# Patient Record
Sex: Male | Born: 1945 | Race: White | Hispanic: No | Marital: Married | Smoking: Never smoker
Health system: Southern US, Community
[De-identification: ages and names within clinical notes are randomized; demographics above are authoritative.]

## PROBLEM LIST (undated history)

## (undated) DIAGNOSIS — A809 Acute poliomyelitis, unspecified: Secondary | ICD-10-CM

## (undated) DIAGNOSIS — I2510 Atherosclerotic heart disease of native coronary artery without angina pectoris: Secondary | ICD-10-CM

## (undated) DIAGNOSIS — C679 Malignant neoplasm of bladder, unspecified: Secondary | ICD-10-CM

## (undated) DIAGNOSIS — I4729 Other ventricular tachycardia: Secondary | ICD-10-CM

## (undated) DIAGNOSIS — K219 Gastro-esophageal reflux disease without esophagitis: Secondary | ICD-10-CM

## (undated) DIAGNOSIS — G589 Mononeuropathy, unspecified: Secondary | ICD-10-CM

## (undated) DIAGNOSIS — J302 Other seasonal allergic rhinitis: Secondary | ICD-10-CM

## (undated) DIAGNOSIS — I10 Essential (primary) hypertension: Secondary | ICD-10-CM

## (undated) DIAGNOSIS — I499 Cardiac arrhythmia, unspecified: Secondary | ICD-10-CM

## (undated) DIAGNOSIS — Z87442 Personal history of urinary calculi: Secondary | ICD-10-CM

## (undated) DIAGNOSIS — R1013 Epigastric pain: Secondary | ICD-10-CM

## (undated) DIAGNOSIS — N529 Male erectile dysfunction, unspecified: Secondary | ICD-10-CM

## (undated) DIAGNOSIS — E669 Obesity, unspecified: Secondary | ICD-10-CM

## (undated) DIAGNOSIS — M7551 Bursitis of right shoulder: Secondary | ICD-10-CM

## (undated) DIAGNOSIS — L989 Disorder of the skin and subcutaneous tissue, unspecified: Secondary | ICD-10-CM

## (undated) DIAGNOSIS — E785 Hyperlipidemia, unspecified: Secondary | ICD-10-CM

## (undated) DIAGNOSIS — M109 Gout, unspecified: Secondary | ICD-10-CM

## (undated) DIAGNOSIS — R351 Nocturia: Secondary | ICD-10-CM

## (undated) DIAGNOSIS — Z951 Presence of aortocoronary bypass graft: Secondary | ICD-10-CM

## (undated) DIAGNOSIS — N200 Calculus of kidney: Secondary | ICD-10-CM

## (undated) DIAGNOSIS — K449 Diaphragmatic hernia without obstruction or gangrene: Secondary | ICD-10-CM

## (undated) DIAGNOSIS — M5136 Other intervertebral disc degeneration, lumbar region: Secondary | ICD-10-CM

## (undated) DIAGNOSIS — I4891 Unspecified atrial fibrillation: Secondary | ICD-10-CM

## (undated) DIAGNOSIS — M51369 Other intervertebral disc degeneration, lumbar region without mention of lumbar back pain or lower extremity pain: Secondary | ICD-10-CM

## (undated) DIAGNOSIS — I472 Ventricular tachycardia: Secondary | ICD-10-CM

## (undated) DIAGNOSIS — I480 Paroxysmal atrial fibrillation: Secondary | ICD-10-CM

## (undated) HISTORY — DX: Mononeuropathy, unspecified: G58.9

## (undated) HISTORY — DX: Epigastric pain: R10.13

## (undated) HISTORY — PX: SHOULDER SURGERY: SHX246

## (undated) HISTORY — DX: Male erectile dysfunction, unspecified: N52.9

## (undated) HISTORY — DX: Unspecified atrial fibrillation: I48.91

## (undated) HISTORY — DX: Other ventricular tachycardia: I47.29

## (undated) HISTORY — DX: Nocturia: R35.1

## (undated) HISTORY — DX: Other seasonal allergic rhinitis: J30.2

## (undated) HISTORY — DX: Ventricular tachycardia: I47.2

## (undated) HISTORY — PX: UPPER GI ENDOSCOPY: SHX6162

## (undated) HISTORY — DX: Obesity, unspecified: E66.9

## (undated) HISTORY — DX: Paroxysmal atrial fibrillation: I48.0

## (undated) HISTORY — DX: Diaphragmatic hernia without obstruction or gangrene: K44.9

## (undated) HISTORY — DX: Gout, unspecified: M10.9

## (undated) HISTORY — PX: CARDIAC CATHETERIZATION: SHX172

## (undated) HISTORY — PX: COLONOSCOPY: SHX174

## (undated) HISTORY — PX: EYE SURGERY: SHX253

---

## 1994-12-27 HISTORY — PX: EYE SURGERY: SHX253

## 2000-06-08 ENCOUNTER — Encounter: Admission: RE | Admit: 2000-06-08 | Discharge: 2000-06-08 | Payer: Self-pay | Admitting: Cardiology

## 2000-06-08 ENCOUNTER — Encounter: Payer: Self-pay | Admitting: Cardiology

## 2000-06-09 ENCOUNTER — Ambulatory Visit (HOSPITAL_COMMUNITY): Admission: RE | Admit: 2000-06-09 | Discharge: 2000-06-09 | Payer: Self-pay | Admitting: Cardiology

## 2005-01-21 ENCOUNTER — Ambulatory Visit: Payer: Self-pay | Admitting: Internal Medicine

## 2005-01-27 ENCOUNTER — Ambulatory Visit: Payer: Self-pay | Admitting: Internal Medicine

## 2005-02-01 ENCOUNTER — Ambulatory Visit (HOSPITAL_COMMUNITY): Admission: RE | Admit: 2005-02-01 | Discharge: 2005-02-01 | Payer: Self-pay | Admitting: Internal Medicine

## 2005-06-08 ENCOUNTER — Ambulatory Visit: Payer: Self-pay | Admitting: Internal Medicine

## 2005-10-05 ENCOUNTER — Ambulatory Visit: Payer: Self-pay | Admitting: Internal Medicine

## 2008-11-26 ENCOUNTER — Encounter: Payer: Self-pay | Admitting: Internal Medicine

## 2009-03-04 ENCOUNTER — Encounter: Payer: Self-pay | Admitting: Internal Medicine

## 2010-10-26 ENCOUNTER — Telehealth: Payer: Self-pay | Admitting: Internal Medicine

## 2010-10-26 ENCOUNTER — Ambulatory Visit: Payer: Self-pay | Admitting: Internal Medicine

## 2010-10-26 DIAGNOSIS — K219 Gastro-esophageal reflux disease without esophagitis: Secondary | ICD-10-CM | POA: Insufficient documentation

## 2010-10-26 DIAGNOSIS — I10 Essential (primary) hypertension: Secondary | ICD-10-CM | POA: Insufficient documentation

## 2010-10-26 DIAGNOSIS — G589 Mononeuropathy, unspecified: Secondary | ICD-10-CM | POA: Insufficient documentation

## 2010-10-26 DIAGNOSIS — E785 Hyperlipidemia, unspecified: Secondary | ICD-10-CM | POA: Insufficient documentation

## 2010-10-26 DIAGNOSIS — M109 Gout, unspecified: Secondary | ICD-10-CM | POA: Insufficient documentation

## 2010-10-26 DIAGNOSIS — Z87442 Personal history of urinary calculi: Secondary | ICD-10-CM | POA: Insufficient documentation

## 2010-10-26 LAB — CONVERTED CEMR LAB
ALT: 32 U/L
AST: 35 U/L
Albumin: 3.8 g/dL
Alkaline Phosphatase: 95 U/L
BUN: 18 mg/dL
Basophils Absolute: 0.1 10*3/uL
Basophils Relative: 0.9 %
Bilirubin, Direct: 0.1 mg/dL
CO2: 29 meq/L
Calcium: 8.8 mg/dL
Chloride: 94 meq/L — ABNORMAL LOW
Cholesterol: 172 mg/dL
Creatinine, Ser: 1.3 mg/dL
Direct LDL: 83.9 mg/dL
Eosinophils Absolute: 0.3 10*3/uL
Eosinophils Relative: 4.6 %
GFR calc non Af Amer: 60.13 mL/min
Glucose, Bld: 85 mg/dL
HCT: 38.8 % — ABNORMAL LOW
HDL: 38.1 mg/dL — ABNORMAL LOW
Hemoglobin: 13.8 g/dL
Lymphocytes Relative: 25.6 %
Lymphs Abs: 1.5 10*3/uL
MCHC: 35.6 g/dL
MCV: 89.1 fL
Monocytes Absolute: 0.7 10*3/uL
Monocytes Relative: 12 %
Neutro Abs: 3.2 10*3/uL
Neutrophils Relative %: 56.9 %
Platelets: 281 10*3/uL
Potassium: 3.2 meq/L — ABNORMAL LOW
RBC: 4.36 M/uL
RDW: 15.4 % — ABNORMAL HIGH
Sodium: 138 meq/L
TSH: 2.14 u[IU]/mL
Total Bilirubin: 0.9 mg/dL
Total CHOL/HDL Ratio: 5
Total Protein: 7 g/dL
Triglycerides: 329 mg/dL — ABNORMAL HIGH
Uric Acid, Serum: 8.1 mg/dL — ABNORMAL HIGH
VLDL: 65.8 mg/dL — ABNORMAL HIGH
WBC: 5.7 10*3/uL

## 2010-11-03 ENCOUNTER — Encounter (INDEPENDENT_AMBULATORY_CARE_PROVIDER_SITE_OTHER): Payer: Self-pay | Admitting: *Deleted

## 2011-01-26 NOTE — Letter (Signed)
Summary: Lipid Letter  [facility]  [address] [place], [place] [address]   Phone: [phone]  Fax: [phone]    10/26/2010  Hunter [name] [address] [place], [place]  [address]  Dear Hunter [name]:  We have carefully reviewed your last lipid profile from  and the results are noted below with a summary of recommendations for lipid management.    Cholesterol:       172     Goal: <200   HDL "good" Cholesterol:   57.84     Goal: >40   LDL "bad" Cholesterol:   84     Goal: <130   Triglycerides:       329.0     Goal: <150 WOW        TLC Diet (Therapeutic Lifestyle Change): Saturated Fats & Transfatty acids should be kept < 7% of total calories ***Reduce Saturated Fats Polyunstaurated Fat can be up to 10% of total calories Monounsaturated Fat Fat can be up to 20% of total calories Total Fat should be no greater than 25-35% of total calories Carbohydrates should be 50-60% of total calories Protein should be approximately 15% of total calories Fiber should be at least 20-30 grams a day ***Increased fiber may help lower LDL Total Cholesterol should be < 200mg /day Consider adding plant stanol/sterols to diet (example: Benacol spread) ***A higher intake of unsaturated fat may reduce Triglycerides and Increase HDL    Adjunctive Measures (may lower LIPIDS and reduce risk of Heart Attack) include: Aerobic Exercise (20-30 minutes 3-4 times a week) Limit Alcohol Consumption Weight Reduction Aspirin 75-81 mg a day by mouth (if not allergic or contraindicated) Dietary Fiber 20-30 grams a day by mouth     Current Medications: 1)    Bystolic 10 Mg Tabs (Nebivolol hcl) .... Take 1 tablet by mouth once a day 2)    Simvastatin 20 Mg Tabs (Simvastatin) .... Take 1 tablet by mouth once a day 3)    Triamterene-hctz 37.5-25 Mg Tabs (Triamterene-hctz) .... Take 1 tablet by mouth once a day 4)    Omeprazole 20 Mg Cpdr (Omeprazole) .Marland Kitchen.. 1 once daily 5)    Allopurinol 100 Mg  Tabs (Allopurinol) .Marland Kitchen.. 1 once daily 6)    Aspirin 81 Mg Chew (Aspirin) .Marland Kitchen.. 1 once daily 7)    Androgel Pump 20.25 Mg/act (1.62%) Gel (Testosterone) .... 2 pumps once daily  If you have any questions, please call. We appreciate being able to work with you.   Sincerely,    [facility]

## 2011-01-26 NOTE — Letter (Signed)
Summary: [employer] Consult Scheduled Letter  [facility]  [address] [place], [place] [address]   Phone: [phone]  Fax: [phone]      11/03/2010 MRN: [id number]  [facility] [address] [place], [place]  [address]    Dear Mr. CANIGLIA,      We have scheduled an appointment for you.  At the recommendation of Dr Yetta Barre, we have scheduled you a consult with DR Terrace Arabia on 01/20/11 at 10:00am.  Their phone number is [phone].  If this appointment day and time is not convenient for you, please feel free to call the office of the doctor you are being referred to at the number listed above and reschedule the appointment.    [facility] [address] [place], [place] [address]  *Please arrive 30 minutes prior to your appointment time*   Enclosure:Directions   Thank you,  Patient Care Coordinator [facility]

## 2011-01-26 NOTE — Progress Notes (Signed)
       New/Updated Medications: OMEPRAZOLE 20 MG CPDR (OMEPRAZOLE) 1 once daily ALLOPURINOL 100 MG TABS (ALLOPURINOL) 1 once daily ASPIRIN 81 MG CHEW (ASPIRIN) 1 once daily

## 2011-01-26 NOTE — Letter (Signed)
Summary: Results Follow-up Letter  [facility]  [address] [place], [place] [address]   Phone: [phone]  Fax: [phone]    10/26/2010  [address] [place], [place]  [address]  Dear Hunter [name],   The following are the results of your recent test(s):  Test     Result     Potassium     low Kidney/liver   normal CBC       mildly anemic Gout level     high Thyroid     normal  _________________________________________________________  Please call for an appointment soon _________________________________________________________ _________________________________________________________ _________________________________________________________  Sincerely,  Sanda Linger MD  [facility]

## 2011-01-26 NOTE — Assessment & Plan Note (Signed)
Summary: New / Cpx / bcbs / cd   Vital Signs:  Patient profile:   65 year old male Height:      70 inches Weight:      210 pounds BMI:     30.24 O2 Sat:      96 % on Room air Temp:     98.2 degrees F oral Pulse rate:   63 / minute Pulse rhythm:   regular Resp:     16 per minute BP sitting:   120 / 78  (left arm) Cuff size:   regular  Vitals Entered By: Rock Nephew CMA (October 26, 2010 10:44 AM)  Nutrition Counseling: Patient's BMI is greater than 25 and therefore counseled on weight management options.  O2 Flow:  Room air  Primary Care Provider:  Yetta Barre   History of Present Illness: New to me this gentleman comes in for a complete physical but he also complains of a 2 month hx. of weakness and atrophy in his left calf and he worries that he may be having a late life complication of polio that affected him when he was about 65 years old. He plays golf and he feels like this has hampered his golf swing. He says that he has "no muscles" in his left arm and it has not worsened.  Preventive Screening-Counseling & Management  Alcohol-Tobacco     Alcohol drinks/day: <1     Alcohol type: all     >5/day in last 3 mos: no     Alcohol Counseling: not indicated; use of alcohol is not excessive or problematic     Feels need to cut down: no     Feels annoyed by complaints: no     Feels guilty re: drinking: no     Needs 'eye opener' in am: no     Smoking Status: never     Tobacco Counseling: not indicated; no tobacco use  Caffeine-Diet-Exercise     Does Patient Exercise: yes  Hep-HIV-STD-Contraception     Hepatitis Risk: no risk noted     HIV Risk: no risk noted     STD Risk: no risk noted     Dental Visit-last 6 months yes     Dental Care Counseling: not indicated; dental care within six months     TSE monthly: yes     Testicular SE Education/Counseling to perform regular STE     Sun Exposure-Excessive: yes     Sun Exposure Counseling: to decrease sun exposure      Sexual  History:  currently monogamous.        Drug Use:  no.        Blood Transfusions:  no.    Medications Prior to Update: 1)  None  Current Medications (verified): 1)  Bystolic 10 Mg Tabs (Nebivolol Hcl) .... Take 1 Tablet By Mouth Once A Day 2)  Simvastatin 20 Mg Tabs (Simvastatin) .... Take 1 Tablet By Mouth Once A Day 3)  Triamterene-Hctz 37.5-25 Mg Tabs (Triamterene-Hctz) .... Take 1 Tablet By Mouth Once A Day  Allergies (verified): No Known Drug Allergies  Past History:  Family History: Last updated: 10/26/2010 Family History of Colon CA 1st degree relative <60 Family History Hypertension Family History of CAD Male 1st degree relative <50  Social History: Last updated: 10/26/2010 Retired Married Never Smoked Alcohol use-yes Drug use-no Regular exercise-yes  Risk Factors: Alcohol Use: <1 (10/26/2010) >5 drinks/d w/in last 3 months: no (10/26/2010) Exercise: yes (10/26/2010)  Risk Factors: Smoking Status: never (  10/26/2010)  Past Medical History: GERD Gout Hyperlipidemia Hypertension- Dr. Elsie Lincoln, EKG was normal August 2011 by pt's report Nephrolithiasis, hx of polio at 38 yoa Low testosterone- Dr. Vernie Ammons, PSA was normal in Sept. 2011 by pt's report  Past Surgical History: Denies surgical history  Family History: Family History of Colon CA 1st degree relative <60 Family History Hypertension Family History of CAD Male 1st degree relative <50  Social History: Retired Married Never Smoked Alcohol use-yes Drug use-no Regular exercise-yes Smoking Status:  never Drug Use:  no Does Patient Exercise:  yes Sun Exposure-Excessive:  yes Hepatitis Risk:  no risk noted HIV Risk:  no risk noted STD Risk:  no risk noted Dental Care w/in 6 mos.:  yes Sexual History:  currently monogamous Blood Transfusions:  no  Review of Systems       The patient complains of weight gain and difficulty walking.  The patient denies anorexia, fever, weight loss, chest pain,  syncope, dyspnea on exertion, peripheral edema, prolonged cough, headaches, hemoptysis, abdominal pain, melena, hematochezia, severe indigestion/heartburn, hematuria, suspicious skin lesions, depression, enlarged lymph nodes, angioedema, and testicular masses.   GU:  Denies decreased libido, discharge, dysuria, erectile dysfunction, hematuria, incontinence, urinary frequency, and urinary hesitancy. MS:  Denies joint pain, joint redness, joint swelling, loss of strength, muscle aches, and cramps. Neuro:  Complains of weakness; denies brief paralysis, difficulty with concentration, disturbances in coordination, falling down, headaches, inability to speak, numbness, poor balance, seizures, sensation of room spinning, tingling, tremors, and visual disturbances.  Physical Exam  General:  alert, well-developed, well-nourished, well-hydrated, appropriate dress, normal appearance, healthy-appearing, cooperative to examination, good hygiene, and overweight-appearing.   Head:  normocephalic, atraumatic, no abnormalities observed, and no abnormalities palpated.   Mouth:  Oral mucosa and oropharynx without lesions or exudates.  Teeth in good repair. Neck:  supple, full ROM, no masses, no thyromegaly, no thyroid nodules or tenderness, no JVD, normal carotid upstroke, no carotid bruits, no cervical lymphadenopathy, and no neck tenderness.   Lungs:  normal respiratory effort, no intercostal retractions, no accessory muscle use, normal breath sounds, no dullness, no fremitus, no crackles, and no wheezes.   Heart:  normal rate, regular rhythm, no murmur, no gallop, no rub, and no JVD.   Abdomen:  soft, non-tender, normal bowel sounds, no distention, no masses, no guarding, no rigidity, no rebound tenderness, no abdominal hernia, no inguinal hernia, and no splenomegaly.   Rectal:  No external abnormalities noted. Normal sphincter tone. No rectal masses or tenderness. Genitalia:  uncircumcised, no hydrocele, no  varicocele, no scrotal masses, no cutaneous lesions, no urethral discharge, R testes atrophic, and L testes atrophic.   Prostate:  no nodules, no asymmetry, no induration, and 1+ enlarged.   Msk:  normal ROM, no joint tenderness, no joint swelling, no joint warmth, no redness over joints, no joint deformities, no joint instability, and no crepitation.   Pulses:  R and L carotid,radial,femoral,dorsalis pedis and posterior tibial pulses are full and equal bilaterally Extremities:  No clubbing, cyanosis, edema, or deformity noted with normal full range of motion of all joints.   Neurologic:  LUE hyporeflexia, LUE weakness and severe atrophy, LLE hyporeflexia, and LLE weakness and mild atrophy.  alert & oriented X3, cranial nerves II-XII intact, sensation intact to pinprick, gait normal, LUE hyporeflexia, LUE weakness, LLE hyporeflexia, and LLE weakness.   Skin:  turgor normal, no rashes, no suspicious lesions, no ecchymoses, no petechiae, no purpura, no ulcerations, no edema, excessive tan, and solar damage.  Cervical Nodes:  no anterior cervical adenopathy and no posterior cervical adenopathy.   Axillary Nodes:  no R axillary adenopathy and no L axillary adenopathy.   Inguinal Nodes:  no R inguinal adenopathy and no L inguinal adenopathy.   Psych:  Cognition and judgment appear intact. Alert and cooperative with normal attention span and concentration. No apparent delusions, illusions, hallucinations   Impression & Recommendations:  Problem # 1:  ROUTINE GENERAL MEDICAL EXAM@HEALTH  CARE FACL (ICD-V70.0) Assessment New  Orders: Venipuncture ([address]) TLB-Lipid Panel (80061-LIPID) TLB-BMP (Basic Metabolic Panel-BMET) (80048-METABOL) TLB-CBC Platelet - w/Differential (85025-CBCD) TLB-Hepatic/Liver Function Pnl (80076-HEPATIC) TLB-TSH (Thyroid Stimulating Hormone) (84443-TSH) TLB-Uric Acid, Blood (84550-URIC)  Colonoscopy: Normal (10/03/2007) Td Booster: Historical (06/30/2005)    Discussed  using sunscreen, use of alcohol, drug use, self testicular exam, routine dental care, routine eye care, routine physical exam, seat belts, multiple vitamins, and recommendations for immunizations.  Discussed exercise and checking cholesterol.  Also recommend checking PSA.  Problem # 2:  HYPERTENSION (ICD-401.9) Assessment: Improved  His updated medication list for this problem includes:    Bystolic 10 Mg Tabs (Nebivolol hcl) .Marland Kitchen... Take 1 tablet by mouth once a day    Triamterene-hctz 37.5-25 Mg Tabs (Triamterene-hctz) .Marland Kitchen... Take 1 tablet by mouth once a day  Orders: Venipuncture ([address]) TLB-Lipid Panel (80061-LIPID) TLB-BMP (Basic Metabolic Panel-BMET) (80048-METABOL) TLB-CBC Platelet - w/Differential (85025-CBCD) TLB-Hepatic/Liver Function Pnl (80076-HEPATIC) TLB-TSH (Thyroid Stimulating Hormone) (84443-TSH) TLB-Uric Acid, Blood (84550-URIC)  BP today: 120/78  Problem # 3:  MONONEURITIS OF UNSPECIFIED SITE (ICD-355.9) Assessment: New  Orders: Neurology Referral (Neuro)  Problem # 4:  GOUT (ICD-274.9) Assessment: Unchanged  Orders: Venipuncture ([address]) TLB-Lipid Panel (80061-LIPID) TLB-BMP (Basic Metabolic Panel-BMET) (80048-METABOL) TLB-CBC Platelet - w/Differential (85025-CBCD) TLB-Hepatic/Liver Function Pnl (80076-HEPATIC) TLB-TSH (Thyroid Stimulating Hormone) (84443-TSH) TLB-Uric Acid, Blood (84550-URIC)  Elevate extremity; warm compresses, symptomatic relief and medication as directed.   Complete Medication List: 1)  Bystolic 10 Mg Tabs (Nebivolol hcl) .... Take 1 tablet by mouth once a day 2)  Simvastatin 20 Mg Tabs (Simvastatin) .... Take 1 tablet by mouth once a day 3)  Triamterene-hctz 37.5-25 Mg Tabs (Triamterene-hctz) .... Take 1 tablet by mouth once a day  Colorectal Screening:  Current Recommendations:    Hemoccult: NEG X 1 today; Given X 3  Colonoscopy Results:    Date of Exam: 10/03/2007    Results: Normal  PSA Screening:    Reviewed PSA screening  recommendations: Pro's and Cons's of PSA discussed and patient chooses to defer  Immunization & Chemoprophylaxis:    Tetanus vaccine: Historical  (06/30/2005)  Patient Instructions: 1)  Please schedule a follow-up appointment in 4 months. 2)  It is important that you exercise regularly at least 20 minutes 5 times a week. If you develop chest pain, have severe difficulty breathing, or feel very tired , stop exercising immediately and seek medical attention. 3)  You need to lose weight. Consider a lower calorie diet and regular exercise.  4)  Check your Blood Pressure regularly. If it is above 140/90: you should make an appointment.   Orders Added: 1)  Venipuncture [[address]] 2)  TLB-Lipid Panel [80061-LIPID] 3)  TLB-BMP (Basic Metabolic Panel-BMET) [80048-METABOL] 4)  TLB-CBC Platelet - w/Differential [85025-CBCD] 5)  TLB-Hepatic/Liver Function Pnl [80076-HEPATIC] 6)  TLB-TSH (Thyroid Stimulating Hormone) [84443-TSH] 7)  TLB-Uric Acid, Blood [84550-URIC] 8)  Neurology Referral [Neuro] 9)  New Patient 65&> [91478] 10)  New Patient Level III [29[address]]   Immunization History:  Tetanus/Td Immunization History:    Tetanus/Td:  historical (06/30/2005)  Immunization History:  Tetanus/Td Immunization History:    Tetanus/Td:  Historical (06/30/2005)

## 2011-01-26 NOTE — Progress Notes (Signed)
       New/Updated Medications: ANDROGEL PUMP 20.25 MG/ACT (1.62%) GEL (TESTOSTERONE) 2 pumps once daily

## 2011-01-26 NOTE — Letter (Signed)
Summary: [facility]  [facility]   Imported By: Lanelle Bal 03/12/2009 11:50:52  _____________________________________________________________________  External Attachment:    Type:   Image     Comment:   External Document

## 2011-05-14 NOTE — Cardiovascular Report (Signed)
[facility]  Patient:    Hunter [name], Hunter [name]                      MRN: [id number] Proc. Date: 06/09/00 Adm. Date:  [id number] Disc. Date: [id number] Attending:  Ophelia Shoulder CC:         Madaline Savage, M.D.             Kerry Kass, M.D. [facility]             Cardiac Catheterization Laboratory                        Cardiac Catheterization  PROCEDURES PERFORMED: 1. Selective coronary angiography by Judkins technique. 2. Retrograde left heart catheterization. 3. Left ventricular angiography.  COMPLICATIONS:  None.  ENTRY SITE:  Right femoral.  DYE USED:  Omnipaque.  PATIENT PROFILE:  The patient is a 65 year old gentleman who has recently had episodes of effort angina relieved with rest.  He had a nuclear medicine stress test showing infra-apical reversible hypokinesis that was mild. Todays outpatient cardiac catheterization was scheduled electively.  RESULTS:  PRESSURES:  The left ventricular pressure was 130/14, the central aortic pressure 130/75, mean of 100.  No aortic valve gradient by pullback technique.   ANGIOGRAPHIC RESULTS:  The left main coronary artery was patent.  The left anterior descending coronary artery was irregular in the proximal portion and in the first part of the mid segment near the origin of the first diagonal branch.  Nonobstructive luminal irregularities were noted.  The vessel in the midportion and distally is smooth and unremarkable in appearance.  The patient has a 30% proximal eccentric stenosis and a cranially angulated LAO view.  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  The left circumflex is nondominant.  No lesions are seen.  The right coronary artery contains luminal irregularity in the proximal and midportion.  Nothing of an obstructive nature.  LEFT VENTRICULOGRAM:  The left ventricle shows normal contractility with no wall motion  abnormalities.  FINAL DIAGNOSES: 1. Mild luminal irregularities in the proximal and mid left anterior    descending coronary artery and mid right coronary artery.  No high-grade    obstructive lesions noted. 2. Normal left ventricular systolic function.  PLAN:  We should stress blood pressure control, exercise, diet, and avoidance of tobacco. DD:  06/09/00 TD:  06/13/00 Job: [id number]

## 2012-11-26 DIAGNOSIS — I2510 Atherosclerotic heart disease of native coronary artery without angina pectoris: Secondary | ICD-10-CM

## 2012-11-26 HISTORY — DX: Atherosclerotic heart disease of native coronary artery without angina pectoris: I25.10

## 2012-12-01 ENCOUNTER — Other Ambulatory Visit: Payer: Self-pay | Admitting: Cardiovascular Disease

## 2012-12-01 ENCOUNTER — Ambulatory Visit
Admission: RE | Admit: 2012-12-01 | Discharge: 2012-12-01 | Disposition: A | Discharge status: Home / Self Care | Payer: Medicare Other | Source: Ambulatory Visit | Attending: Cardiovascular Disease | Admitting: Cardiovascular Disease

## 2012-12-01 DIAGNOSIS — Z01811 Encounter for preprocedural respiratory examination: Secondary | ICD-10-CM

## 2012-12-01 DIAGNOSIS — I10 Essential (primary) hypertension: Secondary | ICD-10-CM

## 2012-12-05 ENCOUNTER — Encounter (HOSPITAL_COMMUNITY): Payer: Self-pay | Admitting: Respiratory Therapy

## 2012-12-06 ENCOUNTER — Other Ambulatory Visit: Payer: Self-pay | Admitting: Cardiovascular Disease

## 2012-12-08 ENCOUNTER — Encounter (HOSPITAL_COMMUNITY): Payer: Self-pay

## 2012-12-08 ENCOUNTER — Other Ambulatory Visit (HOSPITAL_COMMUNITY): Payer: Self-pay | Admitting: Radiology

## 2012-12-08 ENCOUNTER — Ambulatory Visit (HOSPITAL_COMMUNITY)
Admission: RE | Admit: 2012-12-08 | Discharge: 2012-12-08 | Disposition: A | Discharge status: Home / Self Care | Payer: Medicare Other | Source: Ambulatory Visit | Attending: Cardiovascular Disease | Admitting: Cardiovascular Disease

## 2012-12-08 ENCOUNTER — Other Ambulatory Visit: Payer: Self-pay | Admitting: *Deleted

## 2012-12-08 ENCOUNTER — Encounter (HOSPITAL_COMMUNITY): Payer: Self-pay | Admitting: Thoracic Surgery (Cardiothoracic Vascular Surgery)

## 2012-12-08 ENCOUNTER — Ambulatory Visit (HOSPITAL_COMMUNITY)
Admission: RE | Admit: 2012-12-08 | Discharge: 2012-12-08 | Disposition: A | Discharge status: Home / Self Care | Payer: Medicare Other | Source: Ambulatory Visit | Attending: Thoracic Surgery (Cardiothoracic Vascular Surgery) | Admitting: Thoracic Surgery (Cardiothoracic Vascular Surgery)

## 2012-12-08 ENCOUNTER — Encounter (HOSPITAL_COMMUNITY)
Admission: RE | Disposition: A | Discharge status: Home / Self Care | Payer: Self-pay | Source: Ambulatory Visit | Attending: Cardiovascular Disease

## 2012-12-08 ENCOUNTER — Encounter (HOSPITAL_COMMUNITY)
Admission: RE | Admit: 2012-12-08 | Discharge: 2012-12-08 | Disposition: A | Discharge status: Home / Self Care | Payer: Medicare Other | Source: Ambulatory Visit | Attending: Thoracic Surgery (Cardiothoracic Vascular Surgery) | Admitting: Thoracic Surgery (Cardiothoracic Vascular Surgery)

## 2012-12-08 ENCOUNTER — Inpatient Hospital Stay (HOSPITAL_COMMUNITY)
Admit: 2012-12-08 | Discharge: 2012-12-08 | Disposition: A | Discharge status: Home / Self Care | Payer: Medicare Other | Attending: Thoracic Surgery (Cardiothoracic Vascular Surgery) | Admitting: Thoracic Surgery (Cardiothoracic Vascular Surgery)

## 2012-12-08 VITALS — BP 121/79 | HR 77 | Temp 97.9°F | Resp 20 | Ht 68.0 in | Wt 207.9 lb

## 2012-12-08 DIAGNOSIS — Z01810 Encounter for preprocedural cardiovascular examination: Secondary | ICD-10-CM

## 2012-12-08 DIAGNOSIS — I2510 Atherosclerotic heart disease of native coronary artery without angina pectoris: Secondary | ICD-10-CM

## 2012-12-08 DIAGNOSIS — I10 Essential (primary) hypertension: Secondary | ICD-10-CM | POA: Insufficient documentation

## 2012-12-08 HISTORY — DX: Hyperlipidemia, unspecified: E78.5

## 2012-12-08 HISTORY — DX: Gastro-esophageal reflux disease without esophagitis: K21.9

## 2012-12-08 HISTORY — DX: Acute poliomyelitis, unspecified: A80.9

## 2012-12-08 HISTORY — DX: Essential (primary) hypertension: I10

## 2012-12-08 HISTORY — DX: Disorder of the skin and subcutaneous tissue, unspecified: L98.9

## 2012-12-08 HISTORY — DX: Calculus of kidney: N20.0

## 2012-12-08 HISTORY — DX: Other intervertebral disc degeneration, lumbar region without mention of lumbar back pain or lower extremity pain: M51.369

## 2012-12-08 HISTORY — PX: LEFT HEART CATHETERIZATION WITH CORONARY ANGIOGRAM: SHX5451

## 2012-12-08 HISTORY — DX: Bursitis of right shoulder: M75.51

## 2012-12-08 HISTORY — DX: Other intervertebral disc degeneration, lumbar region: M51.36

## 2012-12-08 LAB — BLOOD GAS, ARTERIAL
Acid-Base Excess: 0.8 mmol/L (ref 0.0–2.0)
Bicarbonate: 24.7 mEq/L — ABNORMAL HIGH (ref 20.0–24.0)
Drawn by: 24231
FIO2: 0.21 %
O2 Saturation: 97.2 %
Patient temperature: 98.6
TCO2: 25.9 mmol/L (ref 0–100)
pCO2 arterial: 38.1 mmHg (ref 35.0–45.0)
pH, Arterial: 7.428 (ref 7.350–7.450)
pO2, Arterial: 87.9 mmHg (ref 80.0–100.0)

## 2012-12-08 LAB — COMPREHENSIVE METABOLIC PANEL
ALT: 21 U/L (ref 0–53)
AST: 21 U/L (ref 0–37)
Albumin: 3.8 g/dL (ref 3.5–5.2)
Alkaline Phosphatase: 89 U/L (ref 39–117)
BUN: 17 mg/dL (ref 6–23)
CO2: 27 mEq/L (ref 19–32)
Calcium: 9.5 mg/dL (ref 8.4–10.5)
Chloride: 99 mEq/L (ref 96–112)
Creatinine, Ser: 1.23 mg/dL (ref 0.50–1.35)
GFR calc Af Amer: 69 mL/min — ABNORMAL LOW (ref >90)
GFR calc non Af Amer: 59 mL/min — ABNORMAL LOW (ref >90)
Glucose, Bld: 83 mg/dL (ref 70–99)
Potassium: 3.5 mEq/L (ref 3.5–5.1)
Sodium: 138 mEq/L (ref 135–145)
Total Bilirubin: 0.3 mg/dL (ref 0.3–1.2)
Total Protein: 7.2 g/dL (ref 6.0–8.3)

## 2012-12-08 LAB — SURGICAL PCR SCREEN
MRSA, PCR: NEGATIVE
Staphylococcus aureus: POSITIVE — AB

## 2012-12-08 LAB — URINALYSIS, ROUTINE W REFLEX MICROSCOPIC
Bilirubin Urine: NEGATIVE
Glucose, UA: NEGATIVE mg/dL
Hgb urine dipstick: NEGATIVE
Ketones, ur: NEGATIVE mg/dL
Leukocytes, UA: NEGATIVE
Nitrite: NEGATIVE
Protein, ur: NEGATIVE mg/dL
Specific Gravity, Urine: 1.01 (ref 1.005–1.030)
Urobilinogen, UA: 0.2 mg/dL (ref 0.0–1.0)
pH: 5.5 (ref 5.0–8.0)

## 2012-12-08 LAB — CBC
HCT: 46.1 % (ref 39.0–52.0)
Hemoglobin: 15.7 g/dL (ref 13.0–17.0)
MCH: 30.2 pg (ref 26.0–34.0)
MCHC: 34.1 g/dL (ref 30.0–36.0)
MCV: 88.7 fL (ref 78.0–100.0)
Platelets: 184 10*3/uL (ref 150–400)
RBC: 5.2 MIL/uL (ref 4.22–5.81)
RDW: 13.6 % (ref 11.5–15.5)
WBC: 8.1 10*3/uL (ref 4.0–10.5)

## 2012-12-08 LAB — BASIC METABOLIC PANEL
BUN: 20 mg/dL (ref 6–23)
CO2: 29 mEq/L (ref 19–32)
Calcium: 9.5 mg/dL (ref 8.4–10.5)
Chloride: 99 mEq/L (ref 96–112)
Creatinine, Ser: 1.22 mg/dL (ref 0.50–1.35)
GFR calc Af Amer: 70 mL/min — ABNORMAL LOW (ref >90)
GFR calc non Af Amer: 60 mL/min — ABNORMAL LOW (ref >90)
Glucose, Bld: 109 mg/dL — ABNORMAL HIGH (ref 70–99)
Potassium: 3.3 mEq/L — ABNORMAL LOW (ref 3.5–5.1)
Sodium: 139 mEq/L (ref 135–145)

## 2012-12-08 LAB — PULMONARY FUNCTION TEST

## 2012-12-08 LAB — PROTIME-INR
INR: 0.96 (ref 0.00–1.49)
Prothrombin Time: 12.7 seconds (ref 11.6–15.2)

## 2012-12-08 LAB — APTT: aPTT: 31 seconds (ref 24–37)

## 2012-12-08 LAB — TYPE AND SCREEN
ABO/RH(D): O POS
Antibody Screen: NEGATIVE

## 2012-12-08 LAB — ABO/RH: ABO/RH(D): O POS

## 2012-12-08 LAB — HEMOGLOBIN A1C
Hgb A1c MFr Bld: 5.6 % (ref <5.7)
Mean Plasma Glucose: 114 mg/dL (ref <117)

## 2012-12-08 SURGERY — LEFT HEART CATHETERIZATION WITH CORONARY ANGIOGRAM
Anesthesia: LOCAL

## 2012-12-08 MED ORDER — CHLORHEXIDINE GLUCONATE 4 % EX LIQD: 30.0000 mL | CUTANEOUS | Status: DC

## 2012-12-08 MED ORDER — NITROGLYCERIN 0.2 MG/ML ON CALL CATH LAB
INTRAVENOUS | Status: AC
  Filled 2012-12-08: qty 1

## 2012-12-08 MED ORDER — LIDOCAINE HCL (PF) 1 % IJ SOLN
INTRAMUSCULAR | Status: AC
  Filled 2012-12-08: qty 30

## 2012-12-08 MED ORDER — HEPARIN (PORCINE) IN NACL 2-0.9 UNIT/ML-% IJ SOLN
INTRAMUSCULAR | Status: AC
  Filled 2012-12-08: qty 1000

## 2012-12-08 MED ORDER — FENTANYL CITRATE 0.05 MG/ML IJ SOLN
INTRAMUSCULAR | Status: AC
  Filled 2012-12-08: qty 2

## 2012-12-08 MED ORDER — MIDAZOLAM HCL 2 MG/2ML IJ SOLN
INTRAMUSCULAR | Status: AC
  Filled 2012-12-08: qty 2

## 2012-12-08 MED ORDER — ASPIRIN 81 MG PO CHEW
324.0000 mg | CHEWABLE_TABLET | ORAL | Status: AC
  Administered 2012-12-08: 324 mg via ORAL
  Filled 2012-12-08: qty 4

## 2012-12-08 MED ORDER — SODIUM CHLORIDE 0.9 % IV SOLN: 1.0000 mL/kg/h | INTRAVENOUS | Status: DC

## 2012-12-08 MED ORDER — POTASSIUM CHLORIDE CRYS ER 20 MEQ PO TBCR
40.0000 meq | EXTENDED_RELEASE_TABLET | Freq: Once | ORAL | Status: AC
  Administered 2012-12-08: 40 meq via ORAL
  Filled 2012-12-08: qty 2

## 2012-12-08 MED ORDER — SODIUM CHLORIDE 0.9 % IJ SOLN: 3.0000 mL | INTRAMUSCULAR | Status: DC | PRN

## 2012-12-08 MED ORDER — METOPROLOL TARTRATE 25 MG PO TABS: 12.5000 mg | ORAL_TABLET | Freq: Two times a day (BID) | ORAL | Status: DC

## 2012-12-08 MED ORDER — ACETAMINOPHEN 325 MG PO TABS: 650.0000 mg | ORAL_TABLET | ORAL | Status: DC | PRN

## 2012-12-08 MED ORDER — SODIUM CHLORIDE 0.9 % IV SOLN
INTRAVENOUS | Status: DC
  Administered 2012-12-08: 08:00:00 via INTRAVENOUS

## 2012-12-08 MED ORDER — ONDANSETRON HCL 4 MG/2ML IJ SOLN: 4.0000 mg | Freq: Four times a day (QID) | INTRAMUSCULAR | Status: DC | PRN

## 2012-12-08 NOTE — Consult Note (Signed)
Reason for Consult:3 vessel CAD Referring Physician: Dr. Croitoru  Hunter [name] is an 66 y.o. male.  HPI: 66 yo WM presents with cc/o chest pain.  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 He presented with a c/o epigastric and upper abdominal pain. This was atypical and variably associated with exertion. He had a nuclear treadmill which showed reversible ischemia in the inferior and anteroapical territories. EF was 51%. He says that he has been feeling warm but not sweating when he wakes up in the morning.  Past Medical History  Diagnosis Date  . Polio     left arm atrophy  . Hypertension   . Hyperlipidemia     Past Surgical History  Procedure Date  . Shoulder surgery     for effects of polio    Family History  Problem Relation Age of Onset  . CAD Father     Social History:  reports that he has never smoked. He does not have any smokeless tobacco history on file. His alcohol and drug histories not on file.  Allergies: No Known Allergies  Medications:  Prior to Admission:  Prescriptions prior to admission  Medication Sig Dispense Refill  . allopurinol (ZYLOPRIM) 300 MG tablet Take 300 mg by mouth daily.      . aspirin 81 MG tablet Take 81 mg by mouth daily.      . fish oil-omega-3 fatty acids 1000 MG capsule Take 1 g by mouth daily.      . niacin 500 MG tablet Take 500 mg by mouth daily with breakfast.      . omeprazole (PRILOSEC) 20 MG capsule Take 20 mg by mouth daily.      . simvastatin (ZOCOR) 40 MG tablet Take 40 mg by mouth every morning.      . triamterene-hydrochlorothiazide (MAXZIDE-25) 37.5-25 MG per tablet Take 1 tablet by mouth daily.        Results for orders placed during the hospital encounter of 12/08/12 (from the past 48 hour(s))  BASIC METABOLIC PANEL     Status: Abnormal   Collection Time   12/08/12  7:27 AM      Component Value Range Comment   Sodium 139  135 - 145 mEq/L    Potassium 3.3 (*)  3.5 - 5.1 mEq/L    Chloride 99  96 - 112 mEq/L    CO2 29  19 - 32 mEq/L    Glucose, Bld 109 (*) 70 - 99 mg/dL    BUN 20  6 - 23 mg/dL    Creatinine, Ser 1.22  0.50 - 1.35 mg/dL    Calcium 9.5  8.4 - 10.5 mg/dL    GFR calc non Af Amer 60 (*) >90 mL/min    GFR calc Af Amer 70 (*) >90 mL/min     No results found.   Review of Systems  Constitutional: Negative for fever and chills.  Cardiovascular: Positive for chest pain.  Gastrointestinal: Positive for heartburn.  Musculoskeletal: Positive for joint pain (right shoulder).       Atrophy of left UE  Neurological: Positive for focal weakness (left UE).  All other systems reviewed and are negative.   Blood pressure 144/83, pulse 77, temperature 97.7 F (36.5 C), temperature source Oral, resp. rate 18, height 5' 9" (1.753 m), weight 207 lb (93.895 kg), SpO2 95.00%. Physical Exam  Vitals reviewed. Constitutional: He is oriented to person, place, and time. He appears well-developed and well-nourished. No   distress.  HENT:  Head: Normocephalic and atraumatic.  Eyes: EOM are normal. Pupils are equal, round, and reactive to light.  Neck: Neck supple. No thyromegaly present.       No bruits  Cardiovascular: Normal rate, regular rhythm and normal heart sounds.  Exam reveals no gallop and no friction rub.   No murmur heard. Respiratory: Effort normal and breath sounds normal.  GI: Soft. There is no tenderness.  Musculoskeletal: He exhibits no edema.       Atrophy left UE  Lymphadenopathy:    He has no cervical adenopathy.  Neurological: He is alert and oriented to person, place, and time.       Weakness left arm  Skin: Skin is warm and dry.   Cardiac Cath 12/08/12  Symptomatic multivessel severe CAD, with preserved LV systolic function.  RECOMMENDATION:  Refer for CABG.  Mihai Croitoru, MD, FACC   Assessment/Plan: 66 yo with multiple CRF including a strong family history. 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 CABG is indicated for survival benefit and relief of symptoms.   I have discussed with the patient the general nature of the procedure, need for general anesthesia, and the incisions to be used. I discussed the expected hospital stay, overall recovery and short and long term outcomes. He understands the risks include but are not limited to death, stroke, MI, DVT/PE, bleeding, possible need for transfusion, infections, cardiac arrhythmias, and other organ system dysfunction including respiratory, renal, or GI complications. He accepts these risks and agrees to proceed.  We will schedule him for CABG on Tuesday 12/12/2012. He will be admitted on the day of surgery. He was instructed to avoid strenuous exertion. And to call immediately if he has CP lasting longer than a few minutes.  Venita Seng C 12/08/2012, 11:14 AM      

## 2012-12-08 NOTE — CV Procedure (Signed)
Pickart,Dawan Male, 66 y.o., 01/12/1946  Location: [facility]  Bed: NONE  MRN: [id number]  CSN: [id number]  Admit Dt: 12/08/12  CARDIAC CATHETERIZATION REPORT   Procedures performed:  1. Left heart catheterization  2. Selective coronary angiography  3. Left ventriculography   Reason for procedure:  Abnormal nuclear stress test/stress echo  Procedure performed by: Thurmon Fair, MD, [facility]  Complications: none   Estimated blood loss: less than 5 mL   History:  66 year old with known moderate CAD by previous angiography, now with atypical chest pain and abnormal nuclear perfusion scan suggestive of multivessel CAD.  Consent: The risks, benefits, and details of the procedure were explained to the patient. Risks including death, MI, stroke, bleeding, limb ischemia, renal failure and allergy were described and accepted by the patient. Informed written consent was obtained prior to proceeding.  Technique: The patient was brought to the cardiac catheterization laboratory in the fasting state. He was prepped and draped in the usual sterile fashion. Local anesthesia with 1% lidocaine was administered to the right groin area. Using the modified Seldinger technique a 5 French right common femoral artery sheath was introduced without difficulty. Under fluoroscopic guidance, using 5 [place] JL4, JR and angled pigtail catheters, selective cannulation of the left coronary artery, right coronary artery and left ventricle were respectively performed. Several coronary angiograms in a variety of projections were recorded, as well as a left ventriculogram in the RAO projection. Left ventricular pressure and a pull back to the aorta were recorded. No immediate complications occurred. At the end of the procedure, all catheters were removed. After the procedure, hemostasis will be achieved with manual pressure.  Contrast used: 80 mL Omnipaque  Angiographic Findings:  1. The left main coronary artery is free  of significant atherosclerosis and bifurcates in the usual fashion into the left anterior descending artery and left circumflex coronary artery.  2. The left anterior descending artery is a large vessel that reaches the apex and generates only one major diagonal branch. There is evidence of an ulcerated plaque with 40% stenosis in the proximal LAD and there is a 95% stenosis in the mid vessel beyond the diagonal ostium. The remainder of the vessel is relatively free of luminal irregularities and calcification. 3. The left circumflex coronary artery is a large-size vessel non-dominant vessel that generates three major oblique marginal arteries, the proximal one being much smaller. There is evidence of a severe, ulcerated stenosis just beyond the second OM artery takeoff. The left atrial branch and the AV groove vessel provide collaterals to the distal branches of the right coronary artery. 4. The right coronary artery is a relatively small-size, but dominant vessel that has a 95% distal stenosis at the acute margin. There is a small to medium size posterior descending artery and an ostially occluded posterior lateral ventricular artery (which fills during left coronary injections). 5. The left ventricle is normal in size. The left ventricle systolic function is normalwith an estimated ejection fraction of 55-60%. Regional wall motion abnormalities are not seen. No left ventricular thrombus is seen. There is no mitral insufficiency. The ascending aorta appears normal. There is no aortic valve stenosis by pullback. The left ventricular end-diastolic pressure is 11 mm Hg.    IMPRESSIONS:  Symptomatic multivessel severe CAD, with preserved LV systolic function.   RECOMMENDATION:  Refer for CABG.  Thurmon Fair, MD, [facility] [phone] office [phone] pager 12/08/2012

## 2012-12-08 NOTE — Pre-Procedure Instructions (Signed)
20 Hunter [name]  12/08/2012   Your procedure is scheduled on:  Tuesday December 12, 2012  Report to [facility] at 5:30 AM.  Call this number if you have problems the morning of surgery: [phone]   Remember:   Do not eat food or drink :After Midnight.    Take these medicines the morning of surgery with A SIP OF WATER: allopurinol, metoprolol, omeprazole,    Do not wear jewelry, make-up or nail polish.  Do not wear lotions, powders, or perfumes.   Do not shave 48 hours prior to surgery. Men may shave face and neck.  Do not bring valuables to the hospital.  Contacts, dentures or bridgework may not be worn into surgery.  Leave suitcase in the car. After surgery it may be brought to your room.  For patients admitted to the hospital, checkout time is 11:00 AM the day of discharge.   Patients discharged the day of surgery will not be allowed to drive home.  Name and phone number of your driver: family / friend  Special Instructions: Incentive Spirometry - Practice and bring it with you on the day of surgery. Shower using CHG 2 nights before surgery and the night before surgery.  If you shower the day of surgery use CHG.  Use special wash - you have one bottle of CHG for all showers.  You should use approximately 1/3 of the bottle for each shower.   Please read over the following fact sheets that you were given: Pain Booklet, Coughing and Deep Breathing, Blood Transfusion Information, Open Heart Packet, MRSA Information and Surgical Site Infection Prevention

## 2012-12-08 NOTE — Progress Notes (Signed)
Pre-op Cardiac Surgery  Carotid Findings:  No obvious evidence of hemodynamically significant internal carotid artery stenosis. Vertebral arteries are patent with antegrade flow.  Upper Extremity Right Left  Brachial Pressures Triphasic Unable to obtain pressure due to recent arterial puncture 121-Triphasic  Radial Waveforms Triphasic Monophasic  Ulnar Waveforms Monophasic Biphasic  Palmar Arch (Allen's Test) Unable to obtain due to recent arterial puncture Signal obliterates with radial compression, is unaffected with ulnar compression.    Lower  Extremity Right Left  Dorsalis Pedis    Anterior Tibial    Posterior Tibial    Ankle/Brachial Indices      Findings:   Bilateral palpable pedal pulses.  12/08/2012 4:33 PM Gertie Fey, RDMS, RDCS

## 2012-12-08 NOTE — H&P (Signed)
Date of Initial H&P: 12/01/2012  History reviewed, patient examined, no change in status, stable for surgery. Abnormal nuclear stress test suggests multivessel disease in this patient with a history of moderate CAD by previous angiography. Here for heart cath/possible PCI. Thurmon Fair, MD, [facility] [phone] office [phone] pager 12/08/2012 8:06 AM

## 2012-12-11 MED ORDER — DOPAMINE-DEXTROSE 3.2-5 MG/ML-% IV SOLN
2.0000 ug/kg/min | INTRAVENOUS | Status: DC
  Filled 2012-12-11 (×2): qty 250

## 2012-12-11 MED ORDER — METOPROLOL TARTRATE 12.5 MG HALF TABLET: 12.5000 mg | ORAL_TABLET | Freq: Once | ORAL | Status: DC

## 2012-12-11 MED ORDER — PLASMA-LYTE 148 IV SOLN
INTRAVENOUS | Status: AC
  Administered 2012-12-12: 15:00:00
  Filled 2012-12-11: qty 2.5

## 2012-12-11 MED ORDER — POTASSIUM CHLORIDE 2 MEQ/ML IV SOLN
80.0000 meq | INTRAVENOUS | Status: DC
  Filled 2012-12-11: qty 40

## 2012-12-11 MED ORDER — DEXTROSE 5 % IV SOLN
30.0000 ug/min | INTRAVENOUS | Status: DC
  Filled 2012-12-11 (×2): qty 2

## 2012-12-11 MED ORDER — VANCOMYCIN HCL 10 G IV SOLR
1250.0000 mg | INTRAVENOUS | Status: AC
  Administered 2012-12-12: 1250 mg via INTRAVENOUS
  Filled 2012-12-11: qty 1250

## 2012-12-11 MED ORDER — DEXMEDETOMIDINE HCL IN NACL 400 MCG/100ML IV SOLN
0.1000 ug/kg/h | INTRAVENOUS | Status: DC
  Filled 2012-12-11 (×2): qty 100

## 2012-12-11 MED ORDER — EPINEPHRINE HCL 1 MG/ML IJ SOLN
0.5000 ug/min | INTRAVENOUS | Status: DC
  Filled 2012-12-11: qty 4

## 2012-12-11 MED ORDER — SODIUM CHLORIDE 0.9 % IV SOLN
INTRAVENOUS | Status: DC
  Filled 2012-12-11: qty 1

## 2012-12-11 MED ORDER — NITROGLYCERIN IN D5W 200-5 MCG/ML-% IV SOLN
2.0000 ug/min | INTRAVENOUS | Status: DC
  Filled 2012-12-11: qty 250

## 2012-12-11 MED ORDER — MAGNESIUM SULFATE 50 % IJ SOLN
40.0000 meq | INTRAMUSCULAR | Status: DC
  Filled 2012-12-11: qty 10

## 2012-12-11 MED ORDER — DEXTROSE 5 % IV SOLN
1.5000 g | INTRAVENOUS | Status: AC
  Administered 2012-12-12: 1.5 g via INTRAVENOUS
  Administered 2012-12-12: .75 g via INTRAVENOUS
  Filled 2012-12-11 (×2): qty 1.5

## 2012-12-11 MED ORDER — DEXTROSE 5 % IV SOLN
750.0000 mg | INTRAVENOUS | Status: DC
  Filled 2012-12-11 (×2): qty 750

## 2012-12-11 MED ORDER — AMINOCAPROIC ACID 250 MG/ML IV SOLN
INTRAVENOUS | Status: DC
  Filled 2012-12-11: qty 40

## 2012-12-12 ENCOUNTER — Inpatient Hospital Stay (HOSPITAL_COMMUNITY): Payer: Medicare Other

## 2012-12-12 ENCOUNTER — Encounter (HOSPITAL_COMMUNITY): Payer: Self-pay | Admitting: Anesthesiology

## 2012-12-12 ENCOUNTER — Inpatient Hospital Stay (HOSPITAL_COMMUNITY): Payer: Medicare Other | Admitting: Anesthesiology

## 2012-12-12 ENCOUNTER — Inpatient Hospital Stay (HOSPITAL_COMMUNITY)
Admission: RE | Admit: 2012-12-12 | Discharge: 2012-12-21 | DRG: 236 | Disposition: A | Discharge status: Home / Self Care | Payer: Medicare Other | Source: Ambulatory Visit | Attending: Thoracic Surgery (Cardiothoracic Vascular Surgery) | Admitting: Thoracic Surgery (Cardiothoracic Vascular Surgery)

## 2012-12-12 ENCOUNTER — Encounter (HOSPITAL_COMMUNITY)
Admission: RE | Disposition: A | Discharge status: Home / Self Care | Payer: Self-pay | Source: Ambulatory Visit | Attending: Thoracic Surgery (Cardiothoracic Vascular Surgery)

## 2012-12-12 ENCOUNTER — Encounter (HOSPITAL_COMMUNITY): Payer: Self-pay | Admitting: *Deleted

## 2012-12-12 DIAGNOSIS — J9819 Other pulmonary collapse: Secondary | ICD-10-CM | POA: Diagnosis not present

## 2012-12-12 DIAGNOSIS — I208 Other forms of angina pectoris: Secondary | ICD-10-CM | POA: Diagnosis present

## 2012-12-12 DIAGNOSIS — I472 Ventricular tachycardia, unspecified: Secondary | ICD-10-CM | POA: Diagnosis not present

## 2012-12-12 DIAGNOSIS — Y9210 Unspecified residential institution as the place of occurrence of the external cause: Secondary | ICD-10-CM | POA: Diagnosis not present

## 2012-12-12 DIAGNOSIS — I480 Paroxysmal atrial fibrillation: Secondary | ICD-10-CM | POA: Diagnosis not present

## 2012-12-12 DIAGNOSIS — D62 Acute posthemorrhagic anemia: Secondary | ICD-10-CM | POA: Diagnosis not present

## 2012-12-12 DIAGNOSIS — Z7982 Long term (current) use of aspirin: Secondary | ICD-10-CM

## 2012-12-12 DIAGNOSIS — Z7901 Long term (current) use of anticoagulants: Secondary | ICD-10-CM

## 2012-12-12 DIAGNOSIS — F411 Generalized anxiety disorder: Secondary | ICD-10-CM | POA: Diagnosis not present

## 2012-12-12 DIAGNOSIS — Z87442 Personal history of urinary calculi: Secondary | ICD-10-CM

## 2012-12-12 DIAGNOSIS — D696 Thrombocytopenia, unspecified: Secondary | ICD-10-CM | POA: Diagnosis not present

## 2012-12-12 DIAGNOSIS — Z8612 Personal history of poliomyelitis: Secondary | ICD-10-CM

## 2012-12-12 DIAGNOSIS — Z79899 Other long term (current) drug therapy: Secondary | ICD-10-CM

## 2012-12-12 DIAGNOSIS — E785 Hyperlipidemia, unspecified: Secondary | ICD-10-CM | POA: Diagnosis present

## 2012-12-12 DIAGNOSIS — I2089 Other forms of angina pectoris: Secondary | ICD-10-CM | POA: Diagnosis present

## 2012-12-12 DIAGNOSIS — K219 Gastro-esophageal reflux disease without esophagitis: Secondary | ICD-10-CM | POA: Diagnosis present

## 2012-12-12 DIAGNOSIS — Z951 Presence of aortocoronary bypass graft: Secondary | ICD-10-CM

## 2012-12-12 DIAGNOSIS — I4729 Other ventricular tachycardia: Secondary | ICD-10-CM | POA: Diagnosis not present

## 2012-12-12 DIAGNOSIS — A809 Acute poliomyelitis, unspecified: Secondary | ICD-10-CM | POA: Diagnosis present

## 2012-12-12 DIAGNOSIS — I4891 Unspecified atrial fibrillation: Secondary | ICD-10-CM | POA: Diagnosis not present

## 2012-12-12 DIAGNOSIS — G589 Mononeuropathy, unspecified: Secondary | ICD-10-CM | POA: Diagnosis present

## 2012-12-12 DIAGNOSIS — R931 Abnormal findings on diagnostic imaging of heart and coronary circulation: Secondary | ICD-10-CM | POA: Diagnosis present

## 2012-12-12 DIAGNOSIS — E876 Hypokalemia: Secondary | ICD-10-CM | POA: Diagnosis not present

## 2012-12-12 DIAGNOSIS — E8779 Other fluid overload: Secondary | ICD-10-CM | POA: Diagnosis not present

## 2012-12-12 DIAGNOSIS — Z8249 Family history of ischemic heart disease and other diseases of the circulatory system: Secondary | ICD-10-CM

## 2012-12-12 DIAGNOSIS — T462X5A Adverse effect of other antidysrhythmic drugs, initial encounter: Secondary | ICD-10-CM | POA: Diagnosis not present

## 2012-12-12 DIAGNOSIS — I10 Essential (primary) hypertension: Secondary | ICD-10-CM | POA: Diagnosis present

## 2012-12-12 DIAGNOSIS — M109 Gout, unspecified: Secondary | ICD-10-CM | POA: Diagnosis present

## 2012-12-12 DIAGNOSIS — I2510 Atherosclerotic heart disease of native coronary artery without angina pectoris: Principal | ICD-10-CM | POA: Diagnosis present

## 2012-12-12 DIAGNOSIS — R259 Unspecified abnormal involuntary movements: Secondary | ICD-10-CM | POA: Diagnosis not present

## 2012-12-12 HISTORY — PX: CORONARY ARTERY BYPASS GRAFT: SHX141

## 2012-12-12 HISTORY — DX: Atherosclerotic heart disease of native coronary artery without angina pectoris: I25.10

## 2012-12-12 HISTORY — DX: Presence of aortocoronary bypass graft: Z95.1

## 2012-12-12 LAB — POCT I-STAT 4, (NA,K, GLUC, HGB,HCT)
Glucose, Bld: 95 mg/dL (ref 70–99)
Glucose, Bld: 102 mg/dL — ABNORMAL HIGH (ref 70–99)
Glucose, Bld: 94 mg/dL (ref 70–99)
Glucose, Bld: 128 mg/dL — ABNORMAL HIGH (ref 70–99)
Glucose, Bld: 126 mg/dL — ABNORMAL HIGH (ref 70–99)
Glucose, Bld: 112 mg/dL — ABNORMAL HIGH (ref 70–99)
HCT: 41 % (ref 39.0–52.0)
HCT: 35 % — ABNORMAL LOW (ref 39.0–52.0)
HCT: 28 % — ABNORMAL LOW (ref 39.0–52.0)
HCT: 31 % — ABNORMAL LOW (ref 39.0–52.0)
HCT: 26 % — ABNORMAL LOW (ref 39.0–52.0)
HCT: 33 % — ABNORMAL LOW (ref 39.0–52.0)
Hemoglobin: 13.9 g/dL (ref 13.0–17.0)
Hemoglobin: 11.9 g/dL — ABNORMAL LOW (ref 13.0–17.0)
Hemoglobin: 9.5 g/dL — ABNORMAL LOW (ref 13.0–17.0)
Hemoglobin: 10.5 g/dL — ABNORMAL LOW (ref 13.0–17.0)
Hemoglobin: 8.8 g/dL — ABNORMAL LOW (ref 13.0–17.0)
Hemoglobin: 11.2 g/dL — ABNORMAL LOW (ref 13.0–17.0)
Potassium: 3 mEq/L — ABNORMAL LOW (ref 3.5–5.1)
Potassium: 3.2 mEq/L — ABNORMAL LOW (ref 3.5–5.1)
Potassium: 2.9 mEq/L — ABNORMAL LOW (ref 3.5–5.1)
Potassium: 3.9 mEq/L (ref 3.5–5.1)
Potassium: 2.9 mEq/L — ABNORMAL LOW (ref 3.5–5.1)
Potassium: 2.8 mEq/L — ABNORMAL LOW (ref 3.5–5.1)
Sodium: 140 mEq/L (ref 135–145)
Sodium: 138 mEq/L (ref 135–145)
Sodium: 135 mEq/L (ref 135–145)
Sodium: 136 mEq/L (ref 135–145)
Sodium: 140 mEq/L (ref 135–145)
Sodium: 141 mEq/L (ref 135–145)

## 2012-12-12 LAB — POCT I-STAT 3, ART BLOOD GAS (G3+)
Acid-Base Excess: 5 mmol/L — ABNORMAL HIGH (ref 0.0–2.0)
Bicarbonate: 28.3 mEq/L — ABNORMAL HIGH (ref 20.0–24.0)
Bicarbonate: 23.5 mEq/L (ref 20.0–24.0)
Bicarbonate: 24.3 mEq/L — ABNORMAL HIGH (ref 20.0–24.0)
O2 Saturation: 100 %
O2 Saturation: 100 %
O2 Saturation: 99 %
Patient temperature: 36
TCO2: 29 mmol/L (ref 0–100)
TCO2: 24 mmol/L (ref 0–100)
TCO2: 25 mmol/L (ref 0–100)
pCO2 arterial: 37.2 mmHg (ref 35.0–45.0)
pCO2 arterial: 31 mmHg — ABNORMAL LOW (ref 35.0–45.0)
pCO2 arterial: 35 mmHg (ref 35.0–45.0)
pH, Arterial: 7.49 — ABNORMAL HIGH (ref 7.350–7.450)
pH, Arterial: 7.489 — ABNORMAL HIGH (ref 7.350–7.450)
pH, Arterial: 7.445 (ref 7.350–7.450)
pO2, Arterial: 319 mmHg — ABNORMAL HIGH (ref 80.0–100.0)
pO2, Arterial: 182 mmHg — ABNORMAL HIGH (ref 80.0–100.0)
pO2, Arterial: 142 mmHg — ABNORMAL HIGH (ref 80.0–100.0)

## 2012-12-12 LAB — CBC
HCT: 31.9 % — ABNORMAL LOW (ref 39.0–52.0)
Hemoglobin: 11.1 g/dL — ABNORMAL LOW (ref 13.0–17.0)
MCH: 29.8 pg (ref 26.0–34.0)
MCHC: 34.8 g/dL (ref 30.0–36.0)
MCV: 85.8 fL (ref 78.0–100.0)
Platelets: 121 10*3/uL — ABNORMAL LOW (ref 150–400)
RBC: 3.72 MIL/uL — ABNORMAL LOW (ref 4.22–5.81)
RDW: 13.3 % (ref 11.5–15.5)
WBC: 11.8 10*3/uL — ABNORMAL HIGH (ref 4.0–10.5)

## 2012-12-12 LAB — HEMOGLOBIN AND HEMATOCRIT, BLOOD
HCT: 30.3 % — ABNORMAL LOW (ref 39.0–52.0)
Hemoglobin: 10.7 g/dL — ABNORMAL LOW (ref 13.0–17.0)

## 2012-12-12 LAB — PROTIME-INR
INR: 1.52 — ABNORMAL HIGH (ref 0.00–1.49)
Prothrombin Time: 17.9 seconds — ABNORMAL HIGH (ref 11.6–15.2)

## 2012-12-12 LAB — PLATELET COUNT: Platelets: 160 10*3/uL (ref 150–400)

## 2012-12-12 LAB — APTT: aPTT: 34 seconds (ref 24–37)

## 2012-12-12 SURGERY — CORONARY ARTERY BYPASS GRAFTING (CABG)
Anesthesia: General | Site: Chest | Wound class: Clean

## 2012-12-12 MED ORDER — ROCURONIUM BROMIDE 100 MG/10ML IV SOLN
INTRAVENOUS | Status: DC | PRN
  Administered 2012-12-12: 50 mg via INTRAVENOUS

## 2012-12-12 MED ORDER — SODIUM CHLORIDE 0.9 % IV SOLN
100.0000 [IU] | INTRAVENOUS | Status: DC | PRN
  Administered 2012-12-12: 1.1 [IU]/h via INTRAVENOUS

## 2012-12-12 MED ORDER — OXYCODONE HCL 5 MG PO TABS
5.0000 mg | ORAL_TABLET | ORAL | Status: DC | PRN
  Administered 2012-12-13 (×3): 10 mg via ORAL
  Administered 2012-12-13: 5 mg via ORAL
  Administered 2012-12-14: 10 mg via ORAL
  Filled 2012-12-12: qty 2
  Filled 2012-12-12: qty 1
  Filled 2012-12-12 (×3): qty 2

## 2012-12-12 MED ORDER — MAGNESIUM SULFATE 40 MG/ML IJ SOLN
4.0000 g | Freq: Once | INTRAMUSCULAR | Status: AC
  Administered 2012-12-12: 4 g via INTRAVENOUS
  Filled 2012-12-12: qty 100

## 2012-12-12 MED ORDER — ACETAMINOPHEN 160 MG/5ML PO SOLN
975.0000 mg | Freq: Four times a day (QID) | ORAL | Status: AC
  Administered 2012-12-17: 975 mg
  Filled 2012-12-12: qty 40.6

## 2012-12-12 MED ORDER — FAMOTIDINE IN NACL 20-0.9 MG/50ML-% IV SOLN
20.0000 mg | Freq: Two times a day (BID) | INTRAVENOUS | Status: AC
  Administered 2012-12-13: 20 mg via INTRAVENOUS
  Filled 2012-12-12: qty 50

## 2012-12-12 MED ORDER — PHENYLEPHRINE HCL 10 MG/ML IJ SOLN
10.0000 mg | INTRAVENOUS | Status: DC | PRN
  Administered 2012-12-12: 5 ug/min via INTRAVENOUS

## 2012-12-12 MED ORDER — DEXTROSE 5 % IV SOLN
1.5000 g | Freq: Two times a day (BID) | INTRAVENOUS | Status: AC
  Administered 2012-12-12 – 2012-12-14 (×4): 1.5 g via INTRAVENOUS
  Filled 2012-12-12 (×4): qty 1.5

## 2012-12-12 MED ORDER — VECURONIUM BROMIDE 10 MG IV SOLR
INTRAVENOUS | Status: DC | PRN
  Administered 2012-12-12 (×2): 5 mg via INTRAVENOUS

## 2012-12-12 MED ORDER — DOCUSATE SODIUM 100 MG PO CAPS
200.0000 mg | ORAL_CAPSULE | Freq: Every day | ORAL | Status: DC
  Administered 2012-12-13 – 2012-12-21 (×6): 200 mg via ORAL
  Filled 2012-12-12 (×9): qty 2

## 2012-12-12 MED ORDER — ALBUMIN HUMAN 5 % IV SOLN
250.0000 mL | INTRAVENOUS | Status: AC | PRN
  Administered 2012-12-12 (×4): 250 mL via INTRAVENOUS
  Filled 2012-12-12: qty 750

## 2012-12-12 MED ORDER — MORPHINE SULFATE 2 MG/ML IJ SOLN
2.0000 mg | INTRAMUSCULAR | Status: DC | PRN
  Administered 2012-12-13 (×2): 2 mg via INTRAVENOUS
  Administered 2012-12-13: 4 mg via INTRAVENOUS
  Administered 2012-12-13: 2 mg via INTRAVENOUS
  Administered 2012-12-14 (×3): 4 mg via INTRAVENOUS
  Filled 2012-12-12 (×2): qty 1
  Filled 2012-12-12: qty 2
  Filled 2012-12-12: qty 1
  Filled 2012-12-12: qty 2
  Filled 2012-12-12: qty 1
  Filled 2012-12-12: qty 2
  Filled 2012-12-12: qty 1
  Filled 2012-12-12: qty 2

## 2012-12-12 MED ORDER — SODIUM CHLORIDE 0.9 % IJ SOLN
OROMUCOSAL | Status: DC | PRN
  Administered 2012-12-12 (×2): via TOPICAL

## 2012-12-12 MED ORDER — LACTATED RINGERS IV SOLN
INTRAVENOUS | Status: DC | PRN
  Administered 2012-12-12: 13:00:00 via INTRAVENOUS

## 2012-12-12 MED ORDER — FENTANYL CITRATE 0.05 MG/ML IJ SOLN
INTRAMUSCULAR | Status: DC | PRN
  Administered 2012-12-12 (×3): 50 ug via INTRAVENOUS
  Administered 2012-12-12 (×3): 250 ug via INTRAVENOUS
  Administered 2012-12-12 (×2): 50 ug via INTRAVENOUS
  Administered 2012-12-12: 100 ug via INTRAVENOUS
  Administered 2012-12-12: 150 ug via INTRAVENOUS
  Administered 2012-12-12: 250 ug via INTRAVENOUS

## 2012-12-12 MED ORDER — ARTIFICIAL TEARS OP OINT
TOPICAL_OINTMENT | OPHTHALMIC | Status: DC | PRN
  Administered 2012-12-12: 1 via OPHTHALMIC

## 2012-12-12 MED ORDER — ACETAMINOPHEN 10 MG/ML IV SOLN
1000.0000 mg | Freq: Once | INTRAVENOUS | Status: AC
  Administered 2012-12-12: 1000 mg via INTRAVENOUS
  Filled 2012-12-12: qty 100

## 2012-12-12 MED ORDER — SODIUM CHLORIDE 0.9 % IJ SOLN: 3.0000 mL | INTRAMUSCULAR | Status: DC | PRN

## 2012-12-12 MED ORDER — METOPROLOL TARTRATE 12.5 MG HALF TABLET
12.5000 mg | ORAL_TABLET | Freq: Two times a day (BID) | ORAL | Status: DC
  Filled 2012-12-12 (×5): qty 1

## 2012-12-12 MED ORDER — BISACODYL 10 MG RE SUPP: 10.0000 mg | Freq: Every day | RECTAL | Status: DC

## 2012-12-12 MED ORDER — INSULIN REGULAR BOLUS VIA INFUSION
0.0000 [IU] | Freq: Three times a day (TID) | INTRAVENOUS | Status: DC
  Filled 2012-12-12: qty 10

## 2012-12-12 MED ORDER — SODIUM CHLORIDE 0.9 % IV SOLN
10.0000 g | INTRAVENOUS | Status: DC | PRN
  Administered 2012-12-12: 1 g/h via INTRAVENOUS

## 2012-12-12 MED ORDER — MORPHINE SULFATE 2 MG/ML IJ SOLN
1.0000 mg | INTRAMUSCULAR | Status: AC | PRN
  Administered 2012-12-13: 2 mg via INTRAVENOUS

## 2012-12-12 MED ORDER — PROTAMINE SULFATE 10 MG/ML IV SOLN
INTRAVENOUS | Status: DC | PRN
  Administered 2012-12-12: 350 mg via INTRAVENOUS

## 2012-12-12 MED ORDER — NITROGLYCERIN IN D5W 200-5 MCG/ML-% IV SOLN: 0.0000 ug/min | INTRAVENOUS | Status: DC

## 2012-12-12 MED ORDER — ACETAMINOPHEN 500 MG PO TABS
1000.0000 mg | ORAL_TABLET | Freq: Four times a day (QID) | ORAL | Status: AC
  Administered 2012-12-13 – 2012-12-17 (×17): 1000 mg via ORAL
  Filled 2012-12-12 (×19): qty 2

## 2012-12-12 MED ORDER — SODIUM CHLORIDE 0.9 % IV SOLN
INTRAVENOUS | Status: DC
  Filled 2012-12-12 (×2): qty 1

## 2012-12-12 MED ORDER — VANCOMYCIN HCL IN DEXTROSE 1-5 GM/200ML-% IV SOLN
1000.0000 mg | Freq: Once | INTRAVENOUS | Status: AC
  Administered 2012-12-13: 1000 mg via INTRAVENOUS
  Filled 2012-12-12: qty 200

## 2012-12-12 MED ORDER — HEPARIN SODIUM (PORCINE) 1000 UNIT/ML IJ SOLN
INTRAMUSCULAR | Status: DC | PRN
  Administered 2012-12-12: 40000 [IU] via INTRAVENOUS

## 2012-12-12 MED ORDER — PROPOFOL 10 MG/ML IV BOLUS
INTRAVENOUS | Status: DC | PRN
  Administered 2012-12-12: 70 mg via INTRAVENOUS

## 2012-12-12 MED ORDER — PANTOPRAZOLE SODIUM 40 MG PO TBEC
40.0000 mg | DELAYED_RELEASE_TABLET | Freq: Every day | ORAL | Status: DC
  Administered 2012-12-14 – 2012-12-20 (×7): 40 mg via ORAL
  Filled 2012-12-12 (×6): qty 1

## 2012-12-12 MED ORDER — SIMVASTATIN 40 MG PO TABS
40.0000 mg | ORAL_TABLET | Freq: Every day | ORAL | Status: DC
  Administered 2012-12-13: 40 mg via ORAL
  Filled 2012-12-12 (×2): qty 1

## 2012-12-12 MED ORDER — ASPIRIN 81 MG PO CHEW
324.0000 mg | CHEWABLE_TABLET | Freq: Every day | ORAL | Status: DC
  Filled 2012-12-12: qty 1

## 2012-12-12 MED ORDER — DEXMEDETOMIDINE HCL IN NACL 200 MCG/50ML IV SOLN
0.1000 ug/kg/h | INTRAVENOUS | Status: DC
  Administered 2012-12-12: 0.5 ug/kg/h via INTRAVENOUS
  Filled 2012-12-12: qty 50

## 2012-12-12 MED ORDER — ALLOPURINOL 300 MG PO TABS
300.0000 mg | ORAL_TABLET | Freq: Every day | ORAL | Status: DC
  Administered 2012-12-13 – 2012-12-21 (×9): 300 mg via ORAL
  Filled 2012-12-12 (×9): qty 1

## 2012-12-12 MED ORDER — LACTATED RINGERS IV SOLN
INTRAVENOUS | Status: DC | PRN
  Administered 2012-12-12 (×2): via INTRAVENOUS

## 2012-12-12 MED ORDER — SODIUM CHLORIDE 0.9 % IJ SOLN
3.0000 mL | Freq: Two times a day (BID) | INTRAMUSCULAR | Status: DC
  Administered 2012-12-13 – 2012-12-14 (×3): 3 mL via INTRAVENOUS

## 2012-12-12 MED ORDER — NITROGLYCERIN IN D5W 200-5 MCG/ML-% IV SOLN
INTRAVENOUS | Status: DC | PRN
  Administered 2012-12-12: 5 ug/min via INTRAVENOUS

## 2012-12-12 MED ORDER — BISACODYL 5 MG PO TBEC
10.0000 mg | DELAYED_RELEASE_TABLET | Freq: Every day | ORAL | Status: DC
  Administered 2012-12-13 – 2012-12-15 (×3): 10 mg via ORAL
  Filled 2012-12-12 (×3): qty 2

## 2012-12-12 MED ORDER — SODIUM CHLORIDE 0.45 % IV SOLN: INTRAVENOUS | Status: DC

## 2012-12-12 MED ORDER — AMINOCAPROIC ACID 250 MG/ML IV SOLN
INTRAVENOUS | Status: DC | PRN
  Administered 2012-12-12: 5 g via INTRAVENOUS

## 2012-12-12 MED ORDER — MIDAZOLAM HCL 5 MG/5ML IJ SOLN
INTRAMUSCULAR | Status: DC | PRN
  Administered 2012-12-12 (×3): 1 mg via INTRAVENOUS
  Administered 2012-12-12: 3 mg via INTRAVENOUS
  Administered 2012-12-12: 2 mg via INTRAVENOUS
  Administered 2012-12-12: 3 mg via INTRAVENOUS
  Administered 2012-12-12 (×2): 2 mg via INTRAVENOUS

## 2012-12-12 MED ORDER — SODIUM CHLORIDE 0.9 % IJ SOLN
OROMUCOSAL | Status: DC | PRN
  Administered 2012-12-12: 15:00:00 via TOPICAL

## 2012-12-12 MED ORDER — LACTATED RINGERS IV SOLN
INTRAVENOUS | Status: DC | PRN
  Administered 2012-12-12 (×2): via INTRAVENOUS

## 2012-12-12 MED ORDER — MIDAZOLAM HCL 2 MG/2ML IJ SOLN
2.0000 mg | INTRAMUSCULAR | Status: DC | PRN
  Filled 2012-12-12: qty 2

## 2012-12-12 MED ORDER — ALBUMIN HUMAN 5 % IV SOLN
INTRAVENOUS | Status: DC | PRN
  Administered 2012-12-12 (×2): via INTRAVENOUS

## 2012-12-12 MED ORDER — ASPIRIN EC 325 MG PO TBEC
325.0000 mg | DELAYED_RELEASE_TABLET | Freq: Every day | ORAL | Status: DC
  Administered 2012-12-13 – 2012-12-17 (×5): 325 mg via ORAL
  Filled 2012-12-12 (×6): qty 1

## 2012-12-12 MED ORDER — METOPROLOL TARTRATE 1 MG/ML IV SOLN
2.5000 mg | INTRAVENOUS | Status: DC | PRN
  Administered 2012-12-14: 2.5 mg via INTRAVENOUS
  Administered 2012-12-14 – 2012-12-15 (×2): 5 mg via INTRAVENOUS
  Filled 2012-12-12 (×2): qty 5

## 2012-12-12 MED ORDER — ONDANSETRON HCL 4 MG/2ML IJ SOLN
4.0000 mg | Freq: Four times a day (QID) | INTRAMUSCULAR | Status: DC | PRN
  Administered 2012-12-13 – 2012-12-14 (×2): 4 mg via INTRAVENOUS
  Filled 2012-12-12 (×2): qty 2

## 2012-12-12 MED ORDER — DEXTROSE 5 % IV SOLN
0.0000 ug/min | INTRAVENOUS | Status: DC
  Filled 2012-12-12 (×2): qty 2

## 2012-12-12 MED ORDER — 0.9 % SODIUM CHLORIDE (POUR BTL) OPTIME
TOPICAL | Status: DC | PRN
  Administered 2012-12-12: 4000 mL

## 2012-12-12 MED ORDER — METOPROLOL TARTRATE 25 MG/10 ML ORAL SUSPENSION
12.5000 mg | Freq: Two times a day (BID) | ORAL | Status: DC
  Filled 2012-12-12 (×5): qty 5

## 2012-12-12 MED ORDER — LACTATED RINGERS IV SOLN: INTRAVENOUS | Status: DC

## 2012-12-12 MED ORDER — HEMOSTATIC AGENTS (NO CHARGE) OPTIME
TOPICAL | Status: DC | PRN
  Administered 2012-12-12: 1 via TOPICAL

## 2012-12-12 MED ORDER — SODIUM CHLORIDE 0.9 % IV SOLN: 250.0000 mL | INTRAVENOUS | Status: DC

## 2012-12-12 MED ORDER — LACTATED RINGERS IV SOLN: 500.0000 mL | Freq: Once | INTRAVENOUS | Status: AC | PRN

## 2012-12-12 MED ORDER — 0.9 % SODIUM CHLORIDE (POUR BTL) OPTIME
TOPICAL | Status: DC | PRN
  Administered 2012-12-12: 1000 mL

## 2012-12-12 MED ORDER — DEXMEDETOMIDINE HCL IN NACL 200 MCG/50ML IV SOLN
INTRAVENOUS | Status: DC | PRN
  Administered 2012-12-12: 0.2 ug/kg/h via INTRAVENOUS

## 2012-12-12 MED ORDER — PHENYLEPHRINE HCL 10 MG/ML IJ SOLN
10.0000 mg | INTRAVENOUS | Status: DC | PRN
  Administered 2012-12-12: 15 ug/min via INTRAVENOUS

## 2012-12-12 MED ORDER — POTASSIUM CHLORIDE 10 MEQ/50ML IV SOLN
10.0000 meq | Freq: Once | INTRAVENOUS | Status: AC
  Administered 2012-12-12: 10 meq via INTRAVENOUS
  Filled 2012-12-12: qty 50

## 2012-12-12 MED ORDER — LIDOCAINE HCL (CARDIAC) 20 MG/ML IV SOLN
INTRAVENOUS | Status: DC | PRN
  Administered 2012-12-12: 80 mg via INTRAVENOUS

## 2012-12-12 MED ORDER — POTASSIUM CHLORIDE 10 MEQ/50ML IV SOLN
10.0000 meq | INTRAVENOUS | Status: AC
  Administered 2012-12-12 (×3): 10 meq via INTRAVENOUS

## 2012-12-12 MED ORDER — SODIUM CHLORIDE 0.9 % IV SOLN: INTRAVENOUS | Status: DC

## 2012-12-12 MED ORDER — MUPIROCIN 2 % EX OINT
TOPICAL_OINTMENT | Freq: Two times a day (BID) | CUTANEOUS | Status: DC
  Administered 2012-12-12: 1 via NASAL
  Administered 2012-12-13: 15:00:00 via NASAL
  Administered 2012-12-13 – 2012-12-14 (×2): 1 via NASAL
  Administered 2012-12-14 – 2012-12-19 (×8): via NASAL
  Filled 2012-12-12: qty 22

## 2012-12-12 SURGICAL SUPPLY — 88 items
APL SKNCLS STERI-STRIP NONHPOA (GAUZE/BANDAGES/DRESSINGS) ×1
ATTRACTOMAT 16X20 MAGNETIC DRP (DRAPES) ×2 IMPLANT
BAG DECANTER FOR FLEXI CONT (MISCELLANEOUS) ×2 IMPLANT
BANDAGE ELASTIC 4 VELCRO ST LF (GAUZE/BANDAGES/DRESSINGS) ×2 IMPLANT
BANDAGE ELASTIC 6 VELCRO ST LF (GAUZE/BANDAGES/DRESSINGS) ×2 IMPLANT
BANDAGE GAUZE ELAST BULKY 4 IN (GAUZE/BANDAGES/DRESSINGS) ×2 IMPLANT
BASKET HEART (ORDER IN 25'S) (MISCELLANEOUS) ×1
BASKET HEART (ORDER IN 25S) (MISCELLANEOUS) ×1 IMPLANT
BENZOIN TINCTURE PRP APPL 2/3 (GAUZE/BANDAGES/DRESSINGS) ×1 IMPLANT
BLADE STERNUM SYSTEM 6 (BLADE) ×2 IMPLANT
BLADE SURG 11 STRL SS (BLADE) ×1 IMPLANT
CANISTER SUCTION 2500CC (MISCELLANEOUS) ×2 IMPLANT
CANNULA SOFTFLOW AORTIC 7M21FR (CANNULA) ×1 IMPLANT
CATH CPB KIT HENDRICKSON (MISCELLANEOUS) ×1 IMPLANT
CATH CPB KIT VANTRIGT (MISCELLANEOUS) ×1 IMPLANT
CATH ROBINSON RED A/P 18FR (CATHETERS) ×4 IMPLANT
CATH THORACIC 36FR (CATHETERS) ×2 IMPLANT
CATH THORACIC 36FR RT ANG (CATHETERS) ×2 IMPLANT
CLIP TI MEDIUM 24 (CLIP) IMPLANT
CLIP TI WIDE RED SMALL 24 (CLIP) ×1 IMPLANT
CLOTH BEACON ORANGE TIMEOUT ST (SAFETY) ×2 IMPLANT
COVER SURGICAL LIGHT HANDLE (MISCELLANEOUS) ×2 IMPLANT
CRADLE DONUT ADULT HEAD (MISCELLANEOUS) ×2 IMPLANT
DRAPE CARDIOVASCULAR INCISE (DRAPES) ×2
DRAPE SLUSH/WARMER DISC (DRAPES) ×2 IMPLANT
DRAPE SRG 135X102X78XABS (DRAPES) ×1 IMPLANT
DRSG COVADERM 4X14 (GAUZE/BANDAGES/DRESSINGS) ×2 IMPLANT
ELECT REM PT RETURN 9FT ADLT (ELECTROSURGICAL) ×4
ELECTRODE REM PT RTRN 9FT ADLT (ELECTROSURGICAL) ×2 IMPLANT
GLOVE BIO SURGEON STRL SZ 6 (GLOVE) ×4 IMPLANT
GLOVE BIO SURGEON STRL SZ7.5 (GLOVE) ×1 IMPLANT
GLOVE BIOGEL PI IND STRL 6.5 (GLOVE) IMPLANT
GLOVE BIOGEL PI INDICATOR 6.5 (GLOVE) ×4
GLOVE EUDERMIC 7 POWDERFREE (GLOVE) ×6 IMPLANT
GOWN PREVENTION PLUS XLARGE (GOWN DISPOSABLE) ×4 IMPLANT
GOWN STRL NON-REIN LRG LVL3 (GOWN DISPOSABLE) ×8 IMPLANT
HEMOSTAT POWDER SURGIFOAM 1G (HEMOSTASIS) ×6 IMPLANT
HEMOSTAT SURGICEL 2X14 (HEMOSTASIS) ×1 IMPLANT
INSERT FOGARTY XLG (MISCELLANEOUS) IMPLANT
KIT BASIN OR (CUSTOM PROCEDURE TRAY) ×2 IMPLANT
KIT ROOM TURNOVER OR (KITS) ×2 IMPLANT
KIT SUCTION CATH 14FR (SUCTIONS) ×4 IMPLANT
KIT VASOVIEW W/TROCAR VH 2000 (KITS) ×2 IMPLANT
MARKER GRAFT CORONARY BYPASS (MISCELLANEOUS) ×6 IMPLANT
NS IRRIG 1000ML POUR BTL (IV SOLUTION) ×10 IMPLANT
PACK OPEN HEART (CUSTOM PROCEDURE TRAY) ×2 IMPLANT
PAD ARMBOARD 7.5X6 YLW CONV (MISCELLANEOUS) ×4 IMPLANT
PENCIL BUTTON HOLSTER BLD 10FT (ELECTRODE) ×2 IMPLANT
PUNCH AORTIC ROTATE 4.0MM (MISCELLANEOUS) IMPLANT
PUNCH AORTIC ROTATE 4.5MM 8IN (MISCELLANEOUS) ×1 IMPLANT
PUNCH AORTIC ROTATE 5MM 8IN (MISCELLANEOUS) IMPLANT
SET CARDIOPLEGIA MPS 5001102 (MISCELLANEOUS) ×1 IMPLANT
SPONGE GAUZE 4X4 12PLY (GAUZE/BANDAGES/DRESSINGS) ×4 IMPLANT
STRIP CLOSURE SKIN 1/2X4 (GAUZE/BANDAGES/DRESSINGS) ×1 IMPLANT
SUT BONE WAX W31G (SUTURE) ×2 IMPLANT
SUT MNCRL AB 4-0 PS2 18 (SUTURE) ×1 IMPLANT
SUT PROLENE 3 0 SH DA (SUTURE) ×2 IMPLANT
SUT PROLENE 4 0 RB 1 (SUTURE) ×2
SUT PROLENE 4 0 SH DA (SUTURE) ×1 IMPLANT
SUT PROLENE 4-0 RB1 .5 CRCL 36 (SUTURE) IMPLANT
SUT PROLENE 6 0 C 1 30 (SUTURE) ×6 IMPLANT
SUT PROLENE 7 0 BV 1 (SUTURE) ×2 IMPLANT
SUT PROLENE 7 0 BV1 MDA (SUTURE) ×2 IMPLANT
SUT PROLENE 8 0 BV175 6 (SUTURE) ×1 IMPLANT
SUT SILK  1 MH (SUTURE)
SUT SILK 1 MH (SUTURE) IMPLANT
SUT STEEL 6MS V (SUTURE) ×1 IMPLANT
SUT STEEL STERNAL CCS#1 18IN (SUTURE) IMPLANT
SUT STEEL SZ 6 DBL 3X14 BALL (SUTURE) ×1 IMPLANT
SUT VIC AB 1 CTX 36 (SUTURE) ×4
SUT VIC AB 1 CTX36XBRD ANBCTR (SUTURE) ×2 IMPLANT
SUT VIC AB 2-0 CT1 27 (SUTURE) ×2
SUT VIC AB 2-0 CT1 TAPERPNT 27 (SUTURE) IMPLANT
SUT VIC AB 2-0 CTX 27 (SUTURE) ×2 IMPLANT
SUT VIC AB 3-0 SH 27 (SUTURE)
SUT VIC AB 3-0 SH 27X BRD (SUTURE) IMPLANT
SUT VIC AB 3-0 X1 27 (SUTURE) ×2 IMPLANT
SUT VICRYL 4-0 PS2 18IN ABS (SUTURE) ×1 IMPLANT
SUTURE E-PAK OPEN HEART (SUTURE) ×2 IMPLANT
SYSTEM SAHARA CHEST DRAIN ATS (WOUND CARE) ×2 IMPLANT
TAPE CLOTH SURG 4X10 WHT LF (GAUZE/BANDAGES/DRESSINGS) ×1 IMPLANT
TOWEL OR 17X24 6PK STRL BLUE (TOWEL DISPOSABLE) ×4 IMPLANT
TOWEL OR 17X26 10 PK STRL BLUE (TOWEL DISPOSABLE) ×4 IMPLANT
TRAY FOLEY IC TEMP SENS 14FR (CATHETERS) ×2 IMPLANT
TUBE FEEDING 8FR 16IN STR KANG (MISCELLANEOUS) ×2 IMPLANT
TUBING INSUFFLATION 10FT LAP (TUBING) ×2 IMPLANT
UNDERPAD 30X30 INCONTINENT (UNDERPADS AND DIAPERS) ×2 IMPLANT
WATER STERILE IRR 1000ML POUR (IV SOLUTION) ×4 IMPLANT

## 2012-12-12 NOTE — Anesthesia Preprocedure Evaluation (Signed)
Anesthesia Evaluation  Patient identified by MRN, date of birth, ID band Patient awake    Reviewed: Allergy & Precautions, H&P , NPO status   Airway Mallampati: II  Neck ROM: Full    Dental  (+) Teeth Intact   Pulmonary  breath sounds clear to auscultation        Cardiovascular hypertension, + CAD Rhythm:Regular Rate:Normal     Neuro/Psych  Neuromuscular disease    GI/Hepatic Neg liver ROS, GERD-  ,  Endo/Other  negative endocrine ROS  Renal/GU Renal disease     Musculoskeletal   Abdominal (+) + obese,   Peds  Hematology   Anesthesia Other Findings   Reproductive/Obstetrics                           Anesthesia Physical Anesthesia Plan  ASA: III  Anesthesia Plan: General   Post-op Pain Management:    Induction: Intravenous  Airway Management Planned: Oral ETT  Additional Equipment:   Intra-op Plan:   Post-operative Plan: Extubation in OR  Informed Consent:   Dental advisory given  Plan Discussed with: CRNA and Surgeon  Anesthesia Plan Comments:         Anesthesia Quick Evaluation

## 2012-12-12 NOTE — OR Nursing (Signed)
Two separate incisional timeouts completed. One with PA at 1419 prior to leg incision, the second with Dr Maren Beach @ 1422 prior to Chest incision.

## 2012-12-12 NOTE — Progress Notes (Signed)
Patient and family aware and updated on delay

## 2012-12-12 NOTE — Progress Notes (Signed)
Dr. Cornelius Moras aware of cardiac index 1.4; after increasing pacer from AAI 80 to AAI 90, and 3 albumins. VS otherwise stable. Urine output adequate. Chest tube output minimal. Order to give 2 more albumin received. Thresa Ross RN

## 2012-12-12 NOTE — Interval H&P Note (Signed)
History and Physical Interval Note:  12/12/2012 2:21 PM  Hunter [name]  has presented today for surgery, with the diagnosis of CAD  The various methods of treatment have been discussed with the patient and family. After consideration of risks, benefits and other options for treatment, the patient has consented to  Procedure(s) (LRB) with comments: CORONARY ARTERY BYPASS GRAFTING (CABG) (N/A) as a surgical intervention .  The patient's history has been reviewed, patient examined, no change in status, stable for surgery.  I have reviewed the patient's chart and labs.  Questions were answered to the patient's satisfaction.     Buck Mcaffee C

## 2012-12-12 NOTE — Transfer of Care (Signed)
Immediate Anesthesia Transfer of Care Note  Patient: Hunter [name]  Procedure(s) Performed: Procedure(s) (LRB) with comments: CORONARY ARTERY BYPASS GRAFTING (CABG) (N/A) - Coronary artery bypass grafting times four  using left internal mammary artery and right endovein harvest  Patient Location: SICU  Anesthesia Type:General  Level of Consciousness: sedated  Airway & Oxygen Therapy: Patient remains intubated per anesthesia plan and Patient placed on Ventilator (see vital sign flow sheet for setting)  Post-op Assessment: Report given to PACU RN and Post -op Vital signs reviewed and stable  Post vital signs: Reviewed and stable  Complications: No apparent anesthesia complications

## 2012-12-12 NOTE — Progress Notes (Signed)
Patient reviewed and discussed with Dr Dorris Fetch Will proceed with planned multivessel CABG. Patient appears ready for surgery

## 2012-12-12 NOTE — Preoperative (Signed)
Beta Blockers   Reason not to administer Beta Blockers:Not Applicable 

## 2012-12-12 NOTE — Brief Op Note (Addendum)
12/12/2012  4:46 PM  PATIENT:  Hunter [name]  66 y.o. male  PRE-OPERATIVE DIAGNOSIS:  Coronary Artery Disease  POST-OPERATIVE DIAGNOSIS:  Coronary Artery Disease  PROCEDURE: : CORONARY ARTERY BYPASS GRAFTING x 4 (LIMA-LAD, SVG-OM1-OM2, SVG-PD) EVH Right leg  SURGEON:  Surgeon(s): Loreli Slot, MD Kerin Perna, MD  ASSISTANT: Coral Ceo, PA-C  ANESTHESIA:   general  PATIENT CONDITION:  ICU - intubated and hemodynamically stable.  PRE-OPERATIVE WEIGHT: 94 kg  XC= 72 min CPB= 106 min    Good targets, good conduits PL too small to graft

## 2012-12-12 NOTE — Progress Notes (Signed)
[facility] BRIEF SICU PROGRESS NOTE  Day of Surgery  S/P Procedure(s) (LRB): CORONARY ARTERY BYPASS GRAFTING (CABG) (N/A)   Sedated on vent NSR w/ stable hemodynamics Chest tube output low UOP excellent Labs okay w/ Hgb 11.2  Plan: Continue routine early postop  Hunter [name],Hunter [name] 12/12/2012 6:49 PM

## 2012-12-12 NOTE — H&P (View-Only) (Signed)
Reason for Consult:3 vessel CAD Referring Physician: Dr. Atilano Hunter [name] is an 66 y.o. male.  HPI: 66 yo WM presents with cc/o chest pain.  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 He presented with a c/o epigastric and upper abdominal pain. This was atypical and variably associated with exertion. He had a nuclear treadmill which showed reversible ischemia in the inferior and anteroapical territories. EF was 51%. He says that he has been feeling warm but not sweating when he wakes up in the morning.  Past Medical History  Diagnosis Date  . Polio     left arm atrophy  . Hypertension   . Hyperlipidemia     Past Surgical History  Procedure Date  . Shoulder surgery     for effects of polio    Family History  Problem Relation Age of Onset  . CAD Father     Social History:  reports that he has never smoked. He does not have any smokeless tobacco history on file. His alcohol and drug histories not on file.  Allergies: No Known Allergies  Medications:  Prior to Admission:  Prescriptions prior to admission  Medication Sig Dispense Refill  . allopurinol (ZYLOPRIM) 300 MG tablet Take 300 mg by mouth daily.      Marland Kitchen aspirin 81 MG tablet Take 81 mg by mouth daily.      . fish oil-omega-3 fatty acids 1000 MG capsule Take 1 g by mouth daily.      . niacin 500 MG tablet Take 500 mg by mouth daily with breakfast.      . omeprazole (PRILOSEC) 20 MG capsule Take 20 mg by mouth daily.      . simvastatin (ZOCOR) 40 MG tablet Take 40 mg by mouth every morning.      . triamterene-hydrochlorothiazide (MAXZIDE-25) 37.5-25 MG per tablet Take 1 tablet by mouth daily.        Results for orders placed during the hospital encounter of 12/08/12 (from the past 48 hour(s))  BASIC METABOLIC PANEL     Status: Abnormal   Collection Time   12/08/12  7:27 AM      Component Value Range Comment   Sodium 139  135 - 145 mEq/L    Potassium 3.3 (*)  3.5 - 5.1 mEq/L    Chloride 99  96 - 112 mEq/L    CO2 29  19 - 32 mEq/L    Glucose, Bld 109 (*) 70 - 99 mg/dL    BUN 20  6 - 23 mg/dL    Creatinine, Ser 5.78  0.50 - 1.35 mg/dL    Calcium 9.5  8.4 - 46.9 mg/dL    GFR calc non Af Amer 60 (*) >90 mL/min    GFR calc Af Amer 70 (*) >90 mL/min     No results found.   Review of Systems  Constitutional: Negative for fever and chills.  Cardiovascular: Positive for chest pain.  Gastrointestinal: Positive for heartburn.  Musculoskeletal: Positive for joint pain (right shoulder).       Atrophy of left UE  Neurological: Positive for focal weakness (left UE).  All other systems reviewed and are negative.   Blood pressure 144/83, pulse 77, temperature 97.7 F (36.5 C), temperature source Oral, resp. rate 18, height 5\' 9"  (1.753 m), weight 207 lb (93.895 kg), SpO2 95.00%. Physical Exam  Vitals reviewed. Constitutional: He is oriented to person, place, and time. He appears well-developed and well-nourished. No  distress.  HENT:  Head: Normocephalic and atraumatic.  Eyes: EOM are normal. Pupils are equal, round, and reactive to light.  Neck: Neck supple. No thyromegaly present.       No bruits  Cardiovascular: Normal rate, regular rhythm and normal heart sounds.  Exam reveals no gallop and no friction rub.   No murmur heard. Respiratory: Effort normal and breath sounds normal.  GI: Soft. There is no tenderness.  Musculoskeletal: He exhibits no edema.       Atrophy left UE  Lymphadenopathy:    He has no cervical adenopathy.  Neurological: He is alert and oriented to person, place, and time.       Weakness left arm  Skin: Skin is warm and dry.   Cardiac Cath 12/08/12  Symptomatic multivessel severe CAD, with preserved LV systolic function.  RECOMMENDATION:  Refer for CABG.  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 CABG is indicated for survival benefit and relief of symptoms.   I have discussed with the patient the general nature of the procedure, need for general anesthesia, and the incisions to be used. I discussed the expected hospital stay, overall recovery and short and long term outcomes. He understands the risks include but are not limited to death, stroke, MI, DVT/PE, bleeding, possible need for transfusion, infections, cardiac arrhythmias, and other organ system dysfunction including respiratory, renal, or GI complications. He accepts these risks and agrees to proceed.  We will schedule him for CABG on Tuesday 12/12/2012. He will be admitted on the day of surgery. He was instructed to avoid strenuous exertion. And to call immediately if he has CP lasting longer than a few minutes.  Hunter [name] C 12/08/2012, 11:14 AM

## 2012-12-12 NOTE — OR Nursing (Addendum)
1st call to SICU @1719  2nd call to SICU @1750  On Transport @1814 

## 2012-12-13 ENCOUNTER — Encounter (HOSPITAL_COMMUNITY): Payer: Self-pay | Admitting: Thoracic Surgery (Cardiothoracic Vascular Surgery)

## 2012-12-13 ENCOUNTER — Inpatient Hospital Stay (HOSPITAL_COMMUNITY): Payer: Medicare Other

## 2012-12-13 LAB — POCT I-STAT 3, ART BLOOD GAS (G3+)
Acid-base deficit: 2 mmol/L (ref 0.0–2.0)
Bicarbonate: 23.4 mEq/L (ref 20.0–24.0)
Bicarbonate: 25.3 mEq/L — ABNORMAL HIGH (ref 20.0–24.0)
O2 Saturation: 99 %
O2 Saturation: 98 %
Patient temperature: 37
Patient temperature: 36.9
TCO2: 25 mmol/L (ref 0–100)
TCO2: 27 mmol/L (ref 0–100)
pCO2 arterial: 40 mmHg (ref 35.0–45.0)
pCO2 arterial: 41.8 mmHg (ref 35.0–45.0)
pH, Arterial: 7.376 (ref 7.350–7.450)
pH, Arterial: 7.389 (ref 7.350–7.450)
pO2, Arterial: 131 mmHg — ABNORMAL HIGH (ref 80.0–100.0)
pO2, Arterial: 104 mmHg — ABNORMAL HIGH (ref 80.0–100.0)

## 2012-12-13 LAB — BASIC METABOLIC PANEL
BUN: 15 mg/dL (ref 6–23)
CO2: 23 mEq/L (ref 19–32)
Calcium: 7.9 mg/dL — ABNORMAL LOW (ref 8.4–10.5)
Chloride: 103 mEq/L (ref 96–112)
Creatinine, Ser: 1.12 mg/dL (ref 0.50–1.35)
GFR calc Af Amer: 77 mL/min — ABNORMAL LOW (ref >90)
GFR calc non Af Amer: 67 mL/min — ABNORMAL LOW (ref >90)
Glucose, Bld: 129 mg/dL — ABNORMAL HIGH (ref 70–99)
Potassium: 3.2 mEq/L — ABNORMAL LOW (ref 3.5–5.1)
Sodium: 139 mEq/L (ref 135–145)

## 2012-12-13 LAB — GLUCOSE, CAPILLARY
Glucose-Capillary: 103 mg/dL — ABNORMAL HIGH (ref 70–99)
Glucose-Capillary: 105 mg/dL — ABNORMAL HIGH (ref 70–99)
Glucose-Capillary: 108 mg/dL — ABNORMAL HIGH (ref 70–99)
Glucose-Capillary: 110 mg/dL — ABNORMAL HIGH (ref 70–99)
Glucose-Capillary: 114 mg/dL — ABNORMAL HIGH (ref 70–99)
Glucose-Capillary: 115 mg/dL — ABNORMAL HIGH (ref 70–99)
Glucose-Capillary: 118 mg/dL — ABNORMAL HIGH (ref 70–99)
Glucose-Capillary: 119 mg/dL — ABNORMAL HIGH (ref 70–99)
Glucose-Capillary: 123 mg/dL — ABNORMAL HIGH (ref 70–99)
Glucose-Capillary: 125 mg/dL — ABNORMAL HIGH (ref 70–99)
Glucose-Capillary: 128 mg/dL — ABNORMAL HIGH (ref 70–99)
Glucose-Capillary: 131 mg/dL — ABNORMAL HIGH (ref 70–99)
Glucose-Capillary: 132 mg/dL — ABNORMAL HIGH (ref 70–99)
Glucose-Capillary: 133 mg/dL — ABNORMAL HIGH (ref 70–99)
Glucose-Capillary: 137 mg/dL — ABNORMAL HIGH (ref 70–99)
Glucose-Capillary: 138 mg/dL — ABNORMAL HIGH (ref 70–99)
Glucose-Capillary: 140 mg/dL — ABNORMAL HIGH (ref 70–99)
Glucose-Capillary: 142 mg/dL — ABNORMAL HIGH (ref 70–99)
Glucose-Capillary: 142 mg/dL — ABNORMAL HIGH (ref 70–99)
Glucose-Capillary: 166 mg/dL — ABNORMAL HIGH (ref 70–99)

## 2012-12-13 LAB — MAGNESIUM
Magnesium: 2.7 mg/dL — ABNORMAL HIGH (ref 1.5–2.5)
Magnesium: 2.3 mg/dL (ref 1.5–2.5)

## 2012-12-13 LAB — CBC
HCT: 28.8 % — ABNORMAL LOW (ref 39.0–52.0)
HCT: 33.1 % — ABNORMAL LOW (ref 39.0–52.0)
Hemoglobin: 10.2 g/dL — ABNORMAL LOW (ref 13.0–17.0)
Hemoglobin: 11.2 g/dL — ABNORMAL LOW (ref 13.0–17.0)
MCH: 30.4 pg (ref 26.0–34.0)
MCH: 29.9 pg (ref 26.0–34.0)
MCHC: 35.4 g/dL (ref 30.0–36.0)
MCHC: 33.8 g/dL (ref 30.0–36.0)
MCV: 85.7 fL (ref 78.0–100.0)
MCV: 88.5 fL (ref 78.0–100.0)
Platelets: 144 10*3/uL — ABNORMAL LOW (ref 150–400)
Platelets: 138 10*3/uL — ABNORMAL LOW (ref 150–400)
RBC: 3.36 MIL/uL — ABNORMAL LOW (ref 4.22–5.81)
RBC: 3.74 MIL/uL — ABNORMAL LOW (ref 4.22–5.81)
RDW: 13.6 % (ref 11.5–15.5)
RDW: 14 % (ref 11.5–15.5)
WBC: 10.2 10*3/uL (ref 4.0–10.5)
WBC: 10.6 10*3/uL — ABNORMAL HIGH (ref 4.0–10.5)

## 2012-12-13 LAB — POCT I-STAT, CHEM 8
BUN: 15 mg/dL (ref 6–23)
Calcium, Ion: 1.15 mmol/L (ref 1.13–1.30)
Chloride: 100 mEq/L (ref 96–112)
Creatinine, Ser: 1.2 mg/dL (ref 0.50–1.35)
Glucose, Bld: 145 mg/dL — ABNORMAL HIGH (ref 70–99)
HCT: 31 % — ABNORMAL LOW (ref 39.0–52.0)
Hemoglobin: 10.5 g/dL — ABNORMAL LOW (ref 13.0–17.0)
Potassium: 3.4 mEq/L — ABNORMAL LOW (ref 3.5–5.1)
Sodium: 139 mEq/L (ref 135–145)
TCO2: 25 mmol/L (ref 0–100)

## 2012-12-13 LAB — CREATININE, SERUM
Creatinine, Ser: 1.22 mg/dL (ref 0.50–1.35)
GFR calc Af Amer: 70 mL/min — ABNORMAL LOW (ref >90)
GFR calc non Af Amer: 60 mL/min — ABNORMAL LOW (ref >90)

## 2012-12-13 MED ORDER — POTASSIUM CHLORIDE 10 MEQ/50ML IV SOLN
10.0000 meq | INTRAVENOUS | Status: AC
  Administered 2012-12-13 (×2): 10 meq via INTRAVENOUS
  Filled 2012-12-13: qty 100

## 2012-12-13 MED ORDER — INSULIN GLARGINE 100 UNIT/ML ~~LOC~~ SOLN
30.0000 [IU] | Freq: Every day | SUBCUTANEOUS | Status: DC
  Administered 2012-12-13: 30 [IU] via SUBCUTANEOUS

## 2012-12-13 MED ORDER — POTASSIUM CHLORIDE 10 MEQ/50ML IV SOLN
10.0000 meq | INTRAVENOUS | Status: AC
  Administered 2012-12-13 (×3): 10 meq via INTRAVENOUS
  Filled 2012-12-13: qty 150

## 2012-12-13 MED ORDER — INSULIN ASPART 100 UNIT/ML ~~LOC~~ SOLN
0.0000 [IU] | SUBCUTANEOUS | Status: DC
  Administered 2012-12-13 – 2012-12-14 (×4): 2 [IU] via SUBCUTANEOUS

## 2012-12-13 MED ORDER — ENOXAPARIN SODIUM 40 MG/0.4ML ~~LOC~~ SOLN
40.0000 mg | SUBCUTANEOUS | Status: DC
  Administered 2012-12-13 – 2012-12-17 (×5): 40 mg via SUBCUTANEOUS
  Filled 2012-12-13 (×8): qty 0.4

## 2012-12-13 MED ORDER — FUROSEMIDE 10 MG/ML IJ SOLN
40.0000 mg | Freq: Once | INTRAMUSCULAR | Status: AC
  Administered 2012-12-13: 40 mg via INTRAVENOUS

## 2012-12-13 MED ORDER — POTASSIUM CHLORIDE 10 MEQ/50ML IV SOLN
10.0000 meq | INTRAVENOUS | Status: AC | PRN
  Administered 2012-12-13 (×3): 10 meq via INTRAVENOUS

## 2012-12-13 MED FILL — Dexmedetomidine HCl IV Soln 200 MCG/2ML: INTRAVENOUS | Qty: 2 | Status: AC

## 2012-12-13 MED FILL — Potassium Chloride Inj 2 mEq/ML: INTRAVENOUS | Qty: 40 | Status: AC

## 2012-12-13 MED FILL — Magnesium Sulfate Inj 50%: INTRAMUSCULAR | Qty: 10 | Status: AC

## 2012-12-13 NOTE — Progress Notes (Signed)
Patient ID: Hunter [name], male   DOB: 1946-02-24, 66 y.o.   MRN: [id number]                   [address]            ,[place] [address]          [phone]     1 Day Post-Op Procedure(s) (LRB): CORONARY ARTERY BYPASS GRAFTING (CABG) (N/A)  Total Length of Stay:  LOS: 1 day  BP 108/70  Pulse 78  Temp 98 F (36.7 C) (Oral)  Resp 16  Ht 5' 8.11" (1.73 m)  Wt 215 lb 2.7 oz (97.6 kg)  BMI 32.61 kg/m2  SpO2 92%  .Intake/Output      12/18 0701 - 12/19 0700   P.O. 250   I.V. (mL/kg) 348.5 (3.6)   Blood    NG/GT    IV Piggyback 250   Total Intake(mL/kg) 848.5 (8.7)   Urine (mL/kg/hr) 1810 (1.5)   Blood    Chest Tube 0   Total Output 1810   Net -961.5            . sodium chloride Stopped (12/13/12 0900)  . sodium chloride    . sodium chloride    . dexmedetomidine Stopped (12/12/12 2330)  . insulin (NOVOLIN-R) infusion Stopped (12/13/12 1230)  . lactated ringers Stopped (12/13/12 1718)  . nitroGLYCERIN    . phenylephrine (NEO-SYNEPHRINE) Adult infusion Stopped (12/13/12 1215)     Lab Results  Component Value Date   WBC 10.6* 12/13/2012   HGB 11.2* 12/13/2012   HCT 33.1* 12/13/2012   PLT 138* 12/13/2012   GLUCOSE 145* 12/13/2012   CHOL 172 10/26/2010   TRIG 329.0* 10/26/2010   HDL 38.10* 10/26/2010   LDLDIRECT 83.9 10/26/2010   ALT 21 12/08/2012   AST 21 12/08/2012   NA 139 12/13/2012   K 3.4* 12/13/2012   CL 100 12/13/2012   CREATININE 1.22 12/13/2012   BUN 15 12/13/2012   CO2 23 12/13/2012   TSH 2.14 10/26/2010   INR 1.52* 12/12/2012   HGBA1C 5.6 12/08/2012   Stable day Waked in unit  Delight Ovens MD  Beeper [phone] Office [phone] 12/13/2012 7:17 PM

## 2012-12-13 NOTE — Progress Notes (Signed)
UR Completed.  Hunter [name] Jane [phone] 12/13/2012  

## 2012-12-13 NOTE — Op Note (Signed)
Hunter [name], Hunter [name] NO.:  [id number]  MEDICAL RECORD NO.:  [id number]  LOCATION:  2311                         FACILITY:  [facility]  PHYSICIAN:  Salvatore Decent. Dorris Fetch, M.D.DATE OF BIRTH:  03/31/1946  DATE OF PROCEDURE:  12/12/2012 DATE OF DISCHARGE:                              OPERATIVE REPORT   PREOPERATIVE DIAGNOSIS:  Severe three-vessel coronary disease with atypical chest pain.  POSTOPERATIVE DIAGNOSIS:  Severe three-vessel coronary disease with atypical chest pain.  PROCEDURE:  Median sternotomy, extracorporeal circulation, coronary artery bypass grafting x4 (left internal mammary artery to LAD, sequential saphenous vein graft to obtuse marginals 1 and 2, saphenous vein graft to posterior descending).  SURGEON:  Salvatore Decent. Dorris Fetch, M.D.  ASSISTANT:  Kerin Perna, M.D.  SECOND ASSISTANT:  Coral Ceo, PA  ANESTHESIA:  General.  FINDINGS:  Posterolateral too small to graft.  Remaining targets good Quality. Good quality conduits.  CLINICAL NOTE:  [name] is a 66 year old gentleman with multiple cardiac risk factors and also a history of polio, who presented with atypical chest discomfort.  A nuclear treadmill showed reversible ischemia.  He underwent cardiac catheterization, which revealed severe three-vessel coronary disease with critical disease in his LAD.  He was advised to undergo coronary bypass grafting.  The indications, risks, benefits, and alternatives were discussed in detail with the patient. He understood and accepted the risks and agreed to proceed.  OPERATIVE NOTE:  [name] was brought to the preoperative holding area on December 12, 2012, where Anesthesia placed a Swan-Ganz catheter and arterial blood pressure monitoring line.  Intravenous antibiotics were administered.  He was then taken to the operating room, anesthetized, and intubated.  Foley catheter was placed.  The chest, abdomen, and legs were prepped and  draped in usual sterile fashion.  Median sternotomy was performed and the left internal mammary artery was harvested using standard technique.  Simultaneously, incision made in the medial aspect of the right leg at the level of the knee.  The greater saphenous vein was harvested from groin to upper calf endoscopically.  Both the mammary artery and saphenous vein were of good quality.  The patient was heparinized prior to dividing the distal end of the mammary artery.  After harvesting the conduit, the pericardium was opened.  The ascending aorta was inspected.  It was relatively short, but it was free of palpable atherosclerotic disease.  The aorta was cannulated via concentric 2-0 Ethibond pledgeted pursestring sutures.  A dual-stage venous cannula was placed via pursestring suture in the right atrial appendage.  Cardiopulmonary bypass was instituted.  The patient was cooled to 32 degrees Celsius.  Flows were maintained per protocol. Anticoagulation was monitored with ACT.  The coronary arteries were inspected and anastomotic sites were chosen.  The conduits were inspected and cut to length.  A foam pad was placed and the pericardium to insulate the heart and protect left phrenic nerve.  A temperature probe was placed in myocardial septum, and a cardioplegic cannula was placed in the ascending aorta.  The aorta was crossclamped.  The left ventricle was emptied via the aortic root vent. Cardiac arrest then was achieved with combination of cold antegrade, blood cardioplegia, and topical iced  saline.  The patient had a rapid diastolic arrest, but relatively slow septal cooling.  2 L of cardioplegia was administered.  There was septal cooling to 11 degrees Celsius.  The following distal anastomoses were performed.  First, a reversed saphenous vein graft was placed end-to-side to the posterior descending branch of the right coronary.  This was a 1.5 mm good quality target.  The vein was  of good quality.  The anastomosis was performed end-to-side with a running 7 Prolene suture.  At the completion of each anastomosis, it was probed proximally and distally to ensure patency.  Cardioplegia was administered down each vein graft at their completion to assess flow and hemostasis.  Next, a reversed saphenous vein graft was placed sequentially to obtuse marginals 1 and 2.  These were both 1.5 mm, good quality targets.  A side- to-side anastomosis was performed to OM1 and end-to-side to OM2.  Both were done with running 7-0 Prolene sutures.  There was excellent flow through the graft and good hemostasis at both anastomoses.  Next, the left internal mammary artery was brought through a window in the pericardium.  The distal end was beveled.  It was then anastomosed end-to-side to the distal LAD.  The LAD was a 1.5 mm good quality target site anastomosis.  The mammary was a 1.5 mm good quality conduit.  The end-to-side anastomosis was performed with a running 8-0 Prolene suture. At the completion of anastomosis, the bulldog clamp was briefly removed. Immediate and rapid septal rewarming was noted.  The bulldog clamp was replaced.  The mammary pedicle was tacked to the epicardial surface of the heart with 6-0 Prolene sutures.  Additional cardioplegia was administered.  The vein grafts were cut to length, and the proximal anastomoses were performed to 4.5 mm punch aortotomies with running 6-0 Prolene sutures.  At the completion of the final proximal anastomosis, the patient was placed in Trendelenburg position.  Lidocaine was administered.  The aortic root was de-aired, and the aortic crossclamp was removed.  The total crossclamp time was 72 minutes.  The patient required single defibrillation.  He was in sinus rhythm thereafter.  While rewarming was completed, all proximal and distal anastomoses were inspected for hemostasis.  Epicardial pacing wires were placed on the right  ventricle and right atrium.  When the patient had rewarmed to a core temperature of 37 degrees Celsius, he was weaned from cardiopulmonary bypass on the first attempt without difficulty and with no inotropic support.  Total bypass time was 106 minutes.  The test dose of protamine was administered and was well tolerated.  The atrial and aortic cannulae were removed.  The remainder of protamine was administered without incident.  The chest was irrigated with warm saline.  Hemostasis was achieved.  Left pleural and single mediastinal chest tubes were placed in separate subcostal incisions.  The pericardium was reapproximated with interrupted 3-0 silk sutures.  It came together easily without tension or kinking the underlying grafts.  The sternum was closed with interrupted heavy gauge stainless steel wires.  The pectoralis fascia, subcutaneous tissue, and skin were closed in standard fashion.  All sponge, needle, and instrument counts were correct at the end of the procedure.  There were no intraoperative complications.  The patient was taken from the operating room to the surgical intensive care unit in good condition.     Salvatore Decent Dorris Fetch, M.D.     SCH/MEDQ  D:  12/12/2012  T:  12/13/2012  Job:  [id number]

## 2012-12-13 NOTE — Anesthesia Postprocedure Evaluation (Signed)
  Anesthesia Post-op Note  Patient: Hunter [name]  Procedure(s) Performed: Procedure(s) (LRB) with comments: CORONARY ARTERY BYPASS GRAFTING (CABG) (N/A) - Coronary artery bypass grafting times four  using left internal mammary artery and right endovein harvest  Patient Location: ICU  Anesthesia Type:General  Level of Consciousness: awake, alert  and oriented  Airway and Oxygen Therapy: Patient Spontanous Breathing and Patient connected to nasal cannula oxygen  Post-op Pain: none  Post-op Assessment: Post-op Vital signs reviewed, Patient's Cardiovascular Status Stable, Respiratory Function Stable, RESPIRATORY FUNCTION UNSTABLE, Patent Airway, No signs of Nausea or vomiting, Adequate PO intake and Pain level controlled  Post-op Vital Signs: Reviewed and stable  Complications: No apparent anesthesia complications

## 2012-12-13 NOTE — Progress Notes (Signed)
1 Day Post-Op Procedure(s) (LRB): CORONARY ARTERY BYPASS GRAFTING (CABG) (N/A) Subjective: "It's worse than I thought it would be" Denies nausea  Objective: Vital signs in last 24 hours: Temp:  [95.9 F (35.5 C)-99.3 F (37.4 C)] 98.4 F (36.9 C) (12/18 0715) Pulse Rate:  [79-90] 89  (12/18 0715) Cardiac Rhythm:  [-] Atrial paced (12/18 0700) Resp:  [0-22] 0  (12/18 0715) BP: (96-118)/(53-65) 108/58 mmHg (12/18 0451) SpO2:  [99 %-100 %] 100 % (12/18 0715) Arterial Line BP: (95-134)/(52-76) 120/61 mmHg (12/18 0715) FiO2 (%):  [39.3 %-50.4 %] 40 % (12/18 0515) Weight:  [215 lb 2.7 oz (97.6 kg)] 215 lb 2.7 oz (97.6 kg) (12/18 0500)  Hemodynamic parameters for last 24 hours: PAP: (19-34)/(9-21) 32/19 mmHg CO:  [2.9 L/min-4.1 L/min] 4.1 L/min CI:  [1.4 L/min/m2-2 L/min/m2] 2 L/min/m2  Intake/Output from previous day: 12/17 0701 - 12/18 0700 In: 5743.8 [I.V.:3462.8; Blood:481; NG/GT:30; IV Piggyback:1770] Out: 2875 [Urine:1915; Blood:700; Chest Tube:260] Intake/Output this shift: Total I/O In: -  Out: 45 [Urine:45]  General appearance: alert and no distress Neurologic: at baseline Heart: regular rate and rhythm and faint rub Lungs: diminished breath sounds bibasilar Abdomen: normal findings: soft, non-tender  Lab Results:  Basename 12/13/12 0335 12/12/12 1830 12/12/12 1825  WBC 10.2 -- 11.8*  HGB 10.2* 11.2* --  HCT 28.8* 33.0* --  PLT 144* -- 121*   BMET:  Basename 12/13/12 0335 12/12/12 1830  NA 139 141  K 3.2* 2.8*  CL 103 --  CO2 23 --  GLUCOSE 129* 112*  BUN 15 --  CREATININE 1.12 --  CALCIUM 7.9* --    PT/INR:  Basename 12/12/12 1825  LABPROT 17.9*  INR 1.52*   ABG    Component Value Date/Time   PHART 7.389 12/13/2012 0648   HCO3 25.3* 12/13/2012 0648   TCO2 27 12/13/2012 0648   ACIDBASEDEF 2.0 12/13/2012 0515   O2SAT 98.0 12/13/2012 0648   CBG (last 3)   Basename 12/13/12 0358 12/13/12 0252 12/13/12 0147  GLUCAP 131* 138* 166*     Assessment/Plan: S/P Procedure(s) (LRB): CORONARY ARTERY BYPASS GRAFTING (CABG) (N/A) POD # 1 CABG CV- s/p CABG - good hemodynamics- d/c swan  ASA, beta blocker, statin  RESP- pulmonary hygiene  RENAL- hypokalemia- supplement  Volume overload- diurese  ENDO- CBG elevated, no history of DM- transition to lantus/ SSI  ANEMIA secondary to ABL- continue  D/C CT   LOS: 1 day    Makenley Shimp C 12/13/2012

## 2012-12-13 NOTE — Progress Notes (Addendum)
First attempt to wean from ventilator failed. Patient too sleepy on CPAP; needs constant prompting to stay awake and breath above CPAP 40/4 setting. Will attempt when patient more alert. A. Lamar Benes

## 2012-12-13 NOTE — Procedures (Signed)
Extubation Procedure Note  Patient Details:   Name: Ehab Humber DOB: [birth year] MRN: [id number]   Airway Documentation:  Airway 8 mm (Active)  Secured at (cm) 22 cm 12/13/2012  4:51 AM  Measured From Lips 12/13/2012  4:51 AM  Secured Location Right 12/13/2012  4:51 AM  Secured By Caron Presume Tape 12/13/2012  4:51 AM  Cuff Pressure (cm H2O) 28 cm H2O 12/12/2012  9:21 PM  Site Condition Dry 12/13/2012  4:51 AM    Evaluation  O2 sats: stable throughout Complications: No apparent complications Patient did tolerate procedure well. Bilateral Breath Sounds: Rhonchi Suctioning: Airway Yes  Filbert Schilder 12/13/2012, 5:27 AM  Patient was weaned, performed SBT, was able to breath around deflated cuff,  coached on deep breathing, cough and was extubated to a 4 liter nasal cannula.  No evidence of stridor present.

## 2012-12-14 ENCOUNTER — Inpatient Hospital Stay (HOSPITAL_COMMUNITY): Payer: Medicare Other

## 2012-12-14 LAB — GLUCOSE, CAPILLARY
Glucose-Capillary: 126 mg/dL — ABNORMAL HIGH (ref 70–99)
Glucose-Capillary: 115 mg/dL — ABNORMAL HIGH (ref 70–99)
Glucose-Capillary: 137 mg/dL — ABNORMAL HIGH (ref 70–99)
Glucose-Capillary: 113 mg/dL — ABNORMAL HIGH (ref 70–99)
Glucose-Capillary: 114 mg/dL — ABNORMAL HIGH (ref 70–99)

## 2012-12-14 LAB — BASIC METABOLIC PANEL
BUN: 17 mg/dL (ref 6–23)
CO2: 27 mEq/L (ref 19–32)
Calcium: 8.6 mg/dL (ref 8.4–10.5)
Chloride: 100 mEq/L (ref 96–112)
Creatinine, Ser: 1.23 mg/dL (ref 0.50–1.35)
GFR calc Af Amer: 69 mL/min — ABNORMAL LOW (ref >90)
GFR calc non Af Amer: 59 mL/min — ABNORMAL LOW (ref >90)
Glucose, Bld: 125 mg/dL — ABNORMAL HIGH (ref 70–99)
Potassium: 3.8 mEq/L (ref 3.5–5.1)
Sodium: 135 mEq/L (ref 135–145)

## 2012-12-14 LAB — CBC
HCT: 31.2 % — ABNORMAL LOW (ref 39.0–52.0)
Hemoglobin: 10.5 g/dL — ABNORMAL LOW (ref 13.0–17.0)
MCH: 29.7 pg (ref 26.0–34.0)
MCHC: 33.7 g/dL (ref 30.0–36.0)
MCV: 88.1 fL (ref 78.0–100.0)
Platelets: 130 10*3/uL — ABNORMAL LOW (ref 150–400)
RBC: 3.54 MIL/uL — ABNORMAL LOW (ref 4.22–5.81)
RDW: 14.2 % (ref 11.5–15.5)
WBC: 10 10*3/uL (ref 4.0–10.5)

## 2012-12-14 MED ORDER — AMIODARONE IV BOLUS ONLY 150 MG/100ML: 150.0000 mg | Freq: Once | INTRAVENOUS | Status: DC

## 2012-12-14 MED ORDER — FUROSEMIDE 10 MG/ML IJ SOLN
20.0000 mg | Freq: Two times a day (BID) | INTRAMUSCULAR | Status: AC
  Administered 2012-12-14 (×2): 20 mg via INTRAVENOUS
  Filled 2012-12-14: qty 2

## 2012-12-14 MED ORDER — ALPRAZOLAM 0.25 MG PO TABS
0.2500 mg | ORAL_TABLET | Freq: Three times a day (TID) | ORAL | Status: DC | PRN
  Administered 2012-12-14 – 2012-12-21 (×7): 0.25 mg via ORAL
  Filled 2012-12-14 (×8): qty 1

## 2012-12-14 MED ORDER — FUROSEMIDE 10 MG/ML IJ SOLN
INTRAMUSCULAR | Status: AC
  Filled 2012-12-14: qty 2

## 2012-12-14 MED ORDER — POTASSIUM CHLORIDE 10 MEQ/50ML IV SOLN
INTRAVENOUS | Status: AC
  Filled 2012-12-14: qty 200

## 2012-12-14 MED ORDER — POTASSIUM CHLORIDE 10 MEQ/50ML IV SOLN
10.0000 meq | INTRAVENOUS | Status: AC
  Administered 2012-12-14 (×4): 10 meq via INTRAVENOUS

## 2012-12-14 MED ORDER — ATORVASTATIN CALCIUM 20 MG PO TABS
20.0000 mg | ORAL_TABLET | Freq: Every day | ORAL | Status: DC
  Administered 2012-12-14 – 2012-12-20 (×7): 20 mg via ORAL
  Filled 2012-12-14 (×8): qty 1

## 2012-12-14 MED ORDER — INSULIN ASPART 100 UNIT/ML ~~LOC~~ SOLN
0.0000 [IU] | Freq: Three times a day (TID) | SUBCUTANEOUS | Status: DC
  Administered 2012-12-14: 2 [IU] via SUBCUTANEOUS

## 2012-12-14 MED ORDER — AMIODARONE HCL IN DEXTROSE 360-4.14 MG/200ML-% IV SOLN
30.0000 mg/h | INTRAVENOUS | Status: DC
  Administered 2012-12-15: 30 mg/h via INTRAVENOUS
  Filled 2012-12-14 (×5): qty 200

## 2012-12-14 MED ORDER — AMIODARONE HCL IN DEXTROSE 360-4.14 MG/200ML-% IV SOLN
60.0000 mg/h | INTRAVENOUS | Status: AC
  Administered 2012-12-14 (×2): 60 mg/h via INTRAVENOUS
  Filled 2012-12-14 (×2): qty 200

## 2012-12-14 MED ORDER — TRAMADOL HCL 50 MG PO TABS
50.0000 mg | ORAL_TABLET | Freq: Four times a day (QID) | ORAL | Status: DC | PRN
  Administered 2012-12-16 – 2012-12-20 (×3): 50 mg via ORAL
  Filled 2012-12-14 (×2): qty 1

## 2012-12-14 MED ORDER — METOPROLOL TARTRATE 25 MG PO TABS
25.0000 mg | ORAL_TABLET | Freq: Two times a day (BID) | ORAL | Status: DC
  Administered 2012-12-14 – 2012-12-17 (×7): 25 mg via ORAL
  Filled 2012-12-14 (×9): qty 1

## 2012-12-14 MED ORDER — TRAMADOL HCL 50 MG PO TABS
100.0000 mg | ORAL_TABLET | Freq: Four times a day (QID) | ORAL | Status: DC | PRN
  Administered 2012-12-16 – 2012-12-20 (×5): 100 mg via ORAL
  Filled 2012-12-14 (×6): qty 2

## 2012-12-14 MED ORDER — AMIODARONE LOAD VIA INFUSION
150.0000 mg | Freq: Once | INTRAVENOUS | Status: AC
  Administered 2012-12-14: 150 mg via INTRAVENOUS
  Filled 2012-12-14: qty 83.34

## 2012-12-14 MED FILL — Lidocaine HCl IV Inj 20 MG/ML: INTRAVENOUS | Qty: 5 | Status: AC

## 2012-12-14 MED FILL — Mannitol IV Soln 20%: INTRAVENOUS | Qty: 500 | Status: AC

## 2012-12-14 MED FILL — Heparin Sodium (Porcine) Inj 1000 Unit/ML: INTRAMUSCULAR | Qty: 10 | Status: AC

## 2012-12-14 MED FILL — Electrolyte-R (PH 7.4) Solution: INTRAVENOUS | Qty: 4000 | Status: AC

## 2012-12-14 MED FILL — Heparin Sodium (Porcine) Inj 1000 Unit/ML: INTRAMUSCULAR | Qty: 30 | Status: AC

## 2012-12-14 MED FILL — Sodium Bicarbonate IV Soln 8.4%: INTRAVENOUS | Qty: 50 | Status: AC

## 2012-12-14 MED FILL — Sodium Chloride IV Soln 0.9%: INTRAVENOUS | Qty: 1000 | Status: AC

## 2012-12-14 NOTE — Progress Notes (Signed)
[facility]  DAILY PROGRESS NOTE   Subjective:  Doing well two days postop. Off all pressors. Only adverse event has been onset of AF with RVR. Still with mild RVR despite iv amiodarone (110-115 bpm).  Objective:  Temp:  [97.5 F (36.4 C)-98.7 F (37.1 C)] 97.5 F (36.4 C) (12/19 1233) Pulse Rate:  [53-143] 56  (12/19 1300) Resp:  [3-23] 20  (12/19 1300) BP: (95-122)/(63-90) 109/72 mmHg (12/19 1300) SpO2:  [91 %-99 %] 95 % (12/19 1300) Weight:  [97.9 kg (215 lb 13.3 oz)] 97.9 kg (215 lb 13.3 oz) (12/19 0600) Weight change: 0.3 kg (10.6 oz)  Intake/Output from previous day: 12/18 0701 - 12/19 0700 In: 1508.5 [P.O.:550; I.V.:558.5; IV Piggyback:400] Out: 2255 [Urine:2255]  Intake/Output from this shift: Total I/O In: 332.6 [P.O.:60; I.V.:272.6] Out: 55 [Urine:55]  Medications: Current Facility-Administered Medications  Medication Dose Route Frequency Provider Last Rate Last Dose  . 0.45 % sodium chloride infusion   Intravenous Continuous Wilmon Pali, PA      . 0.9 %  sodium chloride infusion   Intravenous Continuous Wilmon Pali, PA      . 0.9 %  sodium chloride infusion  250 mL Intravenous Continuous Wilmon Pali, PA      . acetaminophen (TYLENOL) tablet 1,000 mg  1,000 mg Oral Q6H Wilmon Pali, PA   1,000 mg at 12/14/12 1145   Or  . acetaminophen (TYLENOL) solution 975 mg  975 mg Per Tube Q6H Wilmon Pali, PA      . allopurinol (ZYLOPRIM) tablet 300 mg  300 mg Oral Daily Wilmon Pali, PA   300 mg at 12/14/12 1017  . ALPRAZolam (XANAX) tablet 0.25 mg  0.25 mg Oral TID PRN Lowella Dandy, PA   0.25 mg at 12/14/12 0825  . amiodarone (NEXTERONE PREMIX) 360 mg/200 mL dextrose IV infusion  30 mg/hr Intravenous Continuous Erin Barrett, PA      . aspirin EC tablet 325 mg  325 mg Oral Daily Wilmon Pali, PA   325 mg at 12/14/12 1017   Or  . aspirin chewable tablet 324 mg  324 mg Per Tube Daily Wilmon Pali, PA      . atorvastatin  (LIPITOR) tablet 20 mg  20 mg Oral q1800 Loreli Slot, MD      . bisacodyl (DULCOLAX) EC tablet 10 mg  10 mg Oral Daily Wilmon Pali, PA   10 mg at 12/14/12 1017   Or  . bisacodyl (DULCOLAX) suppository 10 mg  10 mg Rectal Daily Wilmon Pali, PA      . docusate sodium (COLACE) capsule 200 mg  200 mg Oral Daily Wilmon Pali, PA   200 mg at 12/14/12 1017  . enoxaparin (LOVENOX) injection 40 mg  40 mg Subcutaneous Q24H Loreli Slot, MD   40 mg at 12/14/12 1017  . furosemide (LASIX) injection 20 mg  20 mg Intravenous BID Loreli Slot, MD   20 mg at 12/14/12 1018  . insulin aspart (novoLOG) injection 0-15 Units  0-15 Units Subcutaneous TID WC Loreli Slot, MD   2 Units at 12/14/12 1324  . lactated ringers infusion   Intravenous Continuous Wilmon Pali, PA 20 mL/hr at 12/14/12 0400    . metoprolol (LOPRESSOR) injection 2.5-5 mg  2.5-5 mg Intravenous Q2H PRN Wilmon Pali, PA   2.5 mg at 12/14/12 1008  . metoprolol tartrate (LOPRESSOR) tablet 25 mg  25 mg  Oral BID Loreli Slot, MD   25 mg at 12/14/12 1017  . midazolam (VERSED) injection 2 mg  2 mg Intravenous Q1H PRN Wilmon Pali, PA      . morphine 2 MG/ML injection 2-5 mg  2-5 mg Intravenous Q1H PRN Wilmon Pali, PA   4 mg at 12/14/12 0643  . mupirocin ointment (BACTROBAN) 2 %   Nasal BID Loreli Slot, MD      . ondansetron [facility]) injection 4 mg  4 mg Intravenous Q6H PRN Wilmon Pali, PA   4 mg at 12/14/12 1336  . pantoprazole (PROTONIX) EC tablet 40 mg  40 mg Oral Daily Wilmon Pali, PA   40 mg at 12/14/12 1017  . potassium chloride 10 MEQ/50ML IVPB           . sodium chloride 0.9 % injection 3 mL  3 mL Intravenous Q12H Wilmon Pali, PA   3 mL at 12/13/12 2200  . sodium chloride 0.9 % injection 3 mL  3 mL Intravenous PRN Wilmon Pali, PA      . traMADol Janean Sark) tablet 100 mg  100 mg Oral Q6H PRN Loreli Slot, MD      . traMADol Janean Sark) tablet 50 mg  50 mg Oral Q6H PRN  Loreli Slot, MD        Physical Exam: General appearance: alert, mild distress and frail and weak Neck: no adenopathy, no carotid bruit, no JVD, supple, symmetrical, trachea midline and thyroid not enlarged, symmetric, no tenderness/mass/nodules Lungs: clear to auscultation bilaterally Heart: irregularly irregular rhythm Abdomen: soft, non-tender; bowel sounds normal; no masses,  no organomegaly Extremities: extremities normal, atraumatic, no cyanosis or edema Pulses: 2+ and symmetric Neurologic: Grossly normal  Lab Results: Results for orders placed during the hospital encounter of 12/12/12 (from the past 48 hour(s))  POCT I-STAT 4, (NA,K, GLUC, HGB,HCT)     Status: Abnormal   Collection Time   12/12/12  3:13 PM      Component Value Range Comment   Sodium 138  135 - 145 mEq/L    Potassium 3.2 (*) 3.5 - 5.1 mEq/L    Glucose, Bld 102 (*) 70 - 99 mg/dL    HCT 16.1 (*) 09.6 - 52.0 %    Hemoglobin 11.9 (*) 13.0 - 17.0 g/dL   POCT I-STAT 4, (NA,K, GLUC, HGB,HCT)     Status: Abnormal   Collection Time   12/12/12  3:40 PM      Component Value Range Comment   Sodium 135  135 - 145 mEq/L    Potassium 2.9 (*) 3.5 - 5.1 mEq/L    Glucose, Bld 94  70 - 99 mg/dL    HCT 04.5 (*) 40.9 - 52.0 %    Hemoglobin 9.5 (*) 13.0 - 17.0 g/dL   POCT I-STAT 3, BLOOD GAS (G3+)     Status: Abnormal   Collection Time   12/12/12  3:44 PM      Component Value Range Comment   pH, Arterial 7.490 (*) 7.350 - 7.450    pCO2 arterial 37.2  35.0 - 45.0 mmHg    pO2, Arterial 319.0 (*) 80.0 - 100.0 mmHg    Bicarbonate 28.3 (*) 20.0 - 24.0 mEq/L    TCO2 29  0 - 100 mmol/L    O2 Saturation 100.0      Acid-Base Excess 5.0 (*) 0.0 - 2.0 mmol/L    Sample type ARTERIAL     HEMOGLOBIN AND HEMATOCRIT, BLOOD  Status: Abnormal   Collection Time   12/12/12  4:31 PM      Component Value Range Comment   Hemoglobin 10.7 (*) 13.0 - 17.0 g/dL    HCT 13.0 (*) 86.5 - 52.0 %   PLATELET COUNT     Status: Normal    Collection Time   12/12/12  4:31 PM      Component Value Range Comment   Platelets 160  150 - 400 K/uL   POCT I-STAT 4, (NA,K, GLUC, HGB,HCT)     Status: Abnormal   Collection Time   12/12/12  4:36 PM      Component Value Range Comment   Sodium 136  135 - 145 mEq/L    Potassium 3.9  3.5 - 5.1 mEq/L    Glucose, Bld 128 (*) 70 - 99 mg/dL    HCT 78.4 (*) 69.6 - 52.0 %    Hemoglobin 10.5 (*) 13.0 - 17.0 g/dL   POCT I-STAT 4, (NA,K, GLUC, HGB,HCT)     Status: Abnormal   Collection Time   12/12/12  5:35 PM      Component Value Range Comment   Sodium 140  135 - 145 mEq/L    Potassium 2.9 (*) 3.5 - 5.1 mEq/L    Glucose, Bld 126 (*) 70 - 99 mg/dL    HCT 29.5 (*) 28.4 - 52.0 %    Hemoglobin 8.8 (*) 13.0 - 17.0 g/dL   POCT I-STAT 3, BLOOD GAS (G3+)     Status: Abnormal   Collection Time   12/12/12  5:39 PM      Component Value Range Comment   pH, Arterial 7.489 (*) 7.350 - 7.450    pCO2 arterial 31.0 (*) 35.0 - 45.0 mmHg    pO2, Arterial 182.0 (*) 80.0 - 100.0 mmHg    Bicarbonate 23.5  20.0 - 24.0 mEq/L    TCO2 24  0 - 100 mmol/L    O2 Saturation 100.0      Sample type ARTERIAL     CBC     Status: Abnormal   Collection Time   12/12/12  6:25 PM      Component Value Range Comment   WBC 11.8 (*) 4.0 - 10.5 K/uL    RBC 3.72 (*) 4.22 - 5.81 MIL/uL    Hemoglobin 11.1 (*) 13.0 - 17.0 g/dL    HCT 13.2 (*) 44.0 - 52.0 %    MCV 85.8  78.0 - 100.0 fL    MCH 29.8  26.0 - 34.0 pg    MCHC 34.8  30.0 - 36.0 g/dL    RDW 10.2  72.5 - 36.6 %    Platelets 121 (*) 150 - 400 K/uL DELTA CHECK NOTED  PROTIME-INR     Status: Abnormal   Collection Time   12/12/12  6:25 PM      Component Value Range Comment   Prothrombin Time 17.9 (*) 11.6 - 15.2 seconds    INR 1.52 (*) 0.00 - 1.49   APTT     Status: Normal   Collection Time   12/12/12  6:25 PM      Component Value Range Comment   aPTT 34  24 - 37 seconds   POCT I-STAT 4, (NA,K, GLUC, HGB,HCT)     Status: Abnormal   Collection Time   12/12/12   6:30 PM      Component Value Range Comment   Sodium 141  135 - 145 mEq/L    Potassium 2.8 (*) 3.5 - 5.1  mEq/L    Glucose, Bld 112 (*) 70 - 99 mg/dL    HCT 09.8 (*) 11.9 - 52.0 %    Hemoglobin 11.2 (*) 13.0 - 17.0 g/dL   POCT I-STAT 3, BLOOD GAS (G3+)     Status: Abnormal   Collection Time   12/12/12  6:41 PM      Component Value Range Comment   pH, Arterial 7.445  7.350 - 7.450    pCO2 arterial 35.0  35.0 - 45.0 mmHg    pO2, Arterial 142.0 (*) 80.0 - 100.0 mmHg    Bicarbonate 24.3 (*) 20.0 - 24.0 mEq/L    TCO2 25  0 - 100 mmol/L    O2 Saturation 99.0      Patient temperature 36.0 C      Collection site RADIAL, ALLEN'S TEST ACCEPTABLE      Drawn by Operator      Sample type ARTERIAL     GLUCOSE, CAPILLARY     Status: Abnormal   Collection Time   12/12/12  7:45 PM      Component Value Range Comment   Glucose-Capillary 103 (*) 70 - 99 mg/dL   GLUCOSE, CAPILLARY     Status: Abnormal   Collection Time   12/12/12  8:47 PM      Component Value Range Comment   Glucose-Capillary 105 (*) 70 - 99 mg/dL   GLUCOSE, CAPILLARY     Status: Abnormal   Collection Time   12/12/12  9:45 PM      Component Value Range Comment   Glucose-Capillary 108 (*) 70 - 99 mg/dL   GLUCOSE, CAPILLARY     Status: Abnormal   Collection Time   12/12/12 10:52 PM      Component Value Range Comment   Glucose-Capillary 115 (*) 70 - 99 mg/dL   GLUCOSE, CAPILLARY     Status: Abnormal   Collection Time   12/12/12 11:53 PM      Component Value Range Comment   Glucose-Capillary 140 (*) 70 - 99 mg/dL   GLUCOSE, CAPILLARY     Status: Abnormal   Collection Time   12/13/12 12:50 AM      Component Value Range Comment   Glucose-Capillary 125 (*) 70 - 99 mg/dL   GLUCOSE, CAPILLARY     Status: Abnormal   Collection Time   12/13/12  1:47 AM      Component Value Range Comment   Glucose-Capillary 166 (*) 70 - 99 mg/dL   GLUCOSE, CAPILLARY     Status: Abnormal   Collection Time   12/13/12  2:52 AM      Component  Value Range Comment   Glucose-Capillary 138 (*) 70 - 99 mg/dL   CBC     Status: Abnormal   Collection Time   12/13/12  3:35 AM      Component Value Range Comment   WBC 10.2  4.0 - 10.5 K/uL    RBC 3.36 (*) 4.22 - 5.81 MIL/uL    Hemoglobin 10.2 (*) 13.0 - 17.0 g/dL    HCT 14.7 (*) 82.9 - 52.0 %    MCV 85.7  78.0 - 100.0 fL    MCH 30.4  26.0 - 34.0 pg    MCHC 35.4  30.0 - 36.0 g/dL    RDW 56.2  13.0 - 86.5 %    Platelets 144 (*) 150 - 400 K/uL   BASIC METABOLIC PANEL     Status: Abnormal   Collection Time   12/13/12  3:35 AM  Component Value Range Comment   Sodium 139  135 - 145 mEq/L    Potassium 3.2 (*) 3.5 - 5.1 mEq/L    Chloride 103  96 - 112 mEq/L    CO2 23  19 - 32 mEq/L    Glucose, Bld 129 (*) 70 - 99 mg/dL    BUN 15  6 - 23 mg/dL    Creatinine, Ser 1.47  0.50 - 1.35 mg/dL    Calcium 7.9 (*) 8.4 - 10.5 mg/dL    GFR calc non Af Amer 67 (*) >90 mL/min    GFR calc Af Amer 77 (*) >90 mL/min   MAGNESIUM     Status: Abnormal   Collection Time   12/13/12  3:35 AM      Component Value Range Comment   Magnesium 2.7 (*) 1.5 - 2.5 mg/dL   GLUCOSE, CAPILLARY     Status: Abnormal   Collection Time   12/13/12  3:58 AM      Component Value Range Comment   Glucose-Capillary 131 (*) 70 - 99 mg/dL   GLUCOSE, CAPILLARY     Status: Abnormal   Collection Time   12/13/12  4:58 AM      Component Value Range Comment   Glucose-Capillary 118 (*) 70 - 99 mg/dL   POCT I-STAT 3, BLOOD GAS (G3+)     Status: Abnormal   Collection Time   12/13/12  5:15 AM      Component Value Range Comment   pH, Arterial 7.376  7.350 - 7.450    pCO2 arterial 40.0  35.0 - 45.0 mmHg    pO2, Arterial 131.0 (*) 80.0 - 100.0 mmHg    Bicarbonate 23.4  20.0 - 24.0 mEq/L    TCO2 25  0 - 100 mmol/L    O2 Saturation 99.0      Acid-base deficit 2.0  0.0 - 2.0 mmol/L    Patient temperature 37.0 C      Sample type ARTERIAL     GLUCOSE, CAPILLARY     Status: Abnormal   Collection Time   12/13/12  5:46 AM       Component Value Range Comment   Glucose-Capillary 132 (*) 70 - 99 mg/dL   POCT I-STAT 3, BLOOD GAS (G3+)     Status: Abnormal   Collection Time   12/13/12  6:48 AM      Component Value Range Comment   pH, Arterial 7.389  7.350 - 7.450    pCO2 arterial 41.8  35.0 - 45.0 mmHg    pO2, Arterial 104.0 (*) 80.0 - 100.0 mmHg    Bicarbonate 25.3 (*) 20.0 - 24.0 mEq/L    TCO2 27  0 - 100 mmol/L    O2 Saturation 98.0      Patient temperature 36.9 C      Sample type ARTERIAL     GLUCOSE, CAPILLARY     Status: Abnormal   Collection Time   12/13/12  6:57 AM      Component Value Range Comment   Glucose-Capillary 119 (*) 70 - 99 mg/dL   GLUCOSE, CAPILLARY     Status: Abnormal   Collection Time   12/13/12  8:05 AM      Component Value Range Comment   Glucose-Capillary 114 (*) 70 - 99 mg/dL   GLUCOSE, CAPILLARY     Status: Abnormal   Collection Time   12/13/12  9:11 AM      Component Value Range Comment   Glucose-Capillary 142 (*) 70 -  99 mg/dL   GLUCOSE, CAPILLARY     Status: Abnormal   Collection Time   12/13/12 10:10 AM      Component Value Range Comment   Glucose-Capillary 142 (*) 70 - 99 mg/dL   GLUCOSE, CAPILLARY     Status: Abnormal   Collection Time   12/13/12 11:19 AM      Component Value Range Comment   Glucose-Capillary 128 (*) 70 - 99 mg/dL   GLUCOSE, CAPILLARY     Status: Abnormal   Collection Time   12/13/12 12:29 PM      Component Value Range Comment   Glucose-Capillary 110 (*) 70 - 99 mg/dL    Comment 1 Documented in Chart      Comment 2 Notify RN     POCT I-STAT, CHEM 8     Status: Abnormal   Collection Time   12/13/12  4:19 PM      Component Value Range Comment   Sodium 139  135 - 145 mEq/L    Potassium 3.4 (*) 3.5 - 5.1 mEq/L    Chloride 100  96 - 112 mEq/L    BUN 15  6 - 23 mg/dL    Creatinine, Ser 1.61  0.50 - 1.35 mg/dL    Glucose, Bld 096 (*) 70 - 99 mg/dL    Calcium, Ion 0.45  4.09 - 1.30 mmol/L    TCO2 25  0 - 100 mmol/L    Hemoglobin 10.5 (*) 13.0 -  17.0 g/dL    HCT 81.1 (*) 91.4 - 52.0 %   MAGNESIUM     Status: Normal   Collection Time   12/13/12  4:20 PM      Component Value Range Comment   Magnesium 2.3  1.5 - 2.5 mg/dL   CBC     Status: Abnormal   Collection Time   12/13/12  4:20 PM      Component Value Range Comment   WBC 10.6 (*) 4.0 - 10.5 K/uL    RBC 3.74 (*) 4.22 - 5.81 MIL/uL    Hemoglobin 11.2 (*) 13.0 - 17.0 g/dL    HCT 78.2 (*) 95.6 - 52.0 %    MCV 88.5  78.0 - 100.0 fL    MCH 29.9  26.0 - 34.0 pg    MCHC 33.8  30.0 - 36.0 g/dL    RDW 21.3  08.6 - 57.8 %    Platelets 138 (*) 150 - 400 K/uL   CREATININE, SERUM     Status: Abnormal   Collection Time   12/13/12  4:20 PM      Component Value Range Comment   Creatinine, Ser 1.22  0.50 - 1.35 mg/dL    GFR calc non Af Amer 60 (*) >90 mL/min    GFR calc Af Amer 70 (*) >90 mL/min   GLUCOSE, CAPILLARY     Status: Abnormal   Collection Time   12/13/12  4:47 PM      Component Value Range Comment   Glucose-Capillary 133 (*) 70 - 99 mg/dL    Comment 1 Documented in Chart      Comment 2 Notify RN     GLUCOSE, CAPILLARY     Status: Abnormal   Collection Time   12/13/12  8:25 PM      Component Value Range Comment   Glucose-Capillary 123 (*) 70 - 99 mg/dL    Comment 1 Notify RN      Comment 2 Documented in Chart     GLUCOSE, CAPILLARY  Status: Abnormal   Collection Time   12/13/12 11:40 PM      Component Value Range Comment   Glucose-Capillary 137 (*) 70 - 99 mg/dL    Comment 1 Notify RN      Comment 2 Documented in Chart     BASIC METABOLIC PANEL     Status: Abnormal   Collection Time   12/14/12  3:55 AM      Component Value Range Comment   Sodium 135  135 - 145 mEq/L    Potassium 3.8  3.5 - 5.1 mEq/L    Chloride 100  96 - 112 mEq/L    CO2 27  19 - 32 mEq/L    Glucose, Bld 125 (*) 70 - 99 mg/dL    BUN 17  6 - 23 mg/dL    Creatinine, Ser 1.61  0.50 - 1.35 mg/dL    Calcium 8.6  8.4 - 09.6 mg/dL    GFR calc non Af Amer 59 (*) >90 mL/min    GFR calc Af  Amer 69 (*) >90 mL/min   CBC     Status: Abnormal   Collection Time   12/14/12  3:55 AM      Component Value Range Comment   WBC 10.0  4.0 - 10.5 K/uL    RBC 3.54 (*) 4.22 - 5.81 MIL/uL    Hemoglobin 10.5 (*) 13.0 - 17.0 g/dL    HCT 04.5 (*) 40.9 - 52.0 %    MCV 88.1  78.0 - 100.0 fL    MCH 29.7  26.0 - 34.0 pg    MCHC 33.7  30.0 - 36.0 g/dL    RDW 81.1  91.4 - 78.2 %    Platelets 130 (*) 150 - 400 K/uL   GLUCOSE, CAPILLARY     Status: Abnormal   Collection Time   12/14/12  3:57 AM      Component Value Range Comment   Glucose-Capillary 126 (*) 70 - 99 mg/dL   GLUCOSE, CAPILLARY     Status: Abnormal   Collection Time   12/14/12  8:05 AM      Component Value Range Comment   Glucose-Capillary 115 (*) 70 - 99 mg/dL    Comment 1 Documented in Chart      Comment 2 Notify RN       Imaging: Dg Chest Portable 1 View In Am  12/14/2012  *RADIOLOGY REPORT*  Clinical Data: Postop CABG, follow  PORTABLE CHEST - 1 VIEW  Comparison: Portable chest x-ray of 12/13/2012  Findings: Aeration of the lungs has improved slightly.  The left chest tube has been removed and no pneumothorax is seen.  Left basilar opacity remains and cardiomegaly is stable.  The Swan-Ganz catheter has been removed and a venous sheath remains in the SVC.  IMPRESSION: Minimally improved aeration.  Left chest tube removed.  No pneumothorax.  Swan-Ganz catheter removed as well.   Original Report Authenticated By: Dwyane Dee, M.D.    Dg Chest Portable 1 View In Am  12/13/2012  *RADIOLOGY REPORT*  Clinical Data: Coronary artery bypass graft.  Postop.  PORTABLE CHEST - 1 VIEW  Comparison: 1 day prior  Findings: Repositioning of right IJ Swan-Ganz catheter, now at the proximal right pulmonary artery.  Removal of endotracheal and nasogastric tubes.  A mediastinal drain and left-sided chest tube remain.  Cardiomegaly accentuated by AP portable technique.  No pleural effusion or pneumothorax.  Lung volumes are low.  This accentuates the  pulmonary interstitium. No congestive failure.  Patchy lower lobe predominant atelectasis.  IMPRESSION:  1.  Repositioning of Swan-Ganz catheter, now appropriately position. 2.  Extubation and removal of nasogastric tube. 3.  Low lung volumes with bibasilar atelectasis, but no pneumothorax.   Original Report Authenticated By: Jeronimo Greaves, M.D.    Dg Chest Portable 1 View  12/12/2012  *RADIOLOGY REPORT*  Clinical Data: Coronary artery disease.  Postop from CABG.  PORTABLE CHEST - 1 VIEW  Comparison: 12/01/2012  Findings: Patient has undergone coronary artery bypass grafting. Endotracheal tube, nasogastric tube, mediastinal drain and left chest tube are seen in appropriate position.  No pneumothorax identified.  Swan-Ganz catheter is coiled within the right heart.  Bibasilar atelectasis is seen, left side greater than right.  Heart size is stable.  IMPRESSION:  1.  Abnormal position of Swan-Ganz catheter which is coiled within the right heart. 2.  Bibasilar atelectasis. 3.  No evidence of pneumothorax.  Critical Value/emergent results were called by telephone at the time of interpretation on 12/12/2012 at 1910 hours to the floor nurse Lelon Mast, who verbally acknowledged these results.   Original Report Authenticated By: Myles Rosenthal, M.D.     Assessment:  1. Active Problems: 2.  * No active hospital problems. *  3.   Plan:  1. Good postop progress after multivessel CABG. AF with RVR, recently started on iv amiodarone. Will probably need amiodarone for at least 30 days psotop. Premature to consider anticoagulation.  Time Spent Directly with Patient:  30 minutes  Length of Stay:  LOS: 2 days    Hunter [name] 12/14/2012, 2:26 PM

## 2012-12-14 NOTE — Progress Notes (Addendum)
2 Days Post-Op Procedure(s) (LRB): CORONARY ARTERY BYPASS GRAFTING (CABG) (N/A) Subjective:  Patient with Atrial Fibrillation this morning with RVR  Patient with complaints of not sleeping and having anxiety this morning.  He states he is so nervous that he is sweating all of the time.  He is requesting Xanax and a sleeping pill.  He states he wants to stay in ICU today as well.    Objective: Vital signs in last 24 hours: Temp:  [97.6 F (36.4 C)-98.8 F (37.1 C)] 97.6 F (36.4 C) (12/19 0807) Pulse Rate:  [73-143] 143  (12/19 0700) Cardiac Rhythm:  [-] Atrial fibrillation (12/19 0700) Resp:  [3-23] 10  (12/19 0700) BP: (96-122)/(63-80) 96/66 mmHg (12/19 0700) SpO2:  [92 %-100 %] 94 % (12/19 0700) Arterial Line BP: (121-132)/(59-65) 121/59 mmHg (12/18 1200) Weight:  [215 lb 13.3 oz (97.9 kg)] 215 lb 13.3 oz (97.9 kg) (12/19 0600)  Hemodynamic parameters for last 24 hours: PAP: (25-40)/(15-24) 26/15 mmHg  Intake/Output from previous day: 12/18 0701 - 12/19 0700 In: 1508.5 [P.O.:550; I.V.:558.5; IV Piggyback:400] Out: 2255 [Urine:2255]  General appearance: alert, cooperative and no distress Heart: irregularly irregular rhythm Lungs: clear to auscultation bilaterally Abdomen: soft, non-tender; bowel sounds normal; no masses,  no organomegaly Extremities: edema trace Wound: clean and ry  Lab Results:  Basename 12/14/12 0355 12/13/12 1620  WBC 10.0 10.6*  HGB 10.5* 11.2*  HCT 31.2* 33.1*  PLT 130* 138*   BMET:  Basename 12/14/12 0355 12/13/12 1620 12/13/12 1619 12/13/12 0335  NA 135 -- 139 --  K 3.8 -- 3.4* --  CL 100 -- 100 --  CO2 27 -- -- 23  GLUCOSE 125* -- 145* --  BUN 17 -- 15 --  CREATININE 1.23 1.22 -- --  CALCIUM 8.6 -- -- 7.9*    PT/INR:  Basename 12/12/12 1825  LABPROT 17.9*  INR 1.52*   ABG    Component Value Date/Time   PHART 7.389 12/13/2012 0648   HCO3 25.3* 12/13/2012 0648   TCO2 25 12/13/2012 1619   ACIDBASEDEF 2.0 12/13/2012 0515   O2SAT 98.0 12/13/2012 0648   CBG (last 3)   Basename 12/14/12 0357 12/13/12 2340 12/13/12 2025  GLUCAP 126* 137* 123*    Assessment/Plan: S/P Procedure(s) (LRB): CORONARY ARTERY BYPASS GRAFTING (CABG) (N/A)  1. CV- Rapid Atrial Fibrillation this morning- will start IV amiodarone bolus/infusion, continue low dose lopressor 2. Pulm- CXR with some bibasilar atelectasis, no pneumothorax, continue to wean oxygen, IS 3. Renal- creatinine slightly elevated at 1.23 which is patients baseline 4. Volume Overload-weight up since surgery, diurese 5.  Acute post operative anemia- stable 6. CBGs- pretty well controlled, patient not a diabetic hopefully can d/c insulin soon 7. Anxiety- will order Xanax 0.25mg  TID prn 8. Dispo- patient with rapid Atrial Fibrillation this morning, started xanax for anxiety, possibly remove foley   LOS: 2 days    BARRETT, ERIN 12/14/2012   Patient seen and examined. Agree with above.  In A fib with RVR this AM- lopressor and amiodarone

## 2012-12-14 NOTE — CV Procedure (Signed)
Hunter [name],Hunter [name] Male, 66 y.o., 10/20/1946  Location: [facility]  Bed: NONE  MRN: [id number]  CSN: [id number]  Admit Dt: 12/08/12  Late entry  A left subclavian arteriogram was performed during left heart catheterization in anticipation of bypass surgery. The LIMA was patent and healthy.  Thurmon Fair, MD, [facility] [phone] office [phone] pager

## 2012-12-14 NOTE — Progress Notes (Signed)
Patient ID: Hunter [name], male   DOB: 02/16/1946, 66 y.o.   MRN: [id number]  SICU Evening Rounds:  Hemodynamically stable. A-fib 120's on IV amio Awake and alert, up in chair Urine output good  Continue present course.

## 2012-12-15 ENCOUNTER — Encounter (HOSPITAL_COMMUNITY): Payer: Self-pay | Admitting: Cardiology

## 2012-12-15 DIAGNOSIS — Z951 Presence of aortocoronary bypass graft: Secondary | ICD-10-CM

## 2012-12-15 DIAGNOSIS — I9789 Other postprocedural complications and disorders of the circulatory system, not elsewhere classified: Secondary | ICD-10-CM | POA: Diagnosis not present

## 2012-12-15 DIAGNOSIS — I2510 Atherosclerotic heart disease of native coronary artery without angina pectoris: Secondary | ICD-10-CM | POA: Diagnosis present

## 2012-12-15 DIAGNOSIS — I480 Paroxysmal atrial fibrillation: Secondary | ICD-10-CM | POA: Diagnosis not present

## 2012-12-15 DIAGNOSIS — I208 Other forms of angina pectoris: Secondary | ICD-10-CM | POA: Diagnosis present

## 2012-12-15 DIAGNOSIS — I4891 Unspecified atrial fibrillation: Secondary | ICD-10-CM | POA: Diagnosis not present

## 2012-12-15 DIAGNOSIS — I2089 Other forms of angina pectoris: Secondary | ICD-10-CM | POA: Diagnosis present

## 2012-12-15 HISTORY — DX: Presence of aortocoronary bypass graft: Z95.1

## 2012-12-15 LAB — BASIC METABOLIC PANEL WITH GFR
BUN: 22 mg/dL (ref 6–23)
CO2: 29 meq/L (ref 19–32)
Calcium: 8.8 mg/dL (ref 8.4–10.5)
Chloride: 100 meq/L (ref 96–112)
Creatinine, Ser: 1.14 mg/dL (ref 0.50–1.35)
GFR calc Af Amer: 76 mL/min — ABNORMAL LOW
GFR calc non Af Amer: 65 mL/min — ABNORMAL LOW
Glucose, Bld: 117 mg/dL — ABNORMAL HIGH (ref 70–99)
Potassium: 3.4 meq/L — ABNORMAL LOW (ref 3.5–5.1)
Sodium: 136 meq/L (ref 135–145)

## 2012-12-15 LAB — GLUCOSE, CAPILLARY: Glucose-Capillary: 117 mg/dL — ABNORMAL HIGH (ref 70–99)

## 2012-12-15 MED ORDER — POTASSIUM CHLORIDE 10 MEQ/50ML IV SOLN
10.0000 meq | INTRAVENOUS | Status: AC
  Administered 2012-12-15 (×4): 10 meq via INTRAVENOUS
  Filled 2012-12-15: qty 50

## 2012-12-15 MED ORDER — TRIAMTERENE-HCTZ 37.5-25 MG PO TABS
1.0000 | ORAL_TABLET | Freq: Every day | ORAL | Status: DC
  Administered 2012-12-16 – 2012-12-21 (×6): 1 via ORAL
  Filled 2012-12-15 (×7): qty 1

## 2012-12-15 MED ORDER — OMEGA-3 FATTY ACIDS 1000 MG PO CAPS: 1.0000 g | ORAL_CAPSULE | Freq: Every day | ORAL | Status: DC

## 2012-12-15 MED ORDER — POTASSIUM CHLORIDE CRYS ER 20 MEQ PO TBCR
20.0000 meq | EXTENDED_RELEASE_TABLET | Freq: Two times a day (BID) | ORAL | Status: DC
  Administered 2012-12-15 – 2012-12-21 (×12): 20 meq via ORAL
  Filled 2012-12-15 (×13): qty 1

## 2012-12-15 MED ORDER — DILTIAZEM HCL 100 MG IV SOLR
5.0000 mg/h | INTRAVENOUS | Status: DC
  Administered 2012-12-15: 5 mg/h via INTRAVENOUS
  Administered 2012-12-17: 10 mg/h via INTRAVENOUS
  Filled 2012-12-15 (×3): qty 100

## 2012-12-15 MED ORDER — POTASSIUM CHLORIDE 10 MEQ/50ML IV SOLN
INTRAVENOUS | Status: AC
  Administered 2012-12-15: 10 meq
  Filled 2012-12-15: qty 50

## 2012-12-15 MED ORDER — ALUM & MAG HYDROXIDE-SIMETH 200-200-20 MG/5ML PO SUSP: 15.0000 mL | ORAL | Status: DC | PRN

## 2012-12-15 MED ORDER — AMIODARONE HCL IN DEXTROSE 360-4.14 MG/200ML-% IV SOLN
30.0000 mg/h | INTRAVENOUS | Status: DC
  Filled 2012-12-15: qty 200

## 2012-12-15 MED ORDER — FUROSEMIDE 40 MG PO TABS
40.0000 mg | ORAL_TABLET | Freq: Every day | ORAL | Status: DC
  Administered 2012-12-15 – 2012-12-21 (×7): 40 mg via ORAL
  Filled 2012-12-15 (×7): qty 1

## 2012-12-15 MED ORDER — SODIUM CHLORIDE 0.9 % IJ SOLN
3.0000 mL | Freq: Two times a day (BID) | INTRAMUSCULAR | Status: DC
  Administered 2012-12-15 – 2012-12-21 (×7): 3 mL via INTRAVENOUS

## 2012-12-15 MED ORDER — SODIUM CHLORIDE 0.9 % IV SOLN: 250.0000 mL | INTRAVENOUS | Status: DC | PRN

## 2012-12-15 MED ORDER — POTASSIUM CHLORIDE 10 MEQ/50ML IV SOLN
10.0000 meq | INTRAVENOUS | Status: AC
  Administered 2012-12-15 (×2): 10 meq via INTRAVENOUS

## 2012-12-15 MED ORDER — NIACIN 500 MG PO TABS
500.0000 mg | ORAL_TABLET | Freq: Every day | ORAL | Status: DC
  Administered 2012-12-16 – 2012-12-21 (×6): 500 mg via ORAL
  Filled 2012-12-15 (×7): qty 1

## 2012-12-15 MED ORDER — SODIUM CHLORIDE 0.9 % IJ SOLN: 3.0000 mL | INTRAMUSCULAR | Status: DC | PRN

## 2012-12-15 MED ORDER — OMEGA-3-ACID ETHYL ESTERS 1 G PO CAPS
1.0000 g | ORAL_CAPSULE | Freq: Every day | ORAL | Status: DC
  Administered 2012-12-15 – 2012-12-21 (×7): 1 g via ORAL
  Filled 2012-12-15 (×7): qty 1

## 2012-12-15 MED ORDER — MOVING RIGHT ALONG BOOK
Freq: Once | Status: AC
  Administered 2012-12-15: 14:00:00
  Filled 2012-12-15: qty 1

## 2012-12-15 MED ORDER — MAGNESIUM HYDROXIDE 400 MG/5ML PO SUSP: 30.0000 mL | Freq: Every day | ORAL | Status: DC | PRN

## 2012-12-15 NOTE — Progress Notes (Addendum)
Pt. Seen and examined. Agree with the NP/PA-C note as written.  Feeling better, now in sinus on IV amiodarone. He has started to have gross motor fasciculations today, mostly of the right leg but some in the left leg, with decreased reflexes. This is new for him, I'm concerned about amiodarone induced neurotoxicity. I would discontinue amiodarone today since he is back in sinus rhythm to see if the symptoms subside.  May need neurology consult if it persists. Consider outpatient monitor for 30 days post-discharge to assess for a-fib burden.    Chrystie Nose, MD, [facility] Attending Cardiologist [facility]

## 2012-12-15 NOTE — Progress Notes (Signed)
3 Days Post-Op Procedure(s) (LRB):  CORONARY ARTERY BYPASS GRAFTING   Subjective: No specific complaints, tired, up in chair  Objective: Vital signs in last 24 hours: Temp:  [97.5 F (36.4 C)-98.8 F (37.1 C)] 98.4 F (36.9 C) (12/20 0735) Pulse Rate:  [45-113] 64  (12/20 0900) Resp:  [9-30] 10  (12/20 0900) BP: (96-121)/(62-98) 119/71 mmHg (12/20 0900) SpO2:  [91 %-98 %] 96 % (12/20 0900) Weight:  [99.2 kg (218 lb 11.1 oz)] 99.2 kg (218 lb 11.1 oz) (12/20 0600) Weight change: 1.3 kg (2 lb 13.9 oz)   Intake/Output from previous day: -258 12/19 0701 - 12/20 0700 In: 1168.1 [P.O.:180; I.V.:938.1; IV Piggyback:50] Out: 1385 [Urine:1385] Intake/Output this shift: Total I/O In: 120 [P.O.:120] Out: -   PE: General:alert and oriented, MAE Heart:S1S2 RRR Lungs:clear ant. No wheezes Abd:+ BS, soft, non tender Ext:no edema   Lab Results:  Basename 12/14/12 0355 12/13/12 1620  WBC 10.0 10.6*  HGB 10.5* 11.2*  HCT 31.2* 33.1*  PLT 130* 138*   BMET  Basename 12/15/12 0450 12/14/12 0355  NA 136 135  K 3.4* 3.8  CL 100 100  CO2 29 27  GLUCOSE 117* 125*  BUN 22 17  CREATININE 1.14 1.23  CALCIUM 8.8 8.6   No results found for this basename: TROPONINI:2,CK,MB:2 in the last 72 hours  Lab Results  Component Value Date   CHOL 172 10/26/2010   HDL 38.10* 10/26/2010   LDLDIRECT 83.9 10/26/2010   TRIG 329.0* 10/26/2010   CHOLHDL 5 10/26/2010   Lab Results  Component Value Date   HGBA1C 5.6 12/08/2012     Lab Results  Component Value Date   TSH 2.14 10/26/2010     Studies/Results: Dg Chest Portable 1 View In Am  12/14/2012  *RADIOLOGY REPORT*  Clinical Data: Postop CABG, follow  PORTABLE CHEST - 1 VIEW  Comparison: Portable chest x-ray of 12/13/2012  Findings: Aeration of the lungs has improved slightly.  The left chest tube has been removed and no pneumothorax is seen.  Left basilar opacity remains and cardiomegaly is stable.  The Swan-Ganz catheter has been  removed and a venous sheath remains in the SVC.  IMPRESSION: Minimally improved aeration.  Left chest tube removed.  No pneumothorax.  Swan-Ganz catheter removed as well.   Original Report Authenticated By: Dwyane Dee, M.D.     Medications: I have reviewed the patient's current medications.    Marland Kitchen acetaminophen  1,000 mg Oral Q6H   Or  . acetaminophen (TYLENOL) oral liquid 160 mg/5 mL  975 mg Per Tube Q6H  . allopurinol  300 mg Oral Daily  . aspirin EC  325 mg Oral Daily   Or  . aspirin  324 mg Per Tube Daily  . atorvastatin  20 mg Oral q1800  . bisacodyl  10 mg Oral Daily   Or  . bisacodyl  10 mg Rectal Daily  . docusate sodium  200 mg Oral Daily  . enoxaparin (LOVENOX) injection  40 mg Subcutaneous Q24H  . metoprolol tartrate  25 mg Oral BID  . mupirocin ointment   Nasal BID  . pantoprazole  40 mg Oral Daily  . potassium chloride  10 mEq Intravenous Q1 Hr x 2  . potassium chloride      . potassium chloride  20 mEq Oral BID  . 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  Had a fib yesterday but has converted to SR with IV amiodarone which continues, on lopressor 25 BID also. When converted had 3.1 sec pause. Wt is up to 99 kg from 97 on this admit. To transfer to 2000.  LOS: 3 days   Madie Cahn R 12/15/2012, 10:15 AM

## 2012-12-15 NOTE — Progress Notes (Signed)
3 Days Post-Op Procedure(s) (LRB): CORONARY ARTERY BYPASS GRAFTING (CABG) (N/A) Subjective: Feels better this AM  Objective: Vital signs in last 24 hours: Temp:  [97.5 F (36.4 C)-98.8 F (37.1 C)] 98.4 F (36.9 C) (12/20 0735) Pulse Rate:  [45-113] 63  (12/20 0700) Cardiac Rhythm:  [-] Normal sinus rhythm (12/20 0600) Resp:  [9-30] 10  (12/20 0700) BP: (95-121)/(62-98) 110/67 mmHg (12/20 0700) SpO2:  [91 %-98 %] 95 % (12/20 0700) Weight:  [218 lb 11.1 oz (99.2 kg)] 218 lb 11.1 oz (99.2 kg) (12/20 0600)  Hemodynamic parameters for last 24 hours:    Intake/Output from previous day: 12/19 0701 - 12/20 0700 In: 1168.1 [P.O.:180; I.V.:938.1; IV Piggyback:50] Out: 1385 [Urine:1385] Intake/Output this shift:    General appearance: alert and no distress Neurologic: intact Heart: regular rate and rhythm Lungs: diminished breath sounds bibasilar Abdomen: normal findings: soft, non-tender  Lab Results:  Basename 12/14/12 0355 12/13/12 1620  WBC 10.0 10.6*  HGB 10.5* 11.2*  HCT 31.2* 33.1*  PLT 130* 138*   BMET:  Basename 12/15/12 0450 12/14/12 0355  NA 136 135  K 3.4* 3.8  CL 100 100  CO2 29 27  GLUCOSE 117* 125*  BUN 22 17  CREATININE 1.14 1.23  CALCIUM 8.8 8.6    PT/INR:  Basename 12/12/12 1825  LABPROT 17.9*  INR 1.52*   ABG    Component Value Date/Time   PHART 7.389 12/13/2012 0648   HCO3 25.3* 12/13/2012 0648   TCO2 25 12/13/2012 1619   ACIDBASEDEF 2.0 12/13/2012 0515   O2SAT 98.0 12/13/2012 0648   CBG (last 3)   Basename 12/15/12 0729 12/14/12 2203 12/14/12 1725  GLUCAP 117* 114* 113*    Assessment/Plan: S/P Procedure(s) (LRB): CORONARY ARTERY BYPASS GRAFTING (CABG) (N/A) Plan for transfer to step-down: see transfer orders POD # 3 CABG x 4  CV- converted to SR- will keep on IV amiodarone today, convert to PO tomorrow  RESP- pulmonary toilet for bibasilar atelectasis  RENAL- volume overloaded- diurese, hypokalemia- supplement  CBG well  controlled - d/c CBG/ SSI  AMBULATE   LOS: 3 days    Hunter [name] C 12/15/2012

## 2012-12-15 NOTE — Progress Notes (Signed)
Pt tx to Unit 2000 after report called to RN. Pt ambulated 300 ft before transfer w/ VSS, tolerated well. Pt recvd by RN and NT.

## 2012-12-16 ENCOUNTER — Inpatient Hospital Stay (HOSPITAL_COMMUNITY): Payer: Medicare Other

## 2012-12-16 DIAGNOSIS — R931 Abnormal findings on diagnostic imaging of heart and coronary circulation: Secondary | ICD-10-CM | POA: Diagnosis present

## 2012-12-16 DIAGNOSIS — A809 Acute poliomyelitis, unspecified: Secondary | ICD-10-CM | POA: Diagnosis present

## 2012-12-16 LAB — BASIC METABOLIC PANEL
BUN: 20 mg/dL (ref 6–23)
CO2: 27 mEq/L (ref 19–32)
Calcium: 8.5 mg/dL (ref 8.4–10.5)
Chloride: 102 mEq/L (ref 96–112)
Creatinine, Ser: 1.11 mg/dL (ref 0.50–1.35)
GFR calc Af Amer: 78 mL/min — ABNORMAL LOW (ref >90)
GFR calc non Af Amer: 67 mL/min — ABNORMAL LOW (ref >90)
Glucose, Bld: 95 mg/dL (ref 70–99)
Potassium: 3.4 mEq/L — ABNORMAL LOW (ref 3.5–5.1)
Sodium: 140 mEq/L (ref 135–145)

## 2012-12-16 LAB — CBC
HCT: 30.3 % — ABNORMAL LOW (ref 39.0–52.0)
Hemoglobin: 10.4 g/dL — ABNORMAL LOW (ref 13.0–17.0)
MCH: 30.4 pg (ref 26.0–34.0)
MCHC: 34.3 g/dL (ref 30.0–36.0)
MCV: 88.6 fL (ref 78.0–100.0)
Platelets: 173 10*3/uL (ref 150–400)
RBC: 3.42 MIL/uL — ABNORMAL LOW (ref 4.22–5.81)
RDW: 14.2 % (ref 11.5–15.5)
WBC: 4.7 10*3/uL (ref 4.0–10.5)

## 2012-12-16 MED ORDER — POTASSIUM CHLORIDE CRYS ER 10 MEQ PO TBCR
30.0000 meq | EXTENDED_RELEASE_TABLET | Freq: Once | ORAL | Status: AC
  Administered 2012-12-16: 30 meq via ORAL
  Filled 2012-12-16: qty 1

## 2012-12-16 MED ORDER — DIGOXIN 0.25 MG/ML IJ SOLN
0.1250 mg | Freq: Four times a day (QID) | INTRAMUSCULAR | Status: AC
  Administered 2012-12-16 (×2): 0.125 mg via INTRAVENOUS
  Filled 2012-12-16 (×5): qty 0.5

## 2012-12-16 MED ORDER — DIGOXIN 125 MCG PO TABS
0.1250 mg | ORAL_TABLET | Freq: Every day | ORAL | Status: DC
  Administered 2012-12-17 – 2012-12-21 (×5): 0.125 mg via ORAL
  Filled 2012-12-16 (×5): qty 1

## 2012-12-16 MED ORDER — POTASSIUM CHLORIDE CRYS ER 20 MEQ PO TBCR: 20.0000 meq | EXTENDED_RELEASE_TABLET | Freq: Once | ORAL | Status: DC

## 2012-12-16 MED ORDER — POTASSIUM CHLORIDE CRYS ER 20 MEQ PO TBCR
20.0000 meq | EXTENDED_RELEASE_TABLET | Freq: Once | ORAL | Status: AC
  Administered 2012-12-16: 20 meq via ORAL

## 2012-12-16 NOTE — Progress Notes (Signed)
Subjective:  Tired, up for CXR 4am  Objective:  Vital Signs in the last 24 hours: Temp:  [97.2 F (36.2 C)-98.1 F (36.7 C)] 98.1 F (36.7 C) (12/21 0520) Pulse Rate:  [64-140] 69  (12/21 0520) Resp:  [10-18] 18  (12/21 0520) BP: (102-134)/(63-88) 102/72 mmHg (12/21 0520) SpO2:  [94 %-97 %] 97 % (12/21 0520) FiO2 (%):  [21 %] 21 % (12/20 1347) Weight:  [97.5 kg (214 lb 15.2 oz)] 97.5 kg (214 lb 15.2 oz) (12/21 0520)  Intake/Output from previous day:  Intake/Output Summary (Last 24 hours) at 12/16/12 0830 Last data filed at 12/16/12 0529  Gross per 24 hour  Intake  456.8 ml  Output    825 ml  Net -368.2 ml    Physical Exam: General appearance: no distress and sleeping Lungs: decreased breath sounds Heart: irregularly irregular rhythm   Rate: 148  Rhythm: atrial fibrillation  Lab Results:  Basename 12/16/12 0620 12/14/12 0355  WBC 4.7 10.0  HGB 10.4* 10.5*  PLT 173 130*    Basename 12/16/12 0620 12/15/12 0450  NA 140 136  K 3.4* 3.4*  CL 102 100  CO2 27 29  GLUCOSE 95 117*  BUN 20 22  CREATININE 1.11 1.14   No results found for this basename: TROPONINI:2,CK,MB:2 in the last 72 hours Hepatic Function Panel No results found for this basename: PROT,ALBUMIN,AST,ALT,ALKPHOS,BILITOT,BILIDIR,IBILI in the last 72 hours No results found for this basename: CHOL in the last 72 hours No results found for this basename: INR in the last 72 hours  Imaging: Imaging results have been reviewed  Cardiac Studies:  Assessment/Plan:   Principal Problem:  *S/P CABG x 4, 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 He is on Lopressor and IV Diltiazem. Amiodarone stopped secondary to possible Neurologic side effects, (see  Dr Blanchie Dessert note 12/20). Will discuss with Dr Allyson Sabal this am.  Corine Shelter PA-C 12/16/2012, 8:30 AM    Agree with note written by Corine Shelter [facility]  POD # 4 CABG. PAF with RVR. On dilt drip and BB, Lovenox. Exam benign. Labs OK. Given soft BP recommend digitalization. May want to add Multaq. Will prob need oral anticoag as well (Xarelto).  Runell Gess 12/16/2012 8:51 AM

## 2012-12-16 NOTE — Progress Notes (Addendum)
                   [address]            [place] [address]          [phone]      4 Days Post-Op Procedure(s) (LRB): CORONARY ARTERY BYPASS GRAFTING (CABG) (N/A)  Subjective: Patient with occasional incisional pain.  Objective: Vital signs in last 24 hours: Temp:  [97.2 F (36.2 C)-98.1 F (36.7 C)] 98.1 F (36.7 C) (12/21 0520) Pulse Rate:  [64-140] 69  (12/21 0520) Cardiac Rhythm:  [-] Atrial fibrillation (12/20 2100) Resp:  [10-18] 18  (12/21 0520) BP: (102-134)/(63-88) 102/72 mmHg (12/21 0520) SpO2:  [94 %-97 %] 97 % (12/21 0520) FiO2 (%):  [21 %] 21 % (12/20 1347) Weight:  [97.5 kg (214 lb 15.2 oz)] 97.5 kg (214 lb 15.2 oz) (12/21 0520)  Pre op weight  94 kg Current Weight  12/16/12 97.5 kg (214 lb 15.2 oz)      Intake/Output from previous day: 12/20 0701 - 12/21 0700 In: 576.8 [P.O.:360; I.V.:116.8] Out: 825 [Urine:825]   Physical Exam:  Cardiovascular: IRRR IRRR Pulmonary: Mildly diminished at bases; no rales, wheezes, or rhonchi. Abdomen: Soft, non tender, bowel sounds present. Extremities: Mild bilateral lower extremity edema. Wounds: Clean and dry.  No erythema or signs of infection.  Lab Results: CBC: Basename 12/16/12 0620 12/14/12 0355  WBC 4.7 10.0  HGB 10.4* 10.5*  HCT 30.3* 31.2*  PLT 173 130*   BMET:  Basename 12/16/12 0620 12/15/12 0450  NA 140 136  K 3.4* 3.4*  CL 102 100  CO2 27 29  GLUCOSE 95 117*  BUN 20 22  CREATININE 1.11 1.14  CALCIUM 8.5 8.8    PT/INR:  Lab Results  Component Value Date   INR 1.52* 12/12/2012   INR 0.96 12/08/2012   ABG:  INR: Will add last result for INR, ABG once components are confirmed Will add last 4 CBG results once components are confirmed  Assessment/Plan:  1. CV - Afib with RVR. On Cardizem gttp and Lopressor 25 bid. As discussed with cardiology, will give Digoxin 0.125 mg IV  x 3 doses then will start po tomorrow. May need Xarelto or Coumadin as well.NO Amiodarone  as had gross motor fasciculations yesterday (? Neurotoxicity) 2.  Pulmonary - Encourage incentive spirometer. CXR this am shows no pneumothorax, small bilateral pleural effusions. 3. Volume Overload - Continue with diuresis 4.  Acute blood loss anemia - H and H stable at 10.4 and 30.3. 5.Supplement potassium 6.Thrombocytopenia resolved-platelets up to 130,000  ZIMMERMAN,DONIELLE MPA-C 12/16/2012,8:29 AM   ok to start Xarelto if afib persists patient examined and medical record reviewed,agree with above note. VAN TRIGT III,Raveena Hebdon 12/16/2012

## 2012-12-17 LAB — BASIC METABOLIC PANEL
BUN: 23 mg/dL (ref 6–23)
CO2: 28 mEq/L (ref 19–32)
Calcium: 8.9 mg/dL (ref 8.4–10.5)
Chloride: 98 mEq/L (ref 96–112)
Creatinine, Ser: 1.22 mg/dL (ref 0.50–1.35)
GFR calc Af Amer: 70 mL/min — ABNORMAL LOW (ref >90)
GFR calc non Af Amer: 60 mL/min — ABNORMAL LOW (ref >90)
Glucose, Bld: 95 mg/dL (ref 70–99)
Potassium: 3.8 mEq/L (ref 3.5–5.1)
Sodium: 136 mEq/L (ref 135–145)

## 2012-12-17 MED ORDER — DILTIAZEM HCL ER COATED BEADS 120 MG PO CP24
120.0000 mg | ORAL_CAPSULE | Freq: Every day | ORAL | Status: DC
  Administered 2012-12-17: 120 mg via ORAL
  Filled 2012-12-17 (×2): qty 1

## 2012-12-17 MED ORDER — DILTIAZEM HCL 100 MG IV SOLR
5.0000 mg/h | INTRAVENOUS | Status: DC
  Administered 2012-12-18 (×2): 5 mg/h via INTRAVENOUS
  Administered 2012-12-18: 10 mg/h via INTRAVENOUS
  Administered 2012-12-19: 15 mg/h via INTRAVENOUS
  Filled 2012-12-17 (×5): qty 100

## 2012-12-17 MED ORDER — DILTIAZEM HCL 100 MG IV SOLR
5.0000 mg/h | INTRAVENOUS | Status: DC
  Administered 2012-12-17: 10 mg/h via INTRAVENOUS
  Administered 2012-12-17 (×2): 5 mg/h via INTRAVENOUS
  Filled 2012-12-17: qty 100

## 2012-12-17 MED ORDER — POTASSIUM CHLORIDE CRYS ER 20 MEQ PO TBCR
20.0000 meq | EXTENDED_RELEASE_TABLET | Freq: Once | ORAL | Status: AC
  Administered 2012-12-17: 20 meq via ORAL

## 2012-12-17 NOTE — Progress Notes (Addendum)
                   [address]            [place] [address]          [phone]      5 Days Post-Op Procedure(s) (LRB): CORONARY ARTERY BYPASS GRAFTING (CABG) (N/A)  Subjective: Patient sleepy this am. No real complaints.  Objective: Vital signs in last 24 hours: Temp:  [98.3 F (36.8 C)-98.5 F (36.9 C)] 98.3 F (36.8 C) (12/22 0536) Pulse Rate:  [76-82] 78  (12/22 0536) Cardiac Rhythm:  [-] Normal sinus rhythm (12/22 0807) Resp:  [18-20] 20  (12/22 0536) BP: (102-110)/(65-76) 106/65 mmHg (12/22 0536) SpO2:  [95 %-97 %] 95 % (12/22 0536) Weight:  [95.437 kg (210 lb 6.4 oz)] 95.437 kg (210 lb 6.4 oz) (12/22 0536)  Pre op weight  94 kg Current Weight  12/17/12 95.437 kg (210 lb 6.4 oz)      Intake/Output from previous day: 12/21 0701 - 12/22 0700 In: 480 [P.O.:480] Out: 801 [Urine:800; Stool:1]   Physical Exam:  Cardiovascular: RRR Pulmonary: Mildly diminished at bases; no rales, wheezes, or rhonchi. Abdomen: Soft, non tender, bowel sounds present. Extremities: Mild bilateral lower extremity edema. Wounds: Clean and dry.  No erythema or signs of infection.  Lab Results: CBC:  Basename 12/16/12 0620  WBC 4.7  HGB 10.4*  HCT 30.3*  PLT 173   BMET:   Basename 12/17/12 0520 12/16/12 0620  NA 136 140  K 3.8 3.4*  CL 98 102  CO2 28 27  GLUCOSE 95 95  BUN 23 20  CREATININE 1.22 1.11  CALCIUM 8.9 8.5    PT/INR:  Lab Results  Component Value Date   INR 1.52* 12/12/2012   INR 0.96 12/08/2012   ABG:  INR: Will add last result for INR, ABG once components are confirmed Will add last 4 CBG results once components are confirmed  Assessment/Plan:  1. CV - Afib with RVR. On Cardizem gttp and Lopressor 25 bid. Given Digoxin IV yesterday. Oral Digoxin to start today.NO Amiodarone as had gross motor fasciculations yesterday (? Neurotoxicity). He has PAF-in SR at time of exam.May need Xarelto. Will stop Cardizem gttp. SBP in low 100's.  Will discuss with cardiology as to continue Digoxin orally or start Cardizem orally today. 2.  Pulmonary - Encourage incentive spirometer. CXR this am shows no pneumothorax, small bilateral pleural effusions. 3. Volume Overload - Continue with diuresis 4.  Acute blood loss anemia - H and H stable at 10.4 and 30.3. 5.Supplement potassium 6.Thrombocytopenia resolved-platelets up to 130,000  ZIMMERMAN,DONIELLE MPA-C 12/17/2012,8:25 AM     The patient maintains sinus rhythm on IV Cardizem and but then converted back to atrial fibrillation when the drip was discontinued We'll start by mouth Cardizem SR and try to wean off drip later today If by mouth Cardizem, metoprolol, digoxin results in too slow a heart rate will readjust medication and can utilize temporary wires if needed

## 2012-12-17 NOTE — Progress Notes (Signed)
Subjective:  No complaints of CP/SOB   Objective:  Temp:  [98.3 F (36.8 C)-98.5 F (36.9 C)] 98.3 F (36.8 C) (12/22 0536) Pulse Rate:  [76-82] 78  (12/22 0536) Resp:  [18-20] 20  (12/22 0536) BP: (102-110)/(65-76) 106/65 mmHg (12/22 0536) SpO2:  [95 %-97 %] 95 % (12/22 0536) Weight:  [95.437 kg (210 lb 6.4 oz)] 95.437 kg (210 lb 6.4 oz) (12/22 0536) Weight change: -2.063 kg (-4 lb 8.8 oz)  Intake/Output from previous day: 12/21 0701 - 12/22 0700 In: 480 [P.O.:480] Out: 801 [Urine:800; Stool:1]  Intake/Output from this shift:    Physical Exam: General appearance: alert, cooperative and no distress Neck: no adenopathy, no carotid bruit, no JVD, supple, symmetrical, trachea midline and thyroid not enlarged, symmetric, no tenderness/mass/nodules Lungs: clear to auscultation bilaterally Heart: regular rate and rhythm, S1, S2 normal, no murmur, click, rub or gallop Extremities: extremities normal, atraumatic, no cyanosis or edema  Lab Results: Results for orders placed during the hospital encounter of 12/12/12 (from the past 48 hour(s))  CBC     Status: Abnormal   Collection Time   12/16/12  6:20 AM      Component Value Range Comment   WBC 4.7  4.0 - 10.5 K/uL    RBC 3.42 (*) 4.22 - 5.81 MIL/uL    Hemoglobin 10.4 (*) 13.0 - 17.0 g/dL    HCT 47.8 (*) 29.5 - 52.0 %    MCV 88.6  78.0 - 100.0 fL    MCH 30.4  26.0 - 34.0 pg    MCHC 34.3  30.0 - 36.0 g/dL    RDW 62.1  30.8 - 65.7 %    Platelets 173  150 - 400 K/uL   BASIC METABOLIC PANEL     Status: Abnormal   Collection Time   12/16/12  6:20 AM      Component Value Range Comment   Sodium 140  135 - 145 mEq/L    Potassium 3.4 (*) 3.5 - 5.1 mEq/L    Chloride 102  96 - 112 mEq/L    CO2 27  19 - 32 mEq/L    Glucose, Bld 95  70 - 99 mg/dL    BUN 20  6 - 23 mg/dL    Creatinine, Ser 8.46  0.50 - 1.35 mg/dL    Calcium 8.5  8.4 - 96.2 mg/dL    GFR calc non Af Amer 67 (*) >90 mL/min    GFR calc Af Amer 78 (*) >90 mL/min    BASIC METABOLIC PANEL     Status: Abnormal   Collection Time   12/17/12  5:20 AM      Component Value Range Comment   Sodium 136  135 - 145 mEq/L    Potassium 3.8  3.5 - 5.1 mEq/L    Chloride 98  96 - 112 mEq/L    CO2 28  19 - 32 mEq/L    Glucose, Bld 95  70 - 99 mg/dL    BUN 23  6 - 23 mg/dL    Creatinine, Ser 9.52  0.50 - 1.35 mg/dL    Calcium 8.9  8.4 - 84.1 mg/dL    GFR calc non Af Amer 60 (*) >90 mL/min    GFR calc Af Amer 70 (*) >90 mL/min     Imaging: Imaging results have been reviewed  Assessment/Plan:   1. Principal Problem: 2.  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 Active Problems: 4.  HYPERLIPIDEMIA 5.  HYPERTENSION 6.  Angina effort  7.  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  PAF (paroxysmal atrial fibrillation), post CABG 9.  Abnormal nuclear cardiac imaging test as an OP 10.  [place] 11.   Time Spent Directly with Patient:  20 minutes  Length of Stay:  LOS: 5 days   Converted to NSR. Dilt drip stopped. Ambulating w/o difficulty. Can increase BB to 37.5 mg PO/BID. Will hold off on antiarrythmic or anticoagulation for now. Continue nl progression.    Runell Gess 12/17/2012, 9:23 AM

## 2012-12-18 DIAGNOSIS — I472 Ventricular tachycardia: Secondary | ICD-10-CM | POA: Diagnosis not present

## 2012-12-18 LAB — BASIC METABOLIC PANEL
BUN: 24 mg/dL — ABNORMAL HIGH (ref 6–23)
CO2: 25 mEq/L (ref 19–32)
Calcium: 9 mg/dL (ref 8.4–10.5)
Chloride: 97 mEq/L (ref 96–112)
Creatinine, Ser: 1.29 mg/dL (ref 0.50–1.35)
GFR calc Af Amer: 65 mL/min — ABNORMAL LOW (ref >90)
GFR calc non Af Amer: 56 mL/min — ABNORMAL LOW (ref >90)
Glucose, Bld: 136 mg/dL — ABNORMAL HIGH (ref 70–99)
Potassium: 3.7 mEq/L (ref 3.5–5.1)
Sodium: 135 mEq/L (ref 135–145)

## 2012-12-18 LAB — MAGNESIUM: Magnesium: 2 mg/dL (ref 1.5–2.5)

## 2012-12-18 MED ORDER — ASPIRIN 81 MG PO CHEW: 324.0000 mg | CHEWABLE_TABLET | Freq: Every day | ORAL | Status: DC

## 2012-12-18 MED ORDER — ASPIRIN EC 81 MG PO TBEC
81.0000 mg | DELAYED_RELEASE_TABLET | Freq: Every day | ORAL | Status: DC
Start: 2012-12-19 — End: 2012-12-21
  Administered 2012-12-19 – 2012-12-21 (×3): 81 mg via ORAL
  Filled 2012-12-18 (×3): qty 1

## 2012-12-18 MED ORDER — METOPROLOL TARTRATE 25 MG PO TABS
37.5000 mg | ORAL_TABLET | Freq: Two times a day (BID) | ORAL | Status: DC
  Administered 2012-12-18 – 2012-12-19 (×3): 37.5 mg via ORAL
  Filled 2012-12-18 (×4): qty 1

## 2012-12-18 MED ORDER — RIVAROXABAN 20 MG PO TABS
20.0000 mg | ORAL_TABLET | Freq: Every day | ORAL | Status: DC
  Administered 2012-12-18 – 2012-12-21 (×4): 20 mg via ORAL
  Filled 2012-12-18 (×4): qty 1

## 2012-12-18 MED ORDER — METOPROLOL TARTRATE 25 MG PO TABS
37.5000 mg | ORAL_TABLET | Freq: Two times a day (BID) | ORAL | Status: DC
  Filled 2012-12-18: qty 1

## 2012-12-18 NOTE — Progress Notes (Signed)
Pt. Seen and examined. Agree with the NP/PA-C note as written.  Now back in a-fib/flutter .. HR up to 150's last night with 2:1 conduction, now with variable ventricular response. Possible a-fib with aberrancy or NSVT this am.  No chest pain. I have increased metoprolol to 37.5 mg BID. Will hold po cardizem.  He is on IV cardizem which can be titrated for rate-control.  He is generally unaware of a-fib except for some possible fatigue. Recommend starting xarelto 20 mg daily and hold enoxaparin (last dose yesterday).  If we can control rate, would consider cardioversion electively if it persists in 1 month, however, with better rate control I expect he will spontaneously convert back to sinus.  Chrystie Nose, MD, [facility] Attending Cardiologist [facility]

## 2012-12-18 NOTE — Progress Notes (Signed)
9 beats Vtach noted. Patient sleeping. Denies chest pain or shortness of breath. Cardiezm gtt noted @ 5/hr. Cardiology PA [facility] notified; new orders received. Call bell and family near. Mamie Levers

## 2012-12-18 NOTE — Progress Notes (Signed)
CARDIAC REHAB PHASE I   PRE:  Rate/Rhythm: 120-150 Afib  BP:  Supine: 110/64  Sitting:   Standing:   SaO2: 95 RA  MODE:  Ambulation: 350 ft   POST:  Rate/Rhythem: 130-162nAFIB  BP:  Supine:   Sitting: 110/60  Standing:    SaO2: 97 RA 1445-1530  On arrival pt sleeping HR 120's to 150's. Discussed with RN to walk with pt. Assisted X 1 and used walker to ambulate. Gait steady with walker. HR during walk 115- 163 Afib. Pt not symptomatic with increased  HR. Pt to recliner after walk with call light in reach. Reported to RN after walk.  Beatrix Fetters

## 2012-12-18 NOTE — Progress Notes (Signed)
Patient ambulated 300 ft in hallway while pushing wheelchair. No difficulties ambulating noted. HR 140s while ambulating. Returned to room w/out incidence.  Call bell and family near. Hunter [name]

## 2012-12-18 NOTE — Progress Notes (Addendum)
[address]            [place] [address]          [phone]     6 Days Post-Op  Procedure(s) (LRB): CORONARY ARTERY BYPASS GRAFTING (CABG) (N/A) Subjective: Feels well for the most part  Objective  Telemetry afib with RVR at times  Temp:  [98 F (36.7 C)-99.5 F (37.5 C)] 98.1 F (36.7 C) (12/23 0509) Pulse Rate:  [76-104] 98  (12/23 0509) Resp:  [18-20] 18  (12/23 0509) BP: (100-130)/(66-81) 115/71 mmHg (12/23 0509) SpO2:  [93 %-97 %] 94 % (12/23 0509) Weight:  [206 lb 12.7 oz (93.8 kg)] 206 lb 12.7 oz (93.8 kg) (12/23 0509)   Intake/Output Summary (Last 24 hours) at 12/18/12 0813 Last data filed at 12/18/12 0600  Gross per 24 hour  Intake    150 ml  Output   1480 ml  Net  -1330 ml       General appearance: alert, cooperative and no distress Heart: irregularly irregular rhythm Lungs: dim bil lower fields Abdomen: soft, nontender Extremities: some LE edema R> L Wound: incisions healing well  Lab Results:  Basename 12/17/12 0520 12/16/12 0620  NA 136 140  K 3.8 3.4*  CL 98 102  CO2 28 27  GLUCOSE 95 95  BUN 23 20  CREATININE 1.22 1.11  CALCIUM 8.9 8.5  MG -- --  PHOS -- --   No results found for this basename: AST:2,ALT:2,ALKPHOS:2,BILITOT:2,PROT:2,ALBUMIN:2 in the last 72 hours No results found for this basename: LIPASE:2,AMYLASE:2 in the last 72 hours  Basename 12/16/12 0620  WBC 4.7  NEUTROABS --  HGB 10.4*  HCT 30.3*  MCV 88.6  PLT 173   No results found for this basename: CKTOTAL:4,CKMB:4,TROPONINI:4 in the last 72 hours No components found with this basename: POCBNP:3 No results found for this basename: DDIMER in the last 72 hours No results found for this basename: HGBA1C in the last 72 hours No results found for this basename: CHOL,HDL,LDLCALC,TRIG,CHOLHDL in the last 72 hours No results found for this basename: TSH,T4TOTAL,FREET3,T3FREE,THYROIDAB in the last 72 hours No results found for this  basename: VITAMINB12,FOLATE,FERRITIN,TIBC,IRON,RETICCTPCT in the last 72 hours  Medications: Scheduled    . allopurinol  300 mg Oral Daily  . aspirin EC  325 mg Oral Daily   Or  . aspirin  324 mg Per Tube Daily  . atorvastatin  20 mg Oral q1800  . bisacodyl  10 mg Oral Daily  . digoxin  0.125 mg Oral Daily  . diltiazem  120 mg Oral Daily  . docusate sodium  200 mg Oral Daily  . enoxaparin (LOVENOX) injection  40 mg Subcutaneous Q24H  . furosemide  40 mg Oral Daily  . metoprolol tartrate  25 mg Oral BID  . mupirocin ointment   Nasal BID  . niacin  500 mg Oral Q breakfast  . omega-3 acid ethyl esters  1 g Oral Daily  . pantoprazole  40 mg Oral Daily  . potassium chloride  20 mEq Oral BID  . sodium chloride  3 mL Intravenous Q12H  . triamterene-hydrochlorothiazide  1 each Oral Daily     Radiology/Studies:  No results found.  INR: Will add last result for INR, ABG once components are confirmed Will add last 4 CBG results once components are confirmed  Assessment/Plan: S/P Procedure(s) (LRB): CORONARY ARTERY BYPASS GRAFTING (CABG) (N/A)  1. afib- conts current rx for now 2 cont pulm toilet/rehab 3 gentle diuresis   LOS: 6 days    GOLD,WAYNE E 12/23/20138:13 AM     Chart reviewed, patient examined, agree with above. Still having A-fib with RVR to 160's this am. Lopressor increased and oral cardizem stopped. Still on cardizem drip at 10 mg/hr , Digoxin 0.125. Allergic to amio. Check digoxin level in am. May need to increase this to help with rate control.

## 2012-12-18 NOTE — Progress Notes (Signed)
Subjective:  Up ambulating this am.  Objective:  Vital Signs in the last 24 hours: Temp:  [98 F (36.7 C)-99.5 F (37.5 C)] 98.1 F (36.7 C) (12/23 0509) Pulse Rate:  [76-104] 98  (12/23 0509) Resp:  [18-20] 18  (12/23 0509) BP: (100-130)/(66-81) 115/71 mmHg (12/23 0509) SpO2:  [93 %-97 %] 94 % (12/23 0509) Weight:  [93.8 kg (206 lb 12.7 oz)] 93.8 kg (206 lb 12.7 oz) (12/23 0509)  Intake/Output from previous day:  Intake/Output Summary (Last 24 hours) at 12/18/12 8119 Last data filed at 12/18/12 0600  Gross per 24 hour  Intake    150 ml  Output   1480 ml  Net  -1330 ml    Physical Exam: General appearance: alert, cooperative and no distress Lungs: clear to auscultation bilaterally Heart: irregularly irregular rhythm   Rate: 110  Rhythm: atrial fibrillation- pt went back into AF with RVR yesterday when IV Diltiazem stopped. This am he had 9 beats of WCT (looks to be NSVT)  Lab Results:  [facility] 12/16/12 0620  WBC 4.7  HGB 10.4*  PLT 173    Basename 12/17/12 0520 12/16/12 0620  NA 136 140  K 3.8 3.4*  CL 98 102  CO2 28 27  GLUCOSE 95 95  BUN 23 20  CREATININE 1.22 1.11   No results found for this basename: TROPONINI:2,CK,MB:2 in the last 72 hours Hepatic Function Panel No results found for this basename: PROT,ALBUMIN,AST,ALT,ALKPHOS,BILITOT,BILIDIR,IBILI in the last 72 hours No results found for this basename: CHOL in the last 72 hours No results found for this basename: INR in the last 72 hours  Imaging: Imaging results have been reviewed  Cardiac Studies:  Assessment/Plan:   Principal Problem:  *S/P CABG x 4, 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 Pre  op LVF NL. Run of WCT this am looks like NSVT- (? AF with abearance ). He is on IV Dilt at 5mg  /hr, po Dilt 120, Dig 0.125, and lopressor 25mg  BID. He may need anticoagulation, ? Amio. Will discuss with Dr Rennis Golden. Check K+ and Mg++    Corine Shelter PA-C 12/18/2012, 9:38 AM

## 2012-12-19 LAB — DIGOXIN LEVEL: Digoxin Level: 0.8 ng/mL (ref 0.8–2.0)

## 2012-12-19 MED ORDER — FUROSEMIDE 40 MG PO TABS: 40.0000 mg | ORAL_TABLET | Freq: Every day | ORAL | Status: DC

## 2012-12-19 MED ORDER — DILTIAZEM HCL 60 MG PO TABS
60.0000 mg | ORAL_TABLET | Freq: Four times a day (QID) | ORAL | Status: DC
  Administered 2012-12-19: 60 mg via ORAL
  Filled 2012-12-19 (×4): qty 1

## 2012-12-19 MED ORDER — METOPROLOL TARTRATE 25 MG PO TABS
25.0000 mg | ORAL_TABLET | Freq: Two times a day (BID) | ORAL | Status: DC
  Administered 2012-12-19 – 2012-12-21 (×4): 25 mg via ORAL
  Filled 2012-12-19 (×5): qty 1

## 2012-12-19 MED ORDER — TRAMADOL HCL 50 MG PO TABS: 50.0000 mg | ORAL_TABLET | Freq: Four times a day (QID) | ORAL | Status: DC | PRN

## 2012-12-19 MED ORDER — POTASSIUM CHLORIDE CRYS ER 20 MEQ PO TBCR: 20.0000 meq | EXTENDED_RELEASE_TABLET | Freq: Two times a day (BID) | ORAL | Status: DC

## 2012-12-19 MED ORDER — SOTALOL HCL 80 MG PO TABS
80.0000 mg | ORAL_TABLET | Freq: Two times a day (BID) | ORAL | Status: DC
  Administered 2012-12-19 – 2012-12-21 (×4): 80 mg via ORAL
  Filled 2012-12-19 (×6): qty 1

## 2012-12-19 MED ORDER — DIGOXIN 125 MCG PO TABS: 0.1250 mg | ORAL_TABLET | Freq: Every day | ORAL | Status: DC

## 2012-12-19 MED ORDER — DILTIAZEM HCL 60 MG PO TABS: 60.0000 mg | ORAL_TABLET | Freq: Four times a day (QID) | ORAL | Status: DC

## 2012-12-19 MED ORDER — RIVAROXABAN 20 MG PO TABS: 20.0000 mg | ORAL_TABLET | Freq: Every day | ORAL | Status: DC

## 2012-12-19 MED ORDER — METOPROLOL TARTRATE 25 MG PO TABS: 37.5000 mg | ORAL_TABLET | Freq: Two times a day (BID) | ORAL | Status: DC

## 2012-12-19 NOTE — Discharge Summary (Signed)
Physician Discharge Summary  Patient ID: Hunter [name] MRN: [id number] DOB/AGE: Sep 21, 1946 66 y.o.  Admit date: 12/12/2012 Discharge date: 12/19/2012  Admission Diagnoses:  Patient Active Problem List  Diagnosis  . HYPERLIPIDEMIA  . GOUT  . MONONEURITIS OF UNSPECIFIED SITE  . HYPERTENSION  . GERD  . NEPHROLITHIASIS, HX OF  . Angina effort  . 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  Abnormal nuclear cardiac imaging test as an OP  . Polio  . NSVT (nonsustained ventricular tachycardia), 9 beats 12/23   Discharge Diagnoses:   Patient Active Problem List  Diagnosis  . HYPERLIPIDEMIA  . GOUT  . MONONEURITIS OF UNSPECIFIED SITE  . HYPERTENSION  . GERD  . NEPHROLITHIASIS, HX OF  . Angina effort  . 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 PAF (paroxysmal atrial fibrillation), post CABG  . Abnormal nuclear cardiac imaging test as an OP  . Polio  . NSVT (nonsustained ventricular tachycardia), 9 beats 12/23   Discharged Condition: good  History of Present Illness:   Hunter [name] is a 66 yo white male with known history of Polio, Hyperlipidemia, and FH of CAD.  He presented to PCP with a complaint of epigastric and upper abdominal pain which was typically associated with exertion.  He underwent further workup which consisted of a Nuclear treadmill test which was positive reversible ischemia in the inferior and anteroapical territories.  His EF was preserved at 51%  He was subsequently referred to Cardiology for further workup.  He underwent cardiac catheterization on 12/08/2012 which showed multivessel CAD and a preserved EF.  It was felt Coronary Bypass would best serve this patient and he was referred to [facility] for evaluation.  Dr. Dorris Fetch evaluated the patient and agreed that he would be a good candidate for coronary bypass procedure.  The risks and benefits of the procedure were explained to the patient and he was  agreeable to proceed.  Surgery was tentatively scheduled for 12/12/2012.  Hospital Course:   Hunter [name] presented to [facility] on 12/12/2012.  He was taken to the operating room and underwent CABG x 4 utilizing LIMA to LAD, Sequential SVG to OM1, OM2 and SVG to PDA.  The patient tolerated the procedure well and was taken to the SICU in stable condition.  The patient was extubated the morning after surgery.  He developed Atrial Fibrillation with Rapid Ventricular Response while in the ICU he was started on IV Amiodarone with conversion to NSR.  Patient later developed gross motor fasciculations which were felt to be related to Neurotoxicity due to the Amiodarone and the medication was discontinued.  His chest tubes and arterial lines were removed without difficulty.  He was transferred to the telemetry unit for further convalescence. The patient continued to convert in and out of NSR and Atrial Fibrillation.  Currently the patient is doing well.  He has again converted to NSR.  He will be placed on oral regimen of Cardizem and continue Lopressor, Digoxin, and Xarelto.  If he maintains NSR for 24 hours we will plan to discharge him home tomorrow.  The patient is ambulating without difficulty.  His chest xray does not have significant pleural effusions or evidence of pneumothorax.  He will need to follow up with Dr. Laneta Simmers on 01/09/2013 with a chest xray prior to his appointment.  He is also scheduled to follow up with Dr. Royann Shivers on 01/01/2013.  Significant Diagnostic Studies: cardiac catheterization  Angiographic Findings:  1. The left main coronary artery is free of significant atherosclerosis and bifurcates in the usual fashion into the left anterior descending artery and left circumflex coronary artery.  2. The left anterior descending artery is a large vessel that reaches the apex and generates only one major diagonal branch. There is evidence of an ulcerated plaque with 40% stenosis in  the proximal LAD and there is a 95% stenosis in the mid vessel beyond the diagonal ostium. The remainder of the vessel is relatively free of luminal irregularities and calcification.  3. The left circumflex coronary artery is a large-size vessel non-dominant vessel that generates three major oblique marginal arteries, the proximal one being much smaller. There is evidence of a severe, ulcerated stenosis just beyond the second OM artery takeoff. The left atrial branch and the AV groove vessel provide collaterals to the distal branches of the right coronary artery.  4. The right coronary artery is a relatively small-size, but dominant vessel that has a 95% distal stenosis at the acute margin. There is a small to medium size posterior descending artery and an ostially occluded posterior lateral ventricular artery (which fills during left coronary injections).  5. The left ventricle is normal in size. The left ventricle systolic function is normal with an estimated ejection fraction of 55-60%. Regional wall motion abnormalities are not seen. No left ventricular thrombus is seen. There is no mitral insufficiency. The ascending aorta appears normal. There is no aortic valve stenosis by pullback. The left ventricular end-diastolic pressure is 11 mm Hg.   IMPRESSIONS:  Symptomatic multivessel severe CAD, with preserved LV systolic function.   Treatments: surgery:   Median sternotomy, extracorporeal circulation, coronary  artery bypass grafting x4 (left internal mammary artery to LAD,  sequential saphenous vein graft to obtuse marginals 1 and 2, saphenous  vein graft to posterior descending).  Disposition: 01-Home or Self Care  The patient has been discharged on:   1.Beta Blocker:  Yes [ x  ]                              No   [   ]                              If No, reason:  2.Ace Inhibitor/ARB: Yes [   ]                                     No  [  x  ]                                     If No,  reason: preserved EF, no MI  3.Statin:   Yes [  x ]                  No  [   ]                  If No, reason:  4.Ecasa:  Yes  [x   ]                  No   [   ]  If No, reason:     Discharge Orders    Future Appointments: Provider: Department: Dept Phone: Center:   01/09/2013 12:00 PM Loreli Slot, MD [facility] [phone] [facility]       Medication List     As of 12/19/2012 10:02 AM    STOP taking these medications         mupirocin ointment 2 %   Commonly known as: BACTROBAN      TAKE these medications         allopurinol 300 MG tablet   Commonly known as: ZYLOPRIM   Take 300 mg by mouth daily.      aspirin 81 MG tablet   Take 81 mg by mouth daily.      digoxin 0.125 MG tablet   Commonly known as: LANOXIN   Take 1 tablet (0.125 mg total) by mouth daily.      diltiazem 60 MG tablet   Commonly known as: CARDIZEM   Take 1 tablet (60 mg total) by mouth every 6 (six) hours.      fish oil-omega-3 fatty acids 1000 MG capsule   Take 1 g by mouth daily.      furosemide 40 MG tablet   Commonly known as: LASIX   Take 1 tablet (40 mg total) by mouth daily. For 5 days      metoprolol tartrate 25 MG tablet   Commonly known as: LOPRESSOR   Take 1.5 tablets (37.5 mg total) by mouth 2 (two) times daily.      niacin 500 MG tablet   Take 500 mg by mouth daily with breakfast.      omeprazole 20 MG capsule   Commonly known as: PRILOSEC   Take 20 mg by mouth daily.      potassium chloride SA 20 MEQ tablet   Commonly known as: K-DUR,KLOR-CON   Take 1 tablet (20 mEq total) by mouth 2 (two) times daily. For 5 days      Rivaroxaban 20 MG Tabs   Commonly known as: XARELTO   Take 1 tablet (20 mg total) by mouth daily.      simvastatin 40 MG tablet   Commonly known as: ZOCOR   Take 40 mg by mouth every morning.      traMADol 50 MG tablet   Commonly known as: ULTRAM   Take 1-2 tablets (50-100 mg total)  by mouth every 6 (six) hours as needed.      triamterene-hydrochlorothiazide 37.5-25 MG per tablet   Commonly known as: MAXZIDE-25   Take 1 tablet by mouth daily.           Follow-up Information    Follow up with Thurmon Fair, MD. On 01/01/2013. (at 11:00 AM)    Contact information:   [address] [place] [address] [phone]       Follow up with Loreli Slot, MD. On 01/09/2013. (Appointment is at 12:00)    Contact information:   [address] [employer] [address] [place] [address] [phone]       Follow up with [facility]. On 01/09/2013. (Please get CXR 1 hour prior to appointment with Dr. Dorris Fetch)    Contact information:   [place]          Signed: Lashica Hannay 12/19/2012, 10:02 AM

## 2012-12-19 NOTE — Progress Notes (Signed)
[facility]  DAILY PROGRESS NOTE   Subjective:  No events overnight. No chest pain. Has been maintaining sinus rhythm.  Objective:  Temp:  [97.6 F (36.4 C)-98.5 F (36.9 C)] 98.5 F (36.9 C) (12/24 0515) Pulse Rate:  [75-109] 75  (12/24 0515) Resp:  [18-20] 18  (12/24 0515) BP: (94-103)/(43-66) 103/65 mmHg (12/24 0515) SpO2:  [92 %-95 %] 92 % (12/24 0515) Weight:  [93.441 kg (206 lb)] 93.441 kg (206 lb) (12/24 0515) Weight change: -0.359 kg (-12.7 oz)  Intake/Output from previous day: 12/23 0701 - 12/24 0700 In: 400  Out: 850 [Urine:850]  Intake/Output from this shift:    Medications: Current Facility-Administered Medications  Medication Dose Route Frequency Provider Last Rate Last Dose  . 0.9 %  sodium chloride infusion  250 mL Intravenous PRN Loreli Slot, MD      . allopurinol (ZYLOPRIM) tablet 300 mg  300 mg Oral Daily Wilmon Pali, PA   300 mg at 12/18/12 1118  . ALPRAZolam (XANAX) tablet 0.25 mg  0.25 mg Oral TID PRN Lowella Dandy, PA   0.25 mg at 12/17/12 2349  . alum & mag hydroxide-simeth (MAALOX/MYLANTA) 200-200-20 MG/5ML suspension 15-30 mL  15-30 mL Oral Q4H PRN Loreli Slot, MD      . aspirin EC tablet 81 mg  81 mg Oral Daily Alleen Borne, MD       Or  . aspirin chewable tablet 324 mg  324 mg Per Tube Daily Alleen Borne, MD      . atorvastatin (LIPITOR) tablet 20 mg  20 mg Oral q1800 Loreli Slot, MD   20 mg at 12/18/12 2009  . bisacodyl (DULCOLAX) EC tablet 10 mg  10 mg Oral Daily Wilmon Pali, PA   10 mg at 12/15/12 1000  . digoxin (LANOXIN) tablet 0.125 mg  0.125 mg Oral Daily Ardelle Balls, PA   0.125 mg at 12/18/12 1118  . diltiazem (CARDIZEM) tablet 60 mg  60 mg Oral Q6H Erin Barrett, PA      . docusate sodium (COLACE) capsule 200 mg  200 mg Oral Daily Wilmon Pali, PA   200 mg at 12/18/12 1118  . furosemide (LASIX) tablet 40 mg  40 mg Oral Daily Abelino Derrick, [place]   40 mg at 12/18/12  1118  . magnesium hydroxide (MILK OF MAGNESIA) suspension 30 mL  30 mL Oral Daily PRN Loreli Slot, MD      . metoprolol (LOPRESSOR) injection 2.5-5 mg  2.5-5 mg Intravenous Q2H PRN Wilmon Pali, PA   5 mg at 12/15/12 1951  . metoprolol tartrate (LOPRESSOR) tablet 37.5 mg  37.5 mg Oral BID Eda Paschal Tiffin, PA   37.5 mg at 12/18/12 2141  . mupirocin ointment (BACTROBAN) 2 %   Nasal BID Loreli Slot, MD      . niacin tablet 500 mg  500 mg Oral Q breakfast Loreli Slot, MD   500 mg at 12/19/12 0844  . omega-3 acid ethyl esters (LOVAZA) capsule 1 g  1 g Oral Daily Loreli Slot, MD   1 g at 12/18/12 1118  . ondansetron (ZOFRAN) injection 4 mg  4 mg Intravenous Q6H PRN Wilmon Pali, PA   4 mg at 12/14/12 1336  . pantoprazole (PROTONIX) EC tablet 40 mg  40 mg Oral Daily Wilmon Pali, PA   40 mg at 12/18/12 1124  . potassium chloride SA (K-DUR,KLOR-CON) CR tablet 20  mEq  20 mEq Oral BID Loreli Slot, MD   20 mEq at 12/18/12 2141  . Rivaroxaban (XARELTO) tablet 20 mg  20 mg Oral Daily Chrystie Nose, MD   20 mg at 12/18/12 1241  . sodium chloride 0.9 % injection 3 mL  3 mL Intravenous Q12H Loreli Slot, MD   3 mL at 12/17/12 1000  . sodium chloride 0.9 % injection 3 mL  3 mL Intravenous PRN Loreli Slot, MD      . traMADol Janean Sark) tablet 100 mg  100 mg Oral Q6H PRN Loreli Slot, MD   100 mg at 12/18/12 1948  . traMADol (ULTRAM) tablet 50 mg  50 mg Oral Q6H PRN Loreli Slot, MD   50 mg at 12/18/12 1124  . triamterene-hydrochlorothiazide (MAXZIDE-25) 37.5-25 MG per tablet 1 each  1 each Oral Daily Loreli Slot, MD   1 each at 12/18/12 1644    Physical Exam: General appearance: no distress Neck: no adenopathy, no carotid bruit, supple, symmetrical, trachea midline and thyroid not enlarged, symmetric, no tenderness/mass/nodules Lungs: clear to auscultation bilaterally Heart: regular rate and rhythm Abdomen: soft,  non-tender; bowel sounds normal; no masses,  no organomegaly Extremities: extremities normal, atraumatic, no cyanosis or edema Pulses: 2+ and symmetric  Lab Results: Results for orders placed during the hospital encounter of 12/12/12 (from the past 48 hour(s))  BASIC METABOLIC PANEL     Status: Abnormal   Collection Time   12/18/12 11:50 AM      Component Value Range Comment   Sodium 135  135 - 145 mEq/L    Potassium 3.7  3.5 - 5.1 mEq/L    Chloride 97  96 - 112 mEq/L    CO2 25  19 - 32 mEq/L    Glucose, Bld 136 (*) 70 - 99 mg/dL    BUN 24 (*) 6 - 23 mg/dL    Creatinine, Ser 1.61  0.50 - 1.35 mg/dL    Calcium 9.0  8.4 - 09.6 mg/dL    GFR calc non Af Amer 56 (*) >90 mL/min    GFR calc Af Amer 65 (*) >90 mL/min   MAGNESIUM     Status: Normal   Collection Time   12/18/12 11:50 AM      Component Value Range Comment   Magnesium 2.0  1.5 - 2.5 mg/dL   DIGOXIN LEVEL     Status: Normal   Collection Time   12/19/12  5:45 AM      Component Value Range Comment   Digoxin Level 0.8  0.8 - 2.0 ng/mL     Imaging: No results found.  Assessment:  1. Principal Problem: 2.  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 Active Problems: 4.  HYPERLIPIDEMIA 5.  HYPERTENSION 6.  Angina effort 7.  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  PAF (paroxysmal atrial fibrillation), post CABG 9.  Abnormal nuclear cardiac imaging test as an OP 10.  Polio 11.  NSVT (nonsustained ventricular tachycardia), 9 beats 12/23 12.   Plan:  1. Continues in sinus rhythm. Plan to convert to po cardizem today. Continue digoxin, metoprolol and xarelto. Hopefully if he maintains sinus today, may be ready for discharge tomorrow.  Time Spent Directly with Patient:  15 minutes  Length of Stay:  LOS: 7 days   Chrystie Nose, MD, [facility] Attending Cardiologist [facility]  HILTY,Kenneth C 12/19/2012, 9:46 AM

## 2012-12-19 NOTE — Progress Notes (Signed)
CARDIAC REHAB PHASE I   PRE:  Rate/Rhythm: Sinus Rhythm 80  BP:  Supine: 104/60       SaO2: 94% Room Air  MODE:  Ambulation: 540 ft   POST:  Rate/Rhythm: 76  BP:    Sitting: 118/60    SaO2: 97% Room Air  [phone] Cardiac rehab phase 1 Discharge education complete.  Patient is interested in outpatient cardiac rehab in [place].  Patient tolerated walk without complaints.  Assisted back to recliner with call bell within reach. Patient given instructions to watch DC [facility] video with wife later today.  Harlon Flor, Arta [name]

## 2012-12-19 NOTE — Progress Notes (Addendum)
Called regarding Mr. Oviatt, he had another episode of rapid atrial fibrillation today. Amazing that he continues to break-through at a high ventricular rate.  At this point, I think we need to consider anti-arryhthmic therapy to help maintain sinus.  I recommend starting sotalol 80 mg BID. I will decrease metoprolol back to 25 mg BID. Continue cardizem and digoxin. Watch closely for QRS widening and QTc changes as well as bradycardia. Will have a repeat 12 lead EKG in the am. Discussed with Lowella Dandy, PA-C with [facility], she is in agreement.   Chrystie Nose, MD, [facility] Attending Cardiologist [facility]

## 2012-12-19 NOTE — Progress Notes (Signed)
Ambulated 150 ft using front wheel walker. Patient stated he felt a little light-headed; returned to room. Patient had small  Burst of A-fib during ambulation but returned to SR. Will continue to monitor. Call bell and family near. Hunter [name]

## 2012-12-19 NOTE — Progress Notes (Addendum)
7 Days Post-Op Procedure(s) (LRB): CORONARY ARTERY BYPASS GRAFTING (CABG) (N/A) Subjective:  Mr. Bloyd has no complaints this morning.  He has converted to NSR.  +BM  Objective: Vital signs in last 24 hours: Temp:  [97.6 F (36.4 C)-98.5 F (36.9 C)] 98.5 F (36.9 C) (12/24 0515) Pulse Rate:  [75-109] 75  (12/24 0515) Cardiac Rhythm:  [-] Normal sinus rhythm (12/23 1945) Resp:  [18-20] 18  (12/24 0515) BP: (94-103)/(43-66) 103/65 mmHg (12/24 0515) SpO2:  [92 %-95 %] 92 % (12/24 0515) Weight:  [206 lb (93.441 kg)] 206 lb (93.441 kg) (12/24 0515)  Intake/Output from previous day: 12/23 0701 - 12/24 0700 In: 400  Out: 850 [Urine:850]  General appearance: alert, cooperative and no distress Heart: regular rate and rhythm Lungs: clear to auscultation bilaterally Abdomen: soft, non-tender; bowel sounds normal; no masses,  no organomegaly Extremities: edema trace Wound: clean and dry  Lab Results: No results found for this basename: WBC:2,HGB:2,HCT:2,PLT:2 in the last 72 hours BMET:  [facility] 12/18/12 1150 12/17/12 0520  NA 135 136  K 3.7 3.8  CL 97 98  CO2 25 28  GLUCOSE 136* 95  BUN 24* 23  CREATININE 1.29 1.22  CALCIUM 9.0 8.9    PT/INR: No results found for this basename: LABPROT,INR in the last 72 hours ABG    Component Value Date/Time   PHART 7.389 12/13/2012 0648   HCO3 25.3* 12/13/2012 0648   TCO2 25 12/13/2012 1619   ACIDBASEDEF 2.0 12/13/2012 0515   O2SAT 98.0 12/13/2012 0648   CBG (last 3)  No results found for this basename: GLUCAP:3 in the last 72 hours  Assessment/Plan: S/P Procedure(s) (LRB): CORONARY ARTERY BYPASS GRAFTING (CABG) (N/A)  1. CV- previous Atrial Fibrillation, currently NSR- will d/c Cardizem gtt, start 60mg  QID, continue dig, lopressor and Xarelto 2. Pulm- no acute issues, continue IS 3. Volume overload- weight is stable, continue diuresis 4. Dispo- patient doing well, if maintains NSR can possibly d/c in AM   LOS: 7 days     Lowella Dandy 12/19/2012    Chart reviewed, patient examined, agree with above. Home tomorrow if maintaining sinus on oral meds.

## 2012-12-20 DIAGNOSIS — Z7901 Long term (current) use of anticoagulants: Secondary | ICD-10-CM

## 2012-12-20 LAB — BASIC METABOLIC PANEL
BUN: 26 mg/dL — ABNORMAL HIGH (ref 6–23)
CO2: 28 mEq/L (ref 19–32)
Calcium: 9.2 mg/dL (ref 8.4–10.5)
Chloride: 95 mEq/L — ABNORMAL LOW (ref 96–112)
Creatinine, Ser: 1.4 mg/dL — ABNORMAL HIGH (ref 0.50–1.35)
GFR calc Af Amer: 59 mL/min — ABNORMAL LOW (ref >90)
GFR calc non Af Amer: 51 mL/min — ABNORMAL LOW (ref >90)
Glucose, Bld: 120 mg/dL — ABNORMAL HIGH (ref 70–99)
Potassium: 3.4 mEq/L — ABNORMAL LOW (ref 3.5–5.1)
Sodium: 136 mEq/L (ref 135–145)

## 2012-12-20 MED ORDER — TRAMADOL HCL 50 MG PO TABS: 50.0000 mg | ORAL_TABLET | Freq: Four times a day (QID) | ORAL | Status: DC | PRN

## 2012-12-20 NOTE — Progress Notes (Addendum)
8 Days Post-Op Procedure(s) (LRB): CORONARY ARTERY BYPASS GRAFTING (CABG) (N/A) Subjective:  Mr. Yonker is without complaints this morning.  He did have brief episode of Atrial Fibrillation while walking yesterday with immediate conversion to NSR.   Objective: Vital signs in last 24 hours: Temp:  [97.9 F (36.6 C)-98 F (36.7 C)] 97.9 F (36.6 C) (12/25 0614) Pulse Rate:  [73-93] 74  (12/25 1610) Cardiac Rhythm:  [-] Normal sinus rhythm (12/24 2000) Resp:  [17-18] 18  (12/25 0614) BP: (101-111)/(57-67) 101/67 mmHg (12/25 0614) SpO2:  [95 %-97 %] 95 % (12/25 0614) Weight:  [200 lb 4.8 oz (90.855 kg)] 200 lb 4.8 oz (90.855 kg) (12/25 9604)  Intake/Output from previous day: 12/24 0701 - 12/25 0700 In: -  Out: 1250 [Urine:1250]  General appearance: alert, cooperative and no distress Heart: regular rate and rhythm Lungs: clear to auscultation bilaterally Abdomen: soft, non-tender; bowel sounds normal; no masses,  no organomegaly Extremities: edema trace Wound: clean and dry  Lab Results: No results found for this basename: WBC:2,HGB:2,HCT:2,PLT:2 in the last 72 hours BMET:  [facility] 12/18/12 1150  NA 135  K 3.7  CL 97  CO2 25  GLUCOSE 136*  BUN 24*  CREATININE 1.29  CALCIUM 9.0    PT/INR: No results found for this basename: LABPROT,INR in the last 72 hours ABG    Component Value Date/Time   PHART 7.389 12/13/2012 0648   HCO3 25.3* 12/13/2012 0648   TCO2 25 12/13/2012 1619   ACIDBASEDEF 2.0 12/13/2012 0515   O2SAT 98.0 12/13/2012 0648   CBG (last 3)  No results found for this basename: GLUCAP:3 in the last 72 hours  Assessment/Plan: S/P Procedure(s) (LRB): CORONARY ARTERY BYPASS GRAFTING (CABG) (N/A)  1. CV- NSR, brief episode of Atrial Fibrillation yesterday- on Cardizem, Lopressor, Digoxin, Xarelto, and Sotalol started yesterday per Cardiology recommendations 2. Pulm- no acute issues, continue IS 3. Volume overload- continue diuresis 4. Dispo- patient  doing well, brief episode of Atrial Fibrillation, will need to monitor patient on Sotalol- per surgery standpoint ready for discharge, will defer to Cardiology in regards to continued use of Sotalol and when patient appropriate for discharge   LOS: 8 days    BARRETT, ERIN 12/20/2012   Patient needs to remain hospitalized while sotalol is being loaded otherwise doing well mainly in sinus rhythm

## 2012-12-20 NOTE — Progress Notes (Signed)
Subjective:  Up without problem  Objective:  Vital Signs in the last 24 hours: Temp:  [97.9 F (36.6 C)-98 F (36.7 C)] 97.9 F (36.6 C) (12/25 8119) Pulse Rate:  [73-93] 74  (12/25 0614) Resp:  [17-18] 18  (12/25 0614) BP: (101-111)/(57-67) 101/67 mmHg (12/25 0614) SpO2:  [95 %-97 %] 95 % (12/25 0614) Weight:  [90.855 kg (200 lb 4.8 oz)] 90.855 kg (200 lb 4.8 oz) (12/25 1478)  Intake/Output from previous day:  Intake/Output Summary (Last 24 hours) at 12/20/12 0855 Last data filed at 12/19/12 1617  Gross per 24 hour  Intake      0 ml  Output   1250 ml  Net  -1250 ml    Physical Exam: General appearance: alert, cooperative and no distress Lungs: clear to auscultation bilaterally Heart: regular rate and rhythm   Rate: 76  Rhythm: normal sinus rhythm  Lab Results: No results found for this basename: WBC:2,HGB:2,PLT:2 in the last 72 hours  Basename 12/18/12 1150  NA 135  K 3.7  CL 97  CO2 25  GLUCOSE 136*  BUN 24*  CREATININE 1.29   No results found for this basename: TROPONINI:2,CK,MB:2 in the last 72 hours Hepatic Function Panel No results found for this basename: PROT,ALBUMIN,AST,ALT,ALKPHOS,BILITOT,BILIDIR,IBILI in the last 72 hours No results found for this basename: CHOL in the last 72 hours No results found for this basename: INR in the last 72 hours  Imaging: Imaging results have been reviewed  Cardiac Studies:  Assessment/Plan:   Principal Problem:  *S/P CABG x 4, 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 Lopressor cut back. He is on Xarelto. He will need to remain monitored while loading with Sotolol, (he had NSVT 12/23). Check K+   Corine Shelter PA-C 12/20/2012, 8:55 AM    Agree with note written by Corine Shelter PAC  Started on Sotalol yesterday for breakthrough recurrent PAF with RVR. On Xeralto. He has only had 2 doses and QTc is 465 msec. NSR since last PM. Prefer to wait until tomorrow to check EKG prior to D/C home.  Runell Gess 12/20/2012 9:19 AM

## 2012-12-20 NOTE — Progress Notes (Signed)
EPWs pulled per protocol.  No ectopy or other problem noted at this time.  Instructed on need for 1 hr of bed rest.  Will continue to monitor.

## 2012-12-21 LAB — BASIC METABOLIC PANEL
BUN: 28 mg/dL — ABNORMAL HIGH (ref 6–23)
CO2: 27 mEq/L (ref 19–32)
Calcium: 9.3 mg/dL (ref 8.4–10.5)
Chloride: 97 mEq/L (ref 96–112)
Creatinine, Ser: 1.37 mg/dL — ABNORMAL HIGH (ref 0.50–1.35)
GFR calc Af Amer: 61 mL/min — ABNORMAL LOW (ref >90)
GFR calc non Af Amer: 52 mL/min — ABNORMAL LOW (ref >90)
Glucose, Bld: 101 mg/dL — ABNORMAL HIGH (ref 70–99)
Potassium: 3.8 mEq/L (ref 3.5–5.1)
Sodium: 137 mEq/L (ref 135–145)

## 2012-12-21 LAB — MAGNESIUM: Magnesium: 2.3 mg/dL (ref 1.5–2.5)

## 2012-12-21 MED ORDER — SOTALOL HCL 80 MG PO TABS: 80.0000 mg | ORAL_TABLET | Freq: Two times a day (BID) | ORAL | Status: DC

## 2012-12-21 MED ORDER — METOPROLOL TARTRATE 25 MG PO TABS: 25.0000 mg | ORAL_TABLET | Freq: Two times a day (BID) | ORAL | Status: DC

## 2012-12-21 NOTE — Progress Notes (Signed)
9 Days Post-Op Procedure(s) (LRB): CORONARY ARTERY BYPASS GRAFTING (CABG) (N/A) Subjective: No complaints  Objective: Vital signs in last 24 hours: Temp:  [97.5 F (36.4 C)-99 F (37.2 C)] 97.5 F (36.4 C) (12/26 0411) Pulse Rate:  [76-89] 76  (12/26 0411) Cardiac Rhythm:  [-] Normal sinus rhythm (12/26 0411) Resp:  [18] 18  (12/26 0411) BP: (100-119)/(64-78) 108/71 mmHg (12/26 0411) SpO2:  [93 %-94 %] 93 % (12/26 0411) Weight:  [89.7 kg (197 lb 12 oz)] 89.7 kg (197 lb 12 oz) (12/26 0411)  No A-fib yesterday on Sotolol  Hemodynamic parameters for last 24 hours:    Intake/Output from previous day: 12/25 0701 - 12/26 0700 In: 363 [P.O.:360; I.V.:3] Out: -  Intake/Output this shift:    General appearance: alert and cooperative Heart: regular rate and rhythm Lungs: clear to auscultation bilaterally Extremities: edema none Wound: incision ok  Lab Results: No results found for this basename: WBC:2,HGB:2,HCT:2,PLT:2 in the last 72 hours BMET:  [facility] 12/21/12 0512 12/20/12 0936  NA 137 136  K 3.8 3.4*  CL 97 95*  CO2 27 28  GLUCOSE 101* 120*  BUN 28* 26*  CREATININE 1.37* 1.40*  CALCIUM 9.3 9.2    PT/INR: No results found for this basename: LABPROT,INR in the last 72 hours ABG    Component Value Date/Time   PHART 7.389 12/13/2012 0648   HCO3 25.3* 12/13/2012 0648   TCO2 25 12/13/2012 1619   ACIDBASEDEF 2.0 12/13/2012 0515   O2SAT 98.0 12/13/2012 0648   CBG (last 3)  No results found for this basename: GLUCAP:3 in the last 72 hours  Assessment/Plan: S/P Procedure(s) (LRB): CORONARY ARTERY BYPASS GRAFTING (CABG) (N/A) Maintaining sinus rhythm on Sotolol and close to finishing load. ECG this am shows stable QTc 460's. Will plan to discharge today.   LOS: 9 days    Antwonette Feliz K 12/21/2012

## 2012-12-21 NOTE — Progress Notes (Signed)
Pt discharged per MD order and protocol. All discharge instructions reviewed with patient and wife. Pt aware of all follow up appointments. All prescriptions given.

## 2012-12-21 NOTE — Progress Notes (Signed)
Chest tube sutures removed per MD order and protocol. Sterri Strips applied. Pt tolerated procedure well. Will continue to monitor.

## 2012-12-21 NOTE — Discharge Summary (Signed)
[address]            [place] [address]          [phone]         Discharge Summary-ADDENDUM  Name: Hunter [name] DOB: 1946-06-22 66 y.o. MRN: [id number]   Admission Date: 12/12/2012 Discharge Date: 12/21/2012    Additional Hospital Course:   The patient was originally scheduled for discharge home on 12/19/2012.  However, he continued to have episodes of rapid atrial fibrillation with high ventricular rates.  Cardiology saw the patient and added Sotalol to his medication regimen as well as decreasing his dose of Metoprolol.  He has remained in the hospital for monitoring while completing his Sotalol load.  His QTc has remained stable, and he has maintained sinus rhythm for the past 48 hours.  He has otherwise remained stable from the previous discharge summary.  We anticipate discharge home on today's date if no acute changes occur.      Recent vital signs:  Filed Vitals:   12/21/12 0411  BP: 108/71  Pulse: 76  Temp: 97.5 F (36.4 C)  Resp: 18    Recent laboratory studies:  CBC:No results found for this basename: WBC:2,HGB:2,HCT:2,PLT:2 in the last 72 hours BMET:  [facility] 12/21/12 0512 12/20/12 0936  NA 137 136  K 3.8 3.4*  CL 97 95*  CO2 27 28  GLUCOSE 101* 120*  BUN 28* 26*  CREATININE 1.37* 1.40*  CALCIUM 9.3 9.2    PT/INR: No results found for this basename: LABPROT,INR in the last 72 hours   Discharge Medications:     Medication List     As of 12/21/2012  9:46 AM    STOP taking these medications         mupirocin ointment 2 %   Commonly known as: BACTROBAN      TAKE these medications         allopurinol 300 MG tablet   Commonly known as: ZYLOPRIM   Take 300 mg by mouth daily.      aspirin 81 MG tablet   Take 81 mg by mouth daily.      digoxin 0.125 MG tablet   Commonly known as: LANOXIN   Take 1 tablet (0.125 mg total) by mouth daily.      diltiazem 60 MG tablet   Commonly known as:  CARDIZEM   Take 1 tablet (60 mg total) by mouth every 6 (six) hours.      fish oil-omega-3 fatty acids 1000 MG capsule   Take 1 g by mouth daily.      furosemide 40 MG tablet   Commonly known as: LASIX   Take 1 tablet (40 mg total) by mouth daily. For 5 days      metoprolol tartrate 25 MG tablet   Commonly known as: LOPRESSOR   Take 1 tablet (25 mg total) by mouth 2 (two) times daily.      niacin 500 MG tablet   Take 500 mg by mouth daily with breakfast.      omeprazole 20 MG capsule   Commonly known as: PRILOSEC   Take 20 mg by mouth daily.      potassium chloride SA 20 MEQ tablet   Commonly known as: K-DUR,KLOR-CON   Take 1 tablet (20 mEq total) by mouth 2 (two) times daily. For 5 days      Rivaroxaban 20 MG  Tabs   Commonly known as: XARELTO   Take 1 tablet (20 mg total) by mouth daily.      simvastatin 40 MG tablet   Commonly known as: ZOCOR   Take 40 mg by mouth every morning.      sotalol 80 MG tablet   Commonly known as: BETAPACE   Take 1 tablet (80 mg total) by mouth every 12 (twelve) hours.      traMADol 50 MG tablet   Commonly known as: ULTRAM   Take 1-2 tablets (50-100 mg total) by mouth every 6 (six) hours as needed.      traMADol 50 MG tablet   Commonly known as: ULTRAM   Take 1-2 tablets (50-100 mg total) by mouth every 6 (six) hours as needed.      triamterene-hydrochlorothiazide 37.5-25 MG per tablet   Commonly known as: MAXZIDE-25   Take 1 tablet by mouth daily.         Discharge Instructions:  The patient is to refrain from driving, heavy lifting or strenuous activity.  May shower daily and clean incisions with soap and water.  May resume regular diet.   Follow Up:      Discharge Orders    Future Appointments: Provider: Department: Dept Phone: Center:   01/09/2013 12:00 PM Loreli Slot, MD [facility] [phone] [facility]     Future Orders Please Complete By Expires   Amb Referral to  Cardiac Rehabilitation      Comments:   Patient gives permission for [facility] to contact patient about cardiac rehab      Follow-up Information    Follow up with [facility], MD. On 01/01/2013. (at 11:00 AM)    Contact information:   [address] [place] [address] [phone]       Follow up with Loreli Slot, MD. On 01/09/2013. (Appointment is at 12:00)    Contact information:   [address] [employer] [address] [place] [address] [phone]       Follow up with  [facility]. On 01/09/2013. (Please get CXR 1 hour prior to appointment with Dr. Dorris Fetch)    Contact information:   [place]           Tharun Cappella H 12/21/2012, 9:46 AM

## 2012-12-21 NOTE — Progress Notes (Signed)
CARDIAC REHAB PHASE I   PRE:  Rate/Rhythm: 76 SR  BP:  Supine:   Sitting: 94/60  Standing:    SaO2: 93 RA  MODE:  Ambulation: 350 ft   POST:  Rate/Rhythem: 78  BP:  Supine:   Sitting: 100/62  Standing:    SaO2: 98 RA 0905-0930 Assisted X 1 and used walker to ambulate. Pt tolerated ambulation well. He c/o of being tired and wanted to go back to room after 350 feet. I was unable to get him to go any further. Pt went to bed after walk with call light in reach and wife present. Reviewed discharge  education with pt. Encouraged him to increase his distance daily, use IS and about Outpt. CRP. Pt and wife voice understanding.  Beatrix Fetters

## 2012-12-21 NOTE — Progress Notes (Signed)
[facility]  DAILY PROGRESS NOTE   Subjective:  Sternal soreness, otherwise well. No AF since 7PM on 12/24. QTc around .  Objective:  Temp:  [97.5 F (36.4 C)-99 F (37.2 C)] 97.5 F (36.4 C) (12/26 0411) Pulse Rate:  [76-89] 76  (12/26 0411) Resp:  [18] 18  (12/26 0411) BP: (100-119)/(64-78) 108/71 mmHg (12/26 0411) SpO2:  [93 %-94 %] 93 % (12/26 0411) Weight:  [89.7 kg (197 lb 12 oz)] 89.7 kg (197 lb 12 oz) (12/26 0411) Weight change: -1.156 kg (-2 lb 8.8 oz)  Intake/Output from previous day: 12/25 0701 - 12/26 0700 In: 363 [P.O.:360; I.V.:3] Out: -   Intake/Output from this shift:    Medications: Current Facility-Administered Medications  Medication Dose Route Frequency Provider Last Rate Last Dose  . 0.9 %  sodium chloride infusion  250 mL Intravenous PRN Loreli Slot, MD      . allopurinol (ZYLOPRIM) tablet 300 mg  300 mg Oral Daily Wilmon Pali, PA   300 mg at 12/20/12 1117  . ALPRAZolam (XANAX) tablet 0.25 mg  0.25 mg Oral TID PRN Lowella Dandy, PA   0.25 mg at 12/17/12 2349  . alum & mag hydroxide-simeth (MAALOX/MYLANTA) 200-200-20 MG/5ML suspension 15-30 mL  15-30 mL Oral Q4H PRN Loreli Slot, MD      . aspirin EC tablet 81 mg  81 mg Oral Daily Alleen Borne, MD   81 mg at 12/20/12 1117   Or  . aspirin chewable tablet 324 mg  324 mg Per Tube Daily Alleen Borne, MD      . atorvastatin (LIPITOR) tablet 20 mg  20 mg Oral q1800 Loreli Slot, MD   20 mg at 12/20/12 1742  . bisacodyl (DULCOLAX) EC tablet 10 mg  10 mg Oral Daily Wilmon Pali, PA   10 mg at 12/15/12 1000  . digoxin (LANOXIN) tablet 0.125 mg  0.125 mg Oral Daily Ardelle Balls, PA   0.125 mg at 12/20/12 1117  . docusate sodium (COLACE) capsule 200 mg  200 mg Oral Daily Wilmon Pali, PA   200 mg at 12/19/12 1051  . furosemide (LASIX) tablet 40 mg  40 mg Oral Daily Eda Paschal [place], [place]   40 mg at 12/20/12 1117  . magnesium hydroxide (MILK  OF MAGNESIA) suspension 30 mL  30 mL Oral Daily PRN Loreli Slot, MD      . metoprolol (LOPRESSOR) injection 2.5-5 mg  2.5-5 mg Intravenous Q2H PRN Wilmon Pali, PA   5 mg at 12/15/12 1951  . metoprolol tartrate (LOPRESSOR) tablet 25 mg  25 mg Oral BID Chrystie Nose, MD   25 mg at 12/20/12 2130  . niacin tablet 500 mg  500 mg Oral Q breakfast Loreli Slot, MD   500 mg at 12/21/12 0558  . omega-3 acid ethyl esters (LOVAZA) capsule 1 g  1 g Oral Daily Loreli Slot, MD   1 g at 12/20/12 1117  . ondansetron (ZOFRAN) injection 4 mg  4 mg Intravenous Q6H PRN Wilmon Pali, PA   4 mg at 12/14/12 1336  . pantoprazole (PROTONIX) EC tablet 40 mg  40 mg Oral Daily Wilmon Pali, PA   40 mg at 12/20/12 1117  . potassium chloride SA (K-DUR,KLOR-CON) CR tablet 20 mEq  20 mEq Oral BID Loreli Slot, MD   20 mEq at 12/20/12 2130  . Rivaroxaban (XARELTO) tablet 20 mg  20  mg Oral Daily Chrystie Nose, MD   20 mg at 12/20/12 1117  . sodium chloride 0.9 % injection 3 mL  3 mL Intravenous Q12H Loreli Slot, MD   3 mL at 12/20/12 2131  . sodium chloride 0.9 % injection 3 mL  3 mL Intravenous PRN Loreli Slot, MD      . sotalol (BETAPACE) tablet 80 mg  80 mg Oral Q12H Chrystie Nose, MD   80 mg at 12/20/12 2020  . traMADol (ULTRAM) tablet 100 mg  100 mg Oral Q6H PRN Loreli Slot, MD   100 mg at 12/20/12 2020  . traMADol (ULTRAM) tablet 50 mg  50 mg Oral Q6H PRN Loreli Slot, MD   50 mg at 12/20/12 0215  . triamterene-hydrochlorothiazide (MAXZIDE-25) 37.5-25 MG per tablet 1 each  1 each Oral Daily Loreli Slot, MD   1 each at 12/20/12 1117    Physical Exam: General appearance: alert, cooperative and no distress Neck: no adenopathy, no carotid bruit, no JVD, supple, symmetrical, trachea midline and thyroid not enlarged, symmetric, no tenderness/mass/nodules Lungs: clear to auscultation bilaterally Heart: regular rate and rhythm, S1, S2  normal, no murmur, click, rub or gallop and sternotomy healing well Abdomen: soft, non-tender; bowel sounds normal; no masses,  no organomegaly Extremities: extremities normal, atraumatic, no cyanosis or edema Pulses: 2+ and symmetric Skin: Skin color, texture, turgor normal. No rashes or lesions Neurologic: Grossly normal  Lab Results: Results for orders placed during the hospital encounter of 12/12/12 (from the past 48 hour(s))  BASIC METABOLIC PANEL     Status: Abnormal   Collection Time   12/20/12  9:36 AM      Component Value Range Comment   Sodium 136  135 - 145 mEq/L    Potassium 3.4 (*) 3.5 - 5.1 mEq/L    Chloride 95 (*) 96 - 112 mEq/L    CO2 28  19 - 32 mEq/L    Glucose, Bld 120 (*) 70 - 99 mg/dL    BUN 26 (*) 6 - 23 mg/dL    Creatinine, Ser 4.09 (*) 0.50 - 1.35 mg/dL    Calcium 9.2  8.4 - 81.1 mg/dL    GFR calc non Af Amer 51 (*) >90 mL/min    GFR calc Af Amer 59 (*) >90 mL/min   BASIC METABOLIC PANEL     Status: Abnormal   Collection Time   12/21/12  5:12 AM      Component Value Range Comment   Sodium 137  135 - 145 mEq/L    Potassium 3.8  3.5 - 5.1 mEq/L    Chloride 97  96 - 112 mEq/L    CO2 27  19 - 32 mEq/L    Glucose, Bld 101 (*) 70 - 99 mg/dL    BUN 28 (*) 6 - 23 mg/dL    Creatinine, Ser 9.14 (*) 0.50 - 1.35 mg/dL    Calcium 9.3  8.4 - 78.2 mg/dL    GFR calc non Af Amer 52 (*) >90 mL/min    GFR calc Af Amer 61 (*) >90 mL/min   MAGNESIUM     Status: Normal   Collection Time   12/21/12  5:12 AM      Component Value Range Comment   Magnesium 2.3  1.5 - 2.5 mg/dL     Imaging: No results found.  Assessment:  1. Principal Problem: 2.  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 Active Problems: 4.  HYPERLIPIDEMIA 5.  HYPERTENSION 6.  Angina effort 7.  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  PAF, post CABG. Amio intol, Sotolol added 9.  Abnormal nuclear cardiac imaging test as an OP 10.  Polio 11.  NSVT (nonsustained ventricular  tachycardia), 9 beats 12/23 12.  Chronic anticoagulation, Xarelto 13.   Plan:  1. Arrhythmia has stabilized. Check QTc after next sotalol dose, but expect he will be ready to go home later today. Follow-up in [place] office January 9th. Will discuss whether we need long term antiarrhythmic/anticoagulation strategies at that time. Reviewed diet/exercise-rehab.  Time Spent Directly with Patient:  30 minutes  Length of Stay:  LOS: 9 days    Hunter [name] 12/21/2012, 9:15 AM

## 2013-01-05 ENCOUNTER — Other Ambulatory Visit: Payer: Self-pay | Admitting: *Deleted

## 2013-01-05 DIAGNOSIS — Z951 Presence of aortocoronary bypass graft: Secondary | ICD-10-CM

## 2013-01-09 ENCOUNTER — Ambulatory Visit
Admission: RE | Admit: 2013-01-09 | Discharge: 2013-01-09 | Disposition: A | Discharge status: Home / Self Care | Payer: Medicare Other | Source: Ambulatory Visit | Attending: Thoracic Surgery (Cardiothoracic Vascular Surgery) | Admitting: Thoracic Surgery (Cardiothoracic Vascular Surgery)

## 2013-01-09 ENCOUNTER — Ambulatory Visit (INDEPENDENT_AMBULATORY_CARE_PROVIDER_SITE_OTHER): Payer: Self-pay | Admitting: Thoracic Surgery (Cardiothoracic Vascular Surgery)

## 2013-01-09 ENCOUNTER — Encounter: Payer: Self-pay | Admitting: Thoracic Surgery (Cardiothoracic Vascular Surgery)

## 2013-01-09 VITALS — BP 113/77 | HR 61 | Resp 16 | Ht 68.0 in | Wt 191.0 lb

## 2013-01-09 DIAGNOSIS — Z951 Presence of aortocoronary bypass graft: Secondary | ICD-10-CM

## 2013-01-09 DIAGNOSIS — I2510 Atherosclerotic heart disease of native coronary artery without angina pectoris: Secondary | ICD-10-CM

## 2013-01-09 NOTE — Progress Notes (Signed)
HPI:  [name] returns today for a scheduled postoperative followup visit. He had coronary bypass grafting x 4 on 12/12/2012. His postoperative course was palpated by atrial fibrillation. He was eventually discharged home on Lopressor, diltiazem, and the [place]. He's overall he is doing well he does have some left shoulder pain which is musculoskeletal. He has not been taking narcotics. He also complains of lightheadedness and dizziness, which is worse when he first gets up. He's not had any anginal-type chest pain. He is anxious to resume full activities  Past Medical History  Diagnosis Date  . Polio     left arm atrophy  . Hyperlipidemia   . Hypertension     "does not have primary doctor"  . Coronary artery disease     sees Dr. Royann [name] with  [facility] heart  . Kidney stone     presently  . GERD (gastroesophageal reflux disease)   . Skin lesion of face     hx of  . Degenerative disc disease, lumbar   . Bursitis of right shoulder   . significant CAD requiring CABG 12/15/2012  . 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 allopurinol (ZYLOPRIM) 300 MG tablet Take 300 mg by mouth daily.      Marland Kitchen aspirin 81 MG tablet Take 81 mg by mouth daily.      . digoxin (LANOXIN) 0.125 MG tablet Take 1 tablet (0.125 mg total) by mouth daily.  30 tablet  1  . fish oil-omega-3 fatty acids 1000 MG capsule Take 1 g by mouth daily.      . metoprolol tartrate (LOPRESSOR) 25 MG tablet Take 1 tablet (25 mg total) by mouth 2 (two) times daily.  60 tablet  1  . niacin 500 MG tablet Take 500 mg by mouth daily with breakfast.      . omeprazole (PRILOSEC) 20 MG capsule Take 20 mg by mouth daily.      . Rivaroxaban (XARELTO) 20 MG TABS Take 1 tablet (20 mg total) by mouth daily.  30 tablet  1  . simvastatin (ZOCOR) 40 MG tablet Take 40 mg by mouth every morning.      . sotalol (BETAPACE) 80 MG tablet Take 1  tablet (80 mg total) by mouth every 12 (twelve) hours.  60 tablet  1  . traMADol (ULTRAM) 50 MG tablet Take 1-2 tablets (50-100 mg total) by mouth every 6 (six) hours as needed.  50 tablet  0  . triamterene-hydrochlorothiazide (MAXZIDE-25) 37.5-25 MG per tablet Take 1 tablet by mouth daily.      . clobetasol cream (TEMOVATE) 0.05 %       . mupirocin ointment (BACTROBAN) 2 %         Physical Exam BP 113/77  Pulse 61  Resp 16  Ht 5\' 8"  (1.727 m)  Wt 191 lb (86.637 kg)  BMI 29.04 kg/m2  SpO2 98% Gen. 67 year old male in no acute distress Alert and oriented x3 Lungs clear with equal breath sounds bilaterally Cardiac regular rate and rhythm normal S1 and S2 Sternum stable, incision clean dry and intact Leg incision well-healed, trace edema right leg  Diagnostic Tests: Chest x-ray 01/09/2013 *RADIOLOGY REPORT*  Clinical Data: Post heart surgery.  CHEST - 2 VIEW  Comparison: 12/16/2012  Findings: Prior CABG. Heart is normal size. No confluent airspace  opacities. Improving aeration of the lung bases. Trace residual  pleural effusions. No pneumothorax.  IMPRESSION:  Improving  aeration in the lung bases. Small residual pleural  effusions.    Impression: 67 year old gentleman now about a month out from coronary bypass grafting. He did have some atrial fibrillation in the postoperative period but is in sinus rhythm today. He is doing very well overall. However he is having some lightheadedness and dizziness particularly when he first stands up. I recommended that he stop the diltiazem. He should continue that digoxin and metoprolol for now.  He may begin driving, appropriate precautions were discussed. He is not to lift over 10 pounds for another 2 weeks. He may begin playing golf in 6-8 weeks.  He has a followup appointment with Dr. Royann [name] tomorrow. I recommended that he talked to him about starting cardiac rehabilitation, I think he is ready to begin that at any time.\  Plan:  I  will be happy to see [name] back any time the future if I may be of any further assistance with his care

## 2013-01-09 NOTE — Patient Instructions (Signed)
You may drive, but use extra caution initially  Do not lift over 10 pounds for at least another 2 weeks

## 2013-06-04 ENCOUNTER — Telehealth: Payer: Self-pay | Admitting: Cardiovascular Disease

## 2013-06-04 NOTE — Telephone Encounter (Signed)
Having dental work on 06-13-13-His doctor said he would need a prescription for an antibiotic! Would you please call this in for him at [facility]-[phone]-Please call this in today and let him know when you do it!

## 2013-06-04 NOTE — Telephone Encounter (Signed)
Dr. Royann Shivers notified and advised abx is not necessary.   Returned call.  Left message to call back tomorrow between 8am and 4pm.

## 2013-06-05 NOTE — Telephone Encounter (Signed)
Hunter [name] returned your call

## 2013-06-05 NOTE — Telephone Encounter (Signed)
Returned call.  Pt stated he called his dentist and told him that Dr. Salena Saner said he doesn't need abxs and he said fine.  Stated he doesn't need anything else.

## 2013-07-03 ENCOUNTER — Other Ambulatory Visit: Payer: Self-pay | Admitting: *Deleted

## 2013-07-03 MED ORDER — SIMVASTATIN 40 MG PO TABS: 40.0000 mg | ORAL_TABLET | Freq: Every morning | ORAL | Status: DC

## 2013-07-03 NOTE — Telephone Encounter (Signed)
Rx was sent to pharmacy electronically. 

## 2013-09-06 ENCOUNTER — Telehealth: Payer: Self-pay | Admitting: *Deleted

## 2013-09-06 DIAGNOSIS — E782 Mixed hyperlipidemia: Secondary | ICD-10-CM

## 2013-09-06 DIAGNOSIS — R5381 Other malaise: Secondary | ICD-10-CM

## 2013-09-06 DIAGNOSIS — Z79899 Other long term (current) drug therapy: Secondary | ICD-10-CM

## 2013-09-06 LAB — CBC
HCT: 43.4 % (ref 39.0–52.0)
Hemoglobin: 15.2 g/dL (ref 13.0–17.0)
MCH: 29.9 pg (ref 26.0–34.0)
MCHC: 35 g/dL (ref 30.0–36.0)
MCV: 85.3 fL (ref 78.0–100.0)
Platelets: 226 10*3/uL (ref 150–400)
RBC: 5.09 MIL/uL (ref 4.22–5.81)
RDW: 14.7 % (ref 11.5–15.5)
WBC: 7.3 10*3/uL (ref 4.0–10.5)

## 2013-09-06 NOTE — Telephone Encounter (Signed)
Patient walked in requesting lab order to have labs drawn today fasting.  Order entered and given to patient.

## 2013-09-07 LAB — COMPREHENSIVE METABOLIC PANEL
ALT: 16 U/L (ref 0–53)
AST: 21 U/L (ref 0–37)
Albumin: 4.4 g/dL (ref 3.5–5.2)
Alkaline Phosphatase: 99 U/L (ref 39–117)
BUN: 25 mg/dL — ABNORMAL HIGH (ref 6–23)
CO2: 30 mEq/L (ref 19–32)
Calcium: 9.7 mg/dL (ref 8.4–10.5)
Chloride: 102 mEq/L (ref 96–112)
Creat: 1.46 mg/dL — ABNORMAL HIGH (ref 0.50–1.35)
Glucose, Bld: 101 mg/dL — ABNORMAL HIGH (ref 70–99)
Potassium: 4.2 mEq/L (ref 3.5–5.3)
Sodium: 140 mEq/L (ref 135–145)
Total Bilirubin: 0.6 mg/dL (ref 0.3–1.2)
Total Protein: 7 g/dL (ref 6.0–8.3)

## 2013-09-07 LAB — LIPID PANEL
Cholesterol: 136 mg/dL (ref 0–200)
HDL: 47 mg/dL (ref >39)
LDL Cholesterol: 69 mg/dL (ref 0–99)
Total CHOL/HDL Ratio: 2.9 Ratio
Triglycerides: 102 mg/dL (ref <150)
VLDL: 20 mg/dL (ref 0–40)

## 2013-09-07 LAB — TSH: TSH: 1.818 u[IU]/mL (ref 0.350–4.500)

## 2013-09-10 ENCOUNTER — Encounter: Payer: Self-pay | Admitting: Cardiovascular Disease

## 2013-09-11 ENCOUNTER — Encounter: Payer: Self-pay | Admitting: Cardiovascular Disease

## 2013-09-11 ENCOUNTER — Ambulatory Visit (INDEPENDENT_AMBULATORY_CARE_PROVIDER_SITE_OTHER): Payer: Medicare Other | Admitting: Cardiovascular Disease

## 2013-09-11 VITALS — BP 120/88 | HR 66 | Resp 14 | Ht 69.0 in | Wt 199.2 lb

## 2013-09-11 DIAGNOSIS — I4891 Unspecified atrial fibrillation: Secondary | ICD-10-CM

## 2013-09-11 DIAGNOSIS — E785 Hyperlipidemia, unspecified: Secondary | ICD-10-CM

## 2013-09-11 DIAGNOSIS — I10 Essential (primary) hypertension: Secondary | ICD-10-CM

## 2013-09-11 DIAGNOSIS — Z951 Presence of aortocoronary bypass graft: Secondary | ICD-10-CM

## 2013-09-11 DIAGNOSIS — I480 Paroxysmal atrial fibrillation: Secondary | ICD-10-CM

## 2013-09-11 DIAGNOSIS — I2510 Atherosclerotic heart disease of native coronary artery without angina pectoris: Secondary | ICD-10-CM

## 2013-09-11 NOTE — Patient Instructions (Addendum)
Your physician recommends that you schedule a follow-up appointment in: 12 months Your physician encouraged you to lose weight for better health.    

## 2013-09-11 NOTE — Assessment & Plan Note (Signed)
Good control

## 2013-09-11 NOTE — Assessment & Plan Note (Signed)
He is asymptomatic. No evidence of coronary insufficiency. Excellent activity level, but have recommended he work on his diverticula some weight.

## 2013-09-11 NOTE — Assessment & Plan Note (Signed)
All values are within the desirable range. HDL cholesterol slightly low. Recommend increased physical activity and weight loss. Target weight roughly 170 pounds, waistline 34 inches.

## 2013-09-11 NOTE — Progress Notes (Signed)
Patient ID: Hunter [name], male   DOB: [birth year], 67 y.o.   MRN: [id number]     Reason for office visit Followup CAD status post CABG  Hunter [name] is feeling great. His only complaint is his golf game. He has been very physically active without any complaints of shortness of breath, angina, dizziness, syncope or any musculoskeletal complaints. He does not have erectile dysfunction or intermittent claudication. He does have problems with urinary frequency. He has a history of nephrolithiasis. He is due to see Dr. Vernie [name] soon    Allergies  Allergen Reactions  . Amiodarone     fasciculations    Current Outpatient Prescriptions  Medication Sig Dispense Refill  . allopurinol (ZYLOPRIM) 300 MG tablet Take 300 mg by mouth daily.      Marland Kitchen aspirin 81 MG tablet Take 81 mg by mouth daily.      . clobetasol cream (TEMOVATE) 0.05 %       . fish oil-omega-3 fatty acids 1000 MG capsule Take 1 g by mouth daily.      . metoprolol tartrate (LOPRESSOR) 25 MG tablet Take 1 tablet (25 mg total) by mouth 2 (two) times daily.  60 tablet  1  . mupirocin ointment (BACTROBAN) 2 %       . niacin 500 MG tablet Take 500 mg by mouth daily with breakfast.      . omeprazole (PRILOSEC) 20 MG capsule Take 20 mg by mouth daily.      . simvastatin (ZOCOR) 40 MG tablet Take 1 tablet (40 mg total) by mouth every morning.  30 tablet  2  . triamterene-hydrochlorothiazide (MAXZIDE-25) 37.5-25 MG per tablet Take 1 tablet by mouth daily.       No current facility-administered medications for this visit.    Past Medical History  Diagnosis Date  . 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 Hyperlipidemia   . GERD (gastroesophageal reflux disease)   . Skin lesion of face     hx of  . Degenerative disc disease, lumbar   . Bursitis of right shoulder   . 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 Seasonal allergies   . Erectile dysfunction     H/O ED while taking a beta-blocker    . Obesity   . Hiatal hernia   . Nocturia   . Epigastric pain     Myoview 11/09/12 Showed evidence of reversible ischemia in both the inferior wall & the anteroapical distribution.   . Hypertension     Systemic HTN. Pt has H/O of ED when taking a beta-blocker. ECHO 11/26/08 Showed no evidence of any significant valvular disease, Estimated EF = 50-60%.  . Atypical chest pain     Myoview for this on 11/09/12 Showed evidence of reversible ischemia in both the inferior wall & the anteroapical distribution.  . Coronary artery disease     CABG x 4 12/12/12 using left internal mammary artery & right endovein harvest. (LIMA-LAD; VG-OM1, OM2, VG-PD). Myoview 11/09/12 Showed evidence of reversible ischemia in both the inferior wall & the anteroapical distribution. ECHO 11/26/08 Showed no evidence of any significant valvular disease, Estimated EF = 50-60%.  . significant CAD requiring CABG 12/15/2012  . Coronary atherosclerosis     Nonobstructive coronary atherosclerosis by cath in 2011.  . Kidney stone   . CRF (chronic renal failure)     Strong family history as well. CABG x 4 using left internal mammary artery and right endovein  harvest. (LIMA-LAD; VG-OM1, OM2, VG-PD)  . Atrial fibrillation, rapid     With high ventricular rates. On Sotalol  . PAF (paroxysmal atrial fibrillation)     Post CABG. Amia intol. On Sotalol.  . Mononeuritis of unspecified site   . NSVT (nonsustained ventricular tachycardia)   . Gout     Past Surgical History  Procedure Laterality Date  . Shoulder surgery      for effects of polio  . Cardiac catheterization      12/08/2012  . Eye surgery      had lasik surgery bilaterally 1996  . Upper gi endoscopy    . Colonoscopy    . Coronary artery bypass graft  12/12/2012    CABG x 4 using left internal mammary artery and right endovein harvest. (LIMA-LAD; VG-OM1, OM2, VG-PD)    Family History  Problem Relation Age of Onset  . CAD Father   . Hypertension Father   . AAA  (abdominal aortic aneurysm) Father   . Aneurysm Father     Cause of death  . Heart attack Paternal Grandfather   . Cancer Mother     Colon. Cause of death    History   Social History  . Marital Status: Married    Spouse Name: N/A    Number of Children: 3  . Years of Education: N/A   Occupational History  . 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 Smoking status: Never Smoker   . Smokeless tobacco: Not on file  . Alcohol Use: Yes     Comment: social, very little amount, tequila  . Drug Use: No  . Sexual Activity: Not on file   Other Topics Concern  . Not on file   Social History Narrative  . No narrative on file    Review of systems: The patient specifically denies any chest pain at rest or with exertion, dyspnea at rest or with exertion, orthopnea, paroxysmal nocturnal dyspnea, syncope, palpitations, focal neurological deficits, intermittent claudication, lower extremity edema, unexplained weight gain, cough, hemoptysis or wheezing.  The patient also denies abdominal pain, nausea, vomiting, dysphagia, diarrhea, constipation, polyuria, polydipsia, dysuria, hematuria,abnormal bleeding or bruising, fever, chills, unexpected weight changes, mood swings, change in skin or hair texture, change in voice quality, auditory or visual problems, allergic reactions or rashes, new musculoskeletal complaints other than usual "aches and pains".   PHYSICAL EXAM BP 120/88  Pulse 66  Resp 14  Ht 5\' 9"  (1.753 m)  Wt 199 lb 3.2 oz (90.357 kg)  BMI 29.4 kg/m2  General: Alert, oriented x3, no distress Head: no evidence of trauma, PERRL, EOMI, no exophtalmos or lid lag, no myxedema, no xanthelasma; normal ears, nose and oropharynx Neck: normal jugular venous pulsations and no hepatojugular reflux; brisk carotid pulses without delay and no carotid bruits Chest: clear to auscultation, no signs of consolidation by percussion or palpation, normal fremitus,  symmetrical and full respiratory excursions; healed sternotomy scar Cardiovascular: normal position and quality of the apical impulse, regular rhythm, normal first and second heart sounds, no murmurs, rubs or gallops Abdomen: no tenderness or distention, no masses by palpation, no abnormal pulsatility or arterial bruits, normal bowel sounds, no hepatosplenomegaly Extremities: hypotrophic left upper extremity, no clubbing, cyanosis or edema; 2+ radial, ulnar and brachial pulses bilaterally; 2+ right femoral, posterior tibial and dorsalis pedis pulses; 2+ left femoral, posterior tibial and dorsalis pedis pulses; no subclavian or femoral bruits Neurological: grossly nonfocal   EKG: Sinus rhythm, nonspecific Q wave  in lead III only  Lipid Panel     Component Value Date/Time   CHOL 136 09/06/2013 0934   TRIG 102 09/06/2013 0934   HDL 47 09/06/2013 0934   CHOLHDL 2.9 09/06/2013 0934   VLDL 20 09/06/2013 0934   LDLCALC 69 09/06/2013 0934    BMET    Component Value Date/Time   NA 140 09/06/2013 0934   K 4.2 09/06/2013 0934   CL 102 09/06/2013 0934   CO2 30 09/06/2013 0934   GLUCOSE 101* 09/06/2013 0934   BUN 25* 09/06/2013 0934   CREATININE 1.46* 09/06/2013 0934   CREATININE 1.37* 12/21/2012 0512   CALCIUM 9.7 09/06/2013 0934   GFRNONAA 52* 12/21/2012 0512   GFRAA 61* 12/21/2012 0512     ASSESSMENT AND PLAN S/P CABG x 4, 12/12/12, LIMA-LAD;VG-OM1,OM2;VG-PD He is asymptomatic. No evidence of coronary insufficiency. Excellent activity level, but have recommended he work on his diverticula some weight.   PAF, post CABG. Amio intol, Sotolol added This appears to have been strictly a postoperative event and has not recurred. He is therefore not on warfarin anticoagulation or antiarrhythmic therapy  HYPERTENSION Good control  HYPERLIPIDEMIA All values are within the desirable range. HDL cholesterol slightly low. Recommend increased physical activity and weight loss. Target weight roughly 170  pounds, waistline 34 inches.  Orders Placed This Encounter  Procedures  . EKG 12-Lead   No orders of the defined types were placed in this encounter.    Junious Silk, MD, [facility] [phone] office [phone] pager

## 2013-09-11 NOTE — Assessment & Plan Note (Signed)
This appears to have been strictly a postoperative event and has not recurred. He is therefore not on warfarin anticoagulation or antiarrhythmic therapy

## 2013-10-18 ENCOUNTER — Other Ambulatory Visit: Payer: Self-pay | Admitting: *Deleted

## 2013-10-18 MED ORDER — SIMVASTATIN 40 MG PO TABS: 40.0000 mg | ORAL_TABLET | Freq: Every morning | ORAL | Status: DC

## 2014-01-02 ENCOUNTER — Other Ambulatory Visit: Payer: Self-pay | Admitting: *Deleted

## 2014-01-02 MED ORDER — TRIAMTERENE-HCTZ 37.5-25 MG PO TABS: 1.0000 | ORAL_TABLET | Freq: Every day | ORAL | Status: DC

## 2014-01-02 MED ORDER — METOPROLOL TARTRATE 25 MG PO TABS: 25.0000 mg | ORAL_TABLET | Freq: Two times a day (BID) | ORAL | Status: DC

## 2014-03-08 ENCOUNTER — Other Ambulatory Visit: Payer: Self-pay | Admitting: Cardiovascular Disease

## 2014-08-27 ENCOUNTER — Telehealth: Payer: Self-pay | Admitting: *Deleted

## 2014-08-27 DIAGNOSIS — R5383 Other fatigue: Secondary | ICD-10-CM

## 2014-08-27 DIAGNOSIS — Z79899 Other long term (current) drug therapy: Secondary | ICD-10-CM

## 2014-08-27 DIAGNOSIS — E785 Hyperlipidemia, unspecified: Secondary | ICD-10-CM

## 2014-08-27 DIAGNOSIS — R5381 Other malaise: Secondary | ICD-10-CM

## 2014-08-27 NOTE — Telephone Encounter (Signed)
Patient walk-in requesting lad slip for blood work prior to hs appt in October, lab slip with CMP, CBC, TSH and Fasting lipids was given to patient.

## 2014-09-26 LAB — LIPID PANEL
Cholesterol: 128 mg/dL (ref 0–200)
HDL: 46 mg/dL (ref >39)
LDL Cholesterol: 53 mg/dL (ref 0–99)
Total CHOL/HDL Ratio: 2.8 Ratio
Triglycerides: 146 mg/dL (ref <150)
VLDL: 29 mg/dL (ref 0–40)

## 2014-09-26 LAB — TSH: TSH: 2.744 u[IU]/mL (ref 0.350–4.500)

## 2014-09-26 LAB — CBC
HCT: 42.2 % (ref 39.0–52.0)
Hemoglobin: 14.7 g/dL (ref 13.0–17.0)
MCH: 29.9 pg (ref 26.0–34.0)
MCHC: 34.8 g/dL (ref 30.0–36.0)
MCV: 85.8 fL (ref 78.0–100.0)
Platelets: 196 10*3/uL (ref 150–400)
RBC: 4.92 MIL/uL (ref 4.22–5.81)
RDW: 14.1 % (ref 11.5–15.5)
WBC: 6.6 10*3/uL (ref 4.0–10.5)

## 2014-09-26 LAB — COMPREHENSIVE METABOLIC PANEL
ALT: 17 U/L (ref 0–53)
AST: 20 U/L (ref 0–37)
Albumin: 3.9 g/dL (ref 3.5–5.2)
Alkaline Phosphatase: 80 U/L (ref 39–117)
BUN: 22 mg/dL (ref 6–23)
CO2: 28 mEq/L (ref 19–32)
Calcium: 9 mg/dL (ref 8.4–10.5)
Chloride: 101 mEq/L (ref 96–112)
Creat: 1.25 mg/dL (ref 0.50–1.35)
Glucose, Bld: 83 mg/dL (ref 70–99)
Potassium: 4.1 mEq/L (ref 3.5–5.3)
Sodium: 140 mEq/L (ref 135–145)
Total Bilirubin: 0.6 mg/dL (ref 0.2–1.2)
Total Protein: 6.3 g/dL (ref 6.0–8.3)

## 2014-10-09 ENCOUNTER — Ambulatory Visit (INDEPENDENT_AMBULATORY_CARE_PROVIDER_SITE_OTHER): Payer: Medicare Other | Admitting: Cardiovascular Disease

## 2014-10-09 ENCOUNTER — Encounter: Payer: Self-pay | Admitting: Cardiovascular Disease

## 2014-10-09 VITALS — BP 138/92 | HR 57 | Resp 16 | Ht 69.0 in | Wt 205.6 lb

## 2014-10-09 DIAGNOSIS — Z951 Presence of aortocoronary bypass graft: Secondary | ICD-10-CM

## 2014-10-09 DIAGNOSIS — I480 Paroxysmal atrial fibrillation: Secondary | ICD-10-CM

## 2014-10-09 DIAGNOSIS — E785 Hyperlipidemia, unspecified: Secondary | ICD-10-CM

## 2014-10-09 DIAGNOSIS — I2510 Atherosclerotic heart disease of native coronary artery without angina pectoris: Secondary | ICD-10-CM

## 2014-10-09 DIAGNOSIS — I10 Essential (primary) hypertension: Secondary | ICD-10-CM

## 2014-10-09 NOTE — Progress Notes (Signed)
Patient ID: Hunter [name], male   DOB: 1946-11-12, 68 y.o.   MRN: [id number]     Reason for office visit CAD status post CABG, hyperlipidemia, hypertension  Dom's feeling great. He continues to play golf regularly and has no difficulty with shortness of breath or chest pain. His recent lipid profile is excellent. His blood pressure is well-controlled. Unfortunately he has gained back 6 pounds since his last appointment and is again in the mildly obese category.  He developed exertional angina (epigastric discomfort) in 2013 and subsequently underwent four-vessel bypass surgery in December 2013 (LIMA to LAD, SVG to OM1 sequential to OM2 and SVG to PDA). He had postoperative atrial fibrillation that has not recurred since (intolerance to amiodarone). He has normal left ventricular systolic function. He has a history of hyperuricemia and gout without recent exacerbation. 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  Allergies  Allergen Reactions  . Amiodarone     fasciculations    Current Outpatient Prescriptions  Medication Sig Dispense Refill  . allopurinol (ZYLOPRIM) 300 MG tablet Take 300 mg by mouth daily.      Marland Kitchen aspirin 81 MG tablet Take 81 mg by mouth daily.      . clobetasol cream (TEMOVATE) 0.05 %       . fish oil-omega-3 fatty acids 1000 MG capsule Take 1 g by mouth daily.      . metoprolol tartrate (LOPRESSOR) 25 MG tablet Take 1 tablet (25 mg total) by mouth 2 (two) times daily.  60 tablet  6  . mupirocin ointment (BACTROBAN) 2 %       . niacin 500 MG tablet Take 500 mg by mouth daily with breakfast.      . omeprazole (PRILOSEC) 20 MG capsule Take 20 mg by mouth daily.      . simvastatin (ZOCOR) 40 MG tablet Take 1 tablet (40 mg total) by mouth every morning.  30 tablet  11  . triamterene-hydrochlorothiazide (MAXZIDE-25) 37.5-25 MG per tablet Take 1 tablet by mouth daily.  30 tablet  6   No current facility-administered medications for  this visit.    Past Medical History  Diagnosis Date  . Polio     Age 31. 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 Hyperlipidemia   . GERD (gastroesophageal reflux disease)   . Skin lesion of face     hx of  . Degenerative disc disease, lumbar   . Bursitis of right shoulder   . 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 Seasonal allergies   . Erectile dysfunction     H/O ED while taking a beta-blocker  . Obesity   . Hiatal hernia   . Nocturia   . Epigastric pain     Myoview 11/09/12 Showed evidence of reversible ischemia in both the inferior wall & the anteroapical distribution.   . Hypertension     Systemic HTN. Pt has H/O of ED when taking a beta-blocker. ECHO 11/26/08 Showed no evidence of any significant valvular disease, Estimated EF = 50-60%.  . Atypical chest pain     Myoview for this on 11/09/12 Showed evidence of reversible ischemia in both the inferior wall & the anteroapical distribution.  . Coronary artery disease     CABG x 4 12/12/12 using left internal mammary artery & right endovein harvest. (LIMA-LAD; VG-OM1, OM2, VG-PD). Myoview 11/09/12 Showed evidence of reversible ischemia in both the inferior wall & the anteroapical distribution. ECHO 11/26/08 Showed no  evidence of any significant valvular disease, Estimated EF = 50-60%.  . significant CAD requiring CABG 12/15/2012  . Coronary atherosclerosis     Nonobstructive coronary atherosclerosis by cath in 2011.  . Kidney stone   . CRF (chronic renal failure)     Strong family history as well. CABG x 4 using left internal mammary artery and right endovein harvest. (LIMA-LAD; VG-OM1, OM2, VG-PD)  . Atrial fibrillation, rapid     With high ventricular rates. On Sotalol  . PAF (paroxysmal atrial fibrillation)     Post CABG. Amia intol. On Sotalol.  . Mononeuritis of unspecified site   . NSVT (nonsustained ventricular tachycardia)   . Gout     Past Surgical History  Procedure Laterality  Date  . Shoulder surgery      for effects of polio  . Cardiac catheterization      12/08/2012  . Eye surgery      had lasik surgery bilaterally 1996  . Upper gi endoscopy    . Colonoscopy    . Coronary artery bypass graft  12/12/2012    CABG x 4 using left internal mammary artery and right endovein harvest. (LIMA-LAD; VG-OM1, OM2, VG-PD)    Family History  Problem Relation Age of Onset  . CAD Father   . Hypertension Father   . AAA (abdominal aortic aneurysm) Father   . Aneurysm Father     Cause of death  . Heart attack Paternal Grandfather   . Cancer Mother     Colon. Cause of death    History   Social History  . Marital Status: Married    Spouse Name: N/A    Number of Children: 3  . Years of Education: N/A   Occupational History  . 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 Smoking status: Never Smoker   . Smokeless tobacco: Not on file  . Alcohol Use: Yes     Comment: social, very little amount, tequila  . Drug Use: No  . Sexual Activity: Not on file   Other Topics Concern  . Not on file   Social History Narrative  . No narrative on file    Review of systems: The patient specifically denies any chest pain at rest or with exertion, dyspnea at rest or with exertion, orthopnea, paroxysmal nocturnal dyspnea, syncope, palpitations, focal neurological deficits, intermittent claudication, lower extremity edema, unexplained weight gain, cough, hemoptysis or wheezing.  The patient also denies abdominal pain, nausea, vomiting, dysphagia, diarrhea, constipation, polyuria, polydipsia, dysuria, hematuria, frequency, urgency, abnormal bleeding or bruising, fever, chills, unexpected weight changes, mood swings, change in skin or hair texture, change in voice quality, auditory or visual problems, allergic reactions or rashes, new musculoskeletal complaints other than usual "aches and pains".   PHYSICAL EXAM BP 138/92  Pulse 57  Resp 16  Ht  5\' 9"  (1.753 m)  Wt 93.26 kg (205 lb 9.6 oz)  BMI 30.35 kg/m2 General: Alert, oriented x3, no distress  Head: no evidence of trauma, PERRL, EOMI, no exophtalmos or lid lag, no myxedema, no xanthelasma; normal ears, nose and oropharynx  Neck: normal jugular venous pulsations and no hepatojugular reflux; brisk carotid pulses without delay and no carotid bruits  Chest: clear to auscultation, no signs of consolidation by percussion or palpation, normal fremitus, symmetrical and full respiratory excursions; healed sternotomy scar  Cardiovascular: normal position and quality of the apical impulse, regular rhythm, normal first and second heart sounds, no murmurs, rubs or gallops  Abdomen:  no tenderness or distention, no masses by palpation, no abnormal pulsatility or arterial bruits, normal bowel sounds, no hepatosplenomegaly  Extremities: hypotrophic left upper extremity, no clubbing, cyanosis or edema; 2+ radial, ulnar and brachial pulses bilaterally; 2+ right femoral, posterior tibial and dorsalis pedis pulses; 2+ left femoral, posterior tibial and dorsalis pedis pulses; no subclavian or femoral bruits  Neurological: 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   EKG: Mild sinus bradycardia, otherwise normal  Lipid Panel     Component Value Date/Time   CHOL 128 09/26/2014 0921   TRIG 146 09/26/2014 0921   HDL 46 09/26/2014 0921   CHOLHDL 2.8 09/26/2014 0921   VLDL 29 09/26/2014 0921   LDLCALC 53 09/26/2014 0921   LDLDIRECT 83.9 10/26/2010 1056    BMET    Component Value Date/Time   NA 140 09/26/2014 0921   K 4.1 09/26/2014 0921   CL 101 09/26/2014 0921   CO2 28 09/26/2014 0921   GLUCOSE 83 09/26/2014 0921   BUN 22 09/26/2014 0921   CREATININE 1.25 09/26/2014 0921   CREATININE 1.37* 12/21/2012 0512   CALCIUM 9.0 09/26/2014 0921   GFRNONAA 52* 12/21/2012 0512   GFRAA 61* 12/21/2012 0512     ASSESSMENT AND PLAN S/P CABG x 4, 12/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 No evidence of coronary insufficiency. Excellent activity level, but have recommended he work on losing weight.  HYPERTENSION  Good control  HYPERLIPIDEMIA  All values are within the desirable range. Recommend increased hydrated intake to assist weight loss. Target weight roughly 170 pounds, waistline 34 inches.   Holli Humbles, MD, [place] [phone] office [phone] pager

## 2014-10-09 NOTE — Patient Instructions (Signed)
Dr. Croitoru recommends that you schedule a follow-up appointment in: ONE YEAR   

## 2014-10-24 ENCOUNTER — Other Ambulatory Visit: Payer: Self-pay | Admitting: *Deleted

## 2014-10-24 MED ORDER — SIMVASTATIN 40 MG PO TABS: 40.0000 mg | ORAL_TABLET | Freq: Every morning | ORAL | Status: DC

## 2014-10-24 NOTE — Telephone Encounter (Signed)
Refilled with adjustment to 90 day supply

## 2014-12-05 ENCOUNTER — Encounter (HOSPITAL_COMMUNITY): Payer: Self-pay | Admitting: Cardiovascular Disease

## 2014-12-27 HISTORY — PX: SKIN CANCER EXCISION: SHX779

## 2015-04-15 ENCOUNTER — Other Ambulatory Visit: Payer: Self-pay | Admitting: Cardiovascular Disease

## 2015-04-15 NOTE — Telephone Encounter (Signed)
Rx refill sent to patient pharmacy   

## 2015-05-15 ENCOUNTER — Ambulatory Visit (INDEPENDENT_AMBULATORY_CARE_PROVIDER_SITE_OTHER): Payer: Medicare Other | Admitting: Cardiovascular Disease

## 2015-05-15 ENCOUNTER — Encounter: Payer: Self-pay | Admitting: Cardiovascular Disease

## 2015-05-15 VITALS — BP 130/86 | HR 71 | Ht 69.0 in | Wt 206.0 lb

## 2015-05-15 DIAGNOSIS — I2510 Atherosclerotic heart disease of native coronary artery without angina pectoris: Secondary | ICD-10-CM

## 2015-05-15 DIAGNOSIS — R079 Chest pain, unspecified: Secondary | ICD-10-CM

## 2015-05-15 NOTE — Progress Notes (Signed)
Patient ID: Hunter [name], male   DOB: 1946/12/16, 69 y.o.   MRN: [id number]      Cardiology Office Note   Date:  05/16/2015   ID:  Hunter [name], DOB Mar 12, 1946, MRN [id number]  PCP:  Kerry Hough, MD  Cardiologist:   Sanda Klein, MD   Chief Complaint  Patient presents with  . Follow-up    Chest pain. Left side feeling heavy.      History of Present Illness: Hunter [name] is a 69 y.o. male who presents for coronary artery disease status post CABG in 2013 and the recent episode of chest heaviness. He was playing golf the other day when he noticed a heavy sensation across the upper half of his anterior chest moving from left to right. It did not appear to be clearly related to the intensity of physical activity and he was able to finish his golf game. He has had continued sensation of light chest pressure ever since. The pattern is very different from his angina before bypass surgery, when his discomfort was mostly in the epigastrium and right upper quadrant. He has not had dyspnea, diaphoresis, palpitations, dizziness or syncope.  His electrocardiogram today does not show any repolarization abnormalities but does show an unusually tall R wave in lead V2, slightly more prominent than on previous tracings. He has questionable inferior Q waves, not much change from older tracings.  He developed exertional angina (epigastric discomfort) in 2013 and subsequently underwent four-vessel bypass surgery in December 2013 (LIMA to LAD, SVG to OM1 sequential to OM2 and SVG to PDA). He had postoperative atrial fibrillation that has not recurred since (intolerance to amiodarone). He has normal left ventricular systolic function. He has a history of hyperuricemia and gout without recent exacerbation. 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   Past Medical History  Diagnosis Date  . Polio     Age 14. 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  Hyperlipidemia   . GERD (gastroesophageal reflux disease)   . Skin lesion of face     hx of  . Degenerative disc disease, lumbar   . Bursitis of right shoulder   . 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 Seasonal allergies   . Erectile dysfunction     H/O ED while taking a beta-blocker  . Obesity   . Hiatal hernia   . Nocturia   . Epigastric pain     Myoview 11/09/12 Showed evidence of reversible ischemia in both the inferior wall & the anteroapical distribution.   . Hypertension     Systemic HTN. Pt has H/O of ED when taking a beta-blocker. ECHO 11/26/08 Showed no evidence of any significant valvular disease, Estimated EF = 50-60%.  . Atypical chest pain     Myoview for this on 11/09/12 Showed evidence of reversible ischemia in both the inferior wall & the anteroapical distribution.  . Coronary artery disease     CABG x 4 12/12/12 using left internal mammary artery & right endovein harvest. (LIMA-LAD; VG-OM1, OM2, VG-PD). Myoview 11/09/12 Showed evidence of reversible ischemia in both the inferior wall & the anteroapical distribution. ECHO 11/26/08 Showed no evidence of any significant valvular disease, Estimated EF = 50-60%.  . significant CAD requiring CABG 12/15/2012  . Coronary atherosclerosis     Nonobstructive coronary atherosclerosis by cath in 2011.  . Kidney stone   . CRF (chronic renal failure)     Strong family history as well. CABG x  4 using left internal mammary artery and right endovein harvest. (LIMA-LAD; VG-OM1, OM2, VG-PD)  . Atrial fibrillation, rapid     With high ventricular rates. On Sotalol  . PAF (paroxysmal atrial fibrillation)     Post CABG. Amia intol. On Sotalol.  . Mononeuritis of unspecified site   . NSVT (nonsustained ventricular tachycardia)   . Gout     Past Surgical History  Procedure Laterality Date  . Shoulder surgery      for effects of polio  . Cardiac catheterization      12/08/2012  . Eye surgery      had lasik  surgery bilaterally 1996  . Upper gi endoscopy    . Colonoscopy    . Coronary artery bypass graft  12/12/2012    CABG x 4 using left internal mammary artery and right endovein harvest. (LIMA-LAD; VG-OM1, OM2, VG-PD)  . Left heart catheterization with coronary angiogram N/A 12/08/2012    Procedure: LEFT HEART CATHETERIZATION WITH CORONARY ANGIOGRAM;  Surgeon: Sanda Klein, MD;  Location: [facility];  Service: Cardiovascular;  Laterality: N/A;     Current Outpatient Prescriptions  Medication Sig Dispense Refill  . allopurinol (ZYLOPRIM) 300 MG tablet Take 300 mg by mouth daily.    Marland Kitchen aspirin 81 MG tablet Take 81 mg by mouth daily.    . clobetasol cream (TEMOVATE) 0.05 %     . fish oil-omega-3 fatty acids 1000 MG capsule Take 1 g by mouth daily.    . metoprolol tartrate (LOPRESSOR) 25 MG tablet Take 1 tablet (25 mg total) by mouth 2 (two) times daily. 60 tablet 6  . mupirocin ointment (BACTROBAN) 2 %     . niacin 500 MG tablet Take 500 mg by mouth daily with breakfast.    . omeprazole (PRILOSEC) 20 MG capsule Take 20 mg by mouth daily.    . simvastatin (ZOCOR) 40 MG tablet Take 1 tablet (40 mg total) by mouth every morning. 90 tablet 3  . triamterene-hydrochlorothiazide (MAXZIDE-25) 37.5-25 MG per tablet TAKE ONE TABLET BY MOUTH EVERY DAY 30 tablet 5   No current facility-administered medications for this visit.    Allergies:   Amiodarone    Social History:  The patient  reports that he has never smoked. He does not have any smokeless tobacco history on file. He reports that he drinks alcohol. He reports that he does not use illicit drugs.   Family History:  The patient's family history includes AAA (abdominal aortic aneurysm) in his father; Aneurysm in his father; CAD in his father; Cancer in his mother; Heart attack in his paternal grandfather; Hypertension in his father.    ROS:  Please see the history of present illness.    Otherwise, review of systems positive for none.   All  other systems are reviewed and negative.    PHYSICAL EXAM: VS:  BP 130/86 mmHg  Pulse 71  Ht 5\' 9"  (1.753 m)  Wt 93.441 kg (206 lb)  BMI 30.41 kg/m2 , BMI Body mass index is 30.41 kg/(m^2).  General: Alert, oriented x3, no distress Head: no evidence of trauma, PERRL, EOMI, no exophtalmos or lid lag, no myxedema, no xanthelasma; normal ears, nose and oropharynx Neck: normal jugular venous pulsations and no hepatojugular reflux; brisk carotid pulses without delay and no carotid bruits Chest: clear to auscultation, no signs of consolidation by percussion or palpation, normal fremitus, symmetrical and full respiratory excursions, sternotomy scar Cardiovascular: normal position and quality of the apical impulse, regular rhythm, normal first  and second heart sounds, no murmurs, rubs or gallops Abdomen: no tenderness or distention, no masses by palpation, no abnormal pulsatility or arterial bruits, normal bowel sounds, no hepatosplenomegaly Extremities: no clubbing, cyanosis or edema; 2+ radial, ulnar and brachial pulses bilaterally; 2+ right femoral, posterior tibial and dorsalis pedis pulses; 2+ left femoral, posterior tibial and dorsalis pedis pulses; no subclavian or femoral bruits Neurological: grossly nonfocal Psych: euthymic mood, full affect   EKG:  EKG is ordered today. The ekg ordered today demonstrates sinus rhythm, tall R wave in lead V2, slender Q waves in leads 3 and aVF, no repolarization abnormalities   Recent Labs: 09/26/2014: ALT 17; BUN 22; Creatinine 1.25; Hemoglobin 14.7; Platelets 196; Potassium 4.1; Sodium 140; TSH 2.744    Lipid Panel    Component Value Date/Time   CHOL 128 09/26/2014 0921   TRIG 146 09/26/2014 0921   HDL 46 09/26/2014 0921   CHOLHDL 2.8 09/26/2014 0921   VLDL 29 09/26/2014 0921   LDLCALC 53 09/26/2014 0921   LDLDIRECT 83.9 10/26/2010 1056      Wt Readings from Last 3 Encounters:  05/15/15 93.441 kg (206 lb)  10/09/14 93.26 kg (205 lb 9.6  oz)  09/11/13 90.357 kg (199 lb 3.2 oz)     ASSESSMENT AND PLAN:  1. Chest discomfort - this has been going on now for several days without any new symptoms or signs of active ischemia on ECG. It is also different from his previous angina. I don't think there is enough support for an acute coronary syndrome, but I recommended he have a nuclear perfusion study.  2. CAD s/p CABG in 2013. He had postoperative atrial fibrillation without recurrence in the last 3 years, no longer on anticoagulant or antiarrhythmic therapy.  3. Hyperlipidemia with excellent lipid profile checked a few months ago (LDL 53). Even his HDL had improved substantially from historical levels  4. Mild obesity - he has gained back just about all the weight that he lost following his bypass surgery and he is encouraged to pay more attention to his diet and exercise. Target BMI 25 or less, weight under 170 pounds, waist line 34 inches.   Current medicines are reviewed at length with the patient today.  The patient does not have concerns regarding medicines.  The following changes have been made:  no change  Labs/ tests ordered today include:  Orders Placed This Encounter  Procedures  . Exercise Tolerance Test  . EKG 12-Lead    Patient Instructions  Your physician wants you to follow-up in: 1 Year You will receive a reminder letter in the mail two months in advance. If you don't receive a letter, please call our office to schedule the follow-up appointment.  Your physician has requested that you have an exercise tolerance test. For further information please visit [email or web address]. Please also follow instruction sheet, as given.      Mikael Spray, MD  05/16/2015 12:14 PM    Sanda Klein, MD, [facility] [phone] office [phone] pager

## 2015-05-15 NOTE — Patient Instructions (Signed)
Your physician wants you to follow-up in: 1 Year. You will receive a reminder letter in the mail two months in advance. If you don't receive a letter, please call our office to schedule the follow-up appointment.  Your physician has requested that you have an exercise tolerance test. For further information please visit [email or web address]. Please also follow instruction sheet, as given.    

## 2015-05-16 ENCOUNTER — Telehealth: Payer: Self-pay | Admitting: *Deleted

## 2015-05-16 DIAGNOSIS — R079 Chest pain, unspecified: Secondary | ICD-10-CM

## 2015-05-16 NOTE — Telephone Encounter (Signed)
-----   Message from Sanda Klein, MD sent at 05/16/2015 12:20 PM EDT ----- Noticed exercise tolerance test was listed in his orders but he should have a treadmill Myoview. Please schedule a nuclear test

## 2015-05-22 ENCOUNTER — Other Ambulatory Visit: Payer: Self-pay | Admitting: Cardiovascular Disease

## 2015-05-22 NOTE — Telephone Encounter (Signed)
Rx(s) sent to pharmacy electronically.  

## 2015-06-11 ENCOUNTER — Telehealth (HOSPITAL_COMMUNITY): Payer: Self-pay

## 2015-06-11 NOTE — Telephone Encounter (Signed)
Encounter complete. 

## 2015-06-13 ENCOUNTER — Ambulatory Visit (HOSPITAL_COMMUNITY)
Admission: RE | Admit: 2015-06-13 | Discharge: 2015-06-13 | Disposition: A | Discharge status: Home / Self Care | Payer: Medicare Other | Source: Ambulatory Visit | Attending: Cardiovascular Disease | Admitting: Cardiovascular Disease

## 2015-06-13 DIAGNOSIS — I2510 Atherosclerotic heart disease of native coronary artery without angina pectoris: Secondary | ICD-10-CM | POA: Diagnosis not present

## 2015-06-13 DIAGNOSIS — R079 Chest pain, unspecified: Secondary | ICD-10-CM | POA: Insufficient documentation

## 2015-06-13 LAB — EXERCISE TOLERANCE TEST
Estimated workload: 7.8 METS
Exercise duration (min): 6 min
Exercise duration (sec): 30 s
MPHR: 152 {beats}/min
Peak HR: 164 {beats}/min
Percent HR: 107 %
RPE: 14
Rest HR: 103 {beats}/min

## 2015-07-04 ENCOUNTER — Encounter: Payer: Self-pay | Admitting: *Deleted

## 2015-07-22 ENCOUNTER — Encounter: Payer: Self-pay | Admitting: Cardiovascular Disease

## 2015-08-04 DIAGNOSIS — C439 Malignant melanoma of skin, unspecified: Secondary | ICD-10-CM

## 2015-08-04 HISTORY — DX: Malignant melanoma of skin, unspecified: C43.9

## 2015-12-08 ENCOUNTER — Other Ambulatory Visit: Payer: Self-pay | Admitting: Cardiovascular Disease

## 2016-03-19 ENCOUNTER — Other Ambulatory Visit: Payer: Self-pay | Admitting: Cardiovascular Disease

## 2016-03-19 MED ORDER — TRIAMTERENE-HCTZ 37.5-25 MG PO TABS: 1.0000 | ORAL_TABLET | Freq: Every day | ORAL | Status: DC

## 2016-03-23 ENCOUNTER — Telehealth: Payer: Self-pay | Admitting: Cardiovascular Disease

## 2016-03-23 NOTE — Telephone Encounter (Signed)
New message       Calling to get clarifications on triamterene-HCTZ 37-5-25mg .  Pt is taking 1/2 tablet daily

## 2016-03-23 NOTE — Telephone Encounter (Signed)
Clarification given on medication dose as prescribed.

## 2016-06-22 ENCOUNTER — Other Ambulatory Visit: Payer: Self-pay | Admitting: Cardiovascular Disease

## 2016-06-22 NOTE — Telephone Encounter (Signed)
Rx request sent to pharmacy.  

## 2016-07-28 ENCOUNTER — Other Ambulatory Visit: Payer: Self-pay | Admitting: Cardiovascular Disease

## 2016-07-28 NOTE — Telephone Encounter (Signed)
Rx(s) sent to pharmacy electronically.  

## 2016-08-09 ENCOUNTER — Other Ambulatory Visit: Payer: Self-pay | Admitting: Cardiovascular Disease

## 2016-08-23 ENCOUNTER — Ambulatory Visit (INDEPENDENT_AMBULATORY_CARE_PROVIDER_SITE_OTHER): Payer: Medicare Other | Admitting: Cardiovascular Disease

## 2016-08-23 ENCOUNTER — Encounter: Payer: Self-pay | Admitting: Cardiovascular Disease

## 2016-08-23 VITALS — BP 110/74 | HR 64 | Ht 69.0 in | Wt 195.0 lb

## 2016-08-23 DIAGNOSIS — I10 Essential (primary) hypertension: Secondary | ICD-10-CM | POA: Diagnosis not present

## 2016-08-23 DIAGNOSIS — I4891 Unspecified atrial fibrillation: Secondary | ICD-10-CM

## 2016-08-23 DIAGNOSIS — E785 Hyperlipidemia, unspecified: Secondary | ICD-10-CM

## 2016-08-23 DIAGNOSIS — I9789 Other postprocedural complications and disorders of the circulatory system, not elsewhere classified: Secondary | ICD-10-CM | POA: Diagnosis not present

## 2016-08-23 DIAGNOSIS — I2510 Atherosclerotic heart disease of native coronary artery without angina pectoris: Secondary | ICD-10-CM

## 2016-08-23 MED ORDER — SIMVASTATIN 40 MG PO TABS: 40.0000 mg | ORAL_TABLET | Freq: Every day | ORAL | 3 refills | Status: DC

## 2016-08-23 MED ORDER — TRIAMTERENE-HCTZ 37.5-25 MG PO TABS
1.0000 | ORAL_TABLET | ORAL | 3 refills | Status: DC
Start: 2016-08-24 — End: 2017-08-25

## 2016-08-23 MED ORDER — METOPROLOL TARTRATE 25 MG PO TABS: 25.0000 mg | ORAL_TABLET | Freq: Two times a day (BID) | ORAL | 3 refills | Status: DC

## 2016-08-23 NOTE — Progress Notes (Signed)
Cardiology Office Note    Date:  08/23/2016   ID:  Hunter [name], DOB [birth year], MRN [id number]  PCP:  Kerry Hough, MD  Cardiologist:   Sanda Klein, MD   Chief Complaint  Patient presents with  . Follow-up    patient reports no complaints    History of Present Illness:  Hunter [name] is a 70 y.o. male with coronary artery disease who underwent bypass surgery in 0000000, complicated by a transitory postoperative atrial fibrillation. He has hypertension and hyperlipidemia. He has lost 11 pounds since last year and has a lower blood pressure. He remains physically active and plays golf regularly. He denies any problems with angina or dyspnea with activity, edema, palpitations, focal neurological complaints, leg edema, claudication, syncope. He had a normal plain treadmill stress test in June 2016, exercising for 6 minutes and 30 seconds.   Past Medical History:  Diagnosis Date  . Atrial fibrillation, rapid ([place])    With high ventricular rates. On Sotalol  . Atypical chest pain    Myoview for this on 11/09/12 Showed evidence of reversible ischemia in both the inferior wall & the anteroapical distribution.  . Bursitis of right shoulder   . Coronary artery disease    CABG x 4 12/12/12 using left internal mammary artery & right endovein harvest. (LIMA-LAD; VG-OM1, OM2, VG-PD). Myoview 11/09/12 Showed evidence of reversible ischemia in both the inferior wall & the anteroapical distribution. ECHO 11/26/08 Showed no evidence of any significant valvular disease, Estimated EF = 50-60%.  . Coronary atherosclerosis    Nonobstructive coronary atherosclerosis by cath in 2011.  Marland Kitchen CRF (chronic renal failure)    Strong family history as well. CABG x 4 using left internal mammary artery and right endovein harvest. (LIMA-LAD; VG-OM1, OM2, VG-PD)  . Degenerative disc disease, lumbar   . Epigastric pain    Myoview 11/09/12 Showed evidence of reversible ischemia in both the inferior wall &  the anteroapical distribution.   . Erectile dysfunction    H/O ED while taking a beta-blocker  . GERD (gastroesophageal reflux disease)   . Gout   . Hiatal hernia   . Hyperlipidemia   . Hypertension    Systemic HTN. Pt has H/O of ED when taking a beta-blocker. ECHO 11/26/08 Showed no evidence of any significant valvular disease, Estimated EF = 50-60%.  . Kidney stone   . Mononeuritis of unspecified site   . Nocturia   . NSVT (nonsustained ventricular tachycardia) ([place])   . Obesity   . PAF (paroxysmal atrial fibrillation) (H[facility])    Post CABG. Amia intol. On Sotalol.  . Polio    Age 89. 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 Seasonal allergies   . significant CAD requiring CABG 12/15/2012  . Skin lesion of face    hx of    Past Surgical History:  Procedure Laterality Date  . CARDIAC CATHETERIZATION     12/08/2012  . COLONOSCOPY    . CORONARY ARTERY BYPASS GRAFT  12/12/2012   CABG x 4 using left internal mammary artery and right endovein harvest. (LIMA-LAD; VG-OM1, OM2, VG-PD)  . EYE SURGERY     had lasik surgery bilaterally 1996  . LEFT HEART CATHETERIZATION WITH CORONARY ANGIOGRAM N/A 12/08/2012   Procedure: LEFT HEART CATHETERIZATION WITH CORONARY ANGIOGRAM;  Surgeon: Sanda Klein, MD;  Location: [facility];  Service: Cardiovascular;  Laterality: N/A;  . SHOULDER SURGERY     for effects of polio  .  UPPER GI ENDOSCOPY      Current Medications: Outpatient Medications Prior to Visit  Medication Sig Dispense Refill  . allopurinol (ZYLOPRIM) 300 MG tablet Take 300 mg by mouth daily.    Marland Kitchen aspirin 81 MG tablet Take 81 mg by mouth daily.    . clobetasol cream (TEMOVATE) 0.05 %     . fish oil-omega-3 fatty acids 1000 MG capsule Take 1 g by mouth daily.    . mupirocin ointment (BACTROBAN) 2 %     . omeprazole (PRILOSEC) 20 MG capsule Take 20 mg by mouth daily.    . metoprolol tartrate (LOPRESSOR) 25 MG  tablet TAKE ONE TABLET BY MOUTH TWO TIMES A DAY. NEED APPOINTMENT FOR FUTURE REFILLS 30 tablet 0  . niacin 500 MG tablet Take 500 mg by mouth daily with breakfast.    . simvastatin (ZOCOR) 40 MG tablet TAKE 1 TABLET (40 MG TOTAL) BY MOUTH EVERY MORNING. 90 tablet 2  . triamterene-hydrochlorothiazide (MAXZIDE-25) 37.5-25 MG tablet Take 1 tablet by mouth daily. 30 tablet 2   No facility-administered medications prior to visit.      Allergies:   Amiodarone and Oxycodone   Social History   Social History  . Marital status: Married    Spouse name: N/A  . Number of children: 3  . Years of education: N/A   Occupational History  . 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 Smoking status: Never Smoker  . Smokeless tobacco: None  . Alcohol use Yes     Comment: social, very little amount, tequila  . Drug use: No  . Sexual activity: Not Asked   Other Topics Concern  . None   Social History Narrative  . None     Family History:  The patient's family history includes AAA (abdominal aortic aneurysm) in his father; Aneurysm in his father; CAD in his father; Cancer in his mother; Heart attack in his paternal grandfather; Hypertension in his father.   ROS:   Please see the history of present illness.    ROS All other systems reviewed and are negative.   PHYSICAL EXAM:   VS:  BP 110/74   Pulse 64   Ht 5\' 9"  (1.753 m)   Wt 195 lb (88.5 kg)   SpO2 97%   BMI 28.80 kg/m    GEN: Well nourished, well developed, in no acute distress  HEENT: normal  Neck: no JVD, carotid bruits, or masses Cardiac: RRR; no murmurs, rubs, or gallops,no edema  Respiratory:  clear to auscultation bilaterally, normal work of breathing GI: soft, nontender, nondistended, + BS MS: no deformity or atrophy  Skin: warm and dry, no rash Neuro:  Alert and Oriented x 3, Strength and sensation are intact Except chronic left upper extremity paresis related to childhood polio. Psych:  euthymic mood, full affect  Wt Readings from Last 3 Encounters:  08/23/16 195 lb (88.5 kg)  05/15/15 206 lb (93.4 kg)  10/09/14 205 lb 9.6 oz (93.3 kg)      Studies/Labs Reviewed:   EKG:  EKG is ordered today.  The ekg ordered today demonstrates NSR, QTc 435 ms  Recent Labs: No results found for requested labs within last 8760 hours.   Lipid Panel    Component Value Date/Time   CHOL 128 09/26/2014 0921   TRIG 146 09/26/2014 0921   HDL 46 09/26/2014 0921   CHOLHDL 2.8 09/26/2014 0921   VLDL 29 09/26/2014 0921   LDLCALC 53 09/26/2014 [id number]  LDLDIRECT 83.9 10/26/2010 1056      ASSESSMENT:    1. Coronary artery disease involving native coronary artery of native heart without angina pectoris   2. Postoperative atrial fibrillation (HCC)   3. Essential hypertension   4. Hyperlipidemia      PLAN:  In order of problems listed above:  1. CAD s/p CABG: Asymptomatic with a very active lifestyle. 2. No recurrence since the immediate postop period. Off antiarrhythmics and anticoagulation. 3. HTN: Excellent control and substantial weight loss. Will decrease the diuretic to 4 days a week. A lower dose of hydrochlorothiazide might help reduce the likelihood of recurrent gout. 4. HLP: Little benefit to added niacin. Discontinue niacin, but continue the statin. Make sure he takes the simvastatin with the evening meal or at bedtime rather than in the morning. Recheck lipids with his primary care physician in October. They might be an option to decrease the simvastatin dose as long as we keep LDL under 70 mg/dL.    Medication Adjustments/Labs and Tests Ordered: Current medicines are reviewed at length with the patient today.  Concerns regarding medicines are outlined above.  Medication changes, Labs and Tests ordered today are listed in the Patient Instructions below. Patient Instructions  Dr Sallyanne Kuster has recommended making the following medication changes: 1. STOP Niacin 2. DECREASE  Triamterene-Hydrochlorothiazide to 4 times weekly - Tuesdays, Thursdays, Saturdays, and Sundays  Dr C recommends that you schedule a follow-up appointment in 12 months. You will receive a reminder letter in the mail two months in advance. If you don't receive a letter, please call our office to schedule the follow-up appointment.  If you need a refill on your cardiac medications before your next appointment, please call your pharmacy.    Signed, Sanda Klein, MD  08/23/2016 10:39 AM    [facility] [address], [place], [place]  [address] Phone: [phone]; Fax: [phone]

## 2016-08-23 NOTE — Patient Instructions (Signed)
Dr Sallyanne Kuster has recommended making the following medication changes: 1. STOP Niacin 2. DECREASE Triamterene-Hydrochlorothiazide to 4 times weekly - Tuesdays, Thursdays, Saturdays, and Sundays  Dr C recommends that you schedule a follow-up appointment in 12 months. You will receive a reminder letter in the mail two months in advance. If you don't receive a letter, please call our office to schedule the follow-up appointment.  If you need a refill on your cardiac medications before your next appointment, please call your pharmacy.

## 2017-07-03 ENCOUNTER — Other Ambulatory Visit: Payer: Self-pay | Admitting: Cardiovascular Disease

## 2017-08-17 ENCOUNTER — Other Ambulatory Visit: Payer: Self-pay | Admitting: Cardiovascular Disease

## 2017-08-25 ENCOUNTER — Ambulatory Visit (INDEPENDENT_AMBULATORY_CARE_PROVIDER_SITE_OTHER): Payer: Medicare Other | Admitting: Physician Assistant

## 2017-08-25 ENCOUNTER — Encounter: Payer: Self-pay | Admitting: Physician Assistant

## 2017-08-25 VITALS — BP 134/76 | HR 52 | Ht 69.0 in | Wt 194.0 lb

## 2017-08-25 DIAGNOSIS — R5383 Other fatigue: Secondary | ICD-10-CM | POA: Diagnosis not present

## 2017-08-25 DIAGNOSIS — I2510 Atherosclerotic heart disease of native coronary artery without angina pectoris: Secondary | ICD-10-CM

## 2017-08-25 DIAGNOSIS — I10 Essential (primary) hypertension: Secondary | ICD-10-CM

## 2017-08-25 MED ORDER — CARVEDILOL 6.25 MG PO TABS: 6.2500 mg | ORAL_TABLET | Freq: Two times a day (BID) | ORAL | 11 refills | Status: DC

## 2017-08-25 NOTE — Progress Notes (Signed)
Thanks, Suanne Marker, I agree. MCr

## 2017-08-25 NOTE — Patient Instructions (Signed)
Medication Instructions:  STOP METOPROLOL START COREG 6.25 TWICE DAILY If you need a refill on your cardiac medications before your next appointment, please call your pharmacy.  Labwork: HAVE LABS W/ FASTING LIPID PANEL WITH pPCP AND PLEASE RESULTS TO [phone]  Follow-Up: Your physician wants you to follow-up in: Hunter [name] should receive a reminder letter in the mail two months in advance. If you do not receive a letter, please call our office June 2019 to schedule the AUGUST 2019 follow-up appointment.  Thank you for choosing [facility] at [facility]!!

## 2017-08-25 NOTE — Progress Notes (Signed)
Cardiology Office Note   Date:  08/25/2017   ID:  Chanceler, Pullin [birth year], MRN [id number]  PCP:  Kerry Hough, MD  Cardiologist:  Dr. Sallyanne Kuster, 08/23/2016  Jerrol Helmers, Suanne Marker, PA-C    History of Present Illness: Hunter [name] is a 71 y.o. male with a history of CABG 2013 with postop A. fib on sotalol, HTN, HLD, CKD III, DJD, GERD  Hunter [name] presents for cardiology follow up.   He fees tired and wonders if it is the medication. His daughter is an NP, she wonders if Bystolic would be a better option. He has looked into the Bystolic and it would cost him $175 a month.  He feels overly emotional at times. Does not understand why.   He is working part-time, this is cutting into his golf. He played in a golf tournament recently, did poorly. He plans to play more golf as his schedule allows.  He does not get chest pain or shortness of breath when he plays golf. He has not been having palpitations and does not think he has had any A. fib. He denies lower extremity edema, orthopnea, or PND. He is successfully keeping his weight down and is pleased about this. He is taking a half tablet of the diuretic every day and is doing well with this.   Past Medical History:  Diagnosis Date  . 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 Intol amio, was on Sotalol, now on Lopressor  . Bursitis of right shoulder   . CAD (coronary artery disease) 11/2012   CABG x 4 using left internal mammary artery and right endovein harvest. (LIMA-LAD; VG-OM1, OM2, VG-PD), EF 55-60% at cath  . Degenerative disc disease, lumbar   . Epigastric pain    Myoview 11/09/12 Showed evidence of reversible ischemia in both the inferior wall & the anteroapical distribution.   . Erectile dysfunction    H/O ED while taking a beta-blocker  . GERD (gastroesophageal reflux disease)   . Gout   . Hiatal hernia   . Hyperlipidemia   . Hypertension    Systemic HTN. Pt has H/O of ED when  taking a beta-blocker. ECHO 11/26/08 Showed no evidence of any significant valvular disease, Estimated EF = 50-60%.  . Kidney stone   . Mononeuritis of unspecified site   . Nocturia   . NSVT (nonsustained ventricular tachycardia) ([place])   . Obesity   . PAF (paroxysmal atrial fibrillation) ([facility])    Post CABG. Amia intol. Was on Sotalol, now on Lopressor  . 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 Seasonal allergies   . significant CAD requiring CABG 12/15/2012  . Skin lesion of face    hx of    Past Surgical History:  Procedure Laterality Date  . CARDIAC CATHETERIZATION     12/08/2012  . COLONOSCOPY    . CORONARY ARTERY BYPASS GRAFT  12/12/2012   CABG x 4 using left internal mammary artery and right endovein harvest. (LIMA-LAD; VG-OM1, OM2, VG-PD)  . EYE SURGERY     had lasik surgery bilaterally 1996  . LEFT HEART CATHETERIZATION WITH CORONARY ANGIOGRAM N/A 12/08/2012   Procedure: LEFT HEART CATHETERIZATION WITH CORONARY ANGIOGRAM;  Surgeon: Sanda Klein, MD;  Location: [facility]ATH [facility];  Service: Cardiovascular;  Laterality: N/A;  . SHOULDER SURGERY     for effects of polio  . UPPER GI ENDOSCOPY  Current Outpatient Prescriptions  Medication Sig Dispense Refill  . allopurinol (ZYLOPRIM) 300 MG tablet Take 300 mg by mouth daily.    Marland Kitchen aspirin 81 MG tablet Take 81 mg by mouth daily.    . clobetasol cream (TEMOVATE) 2.77 % Apply 1 application topically as needed.     . fish oil-omega-3 fatty acids 1000 MG capsule Take 1 g by mouth daily.    . metoprolol tartrate (LOPRESSOR) 25 MG tablet TAKE 1 TABLET BY MOUTH TWICE A DAY 180 tablet 0  . mupirocin ointment (BACTROBAN) 2 %     . omeprazole (PRILOSEC) 20 MG capsule Take 20 mg by mouth daily.    . simvastatin (ZOCOR) 40 MG tablet TAKE 1 TABLET BY MOUTH EVERY MORNING 90 tablet 1  . triamterene-hydrochlorothiazide (MAXZIDE-25) 37.5-25 MG tablet  Take 0.5 tablets by mouth every morning.    . carvedilol (COREG) 6.25 MG tablet Take 1 tablet (6.25 mg total) by mouth 2 (two) times daily with a meal. 60 tablet 11   No current facility-administered medications for this visit.     Allergies:   Amiodarone and Oxycodone    Social History:  The patient  reports that he has never smoked. He has never used smokeless tobacco. He reports that he drinks alcohol. He reports that he does not use drugs.   Family History:  The patient's family history includes AAA (abdominal aortic aneurysm) in his father; Aneurysm in his father; CAD in his father; Cancer in his mother; Heart attack in his paternal grandfather; Hypertension in his father.    ROS:  Please see the history of present illness. All other systems are reviewed and negative.    PHYSICAL EXAM: VS:  BP 134/76   Pulse (!) 52   Ht 5\' 9"  (1.753 m)   Wt 194 lb (88 kg)   BMI 28.65 kg/m  , BMI Body mass index is 28.65 kg/m. 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 The ekg ordered today demonstrates sinus bradycardia, heart rate 52, no acute ischemic changes. Hunter [name] is asymptomatic with a heart rate in the 54s.   Recent Labs: No results found for requested labs within last 8760 hours.    Lipid Panel    Component Value Date/Time   CHOL 128 09/26/2014 0921   TRIG 146 09/26/2014 0921   HDL 46 09/26/2014 0921   CHOLHDL 2.8 09/26/2014 0921   VLDL 29  09/26/2014 0921   LDLCALC 53 09/26/2014 0921   LDLDIRECT 83.9 10/26/2010 1056     Wt Readings from Last 3 Encounters:  08/25/17 194 lb (88 kg)  08/23/16 195 lb (88.5 kg)  05/15/15 206 lb (93.4 kg)     Other studies Reviewed: Additional studies/ records that were reviewed today include: Office notes, hospital records and testing.  ASSESSMENT AND PLAN:  1.  CAD: He is on baby aspirin, metoprolol, and Zocor. His heart muscle function was normal when checked at the time of his bypass surgery.  He is not having any ischemic symptoms and no further testing is indicated at this time. He is encouraged to maintain a good activity level.   2. Fatigue: His heart rate is a little on the low side and he is having some problems with fatigue. Changes metoprolol to Coreg 6.25 mg twice a day. This is an equivalent dose or a little less. If he continues to experience fatigue, we can decrease this.  3. Hyperlipidemia: This is followed by his PCP. I explained that his goal LDL is 70. If he can keep his LDL below 70 on a lower dose statin that is okay.  4. Hypertension: The carvedilol may do a little bit more for his blood pressure then the metoprolol is doing. His volume status is good by exam so no change in his diuretic.   Current medicines are reviewed at length with the patient today.  The patient has concerns regarding medicines. Concerns were addressed  The following changes have been made:  Discontinue metoprolol, start carvedilol  Labs/ tests ordered today include:   Orders Placed This Encounter  Procedures  . EKG 12-Lead     Disposition:   FU with Dr. Sallyanne Kuster in one year  Signed, Lenoard Aden  08/25/2017 12:59 PM    [place] Phone: [phone]; Fax: [phone]  This note was written with the assistance of speech recognition software. Please excuse any transcriptional errors.

## 2017-09-16 ENCOUNTER — Other Ambulatory Visit: Payer: Self-pay

## 2017-09-16 MED ORDER — CARVEDILOL 6.25 MG PO TABS: 6.2500 mg | ORAL_TABLET | Freq: Two times a day (BID) | ORAL | 11 refills | Status: DC

## 2018-01-13 ENCOUNTER — Other Ambulatory Visit: Payer: Self-pay | Admitting: *Deleted

## 2018-01-13 MED ORDER — SIMVASTATIN 40 MG PO TABS: 40.0000 mg | ORAL_TABLET | Freq: Every morning | ORAL | 2 refills | Status: DC

## 2018-02-07 ENCOUNTER — Other Ambulatory Visit: Payer: Self-pay | Admitting: Cardiovascular Disease

## 2018-02-09 NOTE — Telephone Encounter (Signed)
Rx(s) sent to pharmacy electronically.  

## 2018-08-05 ENCOUNTER — Other Ambulatory Visit: Payer: Self-pay | Admitting: Cardiovascular Disease

## 2018-08-16 ENCOUNTER — Other Ambulatory Visit: Payer: Self-pay | Admitting: Physician Assistant

## 2018-09-13 ENCOUNTER — Other Ambulatory Visit: Payer: Self-pay | Admitting: Cardiovascular Disease

## 2018-10-03 ENCOUNTER — Other Ambulatory Visit: Payer: Self-pay | Admitting: Cardiovascular Disease

## 2018-12-02 ENCOUNTER — Other Ambulatory Visit: Payer: Self-pay | Admitting: Cardiovascular Disease

## 2018-12-05 ENCOUNTER — Other Ambulatory Visit: Payer: Self-pay | Admitting: Cardiovascular Disease

## 2018-12-31 ENCOUNTER — Other Ambulatory Visit: Payer: Self-pay | Admitting: Cardiovascular Disease

## 2019-02-04 ENCOUNTER — Encounter (HOSPITAL_COMMUNITY): Payer: Self-pay | Admitting: Emergency Medicine

## 2019-02-04 ENCOUNTER — Other Ambulatory Visit: Payer: Self-pay

## 2019-02-04 ENCOUNTER — Emergency Department (HOSPITAL_COMMUNITY): Payer: Medicare Other

## 2019-02-04 ENCOUNTER — Emergency Department (HOSPITAL_COMMUNITY)
Admission: EM | Admit: 2019-02-04 | Discharge: 2019-02-04 | Disposition: A | Discharge status: Home / Self Care | Payer: Medicare Other | Attending: Emergency Medicine | Admitting: Emergency Medicine

## 2019-02-04 DIAGNOSIS — I2510 Atherosclerotic heart disease of native coronary artery without angina pectoris: Secondary | ICD-10-CM | POA: Insufficient documentation

## 2019-02-04 DIAGNOSIS — Z7982 Long term (current) use of aspirin: Secondary | ICD-10-CM | POA: Diagnosis not present

## 2019-02-04 DIAGNOSIS — J101 Influenza due to other identified influenza virus with other respiratory manifestations: Secondary | ICD-10-CM | POA: Diagnosis not present

## 2019-02-04 DIAGNOSIS — I10 Essential (primary) hypertension: Secondary | ICD-10-CM | POA: Insufficient documentation

## 2019-02-04 DIAGNOSIS — Z951 Presence of aortocoronary bypass graft: Secondary | ICD-10-CM | POA: Insufficient documentation

## 2019-02-04 DIAGNOSIS — R05 Cough: Secondary | ICD-10-CM | POA: Diagnosis present

## 2019-02-04 DIAGNOSIS — Z79899 Other long term (current) drug therapy: Secondary | ICD-10-CM | POA: Insufficient documentation

## 2019-02-04 NOTE — ED Triage Notes (Signed)
No flu shot this year  Diagnosed with flu B weds   Given Xofluze    Per wife he is still sick and she has brought here for weakness and fear that he may be "going into pneumonia"

## 2019-02-04 NOTE — ED Provider Notes (Signed)
[facility] EMERGENCY DEPARTMENT Provider Note   CSN: [id number] Arrival date & time: 02/04/19  1407     History   Chief Complaint Chief Complaint  Patient presents with  . flu like    HPI Hunter [name] is a 73 y.o. male.  Patient diagnosed with influenza first part of February.  Came down with symptoms January 31.  Start an antiviral about 5 days after onset of symptoms.  Patient states he is improved after the first week but still does not feel completely back to normal he was worried he had pneumonia.  Patient without any fevers says he still has some cough not really necessarily all that productive just generalized weakness and fatigue.  No nausea vomiting or diarrhea.     Past Medical History:  Diagnosis Date  . 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 Intol amio, was on Sotalol, now on Lopressor  . Bursitis of right shoulder   . CAD (coronary artery disease) 11/2012   CABG x 4 using left internal mammary artery and right endovein harvest. (LIMA-LAD; VG-OM1, OM2, VG-PD), EF 55-60% at cath  . Degenerative disc disease, lumbar   . Epigastric pain    Myoview 11/09/12 Showed evidence of reversible ischemia in both the inferior wall & the anteroapical distribution.   . Erectile dysfunction    H/O ED while taking a beta-blocker  . GERD (gastroesophageal reflux disease)   . Gout   . Hiatal hernia   . Hyperlipidemia   . Hypertension    Systemic HTN. Pt has H/O of ED when taking a beta-blocker. ECHO 11/26/08 Showed no evidence of any significant valvular disease, Estimated EF = 50-60%.  . Kidney stone   . Mononeuritis of unspecified site   . Nocturia   . NSVT (nonsustained ventricular tachycardia) ([place])   . Obesity   . PAF (paroxysmal atrial fibrillation) ([facility])    Post CABG. Amia intol. Was on Sotalol, now on Lopressor  . Polio    Age 87. 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 Seasonal allergies   . significant CAD requiring CABG 12/15/2012  . Skin lesion of face    hx of    Patient Active Problem List   Diagnosis Date Noted  . Chronic anticoagulation, Xarelto 12/20/2012  . NSVT (nonsustained ventricular tachycardia), 9 beats 12/23 12/18/2012  . Abnormal nuclear cardiac imaging test as an OP 12/16/2012  . Polio 12/16/2012  . Angina effort 12/15/2012  . CAD s/p CABG 12/15/2012  . 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 Postoperative atrial fibrillation ([place]) 12/15/2012  . Hyperlipidemia 10/26/2010  . GOUT 10/26/2010  . MONONEURITIS OF UNSPECIFIED SITE 10/26/2010  . Essential hypertension 10/26/2010  . GERD 10/26/2010  . NEPHROLITHIASIS, HX OF 10/26/2010    Past Surgical History:  Procedure Laterality Date  . CARDIAC CATHETERIZATION     12/08/2012  . COLONOSCOPY    . CORONARY ARTERY BYPASS GRAFT  12/12/2012   CABG x 4 using left internal mammary artery and right endovein harvest. (LIMA-LAD; VG-OM1, OM2, VG-PD)  . EYE SURGERY     had lasik surgery bilaterally 1996  . LEFT HEART CATHETERIZATION WITH CORONARY ANGIOGRAM N/A 12/08/2012   Procedure: LEFT HEART CATHETERIZATION WITH CORONARY ANGIOGRAM;  Surgeon: Sanda Klein, MD;  Location: [facility] CATH LAB;  Service: Cardiovascular;  Laterality: N/A;  . SHOULDER SURGERY     for effects of polio  . UPPER GI ENDOSCOPY  Home Medications    Prior to Admission medications   Medication Sig Start Date End Date Taking? Authorizing Provider  allopurinol (ZYLOPRIM) 300 MG tablet Take 300 mg by mouth daily.   Yes [provider]  aspirin 81 MG tablet Take 81 mg by mouth daily.   Yes [provider]  clobetasol cream (TEMOVATE) 7.09 % Apply 1 application topically as needed.  12/06/12  Yes [provider]  fish oil-omega-3 fatty acids 1000 MG capsule Take 1 g by mouth daily.   Yes [provider]  metoprolol tartrate (LOPRESSOR) 25 MG tablet  Take 25 mg by mouth 2 (two) times daily.   Yes [provider]  mupirocin ointment (BACTROBAN) 2 %  12/11/12  Yes [provider]  omeprazole (PRILOSEC) 20 MG capsule Take 20 mg by mouth daily.   Yes [provider]  simvastatin (ZOCOR) 40 MG tablet TAKE 1 TABLET BY MOUTH EVERY DAY IN THE MORNING 01/03/19  Yes Croitoru, Mihai, MD  triamterene-hydrochlorothiazide (MAXZIDE-25) 37.5-25 MG tablet TAKE 1 TABLET BY MOUTH 4 TIMES A WEEK ON TUESDAY, THURS, SATURDAY AND SUNDAY 12/04/18  Yes Croitoru, Mihai, MD  carvedilol (COREG) 6.25 MG tablet Take 1 tablet (6.25 mg total) by mouth 2 (two) times daily with a meal. 09/16/17   Croitoru, Mihai, MD  carvedilol (COREG) 6.25 MG tablet TAKE 1 TABLET BY MOUTH TWICE A DAY WITH MEALS **NEED TO MAKE APPT 12/05/18   Croitoru, Dani Gobble, MD    Family History Family History  Problem Relation Age of Onset  . CAD Father   . Hypertension Father   . AAA (abdominal aortic aneurysm) Father   . Aneurysm Father        Cause of death  . Heart attack Paternal Grandfather   . Cancer Mother        Colon. Cause of death    Social History Social History   Tobacco Use  . Smoking status: Never Smoker  . Smokeless tobacco: Never Used  Substance Use Topics  . Alcohol use: Yes    Comment: social, very little amount, tequila  . Drug use: No     Allergies   Amiodarone and Oxycodone   Review of Systems Review of Systems  Constitutional: Positive for fatigue. Negative for chills and fever.  HENT: Positive for congestion. Negative for rhinorrhea and sore throat.   Eyes: Negative for visual disturbance.  Respiratory: Positive for cough. Negative for shortness of breath.   Cardiovascular: Negative for chest pain and leg swelling.  Gastrointestinal: Negative for abdominal pain, diarrhea, nausea and vomiting.  Genitourinary: Negative for dysuria.  Musculoskeletal: Negative for back pain and neck pain.  Skin: Negative for rash.  Neurological:  Positive for weakness. Negative for dizziness, light-headedness and headaches.  Hematological: Does not bruise/bleed easily.  Psychiatric/Behavioral: Negative for confusion.     Physical Exam Updated Vital Signs BP (S) (!) 144/97 (BP Location: Right Arm)   Pulse 88   Temp (!) 97.5 F (36.4 C) (Oral)   Resp 18   Ht 1.753 m (5\' 9" )   Wt 85.3 kg   SpO2 97%   BMI 27.76 kg/m   Physical Exam Vitals signs and nursing note reviewed.  Constitutional:      General: He is not in acute distress.    Appearance: Normal appearance. He is well-developed.  HENT:     Head: Normocephalic and atraumatic.     Nose: Congestion present.     Mouth/Throat:     Comments: Mucous membranes slightly dry  but there is some moisture. Eyes:     Extraocular Movements: Extraocular movements intact.     Conjunctiva/sclera: Conjunctivae normal.     Pupils: Pupils are equal, round, and reactive to light.  Neck:     Musculoskeletal: Neck supple.  Cardiovascular:     Rate and Rhythm: Normal rate and regular rhythm.     Heart sounds: No murmur.  Pulmonary:     Effort: Pulmonary effort is normal. No respiratory distress.     Breath sounds: Normal breath sounds.  Abdominal:     General: Bowel sounds are normal.     Palpations: Abdomen is soft.     Tenderness: There is no abdominal tenderness.  Musculoskeletal: Normal range of motion.  Skin:    General: Skin is warm and dry.     Capillary Refill: Capillary refill takes less than 2 seconds.  Neurological:     General: No focal deficit present.     Mental Status: He is alert and oriented to person, place, and time.     Cranial Nerves: No cranial nerve deficit.     Sensory: No sensory deficit.     Motor: No weakness.      ED Treatments / Results  Labs (all labs ordered are listed, but only abnormal results are displayed) Labs Reviewed - No data to display  EKG None  Radiology Dg Chest 2 View  Result Date: 02/04/2019 CLINICAL DATA:  Dx of flu on  Wed, no improvement, pt concerned for pneumonia EXAM: CHEST - 2 VIEW COMPARISON:  01/09/2013 FINDINGS: Median sternotomy and CABG. Heart is normal in size. There are no focal consolidations or pleural effusions. There is perihilar peribronchial thickening and mild prominence of interstitial markings. IMPRESSION: 1. Bronchitic changes. 2. No focal acute pulmonary abnormality. Electronically Signed   By: Nolon Nations M.D.   On: 02/04/2019 14:59    Procedures Procedures (including critical care time)  Medications Ordered in ED Medications - No data to display   Initial Impression / Assessment and Plan / ED Course  I have reviewed the triage vital signs and the nursing notes.  Pertinent labs & imaging results that were available during my care of the patient were reviewed by me and considered in my medical decision making (see chart for details).    Patient with onset of symptoms consistent with influenza on January 31.  Started on antiviral 5 days after the onset of symptoms.  Patient overall better but not completely better he was concerned that maybe had pneumonia.  Chest x-ray here today without evidence of pneumonia.  Patient without a significant cough has a little bit of cough seems to have a little bit of bronchitis.  Clinically looked very well mucous membranes slightly dry but were moist.  Did offer IV fluids and basic labs check but told the family that it was not critical I felt that he was doing clinically well enough and they chose just to go home and gave him directions on how to hydrate well and how viruses tend to dehydrate you.  And to return for development of any fevers or any worse symptoms like when he first got sick.    Final Clinical Impressions(s) / ED Diagnoses   Final diagnoses:  Influenza B    ED Discharge Orders    None       Fredia Sorrow, MD 02/04/19 [phone]

## 2019-02-04 NOTE — Discharge Instructions (Addendum)
As we discussed is important to hydrate yourself well.  Small amounts of fluid frequently.  Something with sugar will help with the calories.  Return for the development of fever or significantly worse symptoms.  Today's chest x-ray negative for pneumonia.  Would recommend staying out of work for another week.

## 2019-04-01 ENCOUNTER — Other Ambulatory Visit: Payer: Self-pay | Admitting: Cardiovascular Disease

## 2019-04-02 NOTE — Telephone Encounter (Signed)
Simvastatin refilled. 

## 2019-08-11 ENCOUNTER — Other Ambulatory Visit: Payer: Self-pay | Admitting: Cardiovascular Disease

## 2019-08-14 NOTE — Progress Notes (Signed)
Cardiology Office Note:    Date:  08/15/2019   ID:  Hunter [name], DOB 09-05-1946, MRN [id number]  PCP:  [name] E  Cardiologist:  Sanda Klein, MD   Referring MD: Thea Alken, FNP   Follow-up for his coronary artery disease, hypertension, and hyperlipidemia.  History of Present Illness:    Hunter [name] is a 73 y.o. male with a hx of CAD s/p CABG x4 (LIMA-LAD; VG-OM1, OM2, VG-PD, 1324), complicated by post-op Afib (previously on amio, sotalol, now on lopressor), HTN, HLD, CKD stage III, and GERD.  He had a normal EF a the time of his CABG. He was last seen in clinic 08/25/17 with Rosaria Ferries PAC. He was doing well at that time, but complained of fatigue. Lopressor changed to coreg 6.25 mg BID.   He presents to the clinic today and states he feels well and  started golfing again on July 1 (rides cart).  He remarried in 2000 and has moved to the [place], [place] area where he lives and plays golf.  He states he is  active around his house and push mows his lawn each week.  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  He follows a heart healthy diet but states he enjoys cured meats like ham in moderation.  Finally, he states he sees his PCP annually in the fall.  We will request his labs from his last visit.  He denies chest pain, shortness of breath, lower extremity edema, fatigue, palpitations, melena, hematuria, hemoptysis, diaphoresis, weakness, presyncope, syncope, orthopnea, and PND.  Past Medical History:  Diagnosis Date  . Atrial fibrillation, rapid ([place])    With high ventricular rates. Intol amio, was on Sotalol, now on Lopressor  . Bursitis of right shoulder   . CAD (coronary artery disease) 11/2012   CABG x 4 using left internal mammary artery and right endovein harvest. (LIMA-LAD; VG-OM1, OM2, VG-PD), EF 55-60% at cath  . Degenerative disc disease, lumbar   . Epigastric pain    Myoview 11/09/12 Showed evidence  of reversible ischemia in both the inferior wall & the anteroapical distribution.   . Erectile dysfunction    H/O ED while taking a beta-blocker  . GERD (gastroesophageal reflux disease)   . Gout   . Hiatal hernia   . Hyperlipidemia   . Hypertension    Systemic HTN. Pt has H/O of ED when taking a beta-blocker. ECHO 11/26/08 Showed no evidence of any significant valvular disease, Estimated EF = 50-60%.  . Kidney stone   . Mononeuritis of unspecified site   . Nocturia   . NSVT (nonsustained ventricular tachycardia) ([place])   . Obesity   . PAF (paroxysmal atrial fibrillation) ([facility])    Post CABG. Amia intol. Was on Sotalol, now on Lopressor  . 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 Seasonal allergies   . significant CAD requiring CABG 12/15/2012  . Skin lesion of face    hx of    Past Surgical History:  Procedure Laterality Date  . CARDIAC CATHETERIZATION     12/08/2012  . COLONOSCOPY    . CORONARY ARTERY BYPASS GRAFT  12/12/2012   CABG x 4 using left internal mammary artery and right endovein harvest. (LIMA-LAD; VG-OM1, OM2, VG-PD)  . EYE SURGERY     had lasik surgery bilaterally 1996  . LEFT HEART CATHETERIZATION WITH CORONARY  ANGIOGRAM N/A 12/08/2012   Procedure: LEFT HEART CATHETERIZATION WITH CORONARY ANGIOGRAM;  Surgeon: Sanda Klein, MD;  Location: [facility];  Service: Cardiovascular;  Laterality: N/A;  . SHOULDER SURGERY     for effects of polio  . UPPER GI ENDOSCOPY      Current Medications: Current Meds  Medication Sig  . allopurinol (ZYLOPRIM) 300 MG tablet Take 300 mg by mouth daily.  Marland Kitchen aspirin 81 MG tablet Take 81 mg by mouth daily.  . carvedilol (COREG) 6.25 MG tablet Take 1 tablet (6.25 mg total) by mouth 2 (two) times daily with a meal.  . clobetasol cream (TEMOVATE) 6.30 % Apply 1 application topically as needed.   . fish oil-omega-3 fatty acids 1000 MG capsule Take  1 g by mouth daily.  . mupirocin ointment (BACTROBAN) 2 %   . omeprazole (PRILOSEC) 20 MG capsule Take 20 mg by mouth daily.  . simvastatin (ZOCOR) 40 MG tablet TAKE 1 TABLET BY MOUTH EVERY DAY IN THE MORNING  . triamterene-hydrochlorothiazide (MAXZIDE-25) 37.5-25 MG tablet TAKE 1 TABLET BY MOUTH 4 TIMES A WEEK ON TUESDAY, THURS, SATURDAY AND SUNDAY     Allergies:   Amiodarone and Oxycodone   Social History   Socioeconomic History  . Marital status: Married    Spouse name: Not on file  . Number of children: 3  . Years of education: Not on file  . Highest education level: Not on file  Occupational History  . Occupation: Higher education careers adviser (RETIRED)    Employer: [employer]  Social Needs  . Financial resource strain: Not on file  . Food insecurity    Worry: Not on file    Inability: Not on file  . Transportation needs    Medical: Not on file    Non-medical: Not on file  Tobacco Use  . Smoking status: Never Smoker  . Smokeless tobacco: Never Used  Substance and Sexual Activity  . Alcohol use: Yes    Comment: social, very little amount, tequila  . Drug use: No  . Sexual activity: Not on file  Lifestyle  . Physical activity    Days per week: Not on file    Minutes per session: Not on file  . Stress: Not on file  Relationships  . Social Herbalist on phone: Not on file    Gets together: Not on file    Attends religious service: Not on file    Active member of club or organization: Not on file    Attends meetings of clubs or organizations: Not on file    Relationship status: Not on file  Other Topics Concern  . Not on file  Social History Narrative  . Not on file     Family History: The patient'sfamily history includes AAA (abdominal aortic aneurysm) in his father; Aneurysm in his father; CAD in his father; Cancer in his mother; Heart attack in his paternal grandfather; Hypertension in his father.  ROS:   Please see the history of present illness.     All  other systems reviewed and are negative.  EKGs/Labs/Other Studies Reviewed:    The following studies were reviewed today:  EKG 08/25/2017 Sinus bradycardia 52 bpm  EKG 08/23/2016 Sinus rhythm 64 bpm  Cardiac catheterization 12/08/2012  EKG:  EKG is  today.  The ekg ordered today demonstrates sinus bradycardia 57 bpm no acute changes.  Recent Labs: No results found for requested labs within last 8760 hours.  Recent Lipid Panel    Component  Value Date/Time   CHOL 128 09/26/2014 0921   TRIG 146 09/26/2014 0921   HDL 46 09/26/2014 0921   CHOLHDL 2.8 09/26/2014 0921   VLDL 29 09/26/2014 0921   LDLCALC 53 09/26/2014 0921   LDLDIRECT 83.9 10/26/2010 1056    Physical Exam:    VS:  BP 138/82   Pulse (!) 57   Ht 5\' 9"  (1.753 m)   Wt 191 lb (86.6 kg)   SpO2 98%   BMI 28.21 kg/m     Wt Readings from Last 3 Encounters:  08/15/19 191 lb (86.6 kg)  02/04/19 188 lb (85.3 kg)  08/25/17 194 lb (88 kg)     GEN:  Well nourished, well developed in no acute distress HEENT: Normal NECK: No JVD; No carotid bruits LYMPHATICS: No lymphadenopathy CARDIAC: Slow regular rhythm, no murmurs, rubs, gallops RESPIRATORY:  Clear to auscultation without rales, wheezing or rhonchi  ABDOMEN: Soft, non-tender, non-distended MUSCULOSKELETAL:  No edema; left hand/arm hypertrophy due to polio virus. SKIN: Warm and dry NEUROLOGIC:  Alert and oriented x 3 PSYCHIATRIC:  Normal affect   ASSESSMENT/PLAN:    1.  Coronary artery disease-CABG x4 (LIMA-LAD; VG-OM1, OM2, VG-PD, 2013), no chest pain today.  He continues to be physically active playing golf and doing yard work including push mowing his yard weekly. Continue 81 mg aspirin daily Continue carvedilol 6.25 mg daily Continue Maxide 37.5-25 mg half tablet daily Heart healthy low-sodium diet Increase physical activity as tolerated  2.  Hypertension- today 138/82  Continue carvedilol 6.25 mg daily Continue Maxide 37.5-25 mg half tablet daily  Heart healthy low-sodium diet Increase physical activity as tolerated Order BMP  3.  Hyperlipidemia- LDL 53 (09/26/2014) Continue simvastatin 40 mg tablet daily Continue omega-3 fatty acid 1000 mg tablet daily Heart healthy low-sodium diet Increase physical activity as tolerated Obtain lipid profile/direct LDL from PCP and CBC from PCP as well  4.  Postoperative atrial fibrillation- sinus bradycardia today.  No palpitations or irregular rhythms felt by patient. EKG and BMP today   Medication Adjustments/Labs and Tests Ordered: Current medicines are reviewed at length with the patient today.  Concerns regarding medicines are outlined above.  Orders Placed This Encounter  Procedures  . Basic metabolic panel  . EKG 12-Lead   No orders of the defined types were placed in this encounter.  Disposition: Follow-up with Dr. Sallyanne Kuster in 1 year.  Signed, Deberah Pelton, NP  08/15/2019 11:18 AM    [place]

## 2019-08-15 ENCOUNTER — Encounter: Payer: Self-pay | Admitting: Physician Assistant

## 2019-08-15 ENCOUNTER — Other Ambulatory Visit: Payer: Self-pay

## 2019-08-15 ENCOUNTER — Ambulatory Visit (INDEPENDENT_AMBULATORY_CARE_PROVIDER_SITE_OTHER): Payer: Medicare Other | Admitting: General Practice

## 2019-08-15 VITALS — BP 138/82 | HR 57 | Ht 69.0 in | Wt 191.0 lb

## 2019-08-15 DIAGNOSIS — I9789 Other postprocedural complications and disorders of the circulatory system, not elsewhere classified: Secondary | ICD-10-CM | POA: Diagnosis not present

## 2019-08-15 DIAGNOSIS — E7800 Pure hypercholesterolemia, unspecified: Secondary | ICD-10-CM

## 2019-08-15 DIAGNOSIS — I10 Essential (primary) hypertension: Secondary | ICD-10-CM | POA: Diagnosis not present

## 2019-08-15 DIAGNOSIS — I2510 Atherosclerotic heart disease of native coronary artery without angina pectoris: Secondary | ICD-10-CM | POA: Diagnosis not present

## 2019-08-15 DIAGNOSIS — I4891 Unspecified atrial fibrillation: Secondary | ICD-10-CM | POA: Diagnosis not present

## 2019-08-15 LAB — BASIC METABOLIC PANEL
BUN/Creatinine Ratio: 26 — ABNORMAL HIGH (ref 10–24)
BUN: 35 mg/dL — ABNORMAL HIGH (ref 8–27)
CO2: 23 mmol/L (ref 20–29)
Calcium: 9.4 mg/dL (ref 8.6–10.2)
Chloride: 101 mmol/L (ref 96–106)
Creatinine, Ser: 1.37 mg/dL — ABNORMAL HIGH (ref 0.76–1.27)
GFR calc Af Amer: 59 mL/min/{1.73_m2} — ABNORMAL LOW (ref >59)
GFR calc non Af Amer: 51 mL/min/{1.73_m2} — ABNORMAL LOW (ref >59)
Glucose: 91 mg/dL (ref 65–99)
Potassium: 4 mmol/L (ref 3.5–5.2)
Sodium: 141 mmol/L (ref 134–144)

## 2019-08-15 NOTE — Patient Instructions (Signed)
Medication Instructions:  Your physician recommends that you continue on your current medications as directed. Please refer to the Current Medication list given to you today.  If you need a refill on your cardiac medications before your next appointment, please call your pharmacy.   Lab work: Your physician recommends that you return for lab work today: BMET  If you have labs (blood work) drawn today and your tests are completely normal, you will receive your results only by: Marland Kitchen MyChart Message (if you have MyChart) OR . A paper copy in the mail If you have any lab test that is abnormal or we need to change your treatment, we will call you to review the results.   Follow-Up: At [facility], you and your health needs are our priority.  As part of our continuing mission to provide you with exceptional heart care, we have created designated Provider Care Teams.  These Care Teams include your primary Cardiologist (physician) and Advanced Practice Providers (APPs -  Physician Assistants and Nurse Practitioners) who all work together to provide you with the care you need, when you need it. You will need a follow up appointment in 12 months.  Please call our office 2 months in advance to schedule this appointment.  You may see Sanda Klein, MD or one of the following Advanced Practice Providers on your designated Care Team: [place], [place] . Fabian Sharp, PA-C  Any Other Special Instructions Will Be Listed Below (If Applicable). I will request your past blood work from your PCP.

## 2019-08-22 ENCOUNTER — Other Ambulatory Visit: Payer: Self-pay | Admitting: General Practice

## 2019-08-22 DIAGNOSIS — I4891 Unspecified atrial fibrillation: Secondary | ICD-10-CM

## 2019-08-22 DIAGNOSIS — I10 Essential (primary) hypertension: Secondary | ICD-10-CM

## 2019-08-22 DIAGNOSIS — I9789 Other postprocedural complications and disorders of the circulatory system, not elsewhere classified: Secondary | ICD-10-CM

## 2019-08-22 DIAGNOSIS — I2510 Atherosclerotic heart disease of native coronary artery without angina pectoris: Secondary | ICD-10-CM

## 2019-08-24 ENCOUNTER — Other Ambulatory Visit: Payer: Self-pay

## 2019-08-24 DIAGNOSIS — I10 Essential (primary) hypertension: Secondary | ICD-10-CM

## 2019-08-24 DIAGNOSIS — I9789 Other postprocedural complications and disorders of the circulatory system, not elsewhere classified: Secondary | ICD-10-CM

## 2019-08-24 DIAGNOSIS — I4891 Unspecified atrial fibrillation: Secondary | ICD-10-CM

## 2019-08-24 DIAGNOSIS — I2510 Atherosclerotic heart disease of native coronary artery without angina pectoris: Secondary | ICD-10-CM

## 2019-08-25 LAB — BASIC METABOLIC PANEL
BUN/Creatinine Ratio: 24 (ref 10–24)
BUN: 36 mg/dL — ABNORMAL HIGH (ref 8–27)
CO2: 24 mmol/L (ref 20–29)
Calcium: 9.5 mg/dL (ref 8.6–10.2)
Chloride: 102 mmol/L (ref 96–106)
Creatinine, Ser: 1.5 mg/dL — ABNORMAL HIGH (ref 0.76–1.27)
GFR calc Af Amer: 53 mL/min/{1.73_m2} — ABNORMAL LOW (ref >59)
GFR calc non Af Amer: 46 mL/min/{1.73_m2} — ABNORMAL LOW (ref >59)
Glucose: 87 mg/dL (ref 65–99)
Potassium: 3.9 mmol/L (ref 3.5–5.2)
Sodium: 142 mmol/L (ref 134–144)

## 2019-08-28 DIAGNOSIS — M1990 Unspecified osteoarthritis, unspecified site: Secondary | ICD-10-CM

## 2019-08-28 HISTORY — DX: Unspecified osteoarthritis, unspecified site: M19.90

## 2019-08-29 ENCOUNTER — Telehealth: Payer: Self-pay | Admitting: General Practice

## 2019-08-29 ENCOUNTER — Telehealth: Payer: Self-pay | Admitting: Physician Assistant

## 2019-08-29 NOTE — Telephone Encounter (Signed)
error 

## 2019-08-31 ENCOUNTER — Telehealth: Payer: Self-pay | Admitting: Cardiovascular Disease

## 2019-08-31 NOTE — Telephone Encounter (Signed)
Follow up ° ° °Patient is returning call for lab results. Please call. °

## 2019-09-04 NOTE — Progress Notes (Signed)
Please request Hunter [name] BMP labs from his PCP office so that we can review them. His BUN and Creatinine seem to be stable however, I would like to see his other labs to verify this. Thank you

## 2019-09-06 ENCOUNTER — Ambulatory Visit: Payer: Medicare Other | Admitting: Physician Assistant

## 2019-09-14 ENCOUNTER — Other Ambulatory Visit: Payer: Self-pay | Admitting: Cardiovascular Disease

## 2019-09-14 NOTE — Telephone Encounter (Signed)
New message    *STAT* If patient is at the pharmacy, call can be transferred to refill team.   1. Which medications need to be refilled? (please list name of each medication and dose if known)   triamterene-hydrochlorothiazide (MAXZIDE-25) 37.5-25 MG tablet    simvastatin (ZOCOR) 40 MG tablet  carvedilol (COREG) 6.25 MG tablet     2. Which pharmacy/location (including street and city if local pharmacy) is medication to be sent to?[id number] - [place], [address]  3. Do they need a 30 day or 90 day supply? [place]

## 2019-09-17 MED ORDER — SIMVASTATIN 40 MG PO TABS: 40.0000 mg | ORAL_TABLET | Freq: Every day | ORAL | 3 refills | Status: DC

## 2019-09-17 MED ORDER — CARVEDILOL 6.25 MG PO TABS: 6.2500 mg | ORAL_TABLET | Freq: Two times a day (BID) | ORAL | 3 refills | Status: DC

## 2019-09-17 MED ORDER — TRIAMTERENE-HCTZ 37.5-25 MG PO TABS: 1.0000 | ORAL_TABLET | ORAL | 3 refills | Status: DC

## 2019-09-17 NOTE — Telephone Encounter (Signed)
Let patient know that I refilled all the medications that he requested to be refilled.

## 2019-09-21 NOTE — Telephone Encounter (Signed)
Notes recorded by Deberah Pelton, NP on 08/27/2019 at 9:22 AM EDT  Please request Hunter [name] BMP labs from his PCP office so that we can review them. His BUN and Creatinine seem to be stable however, I would like to see his other labs to verify this. Thank you  LM2CB, sent MyChart message-sorry for the delay in calling, we have been waiting for previous labs to compare. We have been unsuccessful in getting these labs to compare

## 2019-09-21 NOTE — Telephone Encounter (Signed)
New Message ° ° °Patient returning your call. °

## 2019-09-25 NOTE — Telephone Encounter (Signed)
mychart message sent to pt with JC, NP recommendations

## 2019-11-08 ENCOUNTER — Telehealth: Payer: Self-pay | Admitting: Cardiovascular Disease

## 2019-11-08 ENCOUNTER — Other Ambulatory Visit: Payer: Self-pay | Admitting: Urology

## 2019-11-08 NOTE — Telephone Encounter (Signed)
   Primary Cardiologist: Sanda Klein, MD  Chart reviewed as part of pre-operative protocol coverage. Patient was contacted 11/08/2019 in reference to pre-operative risk assessment for pending surgery as outlined below.  Hunter [name] was last seen on 08/15/2019 by Coletta Memos, NP.  Since that day, Hunter [name] has done well from a cardiac standpoint. He is still quite active, golfing, walking, mowing his lawn, and working 2 days per week. He can easily complete 4 METs without anginal complaints.  Therefore, based on ACC/AHA guidelines, the patient would be at acceptable risk for the planned procedure without further cardiovascular testing.   Patient has a history of CAD s/p CABG in 2013 without subsequent intervention, therefore patient can hold aspirin 5 days prior to his upcoming bladder surgery. Aspirin should be restarted as soon as he is cleared to do so by Dr. Karsten Ro.  I will route this recommendation to the requesting party via Epic fax function and remove from pre-op pool.  Please call with questions.  Abigail Butts, PA-C 11/08/2019, 1:21 PM

## 2019-11-08 NOTE — Telephone Encounter (Signed)
° °  ° °  [facility] Pre-operative Risk Assessment    Request for surgical clearance:  1. What type of surgery is being performed? Trans urethral resection of bladder tumor  2. When is this surgery scheduled? 11-16-19  3. What type of clearance is required (medical clearance vs. Pharmacy clearance to hold med vs. Both)? both  4. Are there any medications that need to be held prior to surgery and how long? Aspirin 5 days  5. Practice name and name of physician performing surgery? Alphonzo Cruise, [facility]  6. What is your office phone number: [phone] x [phone]    7.   What is your office fax number: [phone]   8.   Anesthesia type (None, local, MAC, general) ? General    Johnna Acosta 11/08/2019, 1:01 PM  _________________________________________________________________   (provider comments below)

## 2019-11-13 ENCOUNTER — Other Ambulatory Visit (HOSPITAL_COMMUNITY)
Admission: RE | Admit: 2019-11-13 | Discharge: 2019-11-13 | Disposition: A | Discharge status: Home / Self Care | Payer: Medicare Other | Source: Ambulatory Visit | Attending: Urology | Admitting: Urology

## 2019-11-13 DIAGNOSIS — Z01812 Encounter for preprocedural laboratory examination: Secondary | ICD-10-CM | POA: Insufficient documentation

## 2019-11-13 DIAGNOSIS — Z20828 Contact with and (suspected) exposure to other viral communicable diseases: Secondary | ICD-10-CM | POA: Diagnosis not present

## 2019-11-13 NOTE — Patient Instructions (Addendum)
DUE TO COVID-19 ONLY ONE VISITOR IS ALLOWED TO COME WITH YOU AND STAY IN THE WAITING ROOM ONLY DURING PRE OP AND PROCEDURE DAY OF SURGERY.  THE 1 VISITOR MAY VISIT WITH YOU AFTER SURGERY IN YOUR PRIVATE ROOM DURING VISITING HOURS ONLY!   PLEASE BEGIN THE QUARANTINE INSTRUCTIONS AS OUTLINED IN YOUR HANDOUT.                Hunter [name]    Your procedure is scheduled on: 11/16/19   Report to [facility] Main  Entrance Report to admitting at  7:00 AM     Call this number if you have problems the morning of surgery [phone]    Remember: Do not eat food or drink liquids after Midnight.     BRUSH YOUR TEETH MORNING OF SURGERY AND RINSE YOUR MOUTH OUT, NO CHEWING GUM CANDY OR MINTS.     Take these medicines the morning of surgery with A SIP OF WATER: Coreg (Carvedilol), Allopurinol, Omeprizole                                 You may not have any metal on your body including piercings              Do not wear jewelry lotions, powders or  deodorant             Men may shave face and neck.   Do not bring valuables to the hospital. [place].  Contacts, dentures or bridgework may not be worn into surgery.      Patients discharged the day of surgery will not be allowed to drive home.   IF YOU ARE HAVING SURGERY AND GOING HOME THE SAME DAY, YOU MUST HAVE AN ADULT TO DRIVE YOU HOME AND BE WITH YOU FOR 24 HOURS.   YOU MAY GO HOME BY TAXI OR UBER OR ORTHERWISE, BUT AN ADULT MUST ACCOMPANY YOU HOME AND STAY WITH YOU FOR 24 HOURS.    Name and phone number of your driver:  Special Instructions: N/A              Please read over the following fact sheets you were given: _____________________________________________________________________             [facility] - Preparing for Surgery  Before surgery, you can play an important role.   Because skin is not sterile, your skin needs to be as free of germs as possible.    You can reduce the number of germs on your skin by washing with CHG (chlorahexidine gluconate) soap before surgery.   CHG is an antiseptic cleaner which kills germs and bonds with the skin to continue killing germs even after washing. Please DO NOT use if you have an allergy to CHG or antibacterial soaps.   If your skin becomes reddened/irritated stop using the CHG and inform your nurse when you arrive at Short Stay. .  You may shave your face/neck.  Please follow these instructions carefully:   1.  Shower with CHG Soap the night before surgery and the  morning of Surgery.  2.  If you choose to wash your hair, wash your hair first as usual with your  normal  shampoo.  3.  After you shampoo, rinse your hair and body thoroughly to remove the  shampoo.  4.  Use CHG as you would any other liquid soap.  You can apply chg directly  to the skin and wash                       Gently with a scrungie or clean washcloth.  5.  Apply the CHG Soap to your body ONLY FROM THE NECK DOWN.   Do not use on face/ open                           Wound or open sores. Avoid contact with eyes, ears mouth and genitals (private parts).                       Wash face,  Genitals (private parts) with your normal soap.             6.  Wash thoroughly, paying special attention to the area where your surgery  will be performed.  7.  Thoroughly rinse your body with warm water from the neck down.  8.  DO NOT shower/wash with your normal soap after using and rinsing off  the CHG Soap.             9.  Pat yourself dry with a clean towel.            10.  Wear clean pajamas.            11.  Place clean sheets on your bed the night of your first shower and do not  sleep with pets. Day of Surgery : Do not apply any lotions/deodorants the morning of surgery.  Please wear clean clothes to the hospital/surgery center.  FAILURE TO FOLLOW THESE INSTRUCTIONS MAY RESULT IN THE CANCELLATION OF YOUR  SURGERY PATIENT SIGNATURE_________________________________  NURSE SIGNATURE__________________________________  ________________________________________________________________________

## 2019-11-14 ENCOUNTER — Encounter (HOSPITAL_COMMUNITY): Payer: Self-pay

## 2019-11-14 ENCOUNTER — Other Ambulatory Visit: Payer: Self-pay

## 2019-11-14 ENCOUNTER — Encounter (HOSPITAL_COMMUNITY)
Admission: RE | Admit: 2019-11-14 | Discharge: 2019-11-14 | Disposition: A | Discharge status: Home / Self Care | Payer: Medicare Other | Source: Ambulatory Visit | Attending: Urology | Admitting: Urology

## 2019-11-14 DIAGNOSIS — Z01812 Encounter for preprocedural laboratory examination: Secondary | ICD-10-CM | POA: Diagnosis not present

## 2019-11-14 DIAGNOSIS — K219 Gastro-esophageal reflux disease without esophagitis: Secondary | ICD-10-CM | POA: Diagnosis not present

## 2019-11-14 DIAGNOSIS — D494 Neoplasm of unspecified behavior of bladder: Secondary | ICD-10-CM | POA: Diagnosis not present

## 2019-11-14 DIAGNOSIS — Z79899 Other long term (current) drug therapy: Secondary | ICD-10-CM | POA: Insufficient documentation

## 2019-11-14 DIAGNOSIS — I480 Paroxysmal atrial fibrillation: Secondary | ICD-10-CM | POA: Insufficient documentation

## 2019-11-14 DIAGNOSIS — I2510 Atherosclerotic heart disease of native coronary artery without angina pectoris: Secondary | ICD-10-CM | POA: Diagnosis not present

## 2019-11-14 DIAGNOSIS — E785 Hyperlipidemia, unspecified: Secondary | ICD-10-CM | POA: Insufficient documentation

## 2019-11-14 DIAGNOSIS — Z951 Presence of aortocoronary bypass graft: Secondary | ICD-10-CM | POA: Insufficient documentation

## 2019-11-14 DIAGNOSIS — M109 Gout, unspecified: Secondary | ICD-10-CM | POA: Insufficient documentation

## 2019-11-14 DIAGNOSIS — I10 Essential (primary) hypertension: Secondary | ICD-10-CM | POA: Diagnosis not present

## 2019-11-14 DIAGNOSIS — Z7901 Long term (current) use of anticoagulants: Secondary | ICD-10-CM | POA: Insufficient documentation

## 2019-11-14 DIAGNOSIS — Z7982 Long term (current) use of aspirin: Secondary | ICD-10-CM | POA: Diagnosis not present

## 2019-11-14 DIAGNOSIS — M1711 Unilateral primary osteoarthritis, right knee: Secondary | ICD-10-CM | POA: Diagnosis not present

## 2019-11-14 LAB — CBC
HCT: 45.9 % (ref 39.0–52.0)
Hemoglobin: 15.4 g/dL (ref 13.0–17.0)
MCH: 30.8 pg (ref 26.0–34.0)
MCHC: 33.6 g/dL (ref 30.0–36.0)
MCV: 91.8 fL (ref 80.0–100.0)
Platelets: 239 10*3/uL (ref 150–400)
RBC: 5 MIL/uL (ref 4.22–5.81)
RDW: 13.3 % (ref 11.5–15.5)
WBC: 7.8 10*3/uL (ref 4.0–10.5)
nRBC: 0 % (ref 0.0–0.2)

## 2019-11-14 LAB — BASIC METABOLIC PANEL
Anion gap: 11 (ref 5–15)
BUN: 22 mg/dL (ref 8–23)
CO2: 27 mmol/L (ref 22–32)
Calcium: 9.3 mg/dL (ref 8.9–10.3)
Chloride: 99 mmol/L (ref 98–111)
Creatinine, Ser: 1.47 mg/dL — ABNORMAL HIGH (ref 0.61–1.24)
GFR calc Af Amer: 54 mL/min — ABNORMAL LOW (ref >60)
GFR calc non Af Amer: 47 mL/min — ABNORMAL LOW (ref >60)
Glucose, Bld: 102 mg/dL — ABNORMAL HIGH (ref 70–99)
Potassium: 3.6 mmol/L (ref 3.5–5.1)
Sodium: 137 mmol/L (ref 135–145)

## 2019-11-14 LAB — NOVEL CORONAVIRUS, NAA (HOSP ORDER, SEND-OUT TO REF LAB; TAT 18-24 HRS): SARS-CoV-2, NAA: NOT DETECTED

## 2019-11-14 NOTE — Progress Notes (Signed)
PCP - Thea Alken Cardiologist - Croitoru, Mihai, MD, cardiac clearance 11-08-2019 tele note epic   Chest x-ray - 02-04-2019 epic  EKG - 08-15-2019 epic Stress Test -  ECHO -  Cardiac Cath -   Sleep Study -  CPAP -   Fasting Blood Sugar -  Checks Blood Sugar _____ times a day  Blood Thinner Instructions: Aspirin Instructions: 81mg ,  hold x 1 week  Last Dose:08-06-2019  Anesthesia review:   Hx of CAB 2013 , htn , Afib (s/p CABG) . Cards managed by Dr Sallyanne Kuster, lov with cards 08-15-2019 .  Today patient denies any acute cardiac symptoms.   Patient denies shortness of breath, fever, cough and chest pain at PAT appointment   Patient verbalized understanding of instructions that were given to them at the PAT appointment. Patient was also instructed that they will need to review over the PAT instructions again at home before surgery.

## 2019-11-15 NOTE — Anesthesia Preprocedure Evaluation (Addendum)
Anesthesia Evaluation  Patient identified by MRN, date of birth, ID band Patient awake    Reviewed: Allergy & Precautions, NPO status , Patient's Chart, lab work & pertinent test results  Airway Mallampati: II  TM Distance: >3 FB Neck ROM: Full    Dental no notable dental hx.    Pulmonary neg pulmonary ROS,    Pulmonary exam normal breath sounds clear to auscultation       Cardiovascular hypertension, Pt. on medications Normal cardiovascular exam Rhythm:Regular Rate:Normal  CAD s/p CABG x 4 in 2013 without subsequent intervention  08/15/2019 Rate 57 bpm Sinus bradycardia   ETT 06/13/2015  There was no ST segment deviation noted during stress.  No T wave inversion was noted during stress.  The patient reported shortness of breath during the stress test. The patient experienced no angina during the stress test.  [facility] Treadmill Score: low risk   Neuro/Psych Hx polio, residual weakness LUE  Neuromuscular disease negative psych ROS   GI/Hepatic Neg liver ROS, hiatal hernia, GERD  Medicated and Controlled,  Endo/Other  negative endocrine ROS  Renal/GU CRFRenal disease   Bladder tumor    Musculoskeletal  (+) Arthritis , Osteoarthritis,  Degenerative disc disease   Abdominal   Peds  Hematology negative hematology ROS (+)   Anesthesia Other Findings HLD  Reproductive/Obstetrics negative OB ROS                            Anesthesia Physical Anesthesia Plan  ASA: III  Anesthesia Plan: General   Post-op Pain Management:    Induction: Intravenous  PONV Risk Score and Plan: 2 and Ondansetron, Dexamethasone and Treatment may vary due to age or medical condition  Airway Management Planned: Oral ETT  Additional Equipment: None  Intra-op Plan:   Post-operative Plan: Extubation in OR  Informed Consent: I have reviewed the patients History and Physical, chart, labs and discussed the  procedure including the risks, benefits and alternatives for the proposed anesthesia with the patient or authorized representative who has indicated his/her understanding and acceptance.     Dental advisory given  Plan Discussed with: CRNA  Anesthesia Plan Comments: (See PAT note 11/14/2019, Konrad Felix, PA-C)       Anesthesia Quick Evaluation

## 2019-11-15 NOTE — Progress Notes (Signed)
Anesthesia Chart Review   Case: [id number] Date/Time: 11/16/19 0845   Procedure: TRANSURETHRAL RESECTION OF BLADDER TUMOR (TURBT) WITH INSTILLATION OF POST OPERATIVE CHEMOTHERAPY (N/A )   Anesthesia type: General   Pre-op diagnosis: BLADDER TUMOR   Location: [facility] ROOM 03 / [facility]   Surgeon: Kathie Rhodes, MD      DISCUSSION:73 y.o. never smoker with h/o HLD, GERD, HTN, CAD (CABG 2013), PAF, bladder tumor scheduled for above procedure 11/16/2019 with Dr. Kathie Rhodes.   Cleared by cardiologist, Dr. Sanda Klein, 11/08/2019.  Per Roby Lofts, PA-C, "Hunter [name] was last seen on 08/15/2019 by Coletta Memos, NP.  Since that day, Hunter [name] has done well from a cardiac standpoint. He is still quite active, golfing, walking, mowing his lawn, and working 2 days per week. He can easily complete 4 METs without anginal complaints. Therefore, based on ACC/AHA guidelines, the patient would be at acceptable risk for the planned procedure without further cardiovascular testing.  Patient has a history of CAD s/p CABG in 2013 without subsequent intervention, therefore patient can hold aspirin 5 days prior to his upcoming bladder surgery. Aspirin should be restarted as soon as he is cleared to do so by Dr. Karsten Ro."  Anticipate pt can proceed with planned procedure barring acute status change.   VS: BP 121/81   Pulse 68   Temp 36.8 C (Oral)   Resp 16   Ht 5\' 9"  (1.753 m)   Wt 81.2 kg   SpO2 98%   BMI 26.43 kg/m   PROVIDERS: [name] E is PCP   Croitoru, Dani Gobble, MD is Cardiologist  LABS: Labs reviewed: Acceptable for surgery. (all labs ordered are listed, but only abnormal results are displayed)  Labs Reviewed  BASIC METABOLIC PANEL - Abnormal; Notable for the following components:      Result Value   Glucose, Bld 102 (*)    Creatinine, Ser 1.47 (*)    GFR calc non Af Amer 47 (*)    GFR calc Af Amer 54 (*)    All other components within normal limits  CBC      IMAGES:   EKG: 08/15/2019 Rate 57 bpm Sinus bradycardia   CV: ETT 06/13/2015  There was no ST segment deviation noted during stress.  No T wave inversion was noted during stress.  The patient reported shortness of breath during the stress test. The patient experienced no angina during the stress test.  [facility] Treadmill Score: low risk   Negative stress test without evidence of ischemia at given workload. Past Medical History:  Diagnosis Date  . Atrial fibrillation, rapid ([place])    With high ventricular rates. Intol amio, was on Sotalol, now on Lopressor  . Bursitis of right shoulder   . CAD (coronary artery disease) 11/2012   CABG x 4 using left internal mammary artery and right endovein harvest. (LIMA-LAD; VG-OM1, OM2, VG-PD), EF 55-60% at cath  . Degenerative disc disease, lumbar   . Epigastric pain    Myoview 11/09/12 Showed evidence of reversible ischemia in both the inferior wall & the anteroapical distribution.   . Erectile dysfunction    H/O ED while taking a beta-blocker  . GERD (gastroesophageal reflux disease)   . Gout   . Hiatal hernia   . Hyperlipidemia   . Hypertension    Systemic HTN. Pt has H/O of ED when taking a beta-blocker. ECHO 11/26/08 Showed no evidence of any significant valvular disease, Estimated EF = 50-60%.  . Kidney stone    "  i have  6-7 stones imbedded in my left kidney "   . Mononeuritis of unspecified site   . Nocturia   . NSVT (nonsustained ventricular tachycardia) ([place])   . Obesity   . 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 PAF (paroxysmal atrial fibrillation) ([facility])    Post CABG. Amia intol. Was on Sotalol, now on Lopressor  . 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 Seasonal allergies   . significant CAD requiring CABG 12/15/2012  . Skin cancer (melanoma) ([place]) 08/04/2015   left side of face   . Skin  lesion of face    hx of    Past Surgical History:  Procedure Laterality Date  . CARDIAC CATHETERIZATION     12/08/2012  . COLONOSCOPY    . CORONARY ARTERY BYPASS GRAFT  12/12/2012   CABG x 4 using left internal mammary artery and right endovein harvest. (LIMA-LAD; VG-OM1, OM2, VG-PD)  . EYE SURGERY     had lasik surgery bilaterally 1996  . LEFT HEART CATHETERIZATION WITH CORONARY ANGIOGRAM N/A 12/08/2012   Procedure: LEFT HEART CATHETERIZATION WITH CORONARY ANGIOGRAM;  Surgeon: Sanda Klein, MD;  Location: [facility];  Service: Cardiovascular;  Laterality: N/A;  . SHOULDER SURGERY     for effects of polio  . SKIN CANCER EXCISION  2016   left side of face   . UPPER GI ENDOSCOPY      MEDICATIONS: . allopurinol (ZYLOPRIM) 300 MG tablet  . aspirin 81 MG tablet  . carvedilol (COREG) 6.25 MG tablet  . Omega-3 Fatty Acids (FISH OIL) 1200 MG CAPS  . omeprazole (PRILOSEC) 20 MG capsule  . simvastatin (ZOCOR) 40 MG tablet  . triamterene-hydrochlorothiazide (MAXZIDE-25) 37.5-25 MG tablet   No current facility-administered medications for this encounter.      Maia Plan [facility] Pre-Surgical Testing [phone] 11/15/19  10:57 AM

## 2019-11-16 ENCOUNTER — Ambulatory Visit (HOSPITAL_COMMUNITY): Payer: Medicare Other | Admitting: Physician Assistant

## 2019-11-16 ENCOUNTER — Ambulatory Visit (HOSPITAL_COMMUNITY)
Admission: RE | Admit: 2019-11-16 | Discharge: 2019-11-16 | Disposition: A | Discharge status: Home / Self Care | Payer: Medicare Other | Attending: Urology | Admitting: Urology

## 2019-11-16 ENCOUNTER — Ambulatory Visit (HOSPITAL_COMMUNITY): Payer: Medicare Other | Admitting: Certified Registered Nurse Anesthetist

## 2019-11-16 ENCOUNTER — Other Ambulatory Visit: Payer: Self-pay

## 2019-11-16 ENCOUNTER — Encounter (HOSPITAL_COMMUNITY): Payer: Self-pay | Admitting: *Deleted

## 2019-11-16 ENCOUNTER — Encounter (HOSPITAL_COMMUNITY)
Admission: RE | Disposition: A | Discharge status: Home / Self Care | Payer: Self-pay | Source: Home / Self Care | Attending: Urology

## 2019-11-16 DIAGNOSIS — R3129 Other microscopic hematuria: Secondary | ICD-10-CM | POA: Diagnosis not present

## 2019-11-16 DIAGNOSIS — Z951 Presence of aortocoronary bypass graft: Secondary | ICD-10-CM | POA: Diagnosis not present

## 2019-11-16 DIAGNOSIS — N281 Cyst of kidney, acquired: Secondary | ICD-10-CM | POA: Insufficient documentation

## 2019-11-16 DIAGNOSIS — Z888 Allergy status to other drugs, medicaments and biological substances status: Secondary | ICD-10-CM | POA: Diagnosis not present

## 2019-11-16 DIAGNOSIS — Z87442 Personal history of urinary calculi: Secondary | ICD-10-CM | POA: Diagnosis not present

## 2019-11-16 DIAGNOSIS — M1990 Unspecified osteoarthritis, unspecified site: Secondary | ICD-10-CM | POA: Diagnosis not present

## 2019-11-16 DIAGNOSIS — Z6826 Body mass index (BMI) 26.0-26.9, adult: Secondary | ICD-10-CM | POA: Insufficient documentation

## 2019-11-16 DIAGNOSIS — C674 Malignant neoplasm of posterior wall of bladder: Secondary | ICD-10-CM | POA: Insufficient documentation

## 2019-11-16 DIAGNOSIS — Z7982 Long term (current) use of aspirin: Secondary | ICD-10-CM | POA: Diagnosis not present

## 2019-11-16 DIAGNOSIS — M109 Gout, unspecified: Secondary | ICD-10-CM | POA: Insufficient documentation

## 2019-11-16 DIAGNOSIS — I2510 Atherosclerotic heart disease of native coronary artery without angina pectoris: Secondary | ICD-10-CM | POA: Insufficient documentation

## 2019-11-16 DIAGNOSIS — Z8612 Personal history of poliomyelitis: Secondary | ICD-10-CM | POA: Diagnosis not present

## 2019-11-16 DIAGNOSIS — Z79899 Other long term (current) drug therapy: Secondary | ICD-10-CM | POA: Diagnosis not present

## 2019-11-16 DIAGNOSIS — N200 Calculus of kidney: Secondary | ICD-10-CM | POA: Insufficient documentation

## 2019-11-16 DIAGNOSIS — I480 Paroxysmal atrial fibrillation: Secondary | ICD-10-CM | POA: Diagnosis not present

## 2019-11-16 DIAGNOSIS — E785 Hyperlipidemia, unspecified: Secondary | ICD-10-CM | POA: Insufficient documentation

## 2019-11-16 DIAGNOSIS — Z885 Allergy status to narcotic agent status: Secondary | ICD-10-CM | POA: Insufficient documentation

## 2019-11-16 DIAGNOSIS — E669 Obesity, unspecified: Secondary | ICD-10-CM | POA: Insufficient documentation

## 2019-11-16 DIAGNOSIS — I10 Essential (primary) hypertension: Secondary | ICD-10-CM | POA: Diagnosis not present

## 2019-11-16 DIAGNOSIS — D494 Neoplasm of unspecified behavior of bladder: Secondary | ICD-10-CM

## 2019-11-16 HISTORY — PX: TRANSURETHRAL RESECTION OF BLADDER TUMOR: SHX2575

## 2019-11-16 SURGERY — TURBT (TRANSURETHRAL RESECTION OF BLADDER TUMOR)
Anesthesia: General

## 2019-11-16 MED ORDER — ONDANSETRON HCL 4 MG/2ML IJ SOLN: 4.0000 mg | Freq: Once | INTRAMUSCULAR | Status: DC | PRN

## 2019-11-16 MED ORDER — GEMCITABINE CHEMO FOR BLADDER INSTILLATION 2000 MG
2000.0000 mg | Freq: Once | INTRAVENOUS | Status: AC
  Administered 2019-11-16: 2000 mg via INTRAVESICAL
  Filled 2019-11-16: qty 2000

## 2019-11-16 MED ORDER — PHENYLEPHRINE 40 MCG/ML (10ML) SYRINGE FOR IV PUSH (FOR BLOOD PRESSURE SUPPORT)
PREFILLED_SYRINGE | INTRAVENOUS | Status: AC
  Filled 2019-11-16: qty 10

## 2019-11-16 MED ORDER — LACTATED RINGERS IV SOLN
INTRAVENOUS | Status: DC
  Administered 2019-11-16: 08:00:00 via INTRAVENOUS

## 2019-11-16 MED ORDER — ONDANSETRON HCL 4 MG/2ML IJ SOLN
INTRAMUSCULAR | Status: DC | PRN
  Administered 2019-11-16: 4 mg via INTRAVENOUS

## 2019-11-16 MED ORDER — LIDOCAINE 2% (20 MG/ML) 5 ML SYRINGE
INTRAMUSCULAR | Status: AC
  Filled 2019-11-16: qty 5

## 2019-11-16 MED ORDER — SODIUM CHLORIDE 0.9 % IV BOLUS
500.0000 mL | Freq: Once | INTRAVENOUS | Status: AC
  Administered 2019-11-16: 500 mL via INTRAVENOUS

## 2019-11-16 MED ORDER — PHENAZOPYRIDINE HCL 200 MG PO TABS: 200.0000 mg | ORAL_TABLET | Freq: Three times a day (TID) | ORAL | 0 refills | Status: DC | PRN

## 2019-11-16 MED ORDER — FENTANYL CITRATE (PF) 100 MCG/2ML IJ SOLN
INTRAMUSCULAR | Status: AC
  Filled 2019-11-16: qty 2

## 2019-11-16 MED ORDER — PROPOFOL 10 MG/ML IV BOLUS
INTRAVENOUS | Status: DC | PRN
  Administered 2019-11-16: 140 mg via INTRAVENOUS

## 2019-11-16 MED ORDER — PHENYLEPHRINE 40 MCG/ML (10ML) SYRINGE FOR IV PUSH (FOR BLOOD PRESSURE SUPPORT)
PREFILLED_SYRINGE | INTRAVENOUS | Status: DC | PRN
  Administered 2019-11-16: 120 ug via INTRAVENOUS

## 2019-11-16 MED ORDER — SODIUM CHLORIDE 0.9 % IV SOLN: Freq: Once | INTRAVENOUS | Status: DC

## 2019-11-16 MED ORDER — FENTANYL CITRATE (PF) 100 MCG/2ML IJ SOLN
INTRAMUSCULAR | Status: DC | PRN
  Administered 2019-11-16 (×2): 50 ug via INTRAVENOUS

## 2019-11-16 MED ORDER — 0.9 % SODIUM CHLORIDE (POUR BTL) OPTIME
TOPICAL | Status: DC | PRN
  Administered 2019-11-16: 1000 mL

## 2019-11-16 MED ORDER — SODIUM CHLORIDE 0.9 % IR SOLN
Status: DC | PRN
  Administered 2019-11-16: 6000 mL via INTRAVESICAL

## 2019-11-16 MED ORDER — ACETAMINOPHEN 500 MG PO TABS
1000.0000 mg | ORAL_TABLET | Freq: Once | ORAL | Status: AC
  Administered 2019-11-16: 1000 mg via ORAL
  Filled 2019-11-16: qty 2

## 2019-11-16 MED ORDER — FENTANYL CITRATE (PF) 100 MCG/2ML IJ SOLN: 25.0000 ug | INTRAMUSCULAR | Status: DC | PRN

## 2019-11-16 MED ORDER — ROCURONIUM BROMIDE 10 MG/ML (PF) SYRINGE
PREFILLED_SYRINGE | INTRAVENOUS | Status: AC
  Filled 2019-11-16: qty 10

## 2019-11-16 MED ORDER — HYDROCODONE-ACETAMINOPHEN 10-325 MG PO TABS: 1.0000 | ORAL_TABLET | ORAL | 0 refills | Status: DC | PRN

## 2019-11-16 MED ORDER — CIPROFLOXACIN IN D5W 400 MG/200ML IV SOLN
400.0000 mg | INTRAVENOUS | Status: AC
  Administered 2019-11-16: 400 mg via INTRAVENOUS
  Filled 2019-11-16: qty 200

## 2019-11-16 MED ORDER — LIDOCAINE HCL URETHRAL/MUCOSAL 2 % EX GEL
CUTANEOUS | Status: AC
  Filled 2019-11-16: qty 30

## 2019-11-16 MED ORDER — PROPOFOL 10 MG/ML IV BOLUS
INTRAVENOUS | Status: AC
  Filled 2019-11-16: qty 20

## 2019-11-16 SURGICAL SUPPLY — 21 items
BAG DRN RND TRDRP ANRFLXCHMBR (UROLOGICAL SUPPLIES) ×1
BAG URINE DRAIN 2000ML AR STRL (UROLOGICAL SUPPLIES) ×1 IMPLANT
BAG URO CATCHER STRL LF (MISCELLANEOUS) ×2 IMPLANT
CATH FOLEY 2WAY SLVR  5CC 16FR (CATHETERS) ×1
CATH FOLEY 2WAY SLVR 5CC 16FR (CATHETERS) IMPLANT
ELECT REM PT RETURN 15FT ADLT (MISCELLANEOUS) ×1 IMPLANT
EVACUATOR MICROVAS BLADDER (UROLOGICAL SUPPLIES) ×1 IMPLANT
GLOVE BIOGEL M 8.0 STRL (GLOVE) ×2 IMPLANT
GOWN STRL REUS W/TWL XL LVL3 (GOWN DISPOSABLE) ×2 IMPLANT
HOLDER FOLEY CATH W/STRAP (MISCELLANEOUS) IMPLANT
KIT TURNOVER KIT A (KITS) ×1 IMPLANT
LOOP CUT BIPOLAR 24F LRG (ELECTROSURGICAL) ×1 IMPLANT
MANIFOLD NEPTUNE II (INSTRUMENTS) ×2 IMPLANT
NS IRRIG 1000ML POUR BTL (IV SOLUTION) ×1 IMPLANT
PACK CYSTO (CUSTOM PROCEDURE TRAY) ×2 IMPLANT
PENCIL SMOKE EVACUATOR (MISCELLANEOUS) IMPLANT
PLUG CATH AND CAP STER (CATHETERS) ×1 IMPLANT
SYRINGE IRR TOOMEY STRL 70CC (SYRINGE) IMPLANT
TUBING CONNECTING 10 (TUBING) ×2 IMPLANT
TUBING UROLOGY SET (TUBING) ×2 IMPLANT
WATER STERILE IRR 500ML POUR (IV SOLUTION) ×1 IMPLANT

## 2019-11-16 NOTE — Op Note (Signed)
PATIENT:  Hunter [name]  PRE-OPERATIVE DIAGNOSIS: Bladder tumor  POST-OPERATIVE DIAGNOSIS: Same  PROCEDURE:  Procedure(s): 1. TRANSURETHRAL RESECTION OF BLADDER TUMOR (TURBT) (0.7cm.) 2. Instillation of intravesical chemotherapy (Gemcitabine)  SURGEON:  Surgeon(s): Claybon Jabs  ANESTHESIA:   General  EBL:  Minimal  DRAINS: Urethral catheter (16 Fr. Foley)   SPECIMEN:  Bladder tumor  DISPOSITION OF SPECIMEN:  PATHOLOGY  Indication: [name] is a 73 year old male who has been followed over the years for BPH.  At the time of the recent routine follow-up appointment he was found to have microscopic hematuria.  He has never smoked and has no risk factors for urothelial malignancy.  A CT scan was obtained which revealed no abnormality of the upper tract which could account for his hematuria.  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  He is brought to the operating room today for transurethral resection of his bladder tumor and instillation of gemcitabine.  Description of operation: The patient was taken to the operating room and administered general anesthesia. They were then placed on the table and moved to the dorsal lithotomy position after which the genitalia was sterilely prepped and draped. An official timeout was then performed.  His meatus would not accept the resectoscope initially so I dilated with Leander Rams sounds to 51 [place].  The 46 French resectoscope with the 30 lens and visual obturator were then passed into the bladder under direct visualization. Urethra appeared normal. The visual obturator was then removed and the Gyrus resectoscope element with 30  lens was then inserted and the bladder was fully and systematically inspected. Ureteral orifices were noted to be in the normal anatomic positions.  The bladder had 4+ trabeculation with multiple cellules that were shallow.  The entire bladder was fully and  systematically inspected and a single tumor was located on the posterior wall just to the right of the midline.  No other lesions were identified.  Withdrawing the scope into the prostatic urethra revealed high bladder neck and bilobar hypertrophy but no lesions.  I first began by resecting the tumor completely from the bladder.  I then fulgurated the base and the surrounding mucosa.  Reinspection of the bladder revealed all obvious tumor had been fully resected and there was no evidence of perforation. The Microvasive evacuator was then used to irrigate the bladder and remove all of the portions of bladder tumor which were sent to pathology. I then removed the resectoscope.  A 16 French Foley catheter was then inserted in the bladder and irrigated. The irrigant returned slightly pink with no clots. The patient was awakened and taken to the recovery room.  While in the recovery room 2 g of gemcitabine in 52.6 cc of sterile water was instilled in the bladder through the catheter and the catheter was plugged. This will remain indwelling for approximately one hour. It will then be drained from the bladder and the catheter will be removed and the patient discharged home.  PLAN OF CARE: Discharge to home after PACU  PATIENT DISPOSITION:  PACU - hemodynamically stable.

## 2019-11-16 NOTE — Transfer of Care (Signed)
Immediate Anesthesia Transfer of Care Note  Patient: Hunter [name]  Procedure(s) Performed: TRANSURETHRAL RESECTION OF BLADDER TUMOR (TURBT) WITH INSTILLATION OF POST OPERATIVE CHEMOTHERAPY (N/A )  Patient Location: PACU  Anesthesia Type:General  Level of Consciousness: awake, alert  and oriented  Airway & Oxygen Therapy: Patient Spontanous Breathing and Patient connected to face mask oxygen  Post-op Assessment: Report given to RN and Post -op Vital signs reviewed and stable  Post vital signs: Reviewed and stable  Last Vitals:  Vitals Value Taken Time  BP 119/75 11/16/19 1003  Temp    Pulse 65 11/16/19 1010  Resp 13 11/16/19 1010  SpO2 100 % 11/16/19 1010  Vitals shown include unvalidated device data.  Last Pain:  Vitals:   11/16/19 0735  TempSrc:   PainSc: 0-No pain      Patients Stated Pain Goal: 3 [phone]  Complications: No apparent anesthesia complications

## 2019-11-16 NOTE — Progress Notes (Signed)
Patient  In PACU unable to void at 1447 bladder scanned was 86. Notified Dr Karsten Ro. Ordered 532ml Bolus.  At 1615 patient still unable to void. Noted blood clot in urinal and patient dribbling blood with no urine. Bladder scanned again 234ml. Notified Dr Karsten Ro. Orders to place foley and go home. Remove foley on Monday.

## 2019-11-16 NOTE — Anesthesia Procedure Notes (Signed)
Procedure Name: LMA Insertion Date/Time: 11/16/2019 9:20 AM Performed by: [place] Rieley Khalsa C, CRNA Pre-anesthesia Checklist: Patient identified, Emergency Drugs available, Suction available and Patient being monitored Patient Re-evaluated:Patient Re-evaluated prior to induction Oxygen Delivery Method: Circle system utilized Preoxygenation: Pre-oxygenation with 100% oxygen Induction Type: IV induction Ventilation: Mask ventilation without difficulty LMA: LMA inserted LMA Size: 4.0 Number of attempts: 1 Airway Equipment and Method: Bite block Placement Confirmation: positive ETCO2 Tube secured with: Tape Dental Injury: Teeth and Oropharynx as per pre-operative assessment

## 2019-11-16 NOTE — Anesthesia Postprocedure Evaluation (Signed)
Anesthesia Post Note  Patient: Hunter [name]  Procedure(s) Performed: TRANSURETHRAL RESECTION OF BLADDER TUMOR (TURBT) WITH INSTILLATION OF POST OPERATIVE CHEMOTHERAPY (N/A )     Patient location during evaluation: PACU Anesthesia Type: General Level of consciousness: awake and alert, oriented and patient cooperative Pain management: pain level controlled Vital Signs Assessment: post-procedure vital signs reviewed and stable Respiratory status: spontaneous breathing, nonlabored ventilation and respiratory function stable Cardiovascular status: blood pressure returned to baseline and stable Postop Assessment: no apparent nausea or vomiting Anesthetic complications: no    Last Vitals:  Vitals:   11/16/19 1045 11/16/19 1100  BP: 128/74 131/73  Pulse: 61 61  Resp: 15 16  Temp:    SpO2: 98% 98%    Last Pain:  Vitals:   11/16/19 1100  TempSrc:   PainSc: 0-No pain                 Pervis Hocking

## 2019-11-16 NOTE — H&P (Signed)
HPI: Hunter [name] is a 73 year-old [place] a newly diagnosed bladder tumor.  The patient did not have cystoscopy at their last visit.   He has had a CT scan within the last 12 months.   The patient denies any progression of his voiding symptoms. The patient denies any new or recent onset of back/flank pain or suprapubic pain.   The patient does not have a history of recurrent UTIs. They have a history of kidney stones. He has not been exposed to occupational hazards that may increase their risks for developing cancer. The patient has no family history of GU malignancy.   The patient has seen noted blood in his urine since the last visit.     ALLERGIES: OxyCONTIN    MEDICATIONS: Allopurinol 300 mg tablet  Metoprolol Tartrate 25 mg tablet  Simvastatin 40 mg tablet  Aspir 81  Fish Oil  Triamterene-Hydrochlorothiazid   GU PSH: Locm 300-399Mg /Ml Iodine,1Ml - 10/29/2019  PSH Notes: CABG (CABG), Shoulder Surgery   NON-GU PSH: CABG (coronary artery bypass grafting) - 2014  GU PMH: Microscopic hematuria, He has microscopic hematuria. We discussed the fact that he has never smoked nor been exposed to significant secondhand smoke but has had stones in the past. Regardless I told him that my recommendation is upper and lower tract evaluation in this situation. - 10/29/2019 Urinary Hesitancy (Stable), He has some mild hesitancy but he said it is not enough of a bother that he would want to consider any form of pharmacologic therapy. - 10/29/2019 BPH w/LUTS (Stable), He has BPH by exam. This is really unchanged but he has developed some mild voiding symptoms. - 10/31/2018 Elevated PSA (Stable), Has a history of an elevated PSA but I again noted that his prostate was smooth and benign. His PSA done last month was in line with his previous values at 4.58. I have recommended continued monitoring with repeat DRE and PSA again in 1 year. - 10/31/2018, (Stable), He has a history of an elevated PSA that  has varied over time but has maintained an excellent free to total ratio. His current PSA is 4.28 and that has decreased from where was a year ago. His prostate is smooth and benign. What I have recommended is we check his PSA again in 6 months with repeat DRE and PSA again in 1 year., - 11/22/2017 (Stable), His PSA has increased slightly and I wanted to recheck it again in 6 months but it doesn't appear that occurred so as long as he has no infection I would consider rechecking a PSA if he remains symptomatic., - 2018 (Worsening), His prostate was free of any nodularity or induration but his PSA has risen slightly over the past year. He has maintained an excellent free to total ratio in the past. We discussed the fact that prostatic enlargement can cause a rising PSA. My recommendation to begin to recheck his PSA again in 6 months and then again 12 months from now in order to monitor it more closely., - 2017, Elevated prostate specific antigen (PSA), - 2016 Nocturia, He is having some double voiding as well as nocturia x2 and have any significant obstructive symptoms and indicates that it is not bad enough that he would want to consider any form of pharmacologic therapy. - 10/31/2018 Urinary Frequency, I told him that I was unclear as to what has caused his acute voiding symptoms. I told him that he clearly has voiding symptoms but has no objective findings with completely clear urine  now and no significant elevation of his PVR. With clear urine I think the likelihood of culture positivity is low but I have to rule out out with a culture and then have given him samples of Myrbetriq 25 mg to take for the next week. If he remains symptomatic further workup will be necessary. - 2018 BPH w/o LUTS, Benign enlargement of prostate - 2016 ED due to arterial insufficiency, Erectile dysfunction due to arterial insufficiency - 2016 Low back pain, Acute left-sided low back pain without sciatica - 2016 Renal calculus,  Kidney stone on left side - 2016 Gross hematuria, Gross Hematuria - 2014 History of urolithiasis, Nephrolithiasis - 2014 Personal Hx Oth male genital organs diseases, History of prostatitis - 2014 Primary hypogonadism, Hypogonadism, testicular - 2014      PMH Notes: Organic erectile dysfunction: He has used both Viagra and Cialis. He reports the Cialis no longer seems to be effective.he then was using Levitra but also intermittently was having difficulty achieving erection. We therefore switched to 5 mg Cialis for daily use.  07/07/2011 - He reported using the Viagra 100mg  in the AM when needed, and Levitra 20mg  at night when needed.  Current therapy: Viagra/sildenafil   Bilateral renal calculi: These have remained stable and asymptomatic without any flank pain, hematuria or passage of any stones in some time.   Hypogonadism: He had a low normal serum testosterone of 221. We discussed the fact that he was symptomatic with decreased energy level and some memory issues. Because of that I told him I'd like to see if he noted improvement with testosterone replacement. His PSA has varied over time but his free to total ratio has always looked good however I did discuss with him the relationship between testosterone replacement and prostate cancer.  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  Has never drank.  Drinks 2 caffeinated drinks per day.     Notes: Never A Smoker, Caffeine Use, Marital History - Currently Married, Tobacco Use, Alcohol Use, Occupation:   REVIEW OF SYSTEMS:    GU Review Male:   Patient denies get up at night to urinate, trouble starting your stream, stream starts and stops, leakage of urine, frequent urination, burning/ pain with urination, erection problems, have to strain to urinate , hard to postpone urination, and penile pain.  Gastrointestinal (Upper):   Patient denies nausea, vomiting, and indigestion/ heartburn.  Gastrointestinal (Lower):   Patient denies diarrhea and constipation.  Constitutional:   Patient denies fever, night sweats, weight loss, and fatigue.  Skin:   Patient denies skin rash/ lesion and itching.  Eyes:   Patient denies blurred vision and double vision.  Ears/ Nose/ Throat:   Patient denies sore throat and sinus problems.  Hematologic/Lymphatic:   Patient denies swollen glands and easy bruising.  Cardiovascular:   Patient denies leg swelling and chest pains.  Respiratory:   Patient denies cough and shortness of breath.  Endocrine:  Patient denies excessive thirst.  Musculoskeletal:   Patient denies back pain and joint pain.  Neurological:   Patient denies headaches and dizziness.  Psychologic:   Patient denies depression and anxiety.   VITAL SIGNS:    Weight 186 lb / 84.37 kg  Height 69 in / 175.26 cm  BP 121/75 mmHg  Pulse 72 /min  Temperature 98.0 F / 36.6 C  BMI 27.5 kg/m   GU PHYSICAL EXAMINATION:    Urethral Meatus: Normal size. No lesion, no wart, no discharge, no polyp. Normal location.  Penis: Circumcised, no warts, no cracks. No dorsal Peyronie's plaques, no left corporal Peyronie's plaques, no right corporal Peyronie's plaques, no scarring, no warts. No balanitis, no meatal stenosis.   MULTI-SYSTEM PHYSICAL EXAMINATION:    Constitutional: Well-nourished. No physical deformities. Normally developed.  Good grooming.  Neck: Neck symmetrical, not swollen. Normal tracheal position.  Respiratory: No labored breathing, no use of accessory muscles.   Cardiovascular: Normal temperature, normal extremity pulses, no swelling, no varicosities.  Lymphatic: No enlargement of neck, axillae, groin.  Skin: No paleness, no jaundice, no cyanosis. No lesion, no ulcer, no rash.  Neurologic / Psychiatric: Oriented to time, oriented to place, oriented to person. No depression, no anxiety, no agitation.  Gastrointestinal: No mass, no tenderness, no rigidity, non obese abdomen.  Eyes: Normal conjunctivae. Normal eyelids.  Ears, Nose, Mouth, and Throat: Left ear no scars, no lesions, no masses. Right ear no scars, no lesions, no masses. Nose no scars, no lesions, no masses. Normal hearing. Normal lips.  Musculoskeletal: Normal gait and station of head and neck.       PAST DATA REVIEWED:  Source Of History:  Patient  Lab Test Review:   BUN/Creatinine  Records Review:   Previous Patient Records, POC Tool  X-Ray Review: C.T. Hematuria: Reviewed Films. Reviewed Report. Discussed With Patient. EXAM: CT ABDOMEN AND PELVIS WITHOUT AND WITH CONTRAST TECHNIQUE: Multidetector CT imaging of the abdomen and pelvis was performed following the standard protocol before and following the bolus administration of intravenous contrast. CONTRAST: 125 cc Omnipaque 300 COMPARISON: None. FINDINGS: Lower chest: Unremarkable. Hepatobiliary: 11 mm hypoattenuating lesion in the anterior right liver flash fills after IV contrast administration (image 22/series 5). There is no evidence for gallstones, gallbladder wall thickening, or pericholecystic fluid. No intrahepatic or extrahepatic biliary dilation. Pancreas: No focal mass lesion. No dilatation of the main duct. No intraparenchymal cyst. No peripancreatic edema. Spleen: No splenomegaly. No focal mass lesion. Adrenals/Urinary Tract: No adrenal nodule or mass. Precontrast imaging shows no  stones in the right kidney or ureter. Approximately 7 stones are seen in the left kidney, best appreciated on coronal imaging. None of these appear obstructive. Dominant stone is in the lower pole measuring 6 x 11 mm. Other stones in the left kidney range in size from 1-2 mm up to about 8 mm. No left ureteral stone. No bladder stone. Imaging after IV contrast administration shows no suspicious enhancing lesion in either kidney. 8 cm exophytic cyst noted interpolar right kidney. 2.3 cm exophytic low-density lesion in the interpolar left kidney shows no internal septation or mural nodularity, compatible with a simple cyst. Additional tiny cortical hypodensities in both kidneys are too small to characterize. Delayed imaging shows no wall thickening or soft tissue filling defect in either intrarenal collecting system or renal pelvis. Left ureter is well opacified and unremarkable. Right ureter incompletely opacified but showing no focal dilatation or mass lesion along its length. No focal bladder wall abnormality evident although  bladder wall trabeculation is associated with several bladder wall diverticuli. Stomach/Bowel: Tiny hiatal hernia. Stomach is unremarkable. No gastric wall thickening. No evidence of outlet obstruction. Duodenum is normally positioned as is the ligament of Treitz. No small bowel wall thickening. No small bowel dilatation. The terminal ileum is normal. The appendix is normal. No gross colonic mass. No colonic wall thickening. Diverticuli are seen scattered along the entire length of the colon without CT findings of diverticulitis. Vascular/Lymphatic: There is abdominal aortic atherosclerosis without aneurysm. Maximum diameter of the abdominal aorta is 2.5 cm with multiple areas of apparent ulcerated plaque. There is no gastrohepatic or hepatoduodenal ligament lymphadenopathy. No intraperitoneal or retroperitoneal lymphadenopathy. No pelvic sidewall lymphadenopathy. Reproductive: Prostate gland  appears enlarged. Other: No intraperitoneal free fluid. Musculoskeletal: Small bilateral groin hernias contain only fat. No worrisome lytic or sclerotic osseous abnormality. IMPRESSION: 1. Multiple nonobstructing left renal stones. No right renal, ureteral or bladder stones. No secondary changes in either kidney or ureter. 2. Bilateral renal cysts. 3. Bladder wall trabeculation with multiple bladder wall diverticuli associated with prostatomegaly. Imaging features suspicious for component of underlying bladder outlet obstruction. 4. 11 mm hypoattenuating lesion in the anterior right liver flash fills after IV contrast administration. In a low risk patient (no known malignancy, hepatic dysfunction, hepatic risk factors, or symptoms attributable to the liver) consensus guidelines suggest that this lesion can be considered benign with no further imaging follow-up warranted. In patients not considered low risk, abdominal MRI with and without contrast is recommended to further evaluate. This recommendation follows ACR consensus guidelines: Managing Incidental Findings on Abdominal CT: White Paper of the ACR Incidental Findings Committee. J Am Coll Radiol 2010;7:754-773 5. Small bilateral groin hernias contain only fat. 6. Aortic Atherosclerosis     10/17/19 10/02/18 10/03/17 10/22/16 11/04/15 10/29/14 10/31/13 10/01/11  PSA  Total PSA 4.41 ng/dl 4.58 ng/dl 4.28 ng/dl 5.27 ng/dl 4.26  3.50  3.64  4.30   Free PSA     1.32    1.64   % Free PSA     31    38     10/06/10 09/01/10  Hormones  Testosterone, Total 344.0  221.0     10/29/19  General Chemistry  BUN 22.0 mg/dL  Creatinine 1.3 mg/dL   PROCEDURES:         Flexible Cystoscopy - Done on 11/05/19 Risks, benefits, and some of the potential complications were discussed. Sterile technique and 2% Lidocaine intraurethral analgesia were used.  Meatus:  Normal size. Normal location. Normal condition.  Urethra:  No strictures.  External Sphincter:  Normal.   Verumontanum:  Normal.  Prostate:  Obstructing. Moderate hyperplasia.  Bladder Neck:  Non-obstructing.  Ureteral Orifices:  Normal location. Normal size. Normal shape. Effluxed clear urine.  Bladder:  Severe trabeculation. Cellules. A diverticulum. A trigone tumor. Normal mucosa. No stones.      The lower urinary tract was carefully examined. The procedure was well-tolerated and without complications. Instructions were given to call the office immediately for bloody urine, difficulty urinating, urinary retention, painful or frequent urination, fever or other illness. The patient stated that he understood these instructions and would comply with them.         Urinalysis w/Scope Dipstick Dipstick Cont'd Micro  Color: Amber Bilirubin: Neg mg/dL WBC/hpf: 0 - 5/hpf  Appearance: Cloudy Ketones: Neg mg/dL RBC/hpf: 40 - 60/hpf  Specific Gravity: 1.010 Blood: 3+ ery/uL Bacteria: Rare (0-9/hpf)  pH: 6.0 Protein: Trace mg/dL Cystals: NS (Not Seen)  Glucose: Neg mg/dL Urobilinogen: 0.2  mg/dL Casts: NS (Not Seen)    Nitrites: Neg Trichomonas: Not Present    Leukocyte Esterase: Neg leu/uL Mucous: Not Present      Epithelial Cells: 0 - 5/hpf      Yeast: NS (Not Seen)      Sperm: Not Present    Notes: renal tubular epi's obs.    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 He will be scheduled for surgery.  2   Microscopic hematuria - R31.21 Stable - It appears his microscopic hematuria is secondary to the papillary lesion identified within his bladder today.  3   Renal calculus - N20.0 Left, He has left renal calculi. They are nonobstructing. These can be monitored.  4   Renal cyst - N28.1 Bilateral, We did discussed the finding of bilateral renal cysts and they are benign nature.          Notes:   I went over the results of the CT scan as well as my cystoscopic findings today which have revealed a  bladder mass consistent with transitional cell carcinoma. We discussed the fact that currently there is no evidence of extravesical extension or pelvic adenopathy based on CT scan findings. Further characterization of the lesion is required for grading and staging purposes. We discussed proceeding with evaluation using transurethral resection of the lesion. I have discussed the procedure in detail as well as the potential risks and complications associated with this form of surgery. We also discussed the probability of successful resection of the intravesical portion of this lesion. I have recommended, as long as there is no contraindication at the time of surgery, the placement of intravesical Gemcitabine in order to reduce the risk of recurrence. We did discuss the potential side effects of this form of intravesical chemotherapy. The procedure will be performed under anesthesia as an outpatient.

## 2019-11-16 NOTE — Discharge Instructions (Addendum)
Transurethral Resection of Bladder Tumor (TURBT)   Definition:  Transurethral Resection of the Bladder Tumor is a surgical procedure used to diagnose and remove tumors within the bladder. TURBT is the most common treatment for early stage bladder cancer.  General instructions:     Your recent bladder surgery requires very little post hospital care but some definite precautions.  Despite the fact that no skin incisions were used, the area around the bladder incisions are raw and covered with scabs to promote healing and prevent bleeding. Certain precautions are needed to insure that the scabs are not disturbed over the next 2-4 weeks while the healing proceeds.  Because the raw surface inside your bladder and the irritating effects of urine you may expect frequency of urination and/or urgency (a stronger desire to urinate) and perhaps even getting up at night more often. This will usually resolve or improve slowly over the healing period. You may see some blood in your urine over the first 6 weeks. Do not be alarmed, even if the urine was clear for a while. Get off your feet and drink lots of fluids until clearing occurs. If you start to pass clots or don't improve call us.  Catheter: (If you are discharged with a catheter.)  1. Keep your catheter secured to your leg at all times with tape or the supplied strap. 2. You may experience leakage of urine around your catheter- as long as the  catheter continues to drain, this is normal.  If your catheter stops draining  go to the ER. 3. You may also have blood in your urine, even after it has been clear for  several days; you may even pass some small blood clots or other material.  This  is normal as well.  If this happens, sit down and drink plenty of water to help  make urine to flush out your bladder.  If the blood in your urine becomes worse  after doing this, contact our office or return to the ER. 4. You may use the leg bag (small bag)  during the day, but use the large bag at  night.  Diet:  You may return to your normal diet immediately. Because of the raw surface of your bladder, alcohol, spicy foods, foods high in acid and drinks with caffeine may cause irritation or frequency and should be used in moderation. To keep your urine flowing freely and avoid constipation, drink plenty of fluids during the day (8-10 glasses). Tip: Avoid cranberry juice because it is very acidic.  Activity:  Your physical activity doesn't need to be restricted. However, if you are very active, you may see some blood in the urine. We suggest that you reduce your activity under the circumstances until the bleeding has stopped.  Bowels:  It is important to keep your bowels regular during the postoperative period. Straining with bowel movements can cause bleeding. A bowel movement every other day is reasonable. Use a mild laxative if needed, such as milk of magnesia 2-3 tablespoons, or 2 Dulcolax tablets. Call if you continue to have problems. If you had been taking narcotics for pain, before, during or after your surgery, you may be constipated. Take a laxative if necessary.    Medication:  You should resume your pre-surgery medications unless told not to. In addition you may be given an antibiotic to prevent or treat infection. Antibiotics are not always necessary. All medication should be taken as prescribed until the bottles are finished unless you are having  an unusual reaction to one of the drugs.   General Anesthesia, Adult, Care After This sheet gives you information about how to care for yourself after your procedure. Your health care provider may also give you more specific instructions. If you have problems or questions, contact your health care provider. What can I expect after the procedure? After the procedure, the following side effects are common:  Pain or discomfort at the IV site.  Nausea.  Vomiting.  Sore  throat.  Trouble concentrating.  Feeling cold or chills.  Weak or tired.  Sleepiness and fatigue.  Soreness and body aches. These side effects can affect parts of the body that were not involved in surgery. Follow these instructions at home:  For at least 24 hours after the procedure:  Have a responsible adult stay with you. It is important to have someone help care for you until you are awake and alert.  Rest as needed.  Do not: ? Participate in activities in which you could fall or become injured. ? Drive. ? Use heavy machinery. ? Drink alcohol. ? Take sleeping pills or medicines that cause drowsiness. ? Make important decisions or sign legal documents. ? Take care of children on your own. Eating and drinking  Follow any instructions from your health care provider about eating or drinking restrictions.  When you feel hungry, start by eating small amounts of foods that are soft and easy to digest (bland), such as toast. Gradually return to your regular diet.  Drink enough fluid to keep your urine pale yellow.  If you vomit, rehydrate by drinking water, juice, or clear broth. General instructions  If you have sleep apnea, surgery and certain medicines can increase your risk for breathing problems. Follow instructions from your health care provider about wearing your sleep device: ? Anytime you are sleeping, including during daytime naps. ? While taking prescription pain medicines, sleeping medicines, or medicines that make you drowsy.  Return to your normal activities as told by your health care provider. Ask your health care provider what activities are safe for you.  Take over-the-counter and prescription medicines only as told by your health care provider.  If you smoke, do not smoke without supervision.  Keep all follow-up visits as told by your health care provider. This is important. Contact a health care provider if:  You have nausea or vomiting that does not  get better with medicine.  You cannot eat or drink without vomiting.  You have pain that does not get better with medicine.  You are unable to pass urine.  You develop a skin rash.  You have a fever.  You have redness around your IV site that gets worse. Get help right away if:  You have difficulty breathing.  You have chest pain.  You have blood in your urine or stool, or you vomit blood. Summary  After the procedure, it is common to have a sore throat or nausea. It is also common to feel tired.  Have a responsible adult stay with you for the first 24 hours after general anesthesia. It is important to have someone help care for you until you are awake and alert.  When you feel hungry, start by eating small amounts of foods that are soft and easy to digest (bland), such as toast. Gradually return to your regular diet.  Drink enough fluid to keep your urine pale yellow.  Return to your normal activities as told by your health care provider. Ask your health care  provider what activities are safe for you. This information is not intended to replace advice given to you by your health care provider. Make sure you discuss any questions you have with your health care provider. Document Released: 03/21/2001 Document Revised: 12/16/2017 Document Reviewed: 07/29/2017 Elsevier Patient Education  2020 [place] on Monday. If unable to remove foley on your own. Call the office and they will take it out for you!

## 2019-11-17 ENCOUNTER — Encounter (HOSPITAL_COMMUNITY): Payer: Self-pay | Admitting: Urology

## 2019-11-19 LAB — SURGICAL PATHOLOGY

## 2020-08-04 ENCOUNTER — Other Ambulatory Visit: Payer: Self-pay | Admitting: Urology

## 2020-08-04 ENCOUNTER — Other Ambulatory Visit: Payer: Self-pay | Admitting: Cardiovascular Disease

## 2020-08-05 MED ORDER — GEMCITABINE CHEMO FOR BLADDER INSTILLATION 2000 MG: 2000.0000 mg | Freq: Once | INTRAVENOUS | Status: DC

## 2020-08-06 ENCOUNTER — Telehealth: Payer: Self-pay | Admitting: *Deleted

## 2020-08-06 NOTE — Telephone Encounter (Signed)
   [facility] Pre-operative Risk Assessment    HEARTCARE STAFF: - Please ensure there is not already an duplicate clearance open for this procedure. - Under Visit Info/Reason for Call, type in Other and utilize the format Clearance MM/DD/YY or Clearance TBD. Do not use dashes or single digits. - If request is for dental extraction, please clarify the # of teeth to be extracted.  Request for surgical clearance:  1. What type of surgery is being performed? Transurethral Resection of bladder Tumor with Gemcitabine Instillation   2. When is this surgery scheduled? 08/12/2020   3. What type of clearance is required (medical clearance vs. Pharmacy clearance to hold med vs. Both)? Medical  4. Are there any medications that need to be held prior to surgery and how long?Aspirin for 5 days   5. Practice name and name of physician performing surgery? [facility]   6. What is the office phone number? [phone]   7.   What is the office fax number? [phone]  8.   Anesthesia type (None, local, MAC, general) ? unknown   Barbaraann Barthel 08/06/2020, 4:10 PM  _________________________________________________________________   (provider comments below)

## 2020-08-07 ENCOUNTER — Other Ambulatory Visit: Payer: Self-pay

## 2020-08-07 ENCOUNTER — Ambulatory Visit (INDEPENDENT_AMBULATORY_CARE_PROVIDER_SITE_OTHER): Payer: Medicare Other | Admitting: Physician Assistant

## 2020-08-07 ENCOUNTER — Encounter: Payer: Self-pay | Admitting: Physician Assistant

## 2020-08-07 VITALS — BP 138/74 | HR 62 | Ht 69.0 in | Wt 189.8 lb

## 2020-08-07 DIAGNOSIS — I10 Essential (primary) hypertension: Secondary | ICD-10-CM

## 2020-08-07 DIAGNOSIS — Z01810 Encounter for preprocedural cardiovascular examination: Secondary | ICD-10-CM

## 2020-08-07 DIAGNOSIS — Z951 Presence of aortocoronary bypass graft: Secondary | ICD-10-CM

## 2020-08-07 DIAGNOSIS — I2510 Atherosclerotic heart disease of native coronary artery without angina pectoris: Secondary | ICD-10-CM

## 2020-08-07 DIAGNOSIS — N529 Male erectile dysfunction, unspecified: Secondary | ICD-10-CM

## 2020-08-07 MED ORDER — TRIAMTERENE-HCTZ 37.5-25 MG PO TABS: ORAL_TABLET | ORAL | 6 refills | Status: DC

## 2020-08-07 MED ORDER — SIMVASTATIN 40 MG PO TABS: 40.0000 mg | ORAL_TABLET | Freq: Every day | ORAL | 3 refills | Status: DC

## 2020-08-07 MED ORDER — CARVEDILOL 6.25 MG PO TABS: 6.2500 mg | ORAL_TABLET | Freq: Two times a day (BID) | ORAL | 3 refills | Status: DC

## 2020-08-07 MED ORDER — SILDENAFIL CITRATE 20 MG PO TABS: 20.0000 mg | ORAL_TABLET | Freq: Every day | ORAL | 3 refills | Status: DC | PRN

## 2020-08-07 NOTE — Progress Notes (Signed)
Cardiology Office Note   Date:  08/07/2020   ID:  Hunter [name], DOB 1946-07-03, MRN [id number]  PCP:  Thea Alken Cardiologist:  Sanda Klein, MD 08/23/2016 Electrphysiologist: None  Rosaria Ferries, PA-C   No chief complaint on file.   History of Present Illness: Hunter [name] is a 74 y.o. male with a history of CAD s/p CABG x4 (LIMA-LAD; VG-OM1, OM2, VG-PD, 2951), complicated by post-op Afib (previously on amio, sotalol, now on lopressor), HTN, HLD, CKD stage III, and GERD.   08/15/2019 office visit, pt had moved to [place], [place]. Need labs from PCP. Plays golf, no ischemic sx, f/u 1 yr  08/10 PCP visit, ECG was abnl and pt needs cardiac clearance for surgery >> appt made  Hunter [name] presents for cardiology follow up.   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 He needs a surgical procedure, followed by BCG therapy q week.  Dr. Claudia Desanctis with [facility]  Plays golf 4-5 x week. Rides a cart.   Rare CP, had a twinge on his upper R chest one time, ?pulled muscle.  Does not wake w/ LE edema, some daytime edema on R. He denies orthopnea or PND.   Denies palpitations, no presyncope or syncope.   Still works at the bank 1 day a week, has to climb stairs. No problems w/ this.  No DOE.   Needs sildenafil.   Past Medical History:  Diagnosis Date   Atrial fibrillation, rapid ([employer])    With high ventricular rates. Intol amio, was on Sotalol, now on Lopressor   Bursitis of right shoulder    CAD (coronary artery disease) 11/2012   CABG x 4 using left internal mammary artery and right endovein harvest. (LIMA-LAD; VG-OM1, OM2, VG-PD), EF 55-60% at cath   Degenerative disc disease, lumbar    Epigastric pain    Myoview 11/09/12 Showed evidence of reversible ischemia in both the inferior wall & the anteroapical distribution.    Erectile dysfunction    H/O ED while taking a beta-blocker   GERD (gastroesophageal reflux disease)    Gout    Hiatal hernia     Hyperlipidemia    Hypertension    Systemic HTN. Pt has H/O of ED when taking a beta-blocker. ECHO 11/26/08 Showed no evidence of any significant valvular disease, Estimated EF = 50-60%.   Kidney stone    " i have  6-7 stones imbedded in my left kidney "    Mononeuritis of unspecified site    Nocturia    NSVT (nonsustained ventricular tachycardia) (HCC)    Obesity    Osteoarthritis 08/2019   right knee; cortisone shot    PAF (paroxysmal atrial fibrillation) ([place])    Post CABG. Amia intol. 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 (LIMA-LAD; VG-OM1, OM2, VG-PD)   EYE SURGERY     had  lasik surgery bilaterally 1996   LEFT HEART CATHETERIZATION WITH CORONARY ANGIOGRAM N/A 12/08/2012   Procedure: LEFT HEART CATHETERIZATION WITH CORONARY ANGIOGRAM;  Surgeon: Sanda Klein, MD;  Location: [facility];  Service: Cardiovascular;  Laterality: N/A;   SHOULDER SURGERY     for effects of polio   SKIN CANCER EXCISION  2016   left side of face    TRANSURETHRAL RESECTION OF BLADDER TUMOR N/A 11/16/2019   Procedure: TRANSURETHRAL RESECTION OF BLADDER TUMOR (TURBT) WITH INSTILLATION OF POST OPERATIVE CHEMOTHERAPY;  Surgeon: Kathie Rhodes, MD;  Location: [facility];  Service: Urology;  Laterality: N/A;   UPPER GI ENDOSCOPY      Current Outpatient Medications  Medication Sig Dispense Refill   allopurinol (ZYLOPRIM) 300 MG tablet Take 300 mg by mouth  daily.     carvedilol (COREG) 6.25 MG tablet Take 1 tablet (6.25 mg total) by mouth 2 (two) times daily with a meal. 180 tablet 3   naproxen sodium (ALEVE) 220 MG tablet Take 440 mg by mouth daily as needed (pain).     Omega-3 Fatty Acids (FISH OIL) 1200 MG CAPS Take 1,200 mg by mouth daily.     omeprazole (PRILOSEC) 20 MG capsule Take 20 mg by mouth every Monday, Wednesday, and Friday.      simvastatin (ZOCOR) 40 MG tablet Take 1 tablet (40 mg total) by mouth daily at 6 PM. 90 tablet 3   triamterene-hydrochlorothiazide (MAXZIDE-25) 37.5-25 MG tablet TAKE 1 TABLET BY MOUTH 4 TIMES A WEEK (Patient taking differently: Take 1 tablet by mouth See admin instructions. Take 1 tablet on Tues, Thurs, Sat, and Sun) 12 tablet 3   No current facility-administered medications for this visit.   Facility-Administered Medications Ordered in Other Visits  Medication Dose Route Frequency Provider Last Rate Last Admin   gemcitabine (GEMZAR) chemo syringe for bladder instillation 2,000 mg  2,000 mg Bladder Instillation Once Robley Fries, MD        Allergies:   Amiodarone and Oxycodone    Social History:  The patient  reports that he has never smoked. He has never used smokeless tobacco. He reports current alcohol use. He reports that he does not use drugs.   Family History:  The patient's family history includes AAA (abdominal aortic aneurysm) in his father; Aneurysm in his father; CAD in his father; Cancer in his mother; Heart attack in his paternal grandfather; Hypertension in his father.  He indicated that his mother is deceased. He indicated that his father is deceased. He indicated that his sister is alive. He indicated that his brother is alive. He indicated that his maternal grandmother is deceased. He indicated that his maternal grandfather is deceased. He indicated that his paternal grandmother is deceased. He indicated that his paternal grandfather is deceased. He indicated that all of his  three children are alive.    ROS:  Please see the history of present illness. All other systems are reviewed and negative.    PHYSICAL EXAM: VS:  BP 138/74    Pulse 62    Ht 5\' 9"  (1.753 m)    Wt 189 lb 12.8 oz (86.1 kg)    SpO2 97%    BMI 28.03 kg/m  , BMI Body mass index is 28.03 kg/m. GEN: Well nourished, well developed, male in no acute distress HEENT: normal for age  Neck: no JVD, no carotid bruit, no masses Cardiac: RRR; no murmur, no rubs, or gallops Respiratory:  clear to auscultation bilaterally, normal work of breathing GI:  soft, nontender, nondistended, + BS MS: no deformity or atrophy; no edema; distal pulses are 2+ in all 4 extremities  Skin: warm and dry, no rash Neuro:  Strength and sensation are intact Psych: euthymic mood, full affect   EKG:  EKG is ordered today. The ekg ordered today demonstrates sinus rhythm, heart rate 62, inferior Q waves that are unchanged from a previous ECG  ECHO: None in system  CATH: 2013, none since CABG Angiographic Findings:  1. The left main coronary artery is free of significant atherosclerosis and bifurcates in the usual fashion into the left anterior descending artery and left circumflex coronary artery.  2. The left anterior descending artery is a large vessel that reaches the apex and generates only one major diagonal branch. There is evidence of an ulcerated plaque with 40% stenosis in the proximal LAD and there is a 95% stenosis in the mid vessel beyond the diagonal ostium. The remainder of the vessel is relatively free of luminal irregularities and calcification. 3. The left circumflex coronary artery is a large-size vessel non-dominant vessel that generates three major oblique marginal arteries, the proximal one being much smaller. There is evidence of a severe, ulcerated stenosis just beyond the second OM artery takeoff. The left atrial branch and the AV groove vessel provide collaterals to the distal branches of the right  coronary artery. 4. The right coronary artery is a relatively small-size, but dominant vessel that has a 95% distal stenosis at the acute margin. There is a small to medium size posterior descending artery and an ostially occluded posterior lateral ventricular artery (which fills during left coronary injections). 5. The left ventricle is normal in size. The left ventricle systolic function is normalwith an estimated ejection fraction of 55-60%. Regional wall motion abnormalities are not seen. No left ventricular thrombus is seen. There is no mitral insufficiency. The ascending aorta appears normal. There is no aortic valve stenosis by pullback. The left ventricular end-diastolic pressure is 11 mm Hg.    IMPRESSIONS:  Symptomatic multivessel severe CAD, with preserved LV systolic function.    ETT: 2016  There was no ST segment deviation noted during stress.  No T wave inversion was noted during stress.  The patient reported shortness of breath during the stress test. The patient experienced no angina during the stress test.  [facility] Treadmill Score: low risk   Negative stress test without evidence of ischemia at given workload.  Recent Labs: 11/14/2019: BUN 22; Creatinine, Ser 1.47; Hemoglobin 15.4; Platelets 239; Potassium 3.6; Sodium 137  CBC    Component Value Date/Time   WBC 7.8 11/14/2019 1203   RBC 5.00 11/14/2019 1203   HGB 15.4 11/14/2019 1203   HCT 45.9 11/14/2019 1203   PLT 239 11/14/2019 1203   MCV 91.8 11/14/2019 1203   MCH 30.8 11/14/2019 1203   MCHC 33.6 11/14/2019 1203   RDW 13.3 11/14/2019 1203   LYMPHSABS 1.5 10/26/2010 1056   MONOABS 0.7 10/26/2010 1056   EOSABS 0.3 10/26/2010 1056   BASOSABS 0.1 10/26/2010 1056   CMP Latest Ref Rng & Units 11/14/2019 08/24/2019 08/15/2019  Glucose 70 - 99 mg/dL 102(H) 87 91  BUN 8 - 23 mg/dL 22 36(H) 35(H)  Creatinine 0.61 - 1.24 mg/dL 1.47(H) 1.50(H) 1.37(H)  Sodium 135 - 145 mmol/L 137 142 141  Potassium 3.5 - 5.1 mmol/L  3.6 3.9 4.0  Chloride 98 - 111 mmol/L 99 102 101  CO2 22 - 32 mmol/L 27 24 23   Calcium 8.9 - 10.3 mg/dL  9.3 9.5 9.4  Total Protein 6.0 - 8.3 g/dL - - -  Total Bilirubin 0.2 - 1.2 mg/dL - - -  Alkaline Phos 39 - 117 U/L - - -  AST 0 - 37 U/L - - -  ALT 0 - 53 U/L - - -     Lipid Panel Lab Results  Component Value Date   CHOL 128 09/26/2014   HDL 46 09/26/2014   LDLCALC 53 09/26/2014   LDLDIRECT 83.9 10/26/2010   TRIG 146 09/26/2014   CHOLHDL 2.8 09/26/2014      Wt Readings from Last 3 Encounters:  08/07/20 189 lb 12.8 oz (86.1 kg)  11/16/19 179 lb (81.2 kg)  11/14/19 179 lb (81.2 kg)     Other studies Reviewed: Additional studies/ records that were reviewed today include: Office notes, hospital records and testing.  ASSESSMENT AND PLAN:  1.  Preoperative cardiovascular evaluation -[name] has a good activity level at baseline with no ischemic symptoms. -His RCRI score is 1 giving him a 0.9% perioperative risk of major cardiac event -His DASI score is good at 37.5 giving him a functional capacity in METS of 7.34 -He is at acceptable risk for the planned procedure with no further cardiac work-up  2.  CAD s/p CABG: - His bypass surgery was in 2013 -Last stress test was in 2016 -He is not having any ischemic symptoms, good activity level. -He does not have aspirin on his med list, we will clarify this.  If he is on aspirin, it is okay to hold it for the procedure.  If he is not on aspirin, he should start it after the procedure. -Continue beta-blocker and statin -His ECG from his PCP office was reviewed and is consistent with his ECG from 2020 and the ECG from today. -Dr. [name] to review and advise if he needs a stress test prior to his appointment in 1 year  3.  Hyperlipidemia, goal LDL less than 70, labs from PCP -We received labs from his PCP.  BUN 24/creatinine 1.41 (his baseline) -Hemoglobin 13.9, hematocrit 42.6, platelets 185 Total cholesterol 142,  triglycerides 158, HDL 50, LDL 60 -LFTs within normal limits, TSH 1.96 -no med changes, follow-up with PCP  4.  Hypertension: -His blood pressure is a little above target today. -He states that his blood pressure at home normally runs less than it is today. -I explained that his target was 130/80 -Continue carvedilol and Maxide at the current doses -Keep a blood pressure record and let us know if his home blood pressures are running higher than 130/80  5.  ED: -He does not currently have a prescription for nitroglycerin -As he is not having any ischemic symptoms, it is okay for him to use sildenafil as needed   Current medicines are reviewed at length with the patient today.  The patient does not have concerns regarding medicines.  The following changes have been made:  no change  Labs/ tests ordered today include:  No orders of the defined types were placed in this encounter.    Disposition:   FU with Sanda Klein, MD in 1 year  Signed, Rosaria Ferries, PA-C  08/07/2020 9:13 AM    [place] Phone: [phone]; Fax: [phone]

## 2020-08-07 NOTE — Telephone Encounter (Signed)
Left message to call back  

## 2020-08-07 NOTE — Patient Instructions (Signed)
Medication Instructions:   START Revatio (Sildenafil) 20 mg---take 1-3 tablets daily as needed  *If you need a refill on your cardiac medications before your next appointment, please call your pharmacy*    Testing/Procedures: You have been cleared for your procedure. We will send today's note to Dr. Claudia Desanctis.   Follow-Up: At [facility], you and your health needs are our priority.  As part of our continuing mission to provide you with exceptional heart care, we have created designated Provider Care Teams.  These Care Teams include your primary Cardiologist (physician) and Advanced Practice Providers (APPs -  Physician Assistants and Nurse Practitioners) who all work together to provide you with the care you need, when you need it.  We recommend signing up for the patient portal called "MyChart".  Sign up information is provided on this After Visit Summary.  MyChart is used to connect with patients for Virtual Visits (Telemedicine).  Patients are able to view lab/test results, encounter notes, upcoming appointments, etc.  Non-urgent messages can be sent to your provider as well.   To learn more about what you can do with MyChart, go to [email or web address].    Your next appointment:   12 month(s)  The format for your next appointment:   In Person  Provider:   You may see Sanda Klein, MD or one of the following Advanced Practice Providers on your designated Care Team:    Almyra Deforest, PA-C  Fabian Sharp, [place] or   Roby Lofts, [place]    Other Instructions Please call our office 2 onths in advance to schedule your follow-up appointment with Dr. Sallyanne Kuster.  Your physician has requested that you regularly monitor and record your blood pressure readings at home. Please use the same machine at the same time of day to check your readings and record them to bring to your follow-up visit.

## 2020-08-07 NOTE — Progress Notes (Signed)
DUE TO COVID-19 ONLY ONE VISITOR IS ALLOWED TO COME WITH YOU AND STAY IN THE WAITING ROOM ONLY DURING PRE OP AND PROCEDURE DAY OF SURGERY. THE 1 VISITOR  MAY VISIT WITH YOU AFTER SURGERY IN YOUR PRIVATE ROOM DURING VISITING HOURS ONLY!  YOU NEED TO HAVE A COVID 19 TEST ON__8/13/2021_____ @_______ , THIS TEST MUST BE DONE BEFORE SURGERY,  COVID TESTING SITE [address] [place] [address], IT IS ON THE RIGHT GOING OUT [address] APPROXIMATELY  2 MINUTES PAST [employer] ON THE RIGHT. ONCE YOUR COVID TEST IS COMPLETED,  PLEASE BEGIN THE QUARANTINE INSTRUCTIONS AS OUTLINED IN YOUR HANDOUT.                Hunter [name]  08/07/2020   Your procedure is scheduled on:  08/12/2020   Report to [facility] Main  Entrance   Report to admitting at     [place] AM     Call this number if you have problems the morning of surgery [phone]    Remember: Do not eat food or drink liquids :After Midnight. BRUSH YOUR TEETH MORNING OF SURGERY AND RINSE YOUR MOUTH OUT, NO CHEWING GUM CANDY OR MINTS.     Take these medicines the morning of surgery with A SIP OF WATER:  Allopurinol, coreg                                 You may not have any metal on your body including hair pins and              piercings  Do not wear jewelry, make-up, lotions, powders or perfumes, deodorant             Do not wear nail polish on your fingernails.  Do not shave  48 hours prior to surgery.              Men may shave face and neck.   Do not bring valuables to the hospital. [place].  Contacts, dentures or bridgework may not be worn into surgery.  Leave suitcase in the car. After surgery it may be brought to your room.     Patients discharged the day of surgery will not be allowed to drive home. IF YOU ARE HAVING SURGERY AND GOING HOME THE SAME DAY, YOU MUST HAVE AN ADULT TO DRIVE YOU HOME AND BE WITH YOU FOR 24 HOURS. YOU MAY GO HOME BY TAXI  OR UBER OR ORTHERWISE, BUT AN ADULT MUST ACCOMPANY YOU HOME AND STAY WITH YOU FOR 24 HOURS.  Name and phone number of your driver:               Please read over the following fact sheets you were given: _____________________________________________________________________             [facility] - Preparing for Surgery Before surgery, you can play an important role.  Because skin is not sterile, your skin needs to be as free of germs as possible.  You can reduce the number of germs on your skin by washing with CHG (chlorahexidine gluconate) soap before surgery.  CHG is an antiseptic cleaner which kills germs and bonds with the skin to continue killing germs even after washing. Please DO NOT use if you have an allergy to CHG or antibacterial  soaps.  If your skin becomes reddened/irritated stop using the CHG and inform your nurse when you arrive at Short Stay. Do not shave (including legs and underarms) for at least 48 hours prior to the first CHG shower.  You may shave your face/neck. Please follow these instructions carefully:  1.  Shower with CHG Soap the night before surgery and the  morning of Surgery.  2.  If you choose to wash your hair, wash your hair first as usual with your  normal  shampoo.  3.  After you shampoo, rinse your hair and body thoroughly to remove the  shampoo.                           4.  Use CHG as you would any other liquid soap.  You can apply chg directly  to the skin and wash                       Gently with a scrungie or clean washcloth.  5.  Apply the CHG Soap to your body ONLY FROM THE NECK DOWN.   Do not use on face/ open                           Wound or open sores. Avoid contact with eyes, ears mouth and genitals (private parts).                       Wash face,  Genitals (private parts) with your normal soap.             6.  Wash thoroughly, paying special attention to the area where your surgery  will be performed.  7.  Thoroughly rinse your body with  warm water from the neck down.  8.  DO NOT shower/wash with your normal soap after using and rinsing off  the CHG Soap.                9.  Pat yourself dry with a clean towel.            10.  Wear clean pajamas.            11.  Place clean sheets on your bed the night of your first shower and do not  sleep with pets. Day of Surgery : Do not apply any lotions/deodorants the morning of surgery.  Please wear clean clothes to the hospital/surgery center.  FAILURE TO FOLLOW THESE INSTRUCTIONS MAY RESULT IN THE CANCELLATION OF YOUR SURGERY PATIENT SIGNATURE_________________________________  NURSE SIGNATURE__________________________________  ________________________________________________________________________

## 2020-08-08 ENCOUNTER — Encounter (HOSPITAL_COMMUNITY): Payer: Self-pay

## 2020-08-08 ENCOUNTER — Encounter (HOSPITAL_COMMUNITY)
Admission: RE | Admit: 2020-08-08 | Discharge: 2020-08-08 | Disposition: A | Discharge status: Home / Self Care | Payer: Medicare Other | Source: Ambulatory Visit | Attending: Urology | Admitting: Urology

## 2020-08-08 ENCOUNTER — Other Ambulatory Visit: Payer: Self-pay

## 2020-08-08 ENCOUNTER — Other Ambulatory Visit (HOSPITAL_COMMUNITY)
Admission: RE | Admit: 2020-08-08 | Discharge: 2020-08-08 | Disposition: A | Discharge status: Home / Self Care | Payer: Medicare Other | Source: Ambulatory Visit | Attending: Urology | Admitting: Urology

## 2020-08-08 DIAGNOSIS — Z01812 Encounter for preprocedural laboratory examination: Secondary | ICD-10-CM | POA: Insufficient documentation

## 2020-08-08 DIAGNOSIS — Z20822 Contact with and (suspected) exposure to covid-19: Secondary | ICD-10-CM | POA: Diagnosis not present

## 2020-08-08 LAB — BASIC METABOLIC PANEL
Anion gap: 8 (ref 5–15)
BUN: 16 mg/dL (ref 8–23)
CO2: 29 mmol/L (ref 22–32)
Calcium: 9 mg/dL (ref 8.9–10.3)
Chloride: 103 mmol/L (ref 98–111)
Creatinine, Ser: 1.35 mg/dL — ABNORMAL HIGH (ref 0.61–1.24)
GFR calc Af Amer: 60 mL/min — ABNORMAL LOW (ref >60)
GFR calc non Af Amer: 52 mL/min — ABNORMAL LOW (ref >60)
Glucose, Bld: 106 mg/dL — ABNORMAL HIGH (ref 70–99)
Potassium: 3.7 mmol/L (ref 3.5–5.1)
Sodium: 140 mmol/L (ref 135–145)

## 2020-08-08 LAB — CBC
HCT: 44.4 % (ref 39.0–52.0)
Hemoglobin: 14.8 g/dL (ref 13.0–17.0)
MCH: 30.9 pg (ref 26.0–34.0)
MCHC: 33.3 g/dL (ref 30.0–36.0)
MCV: 92.7 fL (ref 80.0–100.0)
Platelets: 172 10*3/uL (ref 150–400)
RBC: 4.79 MIL/uL (ref 4.22–5.81)
RDW: 13.6 % (ref 11.5–15.5)
WBC: 6.7 10*3/uL (ref 4.0–10.5)
nRBC: 0 % (ref 0.0–0.2)

## 2020-08-08 LAB — SARS CORONAVIRUS 2 (TAT 6-24 HRS): SARS Coronavirus 2: NEGATIVE

## 2020-08-08 NOTE — Pre-Procedure Instructions (Signed)
COVID Vaccine Completed: Yes Date COVID Vaccine completed: 02/21/2020 COVID vaccine manufacturer: Levan Hurst    PCP -  FNP Richardean Chimera  Cardiologist - Dr. Sallyanne Kuster  Chest x-ray -  EKG - 08/07/2020 Stress Test - 07/2011 ECHO -  Cardiac Cath - 11/2012  Sleep Study -  CPAP -   Fasting Blood Sugar -  Checks Blood Sugar _____ times a day  Blood Thinner Instructions: Aspirin Instructions: Last Dose:  Anesthesia review:   Patient denies shortness of breath, fever, cough and chest pain at PAT appointment   Patient verbalized understanding of instructions that were given to them at the PAT appointment. Patient was also instructed that they will need to review over the PAT instructions again at home before surgery.

## 2020-08-08 NOTE — Progress Notes (Signed)
Thanks, Rho

## 2020-08-11 NOTE — Telephone Encounter (Signed)
° °  Primary Cardiologist: Sanda Klein, MD  Chart reviewed as part of pre-operative protocol coverage. Given past medical history and time since last visit, based on ACC/AHA guidelines, Reagen Haberman would be at acceptable risk for the planned procedure without further cardiovascular testing.   His aspirin may be held for 5 days prior to his procedure.  Please resume as soon as hemostasis is achieved.  I will route this recommendation to the requesting party via Epic fax function and remove from pre-op pool.  Please call with questions.  Jossie Ng. Anthonee Gelin NP-C    08/11/2020, 8:59 AM [address] Office [phone] Fax [phone]

## 2020-08-11 NOTE — Anesthesia Preprocedure Evaluation (Addendum)
Anesthesia Evaluation  Patient identified by MRN, date of birth, ID band Patient awake    Reviewed: Allergy & Precautions, NPO status , Patient's Chart, lab work & pertinent test results, reviewed documented beta blocker date and time   Airway Mallampati: II  TM Distance: >3 FB Neck ROM: Full    Dental no notable dental hx. (+) Poor Dentition, Missing, Dental Advisory Given,    Pulmonary neg pulmonary ROS,    Pulmonary exam normal breath sounds clear to auscultation       Cardiovascular METS: 3 - Mets hypertension, Pt. on medications and Pt. 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, residual L arm atrophy  Neuromuscular disease negative psych ROS   GI/Hepatic Neg liver ROS, hiatal hernia, GERD  Medicated and Controlled,  Endo/Other  negative endocrine ROS  Renal/GU Renal Insufficiency and CRFRenal diseaseCr 1.35   Bladder cancer recurrence    Musculoskeletal  (+) Arthritis , Osteoarthritis,    Abdominal Normal abdominal exam  (+)   Peds  Hematology negative hematology ROS (+)   Anesthesia Other Findings   Reproductive/Obstetrics negative OB ROS                            Anesthesia Physical Anesthesia Plan  ASA: III  Anesthesia Plan: General   Post-op Pain Management:    Induction: Intravenous  PONV Risk Score and Plan: 2 and Ondansetron, Dexamethasone and Treatment may vary due to age or medical condition  Airway Management Planned: LMA  Additional Equipment: None  Intra-op Plan:   Post-operative Plan: Extubation in OR  Informed Consent: I have reviewed the patients History and Physical, chart, labs and discussed the procedure including the risks, benefits and alternatives for the proposed anesthesia with  the patient or authorized representative who has indicated his/her understanding and acceptance.     Dental advisory given  Plan Discussed with: CRNA  Anesthesia Plan Comments:        Anesthesia Quick Evaluation

## 2020-08-12 ENCOUNTER — Ambulatory Visit (HOSPITAL_COMMUNITY): Payer: Medicare Other | Admitting: Anesthesiology

## 2020-08-12 ENCOUNTER — Other Ambulatory Visit: Payer: Self-pay

## 2020-08-12 ENCOUNTER — Encounter (HOSPITAL_COMMUNITY)
Admission: RE | Disposition: A | Discharge status: Home / Self Care | Payer: Self-pay | Source: Home / Self Care | Attending: Urology

## 2020-08-12 ENCOUNTER — Ambulatory Visit (HOSPITAL_COMMUNITY)
Admission: RE | Admit: 2020-08-12 | Discharge: 2020-08-12 | Disposition: A | Discharge status: Home / Self Care | Payer: Medicare Other | Attending: Urology | Admitting: Urology

## 2020-08-12 ENCOUNTER — Encounter (HOSPITAL_COMMUNITY): Payer: Self-pay | Admitting: Urology

## 2020-08-12 DIAGNOSIS — E291 Testicular hypofunction: Secondary | ICD-10-CM | POA: Insufficient documentation

## 2020-08-12 DIAGNOSIS — Z79899 Other long term (current) drug therapy: Secondary | ICD-10-CM | POA: Diagnosis not present

## 2020-08-12 DIAGNOSIS — R3911 Hesitancy of micturition: Secondary | ICD-10-CM | POA: Diagnosis not present

## 2020-08-12 DIAGNOSIS — N281 Cyst of kidney, acquired: Secondary | ICD-10-CM | POA: Insufficient documentation

## 2020-08-12 DIAGNOSIS — N401 Enlarged prostate with lower urinary tract symptoms: Secondary | ICD-10-CM | POA: Insufficient documentation

## 2020-08-12 DIAGNOSIS — Z951 Presence of aortocoronary bypass graft: Secondary | ICD-10-CM | POA: Diagnosis not present

## 2020-08-12 DIAGNOSIS — Z85820 Personal history of malignant melanoma of skin: Secondary | ICD-10-CM | POA: Insufficient documentation

## 2020-08-12 DIAGNOSIS — D494 Neoplasm of unspecified behavior of bladder: Secondary | ICD-10-CM | POA: Diagnosis present

## 2020-08-12 DIAGNOSIS — Z885 Allergy status to narcotic agent status: Secondary | ICD-10-CM | POA: Diagnosis not present

## 2020-08-12 DIAGNOSIS — C679 Malignant neoplasm of bladder, unspecified: Secondary | ICD-10-CM | POA: Insufficient documentation

## 2020-08-12 HISTORY — PX: TRANSURETHRAL RESECTION OF BLADDER TUMOR WITH MITOMYCIN-C: SHX6459

## 2020-08-12 SURGERY — TRANSURETHRAL RESECTION OF BLADDER TUMOR WITH MITOMYCIN-C
Anesthesia: General | Site: Bladder

## 2020-08-12 MED ORDER — GEMCITABINE CHEMO FOR BLADDER INSTILLATION 2000 MG
2000.0000 mg | Freq: Once | INTRAVENOUS | Status: AC
  Administered 2020-08-12: 2000 mg via INTRAVESICAL
  Filled 2020-08-12: qty 2000

## 2020-08-12 MED ORDER — ACETAMINOPHEN 500 MG PO TABS
1000.0000 mg | ORAL_TABLET | Freq: Once | ORAL | Status: AC
  Administered 2020-08-12: 1000 mg via ORAL
  Filled 2020-08-12: qty 2

## 2020-08-12 MED ORDER — FENTANYL CITRATE (PF) 100 MCG/2ML IJ SOLN
INTRAMUSCULAR | Status: AC
  Filled 2020-08-12: qty 2

## 2020-08-12 MED ORDER — PROPOFOL 10 MG/ML IV BOLUS
INTRAVENOUS | Status: AC
  Filled 2020-08-12: qty 20

## 2020-08-12 MED ORDER — CIPROFLOXACIN IN D5W 400 MG/200ML IV SOLN
400.0000 mg | INTRAVENOUS | Status: AC
  Administered 2020-08-12: 400 mg via INTRAVENOUS
  Filled 2020-08-12: qty 200

## 2020-08-12 MED ORDER — ONDANSETRON HCL 4 MG/2ML IJ SOLN: 4.0000 mg | Freq: Once | INTRAMUSCULAR | Status: DC | PRN

## 2020-08-12 MED ORDER — ORAL CARE MOUTH RINSE: 15.0000 mL | Freq: Once | OROMUCOSAL | Status: AC

## 2020-08-12 MED ORDER — TAMSULOSIN HCL 0.4 MG PO CAPS: 0.4000 mg | ORAL_CAPSULE | Freq: Every day | ORAL | 0 refills | Status: DC

## 2020-08-12 MED ORDER — FENTANYL CITRATE (PF) 100 MCG/2ML IJ SOLN
INTRAMUSCULAR | Status: DC | PRN
  Administered 2020-08-12: 25 ug via INTRAVENOUS
  Administered 2020-08-12: 50 ug via INTRAVENOUS

## 2020-08-12 MED ORDER — DEXAMETHASONE SODIUM PHOSPHATE 10 MG/ML IJ SOLN
INTRAMUSCULAR | Status: DC | PRN
Start: 2020-08-12 — End: 2020-08-12
  Administered 2020-08-12: 10 mg via INTRAVENOUS

## 2020-08-12 MED ORDER — LIDOCAINE 2% (20 MG/ML) 5 ML SYRINGE
INTRAMUSCULAR | Status: DC | PRN
  Administered 2020-08-12: 60 mg via INTRAVENOUS

## 2020-08-12 MED ORDER — SODIUM CHLORIDE 0.9 % IR SOLN
Status: DC | PRN
  Administered 2020-08-12: 9000 mL via INTRAVESICAL

## 2020-08-12 MED ORDER — CHLORHEXIDINE GLUCONATE 0.12 % MT SOLN
15.0000 mL | Freq: Once | OROMUCOSAL | Status: AC
  Administered 2020-08-12: 15 mL via OROMUCOSAL

## 2020-08-12 MED ORDER — ONDANSETRON HCL 4 MG/2ML IJ SOLN
INTRAMUSCULAR | Status: AC
  Filled 2020-08-12: qty 2

## 2020-08-12 MED ORDER — PHENYLEPHRINE HCL (PRESSORS) 10 MG/ML IV SOLN
INTRAVENOUS | Status: DC | PRN
Start: 2020-08-12 — End: 2020-08-12
  Administered 2020-08-12 (×3): 80 ug via INTRAVENOUS

## 2020-08-12 MED ORDER — LACTATED RINGERS IV SOLN: INTRAVENOUS | Status: DC

## 2020-08-12 MED ORDER — FENTANYL CITRATE (PF) 100 MCG/2ML IJ SOLN: 25.0000 ug | INTRAMUSCULAR | Status: DC | PRN

## 2020-08-12 MED ORDER — PROPOFOL 10 MG/ML IV BOLUS
INTRAVENOUS | Status: DC | PRN
  Administered 2020-08-12: 150 mg via INTRAVENOUS

## 2020-08-12 MED ORDER — DEXAMETHASONE SODIUM PHOSPHATE 10 MG/ML IJ SOLN
INTRAMUSCULAR | Status: AC
  Filled 2020-08-12: qty 1

## 2020-08-12 MED ORDER — ONDANSETRON HCL 4 MG/2ML IJ SOLN
INTRAMUSCULAR | Status: DC | PRN
  Administered 2020-08-12: 4 mg via INTRAVENOUS

## 2020-08-12 SURGICAL SUPPLY — 17 items
BAG DRN RND TRDRP ANRFLXCHMBR (UROLOGICAL SUPPLIES) ×1
BAG URINE DRAIN 2000ML AR STRL (UROLOGICAL SUPPLIES) ×1 IMPLANT
BAG URO CATCHER STRL LF (MISCELLANEOUS) ×2 IMPLANT
CATH FOLEY 2WAY SLVR  5CC 20FR (CATHETERS) ×2
CATH FOLEY 2WAY SLVR 5CC 20FR (CATHETERS) IMPLANT
CLOTH BEACON ORANGE TIMEOUT ST (SAFETY) ×2 IMPLANT
ELECT REM PT RETURN 15FT ADLT (MISCELLANEOUS) ×2 IMPLANT
GLOVE BIO SURGEON STRL SZ 6.5 (GLOVE) ×2 IMPLANT
GOWN STRL REUS W/TWL LRG LVL3 (GOWN DISPOSABLE) ×2 IMPLANT
KIT TURNOVER KIT A (KITS) IMPLANT
LOOP CUT BIPOLAR 24F LRG (ELECTROSURGICAL) IMPLANT
MANIFOLD NEPTUNE II (INSTRUMENTS) ×2 IMPLANT
PACK CYSTO (CUSTOM PROCEDURE TRAY) ×2 IMPLANT
SYR TOOMEY IRRIG 70ML (MISCELLANEOUS) ×2
SYRINGE TOOMEY IRRIG 70ML (MISCELLANEOUS) IMPLANT
TUBING CONNECTING 10 (TUBING) ×2 IMPLANT
TUBING UROLOGY SET (TUBING) ×2 IMPLANT

## 2020-08-12 NOTE — Op Note (Signed)
PATIENT:  Hunter [name]  PRE-OPERATIVE DIAGNOSIS: Bladder tumor recurrence  POST-OPERATIVE DIAGNOSIS: Same  PROCEDURE:  Procedure(s): 1. TRANSURETHRAL RESECTION OF BLADDER TUMOR (TURBT) (3 cm.) 2. Instillation of intravesical chemotherapy (Gemcitabine)  SURGEON:  Jacalyn Lefevre, MD  ANESTHESIA:   General  EBL:  minimal  DRAINS: Urethral catheter (20Fr. 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    Description of operation: The patient was taken to the operating room and administered general anesthesia. They were then placed on the table and moved to the dorsal lithotomy position after which the genitalia was sterilely prepped and draped. An official timeout was then performed.  The 63 French resectoscope with the 30 lens and visual obturator were then passed into the bladder under direct visualization. Urethra appeared normal. The visual obturator was then removed and the Gyrus resectoscope element with 30  lens was then inserted and the bladder was fully and systematically inspected. Ureteral orifices were noted to be in the normal anatomic positions.    I first began by resecting early papillary growth at trigone.  A second biopsy was taken from left prostatic lobe just proximal to verumontanum .   Reinspection of the bladder revealed all obvious tumor had been fully resected and there was no evidence of perforation. The toomey was then used to irrigate the bladder and remove all of the portions of bladder tumor which were sent to pathology. I then removed the resectoscope.  A 20 French Foley catheter was then inserted in the bladder and irrigated. The irrigant returned slightly pink with no clots. The patient was awakened and taken to the recovery room.  While in the recovery room 2 g of gemcitabine in 52.6 cc of  sterile water was instilled in the bladder through the catheter and the catheter was plugged. This will remain indwelling for approximately one hour. It will then be drained from the bladder and the catheter will be removed and the patient discharged home.  PLAN OF CARE: Discharge to home after PACU  PATIENT DISPOSITION:  PACU - hemodynamically stable.

## 2020-08-12 NOTE — Anesthesia Procedure Notes (Signed)
Procedure Name: LMA Insertion Date/Time: 08/12/2020 9:53 AM Performed by: West Pugh, CRNA Pre-anesthesia Checklist: Patient identified, Emergency Drugs available, Suction available, Patient being monitored and Timeout performed Patient Re-evaluated:Patient Re-evaluated prior to induction Oxygen Delivery Method: Circle system utilized Preoxygenation: Pre-oxygenation with 100% oxygen Induction Type: IV induction LMA: LMA with gastric port inserted LMA Size: 4.0 Number of attempts: 1 Placement Confirmation: positive ETCO2 and breath sounds checked- equal and bilateral Tube secured with: Tape Dental Injury: Teeth and Oropharynx as per pre-operative assessment

## 2020-08-12 NOTE — Interval H&P Note (Signed)
History and Physical Interval Note:  08/12/2020 8:45 AM  Hunter [name]  has presented today for surgery, with the diagnosis of BLADDER CANCER RECURRENCE.  The various methods of treatment have been discussed with the patient and family. After consideration of risks, benefits and other options for treatment, the patient has consented to  Procedure(s) with comments: TRANSURETHRAL RESECTION OF BLADDER TUMOR WITH GEMCITABINE (N/A) - 1 HR as a surgical intervention.  The patient's history has been reviewed, patient examined, no change in status, stable for surgery.  I have reviewed the patient's chart and labs.  Questions were answered to the patient's satisfaction.     Vianny Schraeder D Jeyren Danowski

## 2020-08-12 NOTE — Anesthesia Postprocedure Evaluation (Signed)
Anesthesia Post Note  Patient: Hunter [name]  Procedure(s) Performed: TRANSURETHRAL RESECTION OF BLADDER TUMOR WITH GEMCITABINE (N/A Bladder)     Patient location during evaluation: PACU Anesthesia Type: General Level of consciousness: awake and alert, oriented and patient cooperative Pain management: pain level controlled Vital Signs Assessment: post-procedure vital signs reviewed and stable Respiratory status: spontaneous breathing, nonlabored ventilation and respiratory function stable Cardiovascular status: blood pressure returned to baseline and stable Postop Assessment: no apparent nausea or vomiting Anesthetic complications: no   No complications documented.  Last Vitals:  Vitals:   08/12/20 1045 08/12/20 1100  BP: 128/77 138/81  Pulse: 82 68  Resp: 11 11  Temp:    SpO2: 100% 99%    Last Pain:  Vitals:   08/12/20 1100  TempSrc:   PainSc: 0-No pain                 Pervis Hocking

## 2020-08-12 NOTE — Discharge Instructions (Signed)

## 2020-08-12 NOTE — H&P (Signed)
CC/HPI: cc: surveillance cystoscopy   08/01/20: 74 year old man is a prior patient of Dr. Karsten Ro with history of pT1G3 s/p TURBT 11/16/2019. He completed 6 week induction course of BCG on 02/13/2020. Patient has been still experiencing gross hematuria for the last several weeks.      VITAL SIGNS:     08/01/2020 08:53 AM  Weight 187 lb / 84.82 kg  Height 69 in / 175.26 cm  BP 150/68 mmHg  Pulse 54 /min  Temperature 97.8 F / 36.5 C  BMI 27.6 kg/m   Complexity of Data:   Records Review:  POC Tool  Urine Test Review:  Urinalysis    10/17/19 10/02/18 10/03/17 10/22/16 11/04/15 10/29/14 10/31/13 10/01/11  PSA  Total PSA 4.41 ng/dl 4.58 ng/dl 4.28 ng/dl 5.27 ng/dl 4.26  3.50  3.64  4.30   Free PSA     1.32    1.64   % Free PSA     31    38     10/06/10 09/01/10  Hormones  Testosterone, Total 344.0  221.0    Notes:  11/14/2019: BUN 22, creatinine 1.47   PROCEDURES:    Flexible Cystoscopy - 52000  Risks, benefits, and some of the potential complications of the procedure were discussed at length with the patient including infection, bleeding, voiding discomfort, urinary retention, fever, chills, sepsis, and others. All questions were answered. Informed consent was obtained. Sterile technique and intraurethral analgesia were used.  Meatus:  Normal size. Normal location. Normal condition.  Urethra:  No strictures.  External Sphincter:  Normal.  Verumontanum:  Normal.  Prostate:  Non-obstructing. No hyperplasia.  Bladder Neck:  Non-obstructing.  Ureteral Orifices:  Normal location. Normal size. Normal shape. Effluxed clear urine.  Bladder:  Moderate trabeculation. At least 3 small papillary bladder tumors on the posterior wall. Visualization was difficult due to some blood. Findings are consistent with bladder cancer recurrence. No stones.      The lower urinary tract was carefully examined. The procedure was well-tolerated and without complications. Antibiotic instructions were  given. Instructions were given to call the office immediately for bloody urine, difficulty urinating, urinary retention, painful or frequent urination, fever, chills, nausea, vomiting or other illness. The patient stated that he understood these instructions and would comply with them.    Urinalysis w/Scope  Dipstick Dipstick Cont'd Micro  Color: Yellow Bilirubin: Neg mg/dL WBC/hpf: 6 - 10/hpf  Appearance: Slightly Cloudy Ketones: Neg mg/dL RBC/hpf: 40 - 60/hpf  Specific Gravity: 1.025 Blood: 3+ ery/uL Bacteria: Rare (0-9/hpf)  pH: 6.0 Protein: Trace mg/dL Cystals: NS (Not Seen)  Glucose: Neg mg/dL Urobilinogen: 0.2 mg/dL Casts: NS (Not Seen)   Nitrites: Neg Trichomonas: Not Present   Leukocyte Esterase: Trace leu/uL Mucous: Not Present    Epithelial Cells: 0 - 5/hpf    Yeast: NS (Not Seen)    Sperm: Not Present    ASSESSMENT:     ICD-10 Details  1 GU:  Bladder tumor/neoplasm - D41.4 Undiagnosed New Problem - Cystoscopy revealed bladder cancer recurrence. We discussed proceeding with a TURBT with postoperative gemcitabine. Patient understands the risks and benefits of the procedure including bleeding, need for Foley catheter, infection, bladder perforation, damage to surrounding structures, need for future treatment, recurrence. He will be scheduled for the next available surgery date.    ALLERGIES: OxyCONTIN TB12    MEDICATIONS: Allopurinol 300 mg tablet  Metoprolol Tartrate 25 mg tablet  Simvastatin 40 mg tablet  Aspir 81  Fish Oil  Pyridium 200 mg tablet  Triamterene-Hydrochlorothiazid  GU PSH: Bladder Instill AntiCA Agent - 02/13/2020, 02/06/2020, 01/23/2020, 01/16/2020, 01/09/2020, 01/02/2020, 11/16/2019 Cystoscopy - 11/05/2019 Cystoscopy TURBT <2 cm - 11/16/2019 Locm 300-399Mg /Ml Iodine,1Ml - 10/29/2019      PSH Notes: CABG (CABG), Shoulder Surgery   NON-GU PSH: CABG (coronary artery bypass grafting) - 2014      GU PMH: History of bladder cancer, One month to begin a  6 week induction course of BCG followed by surveillance cystoscopy in 3 months. - 11/26/2019 Bladder tumor/neoplasm, Found a papillary tumor on the floor of his bladder in the midline. He will be scheduled for surgery. - 11/05/2019 Renal cyst, Bilateral, We did discussed the finding of bilateral renal cysts and they are benign nature. - 11/05/2019 Microscopic hematuria, He has microscopic hematuria. We discussed the fact that he has never smoked nor been exposed to significant secondhand smoke but has had stones in the past. Regardless I told him that my recommendation is upper and lower tract evaluation in this situation. - 10/29/2019 Urinary Hesitancy (Stable), He has some mild hesitancy but he said it is not enough of a bother that he would want to consider any form of pharmacologic therapy. - 10/29/2019 BPH w/LUTS (Stable), He has BPH by exam. This is really unchanged but he has developed some mild voiding symptoms. - 10/31/2018 Elevated PSA (Stable), Has a history of an elevated PSA but I again noted that his prostate was smooth and benign. His PSA done last month was in line with his previous values at 4.58. I have recommended continued monitoring with repeat DRE and PSA again in 1 year. - 10/31/2018, (Stable), He has a history of an elevated PSA that has varied over time but has maintained an excellent free to total ratio. His current PSA is 4.28 and that has decreased from where was a year ago. His prostate is smooth and benign. What I have recommended is we check his PSA again in 6 months with repeat DRE and PSA again in 1 year., - 11/22/2017 (Stable), His PSA has increased slightly and I wanted to recheck it again in 6 months but it doesn't appear that occurred so as long as he has no infection I would consider rechecking a PSA if he remains symptomatic., - 2018 (Worsening), His prostate was free of any nodularity or induration but his PSA has risen slightly over the past year. He has maintained an  excellent free to total ratio in the past. We discussed the fact that prostatic enlargement can cause a rising PSA. My recommendation to begin to recheck his PSA again in 6 months and then again 12 months from now in order to monitor it more closely., - 2017, Elevated prostate specific antigen (PSA), - 2016 Nocturia, He is having some double voiding as well as nocturia x2 and have any significant obstructive symptoms and indicates that it is not bad enough that he would want to consider any form of pharmacologic therapy. - 10/31/2018 Urinary Frequency, I told him that I was unclear as to what has caused his acute voiding symptoms. I told him that he clearly has voiding symptoms but has no objective findings with completely clear urine now and no significant elevation of his PVR. With clear urine I think the likelihood of culture positivity is low but I have to rule out out with a culture and then have given him samples of Myrbetriq 25 mg to take for the next week. If he remains symptomatic further workup will be necessary. - 2018 BPH w/o LUTS,  Benign enlargement of prostate - 2016 ED due to arterial insufficiency, Erectile dysfunction due to arterial insufficiency - 2016 Low back pain, Acute left-sided low back pain without sciatica - 2016 Renal calculus, Kidney stone on left side - 2016 Gross hematuria, Gross Hematuria - 2014 History of urolithiasis, Nephrolithiasis - 2014 Personal Hx Oth male genital organs diseases, History of prostatitis - 2014 Primary hypogonadism, Hypogonadism, testicular - 2014     PMH Notes: Organic erectile dysfunction: He has used both Viagra and Cialis. He reports the Cialis no longer seems to be effective.he then was using Levitra but also intermittently was having difficulty achieving erection. We therefore switched to 5 mg Cialis for daily use.  07/07/2011 - He reported using the Viagra 100mg  in the AM when needed, and Levitra 20mg  at night when needed.  Current therapy:  Viagra/sildenafil   Bilateral renal calculi: These have remained stable and asymptomatic without any flank pain, hematuria or passage of any stones in some time.   Hypogonadism: He had a low normal serum testosterone of 221. We discussed the fact that he was symptomatic with decreased energy level and some memory issues. Because of that I told him I'd like to see if he noted improvement with testosterone replacement. His PSA has varied over time but his free to total ratio has always looked good however I did discuss with him the relationship between testosterone replacement and prostate cancer.  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  Has never drank.  Drinks 2 caffeinated drinks per day.    Notes: Never A Smoker, Caffeine Use, Marital History - Currently Married

## 2020-08-12 NOTE — Transfer of Care (Signed)
Immediate Anesthesia Transfer of Care Note  Patient: Hunter [name]  Procedure(s) Performed: TRANSURETHRAL RESECTION OF BLADDER TUMOR WITH GEMCITABINE (N/A Bladder)  Patient Location: PACU  Anesthesia Type:General  Level of Consciousness: drowsy and patient cooperative  Airway & Oxygen Therapy: Patient Spontanous Breathing and Patient connected to face mask oxygen  Post-op Assessment: Report given to RN and Post -op Vital signs reviewed and stable  Post vital signs: Reviewed and stable  Last Vitals:  Vitals Value Taken Time  BP    Temp    Pulse 78 08/12/20 1039  Resp 19 08/12/20 1039  SpO2 100 % 08/12/20 1039  Vitals shown include unvalidated device data.  Last Pain:  Vitals:   08/12/20 0807  TempSrc: Oral         Complications: No complications documented.

## 2020-08-13 ENCOUNTER — Encounter (HOSPITAL_COMMUNITY): Payer: Self-pay | Admitting: Urology

## 2020-08-13 LAB — SURGICAL PATHOLOGY

## 2020-08-22 ENCOUNTER — Telehealth: Payer: Self-pay | Admitting: Oncology

## 2020-08-22 NOTE — Telephone Encounter (Signed)
Received a new pt referral from Dr. Claudia Desanctis at [facility] for bladder cancer. Hunter [name] has been cld and scheduled to see Dr. Alen Blew on 9/8 at 11am. Pt aware to arrive 15 minutes early.

## 2020-09-03 ENCOUNTER — Other Ambulatory Visit: Payer: Self-pay

## 2020-09-03 ENCOUNTER — Inpatient Hospital Stay: Payer: Medicare Other | Attending: Oncology | Admitting: Oncology

## 2020-09-03 VITALS — BP 114/70 | HR 62 | Temp 97.0°F | Resp 18 | Ht 69.0 in | Wt 184.5 lb

## 2020-09-03 DIAGNOSIS — Z5111 Encounter for antineoplastic chemotherapy: Secondary | ICD-10-CM | POA: Insufficient documentation

## 2020-09-03 DIAGNOSIS — I4891 Unspecified atrial fibrillation: Secondary | ICD-10-CM

## 2020-09-03 DIAGNOSIS — Z85820 Personal history of malignant melanoma of skin: Secondary | ICD-10-CM | POA: Diagnosis not present

## 2020-09-03 DIAGNOSIS — C679 Malignant neoplasm of bladder, unspecified: Secondary | ICD-10-CM

## 2020-09-03 DIAGNOSIS — I2510 Atherosclerotic heart disease of native coronary artery without angina pectoris: Secondary | ICD-10-CM

## 2020-09-03 DIAGNOSIS — Z800 Family history of malignant neoplasm of digestive organs: Secondary | ICD-10-CM

## 2020-09-03 DIAGNOSIS — Z7189 Other specified counseling: Secondary | ICD-10-CM

## 2020-09-03 DIAGNOSIS — R319 Hematuria, unspecified: Secondary | ICD-10-CM | POA: Diagnosis not present

## 2020-09-03 MED ORDER — LIDOCAINE-PRILOCAINE 2.5-2.5 % EX CREA
1.0000 | TOPICAL_CREAM | CUTANEOUS | 0 refills | Status: DC | PRN
Start: 2020-09-03 — End: 2020-11-15

## 2020-09-03 MED ORDER — PROCHLORPERAZINE MALEATE 10 MG PO TABS
10.0000 mg | ORAL_TABLET | Freq: Four times a day (QID) | ORAL | 0 refills | Status: DC | PRN
Start: 2020-09-03 — End: 2023-02-28

## 2020-09-03 NOTE — Progress Notes (Signed)
Reason for the request:    Bladder cancer  HPI: I was asked by Dr. Claudia Desanctis to evaluate Hunter [name] for diagnosis of bladder cancer.  He is a 74 year old man with history of coronary disease, atrial fibrillation as well as bladder cancer.  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  At that time he was treated by Dr. Karsten Ro with a BCG.  He had developed a recurrent hematuria leading up to evaluation by Dr. Claudia Desanctis in August 2021.  He underwent TURBT and found to have a 3 cm tumor that was resected and intravesicular gemcitabine given at that time.  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  Based on these findings patient presented for evaluation regarding the role of neoadjuvant chemotherapy and consideration for radical cystectomy.  Clinically, he reports no major changes in his health.  He does report intermittent hematuria although not interfering with his quality of life.  He denies any dysuria or frequency.  Denies any flank pain or fevers.  Remains active without any decline in ability to do most activities of daily living.   He does not report any headaches, blurry vision, syncope or seizures. Does not report any fevers, chills or sweats.  Does not report any cough, wheezing or hemoptysis.  Does not report any chest pain, palpitation, orthopnea or leg edema.  Does not report any nausea, vomiting or abdominal pain.  Does not report any constipation or diarrhea.  Does not report any skeletal complaints.    Does not report frequency, urgency or hematuria.  Does not report any skin rashes or lesions. Does not report any heat or cold intolerance.  Does not report any lymphadenopathy or petechiae.  Does not report any anxiety or depression.  Remaining review of systems is negative.    Past Medical History:  Diagnosis Date  . Atrial  fibrillation, rapid ([place])    With high ventricular rates. Intol amio, was on Sotalol, now on Lopressor  . Bursitis of right shoulder   . CAD (coronary artery disease) 11/2012   CABG x 4 using left internal mammary artery and right endovein harvest. (LIMA-LAD; VG-OM1, OM2, VG-PD), EF 55-60% at cath  . Degenerative disc disease, lumbar   . Epigastric pain    Myoview 11/09/12 Showed evidence of reversible ischemia in both the inferior wall & the anteroapical distribution.   . Erectile dysfunction    H/O ED while taking a beta-blocker  . GERD (gastroesophageal reflux disease)   . Gout   . Hiatal hernia   . Hyperlipidemia   . Hypertension    Systemic HTN. Pt has H/O of ED when taking a beta-blocker. ECHO 11/26/08 Showed no evidence of any significant valvular disease, Estimated EF = 50-60%.  . Kidney stone    " i have  6-7 stones imbedded in my left kidney "   . Mononeuritis of unspecified site   . Nocturia   . NSVT (nonsustained ventricular tachycardia) ([place])   . Obesity   . 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 PAF (paroxysmal atrial fibrillation) ([facility])    Post CABG. Amia intol. Was on Sotalol, now on Lopressor  . 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 Seasonal allergies   . significant CAD  requiring CABG 12/15/2012  . Skin cancer (melanoma) ([place]) 08/04/2015   left side of face   . Skin lesion of face    hx of  :  Past Surgical History:  Procedure Laterality Date  . CARDIAC CATHETERIZATION     12/08/2012  . COLONOSCOPY    . CORONARY ARTERY BYPASS GRAFT  12/12/2012   CABG x 4 using left internal mammary artery and right endovein harvest. (LIMA-LAD; VG-OM1, OM2, VG-PD)  . EYE SURGERY     had lasik surgery bilaterally 1996  . LEFT HEART CATHETERIZATION WITH CORONARY ANGIOGRAM N/A 12/08/2012   Procedure: LEFT HEART CATHETERIZATION WITH CORONARY  ANGIOGRAM;  Surgeon: Sanda Klein, MD;  Location: [facility];  Service: Cardiovascular;  Laterality: N/A;  . SHOULDER SURGERY     for effects of polio  . SKIN CANCER EXCISION  2016   left side of face   . TRANSURETHRAL RESECTION OF BLADDER TUMOR N/A 11/16/2019   Procedure: TRANSURETHRAL RESECTION OF BLADDER TUMOR (TURBT) WITH INSTILLATION OF POST OPERATIVE CHEMOTHERAPY;  Surgeon: Kathie Rhodes, MD;  Location: [facility];  Service: Urology;  Laterality: N/A;  . TRANSURETHRAL RESECTION OF BLADDER TUMOR WITH MITOMYCIN-C N/A 08/12/2020   Procedure: TRANSURETHRAL RESECTION OF BLADDER TUMOR WITH GEMCITABINE;  Surgeon: Robley Fries, MD;  Location: [facility];  Service: Urology;  Laterality: N/A;  1 HR  . UPPER GI ENDOSCOPY    :   Current Outpatient Medications:  .  allopurinol (ZYLOPRIM) 300 MG tablet, Take 300 mg by mouth daily., Disp: , Rfl:  .  carvedilol (COREG) 6.25 MG tablet, Take 1 tablet (6.25 mg total) by mouth 2 (two) times daily with a meal., Disp: 180 tablet, Rfl: 3 .  naproxen sodium (ALEVE) 220 MG tablet, Take 440 mg by mouth daily as needed (pain)., Disp: , Rfl:  .  Omega-3 Fatty Acids (FISH OIL) 1200 MG CAPS, Take 1,200 mg by mouth daily., Disp: , Rfl:  .  omeprazole (PRILOSEC) 20 MG capsule, Take 20 mg by mouth every Monday, Wednesday, and Friday. , Disp: , Rfl:  .  sildenafil (REVATIO) 20 MG tablet, Take 1-3 tablets (20-60 mg total) by mouth daily as needed., Disp: 30 tablet, Rfl: 3 .  simvastatin (ZOCOR) 40 MG tablet, Take 1 tablet (40 mg total) by mouth daily at 6 PM., Disp: 90 tablet, Rfl: 3 .  tamsulosin (FLOMAX) 0.4 MG CAPS capsule, Take 1 capsule (0.4 mg total) by mouth daily., Disp: 30 capsule, Rfl: 0 .  triamterene-hydrochlorothiazide (MAXZIDE-25) 37.5-25 MG tablet, TAKE 1 TABLET BY MOUTH 4 TIMES A WEEK, Disp: 16 tablet, Rfl: 6 No current facility-administered medications for this visit.  Facility-Administered Medications Ordered in Other Visits:  .  gemcitabine (GEMZAR)  chemo syringe for bladder instillation 2,000 mg, 2,000 mg, Bladder Instillation, Once, Robley Fries, MD:  Allergies  Allergen Reactions  . Amiodarone     fasciculations  . Oxycodone Other (See Comments)    Bad dreams  :  Family History  Problem Relation Age of Onset  . CAD Father   . Hypertension Father   . AAA (abdominal aortic aneurysm) Father   . Aneurysm Father        Cause of death  . Heart attack Paternal Grandfather   . Cancer Mother        Colon. Cause of death  :  Social History   Socioeconomic History  . Marital status: Married    Spouse name: Not on file  . Number of children: 3  . Years  of education: Not on file  . Highest education level: Not on file  Occupational History  . Occupation: Higher education careers adviser (RETIRED)    Employer: [employer]  Tobacco Use  . Smoking status: Never Smoker  . Smokeless tobacco: Never Used  Substance and Sexual Activity  . Alcohol use: Yes    Comment: social, very little amount, tequila  . Drug use: No  . Sexual activity: Not on file  Other Topics Concern  . Not on file  Social History Narrative  . Not on file   Social Determinants of Health   Financial Resource Strain:   . Difficulty of Paying Living Expenses: Not on file  Food Insecurity:   . Worried About Charity fundraiser in the Last Year: Not on file  . Ran Out of Food in the Last Year: Not on file  Transportation Needs:   . Lack of Transportation (Medical): Not on file  . Lack of Transportation (Non-Medical): Not on file  Physical Activity:   . Days of Exercise per Week: Not on file  . Minutes of Exercise per Session: Not on file  Stress:   . Feeling of Stress : Not on file  Social Connections:   . Frequency of Communication with Friends and Family: Not on file  . Frequency of Social Gatherings with Friends and Family: Not on file  . Attends Religious Services: Not on file  . Active Member of Clubs or Organizations: Not on file  . Attends Theatre manager Meetings: Not on file  . Marital Status: Not on file  Intimate Partner Violence:   . Fear of Current or Ex-Partner: Not on file  . Emotionally Abused: Not on file  . Physically Abused: Not on file  . Sexually Abused: Not on file  :  Pertinent items are noted in HPI.  Exam: Blood pressure 114/70, pulse 62, temperature (!) 97 F (36.1 C), temperature source Tympanic, resp. rate 18, height 5\' 9"  (1.753 m), weight 184 lb 8 oz (83.7 kg), SpO2 100 %.  ECOG 0  General appearance: alert and cooperative appeared without distress. Head: atraumatic without any abnormalities. Eyes: conjunctivae/corneas clear. PERRL.  Sclera anicteric. Throat: lips, mucosa, and tongue normal; without oral thrush or ulcers. Resp: clear to auscultation bilaterally without rhonchi, wheezes or dullness to percussion. Cardio: regular rate and rhythm, S1, S2 normal, no murmur, click, rub or gallop GI: soft, non-tender; bowel sounds normal; no masses,  no organomegaly Skin: Skin color, texture, turgor normal. No rashes or lesions Lymph nodes: Cervical, supraclavicular, and axillary nodes normal. Neurologic: Grossly normal without any motor, sensory or deep tendon reflexes. Musculoskeletal: No joint deformity or effusion.    Assessment and Plan:   74 year old man with:  1.  Bladder cancer presented with hematuria in November 2020 and found to have high-grade urothelial [name] without muscle invasion.  He subsequently developed invasive high-grade urothelial [name] with muscle invasion documented on August 12, 2020.   The natural course of this disease was reviewed and treatment options were discussed.  The first step is to complete his staging work-up with a CT scan chest abdomen and pelvis to ensure no evidence of metastatic disease we are likely dealing with localized disease and curative therapy are indicated.  Radical surgery after neoadjuvant chemotherapy would be reasonable for him given his  overall performance status and health condition.  Neoadjuvant chemotherapy with gemcitabine and cisplatin would be the standard of care prior to a radical cystectomy if he elects to go that route.  The logistics and rationale for using chemotherapy was reviewed today in detail.  Complication associated with cisplatin and gemcitabine chemotherapy was discussed.  These complications include nausea, vomiting, myelosuppression, fatigue, infusion related complications, renal insufficiency, neutropenia, neutropenic sepsis and rarely serious thrombosis, hospitalization and death.  The benefit would also if he has an excellent response to chemotherapy, curative surgical resection may be attempted.  The plan is to treat with gemcitabine and cisplatin on day 1, gemcitabine day 8 out of a 21-day cycle.  Anticipate needing 4 cycles of therapy    After discussion today, he is agreeable to proceed after chemo education class.     2.  IV access: Risks and benefits of using Port-A-Cath versus peripheral veins was discussed today.  Complication associated with Port-A-Cath insertion include bleeding, infection and thrombosis.  After discussing the risks and benefits, he is agreeable to proceed.   3.  Antiemetics: Prescription for Compazine was made available to him.   4.  Renal function surveillance: We will continue to monitor on cisplatin therapy.  Baseline creatinine clearance is around 50 cc/min.  He will receive dose reduction of his cisplatin dose at 50 mg per metered square and will monitor periodically.   5.  Goals of care:  Therapy would be curative if his CT scan did not show any evidence of metastatic disease.   6.  Follow-up:  will be in the immediate future to start chemotherapy.  60  minutes were dedicated to this visit. The time was spent on reviewing laboratory data, discussing treatment options, discussing complications related to therapy and answering questions regarding future plan.   A copy  of this consult has been forwarded to the requesting physician.

## 2020-09-03 NOTE — Progress Notes (Signed)

## 2020-09-04 ENCOUNTER — Telehealth: Payer: Self-pay | Admitting: Oncology

## 2020-09-04 NOTE — Telephone Encounter (Signed)
Scheduled per 09/08 los, patient has been called and notified of upcoming appointments.  

## 2020-09-05 ENCOUNTER — Inpatient Hospital Stay: Payer: Medicare Other

## 2020-09-08 ENCOUNTER — Other Ambulatory Visit: Payer: Self-pay | Admitting: *Deleted

## 2020-09-08 ENCOUNTER — Inpatient Hospital Stay: Payer: Medicare Other

## 2020-09-08 ENCOUNTER — Other Ambulatory Visit: Payer: Self-pay

## 2020-09-08 DIAGNOSIS — Z5111 Encounter for antineoplastic chemotherapy: Secondary | ICD-10-CM | POA: Diagnosis not present

## 2020-09-08 DIAGNOSIS — C679 Malignant neoplasm of bladder, unspecified: Secondary | ICD-10-CM

## 2020-09-08 LAB — CMP (CANCER CENTER ONLY)
ALT: 17 U/L (ref 0–44)
AST: 20 U/L (ref 15–41)
Albumin: 3.4 g/dL — ABNORMAL LOW (ref 3.5–5.0)
Alkaline Phosphatase: 95 U/L (ref 38–126)
Anion gap: 8 (ref 5–15)
BUN: 20 mg/dL (ref 8–23)
CO2: 28 mmol/L (ref 22–32)
Calcium: 9.4 mg/dL (ref 8.9–10.3)
Chloride: 101 mmol/L (ref 98–111)
Creatinine: 1.37 mg/dL — ABNORMAL HIGH (ref 0.61–1.24)
GFR, Est AFR Am: 59 mL/min — ABNORMAL LOW (ref >60)
GFR, Estimated: 51 mL/min — ABNORMAL LOW (ref >60)
Glucose, Bld: 98 mg/dL (ref 70–99)
Potassium: 3.9 mmol/L (ref 3.5–5.1)
Sodium: 137 mmol/L (ref 135–145)
Total Bilirubin: 0.5 mg/dL (ref 0.3–1.2)
Total Protein: 7 g/dL (ref 6.5–8.1)

## 2020-09-08 LAB — CBC WITH DIFFERENTIAL (CANCER CENTER ONLY)
Abs Immature Granulocytes: 0.03 10*3/uL (ref 0.00–0.07)
Basophils Absolute: 0.1 10*3/uL (ref 0.0–0.1)
Basophils Relative: 1 %
Eosinophils Absolute: 0.5 10*3/uL (ref 0.0–0.5)
Eosinophils Relative: 6 %
HCT: 42.1 % (ref 39.0–52.0)
Hemoglobin: 14.2 g/dL (ref 13.0–17.0)
Immature Granulocytes: 0 %
Lymphocytes Relative: 20 %
Lymphs Abs: 1.6 10*3/uL (ref 0.7–4.0)
MCH: 30.4 pg (ref 26.0–34.0)
MCHC: 33.7 g/dL (ref 30.0–36.0)
MCV: 90.1 fL (ref 80.0–100.0)
Monocytes Absolute: 0.8 10*3/uL (ref 0.1–1.0)
Monocytes Relative: 9 %
Neutro Abs: 5.2 10*3/uL (ref 1.7–7.7)
Neutrophils Relative %: 64 %
Platelet Count: 256 10*3/uL (ref 150–400)
RBC: 4.67 MIL/uL (ref 4.22–5.81)
RDW: 13.4 % (ref 11.5–15.5)
WBC Count: 8.2 10*3/uL (ref 4.0–10.5)
nRBC: 0 % (ref 0.0–0.2)

## 2020-09-08 NOTE — Progress Notes (Signed)
Lab appt made for pt prior to CT scan in the am. Pt is aware to come to [facility] today for lab draw.

## 2020-09-09 ENCOUNTER — Encounter (HOSPITAL_COMMUNITY): Payer: Self-pay

## 2020-09-09 ENCOUNTER — Telehealth: Payer: Self-pay | Admitting: Oncology

## 2020-09-09 ENCOUNTER — Ambulatory Visit (HOSPITAL_COMMUNITY)
Admission: RE | Admit: 2020-09-09 | Discharge: 2020-09-09 | Disposition: A | Discharge status: Home / Self Care | Payer: Medicare Other | Source: Ambulatory Visit | Attending: Oncology | Admitting: Oncology

## 2020-09-09 ENCOUNTER — Other Ambulatory Visit: Payer: Self-pay | Admitting: Radiology

## 2020-09-09 DIAGNOSIS — C679 Malignant neoplasm of bladder, unspecified: Secondary | ICD-10-CM | POA: Insufficient documentation

## 2020-09-09 HISTORY — DX: Malignant neoplasm of bladder, unspecified: C67.9

## 2020-09-09 MED ORDER — SODIUM CHLORIDE 0.9 % IV SOLN
INTRAVENOUS | Status: AC
  Filled 2020-09-09: qty 250

## 2020-09-09 MED ORDER — IOHEXOL 300 MG/ML  SOLN
100.0000 mL | Freq: Once | INTRAMUSCULAR | Status: AC | PRN
  Administered 2020-09-09: 100 mL via INTRAVENOUS

## 2020-09-09 NOTE — Telephone Encounter (Signed)
Rescheduled appts to 10/20, 11/24 and 12/1 per pt request. Requested wednesdays. [t is aware of appts changed

## 2020-09-10 ENCOUNTER — Ambulatory Visit (HOSPITAL_COMMUNITY)
Admission: RE | Admit: 2020-09-10 | Discharge: 2020-09-10 | Disposition: A | Discharge status: Home / Self Care | Payer: Medicare Other | Source: Ambulatory Visit | Attending: Oncology | Admitting: Oncology

## 2020-09-10 ENCOUNTER — Other Ambulatory Visit: Payer: Self-pay | Admitting: Oncology

## 2020-09-10 ENCOUNTER — Other Ambulatory Visit: Payer: Self-pay

## 2020-09-10 ENCOUNTER — Encounter (HOSPITAL_COMMUNITY): Payer: Self-pay

## 2020-09-10 ENCOUNTER — Ambulatory Visit (HOSPITAL_COMMUNITY): Payer: Medicare Other

## 2020-09-10 DIAGNOSIS — E669 Obesity, unspecified: Secondary | ICD-10-CM | POA: Diagnosis not present

## 2020-09-10 DIAGNOSIS — I472 Ventricular tachycardia: Secondary | ICD-10-CM | POA: Diagnosis not present

## 2020-09-10 DIAGNOSIS — M109 Gout, unspecified: Secondary | ICD-10-CM | POA: Insufficient documentation

## 2020-09-10 DIAGNOSIS — Z79899 Other long term (current) drug therapy: Secondary | ICD-10-CM | POA: Diagnosis not present

## 2020-09-10 DIAGNOSIS — I10 Essential (primary) hypertension: Secondary | ICD-10-CM | POA: Diagnosis not present

## 2020-09-10 DIAGNOSIS — Z951 Presence of aortocoronary bypass graft: Secondary | ICD-10-CM | POA: Diagnosis not present

## 2020-09-10 DIAGNOSIS — E785 Hyperlipidemia, unspecified: Secondary | ICD-10-CM | POA: Insufficient documentation

## 2020-09-10 DIAGNOSIS — Z6827 Body mass index (BMI) 27.0-27.9, adult: Secondary | ICD-10-CM | POA: Diagnosis not present

## 2020-09-10 DIAGNOSIS — I2510 Atherosclerotic heart disease of native coronary artery without angina pectoris: Secondary | ICD-10-CM | POA: Insufficient documentation

## 2020-09-10 DIAGNOSIS — K219 Gastro-esophageal reflux disease without esophagitis: Secondary | ICD-10-CM | POA: Insufficient documentation

## 2020-09-10 DIAGNOSIS — I480 Paroxysmal atrial fibrillation: Secondary | ICD-10-CM | POA: Diagnosis not present

## 2020-09-10 DIAGNOSIS — C679 Malignant neoplasm of bladder, unspecified: Secondary | ICD-10-CM | POA: Diagnosis present

## 2020-09-10 HISTORY — PX: IR IMAGING GUIDED PORT INSERTION: IMG5740

## 2020-09-10 LAB — CBC WITH DIFFERENTIAL/PLATELET
Abs Immature Granulocytes: 0.02 10*3/uL (ref 0.00–0.07)
Basophils Absolute: 0.1 10*3/uL (ref 0.0–0.1)
Basophils Relative: 1 %
Eosinophils Absolute: 0.4 10*3/uL (ref 0.0–0.5)
Eosinophils Relative: 5 %
HCT: 40.4 % (ref 39.0–52.0)
Hemoglobin: 13.9 g/dL (ref 13.0–17.0)
Immature Granulocytes: 0 %
Lymphocytes Relative: 21 %
Lymphs Abs: 1.6 10*3/uL (ref 0.7–4.0)
MCH: 31.3 pg (ref 26.0–34.0)
MCHC: 34.4 g/dL (ref 30.0–36.0)
MCV: 91 fL (ref 80.0–100.0)
Monocytes Absolute: 0.7 10*3/uL (ref 0.1–1.0)
Monocytes Relative: 9 %
Neutro Abs: 5 10*3/uL (ref 1.7–7.7)
Neutrophils Relative %: 64 %
Platelets: 224 10*3/uL (ref 150–400)
RBC: 4.44 MIL/uL (ref 4.22–5.81)
RDW: 13.5 % (ref 11.5–15.5)
WBC: 7.8 10*3/uL (ref 4.0–10.5)
nRBC: 0 % (ref 0.0–0.2)

## 2020-09-10 LAB — PROTIME-INR
INR: 1 (ref 0.8–1.2)
Prothrombin Time: 12.5 seconds (ref 11.4–15.2)

## 2020-09-10 MED ORDER — MIDAZOLAM HCL 2 MG/2ML IJ SOLN
INTRAMUSCULAR | Status: AC | PRN
  Administered 2020-09-10: 0.5 mg via INTRAVENOUS
  Administered 2020-09-10 (×2): 1 mg via INTRAVENOUS
  Administered 2020-09-10 (×3): 0.5 mg via INTRAVENOUS

## 2020-09-10 MED ORDER — LIDOCAINE-EPINEPHRINE 1 %-1:100000 IJ SOLN
INTRAMUSCULAR | Status: AC
  Filled 2020-09-10: qty 1

## 2020-09-10 MED ORDER — FENTANYL CITRATE (PF) 100 MCG/2ML IJ SOLN
INTRAMUSCULAR | Status: DC
Start: 2020-09-10 — End: 2020-09-11
  Filled 2020-09-10: qty 2

## 2020-09-10 MED ORDER — HEPARIN SOD (PORK) LOCK FLUSH 100 UNIT/ML IV SOLN
INTRAVENOUS | Status: AC | PRN
  Administered 2020-09-10: 500 [IU] via INTRAVENOUS

## 2020-09-10 MED ORDER — SODIUM CHLORIDE 0.9 % IV SOLN: INTRAVENOUS | Status: DC

## 2020-09-10 MED ORDER — CEFAZOLIN SODIUM-DEXTROSE 2-4 GM/100ML-% IV SOLN
INTRAVENOUS | Status: AC
  Administered 2020-09-10: 2 g via INTRAVENOUS
  Filled 2020-09-10: qty 100

## 2020-09-10 MED ORDER — FENTANYL CITRATE (PF) 100 MCG/2ML IJ SOLN
INTRAMUSCULAR | Status: AC | PRN
  Administered 2020-09-10 (×2): 50 ug via INTRAVENOUS

## 2020-09-10 MED ORDER — LIDOCAINE-EPINEPHRINE 1 %-1:100000 IJ SOLN
INTRAMUSCULAR | Status: AC | PRN
  Administered 2020-09-10: 10 mL via INTRADERMAL

## 2020-09-10 MED ORDER — CEFAZOLIN SODIUM-DEXTROSE 2-4 GM/100ML-% IV SOLN: 2.0000 g | INTRAVENOUS | Status: AC

## 2020-09-10 MED ORDER — HEPARIN SOD (PORK) LOCK FLUSH 100 UNIT/ML IV SOLN
INTRAVENOUS | Status: AC
  Filled 2020-09-10: qty 5

## 2020-09-10 MED ORDER — MIDAZOLAM HCL 2 MG/2ML IJ SOLN
INTRAMUSCULAR | Status: AC
  Filled 2020-09-10: qty 4

## 2020-09-10 NOTE — H&P (Signed)
Chief Complaint: Patient was seen in consultation today for port-a-catheter insertion  Referring Physician(s): Wyatt Portela  Supervising Physician: Jacqulynn Cadet  Patient Status: [facility] - Out-pt  History of Present Illness: Hunter [name] is a 74 y.o. male with a medical history significant for polio, paroxysmal atrial fibrillation, CAD with CABG x4, and bladder cancer, diagnosed 10/2019. 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 He completed six weeks of intravesical immunotherapy. He developed recurrent hematuria and surveillance cystoscopy 08/12/2020 revealed several new tumors which were resected. His oncology team is preparing him for neoadjuvant chemotherapy.   Interventional Radiology has been asked to evaluate this patient for an image-guide port-a-catheter placement to facilitate his treatment plans.        Past Medical History:  Diagnosis Date   Atrial fibrillation, rapid ([place])    With high ventricular rates. Intol amio, was on Sotalol, now on Lopressor   Bladder cancer ([place]) dx'd 10/2019   Bursitis of right shoulder    CAD (coronary artery disease) 11/2012   CABG x 4 using left internal mammary artery and right endovein harvest. (LIMA-LAD; VG-OM1, OM2, VG-PD), EF 55-60% at cath   Degenerative disc disease, lumbar    Epigastric pain    Myoview 11/09/12 Showed evidence of reversible ischemia in both the inferior wall & the anteroapical distribution.    Erectile dysfunction    H/O ED while taking a beta-blocker   GERD (gastroesophageal reflux disease)    Gout    Hiatal hernia    Hyperlipidemia    Hypertension    Systemic HTN. Pt has H/O of ED when taking a beta-blocker. ECHO 11/26/08 Showed no evidence of any significant valvular disease, Estimated EF = 50-60%.   Kidney stone    " i have  6-7 stones imbedded in my left kidney "    Mononeuritis of unspecified  site    Nocturia    NSVT (nonsustained ventricular tachycardia) ([facility])    Obesity    Osteoarthritis 08/2019   right knee; cortisone shot    PAF (paroxysmal atrial fibrillation) ([place])    Post CABG. Amia intol. Was on Sotalol, now on Lopressor   Polio    Age 64. 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; reports dx at 99mos     S/P CABG x 4, 12/12/12, LIMA-LAD;VG-OM1,OM2;VG-PD 12/15/2012   Seasonal allergies    significant CAD requiring CABG 12/15/2012   Skin cancer (melanoma) ([place]) 08/04/2015   left side of face    Skin lesion of face    hx of    Past Surgical History:  Procedure Laterality Date   CARDIAC CATHETERIZATION     12/08/2012   COLONOSCOPY     CORONARY ARTERY BYPASS GRAFT  12/12/2012   CABG x 4 using left internal mammary artery and right endovein harvest. (LIMA-LAD; VG-OM1, OM2, VG-PD)   EYE SURGERY     had lasik surgery bilaterally 1996   LEFT HEART CATHETERIZATION WITH CORONARY ANGIOGRAM N/A 12/08/2012   Procedure: LEFT HEART CATHETERIZATION WITH CORONARY ANGIOGRAM;  Surgeon: Sanda Klein, MD;  Location: [facility];  Service: Cardiovascular;  Laterality: N/A;   SHOULDER SURGERY     for effects of polio   SKIN CANCER EXCISION  2016   left side of face    TRANSURETHRAL RESECTION OF BLADDER TUMOR N/A 11/16/2019   Procedure: TRANSURETHRAL RESECTION OF BLADDER TUMOR (TURBT) WITH INSTILLATION OF POST OPERATIVE CHEMOTHERAPY;  Surgeon: Kathie Rhodes, MD;  Location: [facility];  Service: Urology;  Laterality: N/A;   TRANSURETHRAL RESECTION OF BLADDER TUMOR WITH MITOMYCIN-C N/A 08/12/2020   Procedure: TRANSURETHRAL RESECTION OF BLADDER TUMOR WITH GEMCITABINE;  Surgeon: Robley Fries, MD;  Location: [facility];  Service: Urology;  Laterality: N/A;  1 HR   UPPER GI ENDOSCOPY      Allergies: Amiodarone and Oxycodone  Medications: Prior to Admission medications   Medication Sig Start Date End Date Taking? Authorizing Provider  allopurinol (ZYLOPRIM)  300 MG tablet Take 300 mg by mouth daily.    [provider]  carvedilol (COREG) 6.25 MG tablet Take 1 tablet (6.25 mg total) by mouth 2 (two) times daily with a meal. 08/07/20   Barrett, Evelene Croon, PA-C  lidocaine-prilocaine (EMLA) cream Apply 1 application topically as needed. 09/03/20   Wyatt Portela, MD  naproxen sodium (ALEVE) 220 MG tablet Take 440 mg by mouth daily as needed (pain).    [provider]  Omega-3 Fatty Acids (FISH OIL) 1200 MG CAPS Take 1,200 mg by mouth daily.    [provider]  omeprazole (PRILOSEC) 20 MG capsule Take 20 mg by mouth every Monday, Wednesday, and Friday.     [provider]  prochlorperazine (COMPAZINE) 10 MG tablet Take 1 tablet (10 mg total) by mouth every 6 (six) hours as needed for nausea or vomiting. 09/03/20   Wyatt Portela, MD  sildenafil (REVATIO) 20 MG tablet Take 1-3 tablets (20-60 mg total) by mouth daily as needed. 08/07/20   Barrett, Evelene Croon, PA-C  simvastatin (ZOCOR) 40 MG tablet Take 1 tablet (40 mg total) by mouth daily at 6 PM. 08/07/20   Barrett, Evelene Croon, PA-C  tamsulosin (FLOMAX) 0.4 MG CAPS capsule Take 1 capsule (0.4 mg total) by mouth daily. 08/12/20   Robley Fries, MD  triamterene-hydrochlorothiazide (MAXZIDE-25) 37.5-25 MG tablet TAKE 1 TABLET BY MOUTH 4 TIMES A WEEK 08/07/20   Barrett, Evelene Croon, PA-C     Family History  Problem Relation Age of Onset   CAD Father    Hypertension Father    AAA (abdominal aortic aneurysm) Father    Aneurysm Father        Cause of death   Heart attack Paternal Grandfather    Cancer Mother        Colon. Cause of death    Social History   Socioeconomic History   Marital status: Married    Spouse name: Not on file   Number of children: 3   Years of education: Not on file   Highest education level: Not on file  Occupational History   Occupation: Higher education careers adviser (RETIRED)    Employer: [employer]  Tobacco Use   Smoking status: Never Smoker    Smokeless tobacco: Never Used  Substance and Sexual Activity   Alcohol use: Yes    Comment: social, very little amount, tequila   Drug use: No   Sexual activity: Not on file  Other Topics Concern   Not on file  Social History Narrative   Not on file   Social Determinants of Health   Financial Resource Strain:    Difficulty of Paying Living Expenses: Not on file  Food Insecurity:    Worried About Charity fundraiser in the Last Year: Not on file   [employer] of Food in the Last Year: Not on file  Transportation Needs:    Lack of Transportation (Medical): Not on file   Lack of Transportation (Non-Medical): Not on file  Physical  Activity:    Days of Exercise per Week: Not on file   Minutes of Exercise per Session: Not on file  Stress:    Feeling of Stress : Not on file  Social Connections:    Frequency of Communication with Friends and Family: Not on file   Frequency of Social Gatherings with Friends and Family: Not on file   Attends Religious Services: Not on file   Active Member of Clubs or Organizations: Not on file   Attends Archivist Meetings: Not on file   Marital Status: Not on file    Review of Systems: A 12 point ROS discussed and pertinent positives are indicated in the HPI above.  All other systems are negative.  Review of Systems  Constitutional: Positive for fatigue. Negative for appetite change.       Mild  Respiratory: Negative for cough and shortness of breath.   Cardiovascular: Negative for chest pain and leg swelling.  Gastrointestinal: Negative for abdominal pain, diarrhea, nausea and vomiting.  Genitourinary: Positive for hematuria.       Mild  Neurological: Negative for headaches.    Vital Signs: BP 131/79 (BP Location: Left Arm)    Pulse 67    Temp 98.2 F (36.8 C) (Oral)    Resp 16    Ht 5\' 9"  (1.753 m)    Wt 185 lb (83.9 kg)    SpO2 100%    BMI 27.32 kg/m   Physical Exam Constitutional:      General: He is not  in acute distress. HENT:     Mouth/Throat:     Mouth: Mucous membranes are moist.     Pharynx: Oropharynx is clear.  Cardiovascular:     Rate and Rhythm: Normal rate and regular rhythm.     Pulses: Normal pulses.     Heart sounds: Normal heart sounds.  Pulmonary:     Effort: Pulmonary effort is normal.     Breath sounds: Normal breath sounds.  Abdominal:     General: Bowel sounds are normal.     Palpations: Abdomen is soft.  Musculoskeletal:     Comments: Left arm/hand muscle atrophy; hx of Polio  Skin:    General: Skin is warm and dry.  Neurological:     Mental Status: He is alert and oriented to person, place, and time.     Imaging: CT CHEST ABDOMEN PELVIS W CONTRAST  Result Date: 09/09/2020 CLINICAL DATA:  Bladder cancer, chemotherapy to begin next week, hematuria, planned cystoprostatectomy EXAM: CT CHEST, ABDOMEN, AND PELVIS WITH CONTRAST TECHNIQUE: Multidetector CT imaging of the chest, abdomen and pelvis was performed following the standard protocol during bolus administration of intravenous contrast. CONTRAST:  149mL OMNIPAQUE IOHEXOL 300 MG/ML SOLN, additional oral enteric contrast COMPARISON:  CT abdomen pelvis, 10/29/2019 FINDINGS: CT CHEST FINDINGS Cardiovascular: Aortic atherosclerosis. Normal heart size. Three-vessel coronary artery calcifications. No pericardial effusion. Mediastinum/Nodes: No enlarged mediastinal, hilar, or axillary lymph nodes. Small hiatal hernia. Thyroid gland, trachea, and esophagus demonstrate no significant findings. Lungs/Pleura: Mild, diffuse bilateral bronchial wall thickening. Innumerable tiny centrilobular pulmonary nodules, most numerous in the lung apices. No pleural effusion or pneumothorax. Musculoskeletal: No chest wall mass or suspicious bone lesions identified. CT ABDOMEN PELVIS FINDINGS Hepatobiliary: No solid liver abnormality is seen. No gallstones, gallbladder wall thickening, or biliary dilatation. Pancreas: Unremarkable. No  pancreatic ductal dilatation or surrounding inflammatory changes. Spleen: Normal in size without significant abnormality. Adrenals/Urinary Tract: Adrenal glands are unremarkable. Large exophytic cyst of the superior pole of the  right kidney. Multiple small nonobstructive left renal calculi. No ureteral calculus or hydronephrosis. There is extensive thickening of the superior urinary bladder wall (series 11, image 72), with multiple small diverticula and extensive associated fat stranding. There is a prominent diverticulum of the bladder dome. Stomach/Bowel: Stomach is within normal limits. Appendix appears normal. No evidence of bowel wall thickening, distention, or inflammatory changes. Vascular/Lymphatic: Severe, irregular aortic atherosclerosis with multiple chronic appearing penetrating ulcerations (series 10, image 60). Overall appearance and configuration is not significantly changed compared to prior examination. There is a prominent although subcentimeter left iliac lymph node measuring 5 mm, increased in size compared to prior examination at which time it measured 3 mm (series 5, image 72). No other changed or abnormally enlarged lymph nodes. Reproductive: Mild prostatomegaly. Other: Small, fat containing bilateral inguinal hernias. No abdominopelvic ascites. Musculoskeletal: No acute or significant osseous findings. IMPRESSION: 1. There is extensive thickening of the superior urinary bladder wall, with multiple small diverticula and extensive associated fat stranding. Findings are consistent with known bladder malignancy. 2. There is a prominent although subcentimeter left iliac lymph node measuring 5 mm, increased in size compared to prior examination at which time it measured 3 mm, suspicious, although not definite for nodal metastatic disease. Attention on follow-up. No other changed or abnormally enlarged lymph nodes in the chest, abdomen, or pelvis. 3. No evidence of distant metastatic disease in the  chest, abdomen, or pelvis. 4. Prostatomegaly. 5. Nonobstructive left nephrolithiasis. 6. Mild, diffuse bilateral bronchial wall thickening with innumerable tiny centrilobular pulmonary nodules, most numerous in the lung apices. Findings are most consistent with smoking-related respiratory bronchiolitis. 7. Severe, irregular aortic atherosclerosis with multiple chronic appearing penetrating ulcerations. Overall appearance and configuration is not significantly changed compared to prior examination. Aortic Atherosclerosis (ICD10-I70.0). 8. Coronary artery disease. Electronically Signed   By: Eddie Candle M.D.   On: 09/09/2020 11:32    Labs:  CBC: Recent Labs    11/14/19 1203 08/08/20 0844 09/08/20 1233 09/10/20 1320  WBC 7.8 6.7 8.2 7.8  HGB 15.4 14.8 14.2 13.9  HCT 45.9 44.4 42.1 40.4  PLT 239 172 256 224    COAGS: Recent Labs    09/10/20 1320  INR 1.0    BMP: Recent Labs    11/14/19 1203 08/08/20 0844 09/08/20 1233  NA 137 140 137  K 3.6 3.7 3.9  CL 99 103 101  CO2 27 29 28   GLUCOSE 102* 106* 98  BUN 22 16 20   CALCIUM 9.3 9.0 9.4  CREATININE 1.47* 1.35* 1.37*  GFRNONAA 47* 52* 51*  GFRAA 54* 60* 59*    LIVER FUNCTION TESTS: Recent Labs    09/08/20 1233  BILITOT 0.5  AST 20  ALT 17  ALKPHOS 95  PROT 7.0  ALBUMIN 3.4*    TUMOR MARKERS: No results for input(s): AFPTM, CEA, CA199, CHROMGRNA in the last 8760 hours.  Assessment and Plan:  Bladder cancer; plans for neoadjuvant chemotherapy: Hunter [name], 74 year old male, presents today to the [facility] department for an image-guided port-a-catheter placement.  Risks and benefits of image-guided Port-a-catheter placement were discussed with the patient including, but not limited to bleeding, infection, pneumothorax, or fibrin sheath development and need for additional procedures.  All of the patient's questions were answered, patient is agreeable to proceed.  The patient has  been NPO. Vitals have been reviewed. He does not take any blood-thinning medications.   Consent signed and in chart.  Thank you for this interesting consult.  I greatly  enjoyed meeting Hunter [name] and look forward to participating in their care.  A copy of this report was sent to the requesting provider on this date.  Electronically Signed: Soyla Dryer, AGACNP-BC [phone] 09/10/2020, 2:11 PM   I spent a total of  30 Minutes   in face to face in clinical consultation, greater than 50% of which was counseling/coordinating care for port-a-catheter placement.

## 2020-09-10 NOTE — Discharge Instructions (Signed)
For questions /concerns may call Interventional Radiology at [phone]  You may remove your dressing and shower tomorrow afternoon  DO NOT use EMLA cream for 2 weeks after port placement as the cream will remove surgical glue on your incision.    Implanted Port Insertion, Care After This sheet gives you information about how to care for yourself after your procedure. Your health care provider may also give you more specific instructions. If you have problems or questions, contact your health care provider. What can I expect after the procedure? After the procedure, it is common to have:  Discomfort at the port insertion site.  Bruising on the skin over the port. This should improve over 3-4 days. Follow these instructions at home: Port care  After your port is placed, you will get a manufacturer's information card. The card has information about your port. Keep this card with you at all times.  Take care of the port as told by your health care provider. Ask your health care provider if you or a family member can get training for taking care of the port at home. A home health care nurse may also take care of the port.  Make sure to remember what type of port you have. Incision care      Follow instructions from your health care provider about how to take care of your port insertion site. Make sure you: ? Wash your hands with soap and water before and after you change your bandage (dressing). If soap and water are not available, use hand sanitizer. ? Change your dressing as told by your health care provider. ? Leave stitches (sutures), skin glue, or adhesive strips in place. These skin closures may need to stay in place for 2 weeks or longer. If adhesive strip edges start to loosen and curl up, you may trim the loose edges. Do not remove adhesive strips completely unless your health care provider tells you to do that.  Check your port insertion site every day for signs of  infection. Check for: ? Redness, swelling, or pain. ? Fluid or blood. ? Warmth. ? Pus or a bad smell. Activity  Return to your normal activities as told by your health care provider. Ask your health care provider what activities are safe for you.  Do not lift anything that is heavier than 10 lb (4.5 kg), or the limit that you are told, until your health care provider says that it is safe. General instructions  Take over-the-counter and prescription medicines only as told by your health care provider.  Do not take baths, swim, or use a hot tub until your health care provider approves. Ask your health care provider if you may take showers. You may only be allowed to take sponge baths.  Do not drive for 24 hours if you were given a sedative during your procedure.  Wear a medical alert bracelet in case of an emergency. This will tell any health care providers that you have a port.  Keep all follow-up visits as told by your health care provider. This is important. Contact a health care provider if:  You cannot flush your port with saline as directed, or you cannot draw blood from the port.  You have a fever or chills.  You have redness, swelling, or pain around your port insertion site.  You have fluid or blood coming from your port insertion site.  Your port insertion site feels warm to the touch.  You have pus or a bad   smell coming from the port insertion site. Get help right away if:  You have chest pain or shortness of breath.  You have bleeding from your port that you cannot control. Summary  Take care of the port as told by your health care provider. Keep the manufacturer's information card with you at all times.  Change your dressing as told by your health care provider.  Contact a health care provider if you have a fever or chills or if you have redness, swelling, or pain around your port insertion site.  Keep all follow-up visits as told by your health care  provider. This information is not intended to replace advice given to you by your health care provider. Make sure you discuss any questions you have with your health care provider. Document Revised: 07/11/2018 Document Reviewed: 07/11/2018 Elsevier Patient Education  2020 Elsevier Inc. Moderate Conscious Sedation, Adult, Care After These instructions provide you with information about caring for yourself after your procedure. Your health care provider may also give you more specific instructions. Your treatment has been planned according to current medical practices, but problems sometimes occur. Call your health care provider if you have any problems or questions after your procedure. What can I expect after the procedure? After your procedure, it is common:  To feel sleepy for several hours.  To feel clumsy and have poor balance for several hours.  To have poor judgment for several hours.  To vomit if you eat too soon. Follow these instructions at home: For at least 24 hours after the procedure:   Do not: ? Participate in activities where you could fall or become injured. ? Drive. ? Use heavy machinery. ? Drink alcohol. ? Take sleeping pills or medicines that cause drowsiness. ? Make important decisions or sign legal documents. ? Take care of children on your own.  Rest. Eating and drinking  Follow the diet recommended by your health care provider.  If you vomit: ? Drink water, juice, or soup when you can drink without vomiting. ? Make sure you have little or no nausea before eating solid foods. General instructions  Have a responsible adult stay with you until you are awake and alert.  Take over-the-counter and prescription medicines only as told by your health care provider.  If you smoke, do not smoke without supervision.  Keep all follow-up visits as told by your health care provider. This is important. Contact a health care provider if:  You keep feeling  nauseous or you keep vomiting.  You feel light-headed.  You develop a rash.  You have a fever. Get help right away if:  You have trouble breathing. This information is not intended to replace advice given to you by your health care provider. Make sure you discuss any questions you have with your health care provider. Document Revised: 11/25/2017 Document Reviewed: 04/03/2016 Elsevier Patient Education  2020 Elsevier Inc.  

## 2020-09-10 NOTE — Procedures (Signed)
Interventional Radiology Procedure Note  Procedure: Placement of a right IJ approach single lumen PowerPort.  Tip is positioned at the superior cavoatrial junction and catheter is ready for immediate use.  Complications: No immediate Recommendations:  - Ok to shower tomorrow - Do not submerge for 7 days - Routine line care   Signed,  Basilia Stuckert K. Lanaysia Fritchman, MD   

## 2020-09-11 NOTE — Progress Notes (Signed)
Pharmacist Chemotherapy Monitoring - Initial Assessment    Anticipated start date: 09/17/20    Regimen:  . Are orders appropriate based on the patient's diagnosis, regimen, and cycle? Yes . Does the plan date match the patient's scheduled date? Yes . Is the sequencing of drugs appropriate? Yes . Are the premedications appropriate for the patient's regimen? Yes . Prior Authorization for treatment is: Approved o If applicable, is the correct biosimilar selected based on the patient's insurance? not applicable  Organ Function and Labs: Marland Kitchen Are dose adjustments needed based on the patient's renal function, hepatic function, or hematologic function? Yes . Are appropriate labs ordered prior to the start of patient's treatment? Yes . Other organ system assessment, if indicated: N/A . The following baseline labs, if indicated, have been ordered: cisplatin: K, Mg  Dose Assessment: . Are the drug doses appropriate? Yes . Are the following correct: o Drug concentrations Yes o IV fluid compatible with drug Yes o Administration routes Yes o Timing of therapy Yes . If applicable, does the patient have documented access for treatment and/or plans for port-a-cath placement? yes . If applicable, have lifetime cumulative doses been properly documented and assessed? yes Lifetime Dose Tracking  No doses have been documented on this patient for the following tracked chemicals: Doxorubicin, Epirubicin, Idarubicin, Daunorubicin, Mitoxantrone, Bleomycin, Oxaliplatin, Carboplatin, Liposomal Doxorubicin  o   Toxicity Monitoring/Prevention: . The patient has the following take home antiemetics prescribed: Prochlorperazine . The patient has the following take home medications prescribed: N/A . Medication allergies and previous infusion related reactions, if applicable, have been reviewed and addressed. Yes . The patient's current medication list has been assessed for drug-drug interactions with their  chemotherapy regimen. no significant drug-drug interactions were identified on review.  Order Review: . Are the treatment plan orders signed? Yes . Is the patient scheduled to see a provider prior to their treatment? No  I verify that I have reviewed each item in the above checklist and answered each question accordingly.  Romualdo Bolk [facility] 09/11/2020 12:43 PM

## 2020-09-17 ENCOUNTER — Inpatient Hospital Stay: Payer: Medicare Other

## 2020-09-17 ENCOUNTER — Other Ambulatory Visit: Payer: Self-pay

## 2020-09-17 VITALS — BP 128/76 | HR 60 | Temp 98.2°F | Resp 16

## 2020-09-17 DIAGNOSIS — C679 Malignant neoplasm of bladder, unspecified: Secondary | ICD-10-CM

## 2020-09-17 DIAGNOSIS — Z5111 Encounter for antineoplastic chemotherapy: Secondary | ICD-10-CM | POA: Diagnosis not present

## 2020-09-17 LAB — CMP (CANCER CENTER ONLY)
ALT: 13 U/L (ref 0–44)
AST: 17 U/L (ref 15–41)
Albumin: 3.2 g/dL — ABNORMAL LOW (ref 3.5–5.0)
Alkaline Phosphatase: 82 U/L (ref 38–126)
Anion gap: 3 — ABNORMAL LOW (ref 5–15)
BUN: 24 mg/dL — ABNORMAL HIGH (ref 8–23)
CO2: 30 mmol/L (ref 22–32)
Calcium: 9.1 mg/dL (ref 8.9–10.3)
Chloride: 103 mmol/L (ref 98–111)
Creatinine: 1.25 mg/dL — ABNORMAL HIGH (ref 0.61–1.24)
GFR, Est AFR Am: >60 mL/min (ref >60)
GFR, Estimated: 57 mL/min — ABNORMAL LOW (ref >60)
Glucose, Bld: 91 mg/dL (ref 70–99)
Potassium: 3.6 mmol/L (ref 3.5–5.1)
Sodium: 136 mmol/L (ref 135–145)
Total Bilirubin: 0.6 mg/dL (ref 0.3–1.2)
Total Protein: 6.5 g/dL (ref 6.5–8.1)

## 2020-09-17 LAB — CBC WITH DIFFERENTIAL (CANCER CENTER ONLY)
Abs Immature Granulocytes: 0.04 10*3/uL (ref 0.00–0.07)
Basophils Absolute: 0.1 10*3/uL (ref 0.0–0.1)
Basophils Relative: 1 %
Eosinophils Absolute: 0.3 10*3/uL (ref 0.0–0.5)
Eosinophils Relative: 3 %
HCT: 39.1 % (ref 39.0–52.0)
Hemoglobin: 13.1 g/dL (ref 13.0–17.0)
Immature Granulocytes: 1 %
Lymphocytes Relative: 17 %
Lymphs Abs: 1.4 10*3/uL (ref 0.7–4.0)
MCH: 30.9 pg (ref 26.0–34.0)
MCHC: 33.5 g/dL (ref 30.0–36.0)
MCV: 92.2 fL (ref 80.0–100.0)
Monocytes Absolute: 0.6 10*3/uL (ref 0.1–1.0)
Monocytes Relative: 8 %
Neutro Abs: 5.7 10*3/uL (ref 1.7–7.7)
Neutrophils Relative %: 70 %
Platelet Count: 191 10*3/uL (ref 150–400)
RBC: 4.24 MIL/uL (ref 4.22–5.81)
RDW: 14.3 % (ref 11.5–15.5)
WBC Count: 8 10*3/uL (ref 4.0–10.5)
nRBC: 0 % (ref 0.0–0.2)

## 2020-09-17 MED ORDER — PALONOSETRON HCL INJECTION 0.25 MG/5ML
INTRAVENOUS | Status: AC
  Filled 2020-09-17: qty 5

## 2020-09-17 MED ORDER — SODIUM CHLORIDE 0.9 % IV SOLN
10.0000 mg | Freq: Once | INTRAVENOUS | Status: AC
  Administered 2020-09-17: 10 mg via INTRAVENOUS
  Filled 2020-09-17: qty 10

## 2020-09-17 MED ORDER — SODIUM CHLORIDE 0.9% FLUSH
3.0000 mL | INTRAVENOUS | Status: DC | PRN
  Filled 2020-09-17: qty 10

## 2020-09-17 MED ORDER — SODIUM CHLORIDE 0.9% FLUSH
10.0000 mL | INTRAVENOUS | Status: DC | PRN
  Administered 2020-09-17: 10 mL
  Filled 2020-09-17: qty 10

## 2020-09-17 MED ORDER — SODIUM CHLORIDE 0.9 % IV SOLN
Freq: Once | INTRAVENOUS | Status: AC
  Filled 2020-09-17: qty 10

## 2020-09-17 MED ORDER — HEPARIN SOD (PORK) LOCK FLUSH 100 UNIT/ML IV SOLN
500.0000 [IU] | Freq: Once | INTRAVENOUS | Status: AC | PRN
  Administered 2020-09-17: 500 [IU]
  Filled 2020-09-17: qty 5

## 2020-09-17 MED ORDER — SODIUM CHLORIDE 0.9 % IV SOLN
49.5000 mg/m2 | Freq: Once | INTRAVENOUS | Status: AC
  Administered 2020-09-17: 100 mg via INTRAVENOUS
  Filled 2020-09-17: qty 100

## 2020-09-17 MED ORDER — SODIUM CHLORIDE 0.9 % IV SOLN
Freq: Once | INTRAVENOUS | Status: AC
  Filled 2020-09-17: qty 250

## 2020-09-17 MED ORDER — SODIUM CHLORIDE 0.9 % IV SOLN
2000.0000 mg | Freq: Once | INTRAVENOUS | Status: AC
  Administered 2020-09-17: 2000 mg via INTRAVENOUS
  Filled 2020-09-17: qty 52.6

## 2020-09-17 MED ORDER — PALONOSETRON HCL INJECTION 0.25 MG/5ML
0.2500 mg | Freq: Once | INTRAVENOUS | Status: AC
  Administered 2020-09-17: 0.25 mg via INTRAVENOUS

## 2020-09-17 MED ORDER — SODIUM CHLORIDE 0.9 % IV SOLN
150.0000 mg | Freq: Once | INTRAVENOUS | Status: AC
  Administered 2020-09-17: 150 mg via INTRAVENOUS
  Filled 2020-09-17: qty 150

## 2020-09-17 NOTE — Patient Instructions (Signed)

## 2020-09-17 NOTE — Patient Instructions (Signed)
[facility] Discharge Instructions for Patients Receiving Chemotherapy  Today you received the following chemotherapy agents gemzar, cisplatin   To help prevent nausea and vomiting after your treatment, we encourage you to take your nausea medication as directed by your MD. Do not take zofran for 3 days.    If you develop nausea and vomiting that is not controlled by your nausea medication, call the clinic.   BELOW ARE SYMPTOMS THAT SHOULD BE REPORTED IMMEDIATELY:  *FEVER GREATER THAN 100.5 F  *CHILLS WITH OR WITHOUT FEVER  NAUSEA AND VOMITING THAT IS NOT CONTROLLED WITH YOUR NAUSEA MEDICATION  *UNUSUAL SHORTNESS OF BREATH  *UNUSUAL BRUISING OR BLEEDING  TENDERNESS IN MOUTH AND THROAT WITH OR WITHOUT PRESENCE OF ULCERS  *URINARY PROBLEMS  *BOWEL PROBLEMS  UNUSUAL RASH Items with * indicate a potential emergency and should be followed up as soon as possible.  Feel free to call the clinic should you have any questions or concerns. The clinic phone number is [phone].  Please show the [place] at check-in to the Emergency Department and triage nurse.

## 2020-09-18 ENCOUNTER — Telehealth: Payer: Self-pay | Admitting: Oncology

## 2020-09-18 NOTE — Telephone Encounter (Signed)
Released OV note from 09-03-20 to [facility] urology @ [phone]

## 2020-09-22 ENCOUNTER — Telehealth: Payer: Self-pay | Admitting: *Deleted

## 2020-09-22 NOTE — Telephone Encounter (Signed)
Received call earlier this am from pt stating that he has been feeling light headed all day.  He states he took compazine thinking it might help.  Informed that that may may dizziness worse & make him sleepy & it is for nausea.  He denies n/v, diarrhea & states he is drinking & eating OK.  Discussed increasing fluids in case he is not getting enough in to see if that helps.  Encouraged to check his BP which he says he will when his wife gets home.  Pt is seeing Dr Alen Blew tomorrow & encouraged to discuss then.  He was in agreement. Message routed to Dr Corliss Parish RN.

## 2020-09-23 ENCOUNTER — Inpatient Hospital Stay: Payer: Medicare Other

## 2020-09-23 ENCOUNTER — Inpatient Hospital Stay (HOSPITAL_BASED_OUTPATIENT_CLINIC_OR_DEPARTMENT_OTHER): Payer: Medicare Other | Admitting: Oncology

## 2020-09-23 ENCOUNTER — Other Ambulatory Visit: Payer: Self-pay

## 2020-09-23 VITALS — BP 120/83 | HR 64 | Temp 98.3°F | Resp 18 | Wt 184.2 lb

## 2020-09-23 DIAGNOSIS — Z95828 Presence of other vascular implants and grafts: Secondary | ICD-10-CM

## 2020-09-23 DIAGNOSIS — C679 Malignant neoplasm of bladder, unspecified: Secondary | ICD-10-CM

## 2020-09-23 DIAGNOSIS — I2510 Atherosclerotic heart disease of native coronary artery without angina pectoris: Secondary | ICD-10-CM

## 2020-09-23 DIAGNOSIS — Z5111 Encounter for antineoplastic chemotherapy: Secondary | ICD-10-CM | POA: Diagnosis not present

## 2020-09-23 LAB — CMP (CANCER CENTER ONLY)
ALT: 23 U/L (ref 0–44)
AST: 19 U/L (ref 15–41)
Albumin: 3.4 g/dL — ABNORMAL LOW (ref 3.5–5.0)
Alkaline Phosphatase: 91 U/L (ref 38–126)
Anion gap: 9 (ref 5–15)
BUN: 29 mg/dL — ABNORMAL HIGH (ref 8–23)
CO2: 28 mmol/L (ref 22–32)
Calcium: 9.2 mg/dL (ref 8.9–10.3)
Chloride: 97 mmol/L — ABNORMAL LOW (ref 98–111)
Creatinine: 1.3 mg/dL — ABNORMAL HIGH (ref 0.61–1.24)
GFR, Est AFR Am: >60 mL/min (ref >60)
GFR, Estimated: 54 mL/min — ABNORMAL LOW (ref >60)
Glucose, Bld: 88 mg/dL (ref 70–99)
Potassium: 3.7 mmol/L (ref 3.5–5.1)
Sodium: 134 mmol/L — ABNORMAL LOW (ref 135–145)
Total Bilirubin: 0.6 mg/dL (ref 0.3–1.2)
Total Protein: 6.7 g/dL (ref 6.5–8.1)

## 2020-09-23 LAB — CBC WITH DIFFERENTIAL (CANCER CENTER ONLY)
Abs Immature Granulocytes: 0.01 10*3/uL (ref 0.00–0.07)
Basophils Absolute: 0 10*3/uL (ref 0.0–0.1)
Basophils Relative: 1 %
Eosinophils Absolute: 0.1 10*3/uL (ref 0.0–0.5)
Eosinophils Relative: 2 %
HCT: 39.3 % (ref 39.0–52.0)
Hemoglobin: 13.5 g/dL (ref 13.0–17.0)
Immature Granulocytes: 0 %
Lymphocytes Relative: 23 %
Lymphs Abs: 1.2 10*3/uL (ref 0.7–4.0)
MCH: 31 pg (ref 26.0–34.0)
MCHC: 34.4 g/dL (ref 30.0–36.0)
MCV: 90.1 fL (ref 80.0–100.0)
Monocytes Absolute: 0.1 10*3/uL (ref 0.1–1.0)
Monocytes Relative: 2 %
Neutro Abs: 3.7 10*3/uL (ref 1.7–7.7)
Neutrophils Relative %: 72 %
Platelet Count: 140 10*3/uL — ABNORMAL LOW (ref 150–400)
RBC: 4.36 MIL/uL (ref 4.22–5.81)
RDW: 13.4 % (ref 11.5–15.5)
WBC Count: 5.2 10*3/uL (ref 4.0–10.5)
nRBC: 0 % (ref 0.0–0.2)

## 2020-09-23 MED ORDER — PROCHLORPERAZINE MALEATE 10 MG PO TABS
ORAL_TABLET | ORAL | Status: AC
  Filled 2020-09-23: qty 1

## 2020-09-23 MED ORDER — HEPARIN SOD (PORK) LOCK FLUSH 100 UNIT/ML IV SOLN
500.0000 [IU] | Freq: Once | INTRAVENOUS | Status: AC | PRN
  Administered 2020-09-23: 500 [IU]
  Filled 2020-09-23: qty 5

## 2020-09-23 MED ORDER — SODIUM CHLORIDE 0.9% FLUSH
10.0000 mL | INTRAVENOUS | Status: DC | PRN
  Administered 2020-09-23: 10 mL
  Filled 2020-09-23: qty 10

## 2020-09-23 MED ORDER — SODIUM CHLORIDE 0.9 % IV SOLN
Freq: Once | INTRAVENOUS | Status: AC
  Filled 2020-09-23: qty 250

## 2020-09-23 MED ORDER — PROCHLORPERAZINE MALEATE 10 MG PO TABS
10.0000 mg | ORAL_TABLET | Freq: Once | ORAL | Status: AC
  Administered 2020-09-23: 10 mg via ORAL

## 2020-09-23 MED ORDER — SODIUM CHLORIDE 0.9% FLUSH
10.0000 mL | Freq: Once | INTRAVENOUS | Status: AC
  Administered 2020-09-23: 10 mL
  Filled 2020-09-23: qty 10

## 2020-09-23 MED ORDER — DIPHENHYDRAMINE HCL 25 MG PO CAPS
ORAL_CAPSULE | ORAL | Status: AC
  Filled 2020-09-23: qty 2

## 2020-09-23 MED ORDER — ACETAMINOPHEN 325 MG PO TABS
ORAL_TABLET | ORAL | Status: AC
  Filled 2020-09-23: qty 2

## 2020-09-23 MED ORDER — SODIUM CHLORIDE 0.9 % IV SOLN
2000.0000 mg | Freq: Once | INTRAVENOUS | Status: AC
  Administered 2020-09-23: 2000 mg via INTRAVENOUS
  Filled 2020-09-23: qty 52.6

## 2020-09-23 NOTE — Patient Instructions (Signed)
[facility] Discharge Instructions for Patients Receiving Chemotherapy  Today you received the following chemotherapy agent: Gemcitabine (Gemzar)  To help prevent nausea and vomiting after your treatment, we encourage you to take your nausea medication as directed by your MD.   If you develop nausea and vomiting that is not controlled by your nausea medication, call the clinic.   BELOW ARE SYMPTOMS THAT SHOULD BE REPORTED IMMEDIATELY:  *FEVER GREATER THAN 100.5 F  *CHILLS WITH OR WITHOUT FEVER  NAUSEA AND VOMITING THAT IS NOT CONTROLLED WITH YOUR NAUSEA MEDICATION  *UNUSUAL SHORTNESS OF BREATH  *UNUSUAL BRUISING OR BLEEDING  TENDERNESS IN MOUTH AND THROAT WITH OR WITHOUT PRESENCE OF ULCERS  *URINARY PROBLEMS  *BOWEL PROBLEMS  UNUSUAL RASH Items with * indicate a potential emergency and should be followed up as soon as possible.  Feel free to call the clinic should you have any questions or concerns. The clinic phone number is [phone].  Please show the CHEMO ALERT CARD at check-in to the Emergency Department and triage nurse.   

## 2020-09-23 NOTE — Progress Notes (Signed)
Hematology and Oncology Follow Up Visit  Hunter [name] [id number] 04/09/1946 74 y.o. 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  He was found to have localized tumor with T2N0 disease without any clear-cut metastatic involvement.   Prior Therapy:  He is status post TURBT completed in August 2021 which confirmed the presence of muscle invasion.  Current therapy:  Neoadjuvant chemotherapy utilizing cisplatin and gemcitabine started on September 17, 2020.  He is here for day 8 of cycle 1.  Interim History: [name] returns today for a follow-up visit.  Since the last visit, he received day 1 of cycle 1 of chemotherapy without any complications.  He denies any nausea, vomiting or abdominal pain.  He denies any infusion related complications.  He does report some mild lightheadedness but no dizziness or syncope.  Performance status quality of life remain excellent.     Medications: I have reviewed the patient's current medications.  Current Outpatient Medications  Medication Sig Dispense Refill  . allopurinol (ZYLOPRIM) 300 MG tablet Take 300 mg by mouth daily.    . carvedilol (COREG) 6.25 MG tablet Take 1 tablet (6.25 mg total) by mouth 2 (two) times daily with a meal. 180 tablet 3  . lidocaine-prilocaine (EMLA) cream Apply 1 application topically as needed. 30 g 0  . naproxen sodium (ALEVE) 220 MG tablet Take 440 mg by mouth daily as needed (pain).    . Omega-3 Fatty Acids (FISH OIL) 1200 MG CAPS Take 1,200 mg by mouth daily.    Marland Kitchen omeprazole (PRILOSEC) 20 MG capsule Take 20 mg by mouth every Monday, Wednesday, and Friday.     . prochlorperazine (COMPAZINE) 10 MG tablet Take 1 tablet (10 mg total) by mouth every 6 (six) hours as needed for nausea or vomiting. 30 tablet 0  . sildenafil (REVATIO) 20 MG tablet Take 1-3 tablets (20-60 mg total) by mouth  daily as needed. 30 tablet 3  . simvastatin (ZOCOR) 40 MG tablet Take 1 tablet (40 mg total) by mouth daily at 6 PM. 90 tablet 3  . tamsulosin (FLOMAX) 0.4 MG CAPS capsule Take 1 capsule (0.4 mg total) by mouth daily. 30 capsule 0  . triamterene-hydrochlorothiazide (MAXZIDE-25) 37.5-25 MG tablet TAKE 1 TABLET BY MOUTH 4 TIMES A WEEK 16 tablet 6   No current facility-administered medications for this visit.   Facility-Administered Medications Ordered in Other Visits  Medication Dose Route Frequency Provider Last Rate Last Admin  . gemcitabine (GEMZAR) chemo syringe for bladder instillation 2,000 mg  2,000 mg Bladder Instillation Once Robley Fries, MD         Allergies:  Allergies  Allergen Reactions  . Amiodarone     fasciculations  . Oxycodone Other (See Comments)    Bad dreams      Physical Exam: Blood pressure 120/83, pulse 64, temperature 98.3 F (36.8 C), temperature source Tympanic, resp. rate 18, weight 184 lb 3.2 oz (83.6 kg), SpO2 100 %.  ECOG: 0   General appearance: Comfortable appearing without any discomfort Head: Normocephalic without any trauma Oropharynx: Mucous membranes are moist and pink without any thrush or ulcers. Eyes: Pupils are equal and round reactive to light. Lymph nodes: No cervical, supraclavicular, inguinal or axillary lymphadenopathy.   Heart:regular rate and rhythm.  S1 and S2 without leg edema. Lung: Clear without any rhonchi or wheezes.  No dullness to percussion. Abdomin: Soft, nontender, nondistended with good bowel  sounds.  No hepatosplenomegaly. Musculoskeletal: No joint deformity or effusion.  Full range of motion noted. Neurological: No deficits noted on motor, sensory and deep tendon reflex exam. Skin: No petechial rash or dryness.  Appeared moist.     Lab Results: Lab Results  Component Value Date   WBC 8.0 09/17/2020   HGB 13.1 09/17/2020   HCT 39.1 09/17/2020   MCV 92.2 09/17/2020   PLT 191 09/17/2020     Chemistry       Component Value Date/Time   NA 136 09/17/2020 0815   NA 142 08/24/2019 1415   K 3.6 09/17/2020 0815   CL 103 09/17/2020 0815   CO2 30 09/17/2020 0815   BUN 24 (H) 09/17/2020 0815   BUN 36 (H) 08/24/2019 1415   CREATININE 1.25 (H) 09/17/2020 0815   CREATININE 1.25 09/26/2014 0921      Component Value Date/Time   CALCIUM 9.1 09/17/2020 0815   ALKPHOS 82 09/17/2020 0815   AST 17 09/17/2020 0815   ALT 13 09/17/2020 0815   BILITOT 0.6 09/17/2020 0815        Impression and Plan:  74 year old man with:  1.    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  Staging work-up including CT scan chest abdomen and pelvis obtained on 09/09/2020 showed no evidence of metastatic disease.   He is currently receiving neoadjuvant chemotherapy utilizing gemcitabine plan without any major complications.  Risks and benefits of continuing this approach were reviewed.  Complications include nausea, vomiting, myelosuppression and infusion related complications were discussed.  He is agreeable to proceed at this time.   2. IV access: Port-A-Cath inserted without any issues.   3. Antiemetics: No nausea or vomiting reported.  Compazine is available to him..  4. Renal function surveillance:He is kidney function remains close to normal range with creatinine clearance of around 57 cc/min.  We will continue to monitor on platinum therapy.  5. Goals of care:  Therapy remains curative at this time and aggressive measures are warranted.   6. Follow-up: He will return in 2 weeks for start of cycle 2 of therapy.  30  minutes were spent on this encounter.  The time was dedicated to reviewing his disease status, imaging studies as well as addressing complication related to his cancer and cancer therapy.   Zola Button, MD 9/28/202111:36 AM

## 2020-09-23 NOTE — Patient Instructions (Signed)

## 2020-10-07 ENCOUNTER — Inpatient Hospital Stay: Payer: Medicare Other

## 2020-10-07 ENCOUNTER — Inpatient Hospital Stay: Payer: Medicare Other | Attending: Oncology

## 2020-10-07 ENCOUNTER — Inpatient Hospital Stay (HOSPITAL_BASED_OUTPATIENT_CLINIC_OR_DEPARTMENT_OTHER): Payer: Medicare Other | Admitting: Oncology

## 2020-10-07 ENCOUNTER — Other Ambulatory Visit: Payer: Self-pay

## 2020-10-07 VITALS — BP 125/68 | HR 68 | Temp 97.8°F | Resp 18 | Ht 69.0 in | Wt 186.7 lb

## 2020-10-07 DIAGNOSIS — C679 Malignant neoplasm of bladder, unspecified: Secondary | ICD-10-CM

## 2020-10-07 DIAGNOSIS — T451X5A Adverse effect of antineoplastic and immunosuppressive drugs, initial encounter: Secondary | ICD-10-CM | POA: Diagnosis not present

## 2020-10-07 DIAGNOSIS — Z5111 Encounter for antineoplastic chemotherapy: Secondary | ICD-10-CM | POA: Insufficient documentation

## 2020-10-07 DIAGNOSIS — I2510 Atherosclerotic heart disease of native coronary artery without angina pectoris: Secondary | ICD-10-CM | POA: Diagnosis not present

## 2020-10-07 DIAGNOSIS — D701 Agranulocytosis secondary to cancer chemotherapy: Secondary | ICD-10-CM | POA: Insufficient documentation

## 2020-10-07 LAB — CMP (CANCER CENTER ONLY)
ALT: 14 U/L (ref 0–44)
AST: 16 U/L (ref 15–41)
Albumin: 3.2 g/dL — ABNORMAL LOW (ref 3.5–5.0)
Alkaline Phosphatase: 93 U/L (ref 38–126)
Anion gap: 5 (ref 5–15)
BUN: 21 mg/dL (ref 8–23)
CO2: 30 mmol/L (ref 22–32)
Calcium: 9.3 mg/dL (ref 8.9–10.3)
Chloride: 102 mmol/L (ref 98–111)
Creatinine: 1.21 mg/dL (ref 0.61–1.24)
GFR, Estimated: 59 mL/min — ABNORMAL LOW (ref >60)
Glucose, Bld: 100 mg/dL — ABNORMAL HIGH (ref 70–99)
Potassium: 3 mmol/L — CL (ref 3.5–5.1)
Sodium: 137 mmol/L (ref 135–145)
Total Bilirubin: 0.3 mg/dL (ref 0.3–1.2)
Total Protein: 6.6 g/dL (ref 6.5–8.1)

## 2020-10-07 LAB — CBC WITH DIFFERENTIAL (CANCER CENTER ONLY)
Abs Immature Granulocytes: 0.03 10*3/uL (ref 0.00–0.07)
Basophils Absolute: 0 10*3/uL (ref 0.0–0.1)
Basophils Relative: 1 %
Eosinophils Absolute: 0.1 10*3/uL (ref 0.0–0.5)
Eosinophils Relative: 5 %
HCT: 34.7 % — ABNORMAL LOW (ref 39.0–52.0)
Hemoglobin: 11.9 g/dL — ABNORMAL LOW (ref 13.0–17.0)
Immature Granulocytes: 1 %
Lymphocytes Relative: 33 %
Lymphs Abs: 1 10*3/uL (ref 0.7–4.0)
MCH: 30.5 pg (ref 26.0–34.0)
MCHC: 34.3 g/dL (ref 30.0–36.0)
MCV: 89 fL (ref 80.0–100.0)
Monocytes Absolute: 0.6 10*3/uL (ref 0.1–1.0)
Monocytes Relative: 21 %
Neutro Abs: 1.2 10*3/uL — ABNORMAL LOW (ref 1.7–7.7)
Neutrophils Relative %: 39 %
Platelet Count: 368 10*3/uL (ref 150–400)
RBC: 3.9 MIL/uL — ABNORMAL LOW (ref 4.22–5.81)
RDW: 14.3 % (ref 11.5–15.5)
WBC Count: 3.1 10*3/uL — ABNORMAL LOW (ref 4.0–10.5)
nRBC: 0 % (ref 0.0–0.2)

## 2020-10-07 MED ORDER — SODIUM CHLORIDE 0.9 % IV SOLN
800.0000 mg/m2 | Freq: Once | INTRAVENOUS | Status: AC
  Administered 2020-10-07: 1634 mg via INTRAVENOUS
  Filled 2020-10-07: qty 42.98

## 2020-10-07 MED ORDER — SODIUM CHLORIDE 0.9 % IV SOLN
49.5000 mg/m2 | Freq: Once | INTRAVENOUS | Status: AC
  Administered 2020-10-07: 100 mg via INTRAVENOUS
  Filled 2020-10-07: qty 100

## 2020-10-07 MED ORDER — HEPARIN SOD (PORK) LOCK FLUSH 100 UNIT/ML IV SOLN
500.0000 [IU] | Freq: Once | INTRAVENOUS | Status: AC | PRN
  Administered 2020-10-07: 500 [IU]
  Filled 2020-10-07: qty 5

## 2020-10-07 MED ORDER — SODIUM CHLORIDE 0.9% FLUSH
10.0000 mL | INTRAVENOUS | Status: DC | PRN
  Administered 2020-10-07: 10 mL
  Filled 2020-10-07: qty 10

## 2020-10-07 MED ORDER — SODIUM CHLORIDE 0.9 % IV SOLN
Freq: Once | INTRAVENOUS | Status: AC
  Filled 2020-10-07: qty 250

## 2020-10-07 MED ORDER — SODIUM CHLORIDE 0.9 % IV SOLN
Freq: Once | INTRAVENOUS | Status: AC
  Filled 2020-10-07: qty 20

## 2020-10-07 MED ORDER — PALONOSETRON HCL INJECTION 0.25 MG/5ML
0.2500 mg | Freq: Once | INTRAVENOUS | Status: AC
  Administered 2020-10-07: 0.25 mg via INTRAVENOUS

## 2020-10-07 MED ORDER — SODIUM CHLORIDE 0.9 % IV SOLN
10.0000 mg | Freq: Once | INTRAVENOUS | Status: AC
  Administered 2020-10-07: 10 mg via INTRAVENOUS
  Filled 2020-10-07: qty 10

## 2020-10-07 MED ORDER — PALONOSETRON HCL INJECTION 0.25 MG/5ML
INTRAVENOUS | Status: AC
  Filled 2020-10-07: qty 5

## 2020-10-07 MED ORDER — SODIUM CHLORIDE 0.9 % IV SOLN
150.0000 mg | Freq: Once | INTRAVENOUS | Status: AC
  Administered 2020-10-07: 150 mg via INTRAVENOUS
  Filled 2020-10-07: qty 150

## 2020-10-07 MED ORDER — SODIUM CHLORIDE 0.9 % IV SOLN: 50.0000 mg/m2 | Freq: Once | INTRAVENOUS | Status: DC

## 2020-10-07 NOTE — Progress Notes (Signed)
Hematology and Oncology Follow Up Visit  Hunter [name] [id number] 1946-03-12 74 y.o. 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     Prior Therapy:  He is status post TURBT completed in August 2021 which confirmed the presence of muscle invasion.  Current therapy:  Neoadjuvant chemotherapy utilizing cisplatin and gemcitabine started on September 17, 2020.  He is here for day 1 of cycle 2 of therapy.  Interim History: [name] presents today for return evaluation.  Since last visit, he reports no major changes in his health.  He tolerated the first cycle of chemotherapy without any residual nausea, vomiting or excessive fatigue.  Denies any recent hospitalization or illnesses.  He denies any flank pain or hematuria.  He denies any changes in his bowel habits.     Medications: No changes noted on review. Current Outpatient Medications  Medication Sig Dispense Refill  . allopurinol (ZYLOPRIM) 300 MG tablet Take 300 mg by mouth daily.    . carvedilol (COREG) 6.25 MG tablet Take 1 tablet (6.25 mg total) by mouth 2 (two) times daily with a meal. 180 tablet 3  . lidocaine-prilocaine (EMLA) cream Apply 1 application topically as needed. 30 g 0  . naproxen sodium (ALEVE) 220 MG tablet Take 440 mg by mouth daily as needed (pain).    . Omega-3 Fatty Acids (FISH OIL) 1200 MG CAPS Take 1,200 mg by mouth daily.    Marland Kitchen omeprazole (PRILOSEC) 20 MG capsule Take 20 mg by mouth every Monday, Wednesday, and Friday.     . prochlorperazine (COMPAZINE) 10 MG tablet Take 1 tablet (10 mg total) by mouth every 6 (six) hours as needed for nausea or vomiting. 30 tablet 0  . sildenafil (REVATIO) 20 MG tablet Take 1-3 tablets (20-60 mg total) by mouth daily as needed. 30 tablet 3  . simvastatin (ZOCOR) 40 MG tablet Take 1 tablet (40 mg total) by mouth daily at 6 PM. 90 tablet  3  . tamsulosin (FLOMAX) 0.4 MG CAPS capsule Take 1 capsule (0.4 mg total) by mouth daily. 30 capsule 0  . triamterene-hydrochlorothiazide (MAXZIDE-25) 37.5-25 MG tablet TAKE 1 TABLET BY MOUTH 4 TIMES A WEEK 16 tablet 6   No current facility-administered medications for this visit.   Facility-Administered Medications Ordered in Other Visits  Medication Dose Route Frequency Provider Last Rate Last Admin  . gemcitabine (GEMZAR) chemo syringe for bladder instillation 2,000 mg  2,000 mg Bladder Instillation Once Robley Fries, MD         Allergies:  Allergies  Allergen Reactions  . Amiodarone     fasciculations  . Oxycodone Other (See Comments)    Bad dreams      Physical Exam: Blood pressure 125/68, pulse 68, temperature 97.8 F (36.6 C), temperature source Tympanic, resp. rate 18, height 5\' 9"  (1.753 m), weight 186 lb 11.2 oz (84.7 kg), SpO2 96 %.   ECOG: 0    General appearance: Alert, awake without any distress. Head: Atraumatic without abnormalities Oropharynx: Without any thrush or ulcers. Eyes: No scleral icterus. Lymph nodes: No lymphadenopathy noted in the cervical, supraclavicular, or axillary nodes Heart:regular rate and rhythm, without any murmurs or gallops.   Lung: Clear to auscultation without any rhonchi, wheezes or dullness to percussion. Abdomin: Soft, nontender without any shifting dullness or ascites. Musculoskeletal: No clubbing or cyanosis. Neurological: No motor or sensory deficits. Skin: No rashes or lesions.  Lab Results: Lab Results  Component Value Date   WBC 5.2 09/23/2020   HGB 13.5 09/23/2020   HCT 39.3 09/23/2020   MCV 90.1 09/23/2020   PLT 140 (L) 09/23/2020     Chemistry      Component Value Date/Time   NA 134 (L) 09/23/2020 1149   NA 142 08/24/2019 1415   K 3.7 09/23/2020 1149   CL 97 (L) 09/23/2020 1149   CO2 28 09/23/2020 1149   BUN 29 (H) 09/23/2020 1149   BUN 36 (H) 08/24/2019 1415   CREATININE 1.30 (H)  09/23/2020 1149   CREATININE 1.25 09/26/2014 0921      Component Value Date/Time   CALCIUM 9.2 09/23/2020 1149   ALKPHOS 91 09/23/2020 1149   AST 19 09/23/2020 1149   ALT 23 09/23/2020 1149   BILITOT 0.6 09/23/2020 1149        Impression and Plan:  74 year old man with:  1.    Bladder cancer diagnosed in August 2021.  He was found to have T2N0 high-grade urothelial carcinoma.    He has completed the first cycle of chemotherapy without any major complications.  Risks and benefits of proceeding with cycle 2 were discussed.  Potential complications including myelosuppression, peripheral neuropathy and infusion related effects.  Renal dysfunction as well as liver toxicity were discussed.  He is agreeable to proceed at this time.   2. IV access: Port-A-Cath remains in place without any issues.   3. Antiemetics: Compazine is available to him without any nausea or vomiting.  4. Renal function surveillance:Creatinine clearance remained stable without any need for any additional adjustments.  5.  Neutropenia: Related to chemotherapy: Continue to monitor and make dose adjustments as needed.  6. Goals of care: his disease is curable and aggressive measures are warranted.   7. Follow-up: In 1 week for day 8 of cycle 2 and in 3 weeks for the start of cycle 3.  30  minutes were dedicated to this visit.  The time was spent on reviewing his disease status, discussing treatment options and answering questions regarding future plan of care.   Zola Button, MD 10/12/20219:11 AM

## 2020-10-07 NOTE — Progress Notes (Signed)
CRITICAL VALUE STICKER  CRITICAL VALUE: Potassium 3.0  RECEIVER (on-site recipient of call): Meriel Flavors LPN [place] NOTIFIED:  10/07/20 8421 MESSENGER (representative from lab): Delsa Sale MD NOTIFIED:  Dr Alen Blew TIME OF NOTIFICATION: [phone] RESPONSE:  Getting vitamin K during treatment. Called in prescription for 20 dur of  K, PO for 14 days.

## 2020-10-07 NOTE — Patient Instructions (Signed)

## 2020-10-07 NOTE — Progress Notes (Signed)
Increase K in hydration fluids to 80meq per Dr Alen Blew

## 2020-10-07 NOTE — Patient Instructions (Signed)
[place] Discharge Instructions for Patients Receiving Chemotherapy  Today you received the following chemotherapy agents: Gemzar, and Cisplatin.   To help prevent nausea and vomiting after your treatment, we encourage you to take your nausea medication as directed by your MD. Do not take zofran for 3 days.    If you develop nausea and vomiting that is not controlled by your nausea medication, call the clinic.   BELOW ARE SYMPTOMS THAT SHOULD BE REPORTED IMMEDIATELY:  *FEVER GREATER THAN 100.5 F  *CHILLS WITH OR WITHOUT FEVER  NAUSEA AND VOMITING THAT IS NOT CONTROLLED WITH YOUR NAUSEA MEDICATION  *UNUSUAL SHORTNESS OF BREATH  *UNUSUAL BRUISING OR BLEEDING  TENDERNESS IN MOUTH AND THROAT WITH OR WITHOUT PRESENCE OF ULCERS  *URINARY PROBLEMS  *BOWEL PROBLEMS  UNUSUAL RASH Items with * indicate a potential emergency and should be followed up as soon as possible.  Feel free to call the clinic should you have any questions or concerns. The clinic phone number is [phone].  Please show the [place] at check-in to the Emergency Department and triage nurse.

## 2020-10-08 ENCOUNTER — Telehealth: Payer: Self-pay

## 2020-10-14 ENCOUNTER — Other Ambulatory Visit: Payer: Medicare Other

## 2020-10-14 ENCOUNTER — Ambulatory Visit: Payer: Medicare Other

## 2020-10-14 ENCOUNTER — Other Ambulatory Visit: Payer: Self-pay | Admitting: Oncology

## 2020-10-14 MED ORDER — MIRABEGRON ER 25 MG PO TB24: 25.0000 mg | ORAL_TABLET | Freq: Every day | ORAL | 1 refills | Status: DC

## 2020-10-15 ENCOUNTER — Other Ambulatory Visit: Payer: Self-pay

## 2020-10-15 ENCOUNTER — Inpatient Hospital Stay: Payer: Medicare Other

## 2020-10-15 VITALS — BP 108/76 | HR 83 | Temp 98.4°F | Resp 18

## 2020-10-15 DIAGNOSIS — Z95828 Presence of other vascular implants and grafts: Secondary | ICD-10-CM

## 2020-10-15 DIAGNOSIS — C679 Malignant neoplasm of bladder, unspecified: Secondary | ICD-10-CM

## 2020-10-15 DIAGNOSIS — Z5111 Encounter for antineoplastic chemotherapy: Secondary | ICD-10-CM | POA: Diagnosis not present

## 2020-10-15 DIAGNOSIS — R82998 Other abnormal findings in urine: Secondary | ICD-10-CM

## 2020-10-15 LAB — URINALYSIS, COMPLETE (UACMP) WITH MICROSCOPIC
Bilirubin Urine: NEGATIVE
Glucose, UA: NEGATIVE mg/dL
Ketones, ur: NEGATIVE mg/dL
Nitrite: POSITIVE — AB
Protein, ur: 100 mg/dL — AB
Specific Gravity, Urine: 1.01 (ref 1.005–1.030)
WBC, UA: >50 WBC/hpf — ABNORMAL HIGH (ref 0–5)
pH: 6 (ref 5.0–8.0)

## 2020-10-15 LAB — CBC WITH DIFFERENTIAL (CANCER CENTER ONLY)
Abs Immature Granulocytes: 0.31 10*3/uL — ABNORMAL HIGH (ref 0.00–0.07)
Basophils Absolute: 0.1 10*3/uL (ref 0.0–0.1)
Basophils Relative: 1 %
Eosinophils Absolute: 0 10*3/uL (ref 0.0–0.5)
Eosinophils Relative: 0 %
HCT: 36.6 % — ABNORMAL LOW (ref 39.0–52.0)
Hemoglobin: 12.9 g/dL — ABNORMAL LOW (ref 13.0–17.0)
Immature Granulocytes: 4 %
Lymphocytes Relative: 12 %
Lymphs Abs: 1 10*3/uL (ref 0.7–4.0)
MCH: 31.2 pg (ref 26.0–34.0)
MCHC: 35.2 g/dL (ref 30.0–36.0)
MCV: 88.4 fL (ref 80.0–100.0)
Monocytes Absolute: 1.3 10*3/uL — ABNORMAL HIGH (ref 0.1–1.0)
Monocytes Relative: 15 %
Neutro Abs: 5.9 10*3/uL (ref 1.7–7.7)
Neutrophils Relative %: 68 %
Platelet Count: 299 10*3/uL (ref 150–400)
RBC: 4.14 MIL/uL — ABNORMAL LOW (ref 4.22–5.81)
RDW: 13.9 % (ref 11.5–15.5)
WBC Count: 8.6 10*3/uL (ref 4.0–10.5)
nRBC: 0.2 % (ref 0.0–0.2)

## 2020-10-15 LAB — CMP (CANCER CENTER ONLY)
ALT: 11 U/L (ref 0–44)
AST: 14 U/L — ABNORMAL LOW (ref 15–41)
Albumin: 3.3 g/dL — ABNORMAL LOW (ref 3.5–5.0)
Alkaline Phosphatase: 95 U/L (ref 38–126)
Anion gap: 7 (ref 5–15)
BUN: 27 mg/dL — ABNORMAL HIGH (ref 8–23)
CO2: 29 mmol/L (ref 22–32)
Calcium: 9.8 mg/dL (ref 8.9–10.3)
Chloride: 95 mmol/L — ABNORMAL LOW (ref 98–111)
Creatinine: 1.65 mg/dL — ABNORMAL HIGH (ref 0.61–1.24)
GFR, Estimated: 40 mL/min — ABNORMAL LOW (ref >60)
Glucose, Bld: 115 mg/dL — ABNORMAL HIGH (ref 70–99)
Potassium: 3.7 mmol/L (ref 3.5–5.1)
Sodium: 131 mmol/L — ABNORMAL LOW (ref 135–145)
Total Bilirubin: 0.4 mg/dL (ref 0.3–1.2)
Total Protein: 7.1 g/dL (ref 6.5–8.1)

## 2020-10-15 LAB — MAGNESIUM: Magnesium: 1.8 mg/dL (ref 1.7–2.4)

## 2020-10-15 MED ORDER — HEPARIN SOD (PORK) LOCK FLUSH 100 UNIT/ML IV SOLN
500.0000 [IU] | Freq: Once | INTRAVENOUS | Status: AC | PRN
  Administered 2020-10-15: 500 [IU]
  Filled 2020-10-15: qty 5

## 2020-10-15 MED ORDER — SODIUM CHLORIDE 0.9 % IV SOLN
800.0000 mg/m2 | Freq: Once | INTRAVENOUS | Status: AC
  Administered 2020-10-15: 1634 mg via INTRAVENOUS
  Filled 2020-10-15: qty 42.98

## 2020-10-15 MED ORDER — SODIUM CHLORIDE 0.9 % IV SOLN
Freq: Once | INTRAVENOUS | Status: AC
  Filled 2020-10-15: qty 250

## 2020-10-15 MED ORDER — PROCHLORPERAZINE MALEATE 10 MG PO TABS
ORAL_TABLET | ORAL | Status: AC
  Filled 2020-10-15: qty 1

## 2020-10-15 MED ORDER — SODIUM CHLORIDE 0.9% FLUSH
10.0000 mL | Freq: Once | INTRAVENOUS | Status: AC
  Administered 2020-10-15: 10 mL
  Filled 2020-10-15: qty 10

## 2020-10-15 MED ORDER — SODIUM CHLORIDE 0.9% FLUSH
10.0000 mL | INTRAVENOUS | Status: DC | PRN
  Administered 2020-10-15: 10 mL
  Filled 2020-10-15: qty 10

## 2020-10-15 MED ORDER — PROCHLORPERAZINE MALEATE 10 MG PO TABS
10.0000 mg | ORAL_TABLET | Freq: Once | ORAL | Status: AC
  Administered 2020-10-15: 10 mg via ORAL

## 2020-10-15 NOTE — Progress Notes (Signed)
Pt's creatnine today is 1.65.  Ok to treat per Dr. Alen Blew.

## 2020-10-15 NOTE — Patient Instructions (Signed)
[facility] °Discharge Instructions for Patients Receiving Chemotherapy ° °Today you received the following chemotherapy agents Gemzar ° °To help prevent nausea and vomiting after your treatment, we encourage you to take your nausea medication as directed. °  °If you develop nausea and vomiting that is not controlled by your nausea medication, call the clinic.  ° °BELOW ARE SYMPTOMS THAT SHOULD BE REPORTED IMMEDIATELY: °· *FEVER GREATER THAN 100.5 F °· *CHILLS WITH OR WITHOUT FEVER °· NAUSEA AND VOMITING THAT IS NOT CONTROLLED WITH YOUR NAUSEA MEDICATION °· *UNUSUAL SHORTNESS OF BREATH °· *UNUSUAL BRUISING OR BLEEDING °· TENDERNESS IN MOUTH AND THROAT WITH OR WITHOUT PRESENCE OF ULCERS °· *URINARY PROBLEMS °· *BOWEL PROBLEMS °· UNUSUAL RASH °Items with * indicate a potential emergency and should be followed up as soon as possible. ° °Feel free to call the clinic should you have any questions or concerns. The clinic phone number is [phone]. ° °Please show the CHEMO ALERT CARD at check-in to the Emergency Department and triage nurse. ° ° °

## 2020-10-15 NOTE — Patient Instructions (Signed)

## 2020-10-16 ENCOUNTER — Other Ambulatory Visit: Payer: Self-pay

## 2020-10-16 MED ORDER — CIPROFLOXACIN HCL 500 MG PO TABS: 500.0000 mg | ORAL_TABLET | Freq: Two times a day (BID) | ORAL | 0 refills | Status: AC

## 2020-10-16 NOTE — Progress Notes (Signed)
errorr

## 2020-10-16 NOTE — Progress Notes (Signed)
Sent prescription for Cipro per Dr Alen Blew over to [facility]. Called patient and made him aware that his prescription had been sent in.

## 2020-10-17 LAB — URINE CULTURE: Culture: >=100000 — AB

## 2020-10-29 ENCOUNTER — Other Ambulatory Visit: Payer: Self-pay

## 2020-10-29 ENCOUNTER — Inpatient Hospital Stay: Payer: Medicare Other

## 2020-10-29 ENCOUNTER — Inpatient Hospital Stay (HOSPITAL_BASED_OUTPATIENT_CLINIC_OR_DEPARTMENT_OTHER): Payer: Medicare Other | Admitting: Oncology

## 2020-10-29 ENCOUNTER — Inpatient Hospital Stay: Payer: Medicare Other | Attending: Oncology

## 2020-10-29 VITALS — BP 121/77 | HR 80 | Temp 97.3°F | Resp 18 | Ht 69.0 in | Wt 183.4 lb

## 2020-10-29 DIAGNOSIS — T451X5A Adverse effect of antineoplastic and immunosuppressive drugs, initial encounter: Secondary | ICD-10-CM | POA: Diagnosis not present

## 2020-10-29 DIAGNOSIS — R110 Nausea: Secondary | ICD-10-CM | POA: Insufficient documentation

## 2020-10-29 DIAGNOSIS — D6481 Anemia due to antineoplastic chemotherapy: Secondary | ICD-10-CM | POA: Insufficient documentation

## 2020-10-29 DIAGNOSIS — D630 Anemia in neoplastic disease: Secondary | ICD-10-CM | POA: Diagnosis not present

## 2020-10-29 DIAGNOSIS — N179 Acute kidney failure, unspecified: Secondary | ICD-10-CM | POA: Diagnosis not present

## 2020-10-29 DIAGNOSIS — I2510 Atherosclerotic heart disease of native coronary artery without angina pectoris: Secondary | ICD-10-CM | POA: Diagnosis not present

## 2020-10-29 DIAGNOSIS — C679 Malignant neoplasm of bladder, unspecified: Secondary | ICD-10-CM

## 2020-10-29 DIAGNOSIS — D709 Neutropenia, unspecified: Secondary | ICD-10-CM | POA: Diagnosis not present

## 2020-10-29 DIAGNOSIS — N390 Urinary tract infection, site not specified: Secondary | ICD-10-CM | POA: Diagnosis not present

## 2020-10-29 DIAGNOSIS — Z5111 Encounter for antineoplastic chemotherapy: Secondary | ICD-10-CM | POA: Insufficient documentation

## 2020-10-29 DIAGNOSIS — Z79899 Other long term (current) drug therapy: Secondary | ICD-10-CM | POA: Diagnosis not present

## 2020-10-29 DIAGNOSIS — I959 Hypotension, unspecified: Secondary | ICD-10-CM | POA: Diagnosis not present

## 2020-10-29 LAB — CBC WITH DIFFERENTIAL (CANCER CENTER ONLY)
Abs Immature Granulocytes: 0.05 10*3/uL (ref 0.00–0.07)
Basophils Absolute: 0.1 10*3/uL (ref 0.0–0.1)
Basophils Relative: 1 %
Eosinophils Absolute: 0.2 10*3/uL (ref 0.0–0.5)
Eosinophils Relative: 2 %
HCT: 32 % — ABNORMAL LOW (ref 39.0–52.0)
Hemoglobin: 11 g/dL — ABNORMAL LOW (ref 13.0–17.0)
Immature Granulocytes: 1 %
Lymphocytes Relative: 16 %
Lymphs Abs: 1.3 10*3/uL (ref 0.7–4.0)
MCH: 31.3 pg (ref 26.0–34.0)
MCHC: 34.4 g/dL (ref 30.0–36.0)
MCV: 91.2 fL (ref 80.0–100.0)
Monocytes Absolute: 0.9 10*3/uL (ref 0.1–1.0)
Monocytes Relative: 11 %
Neutro Abs: 5.5 10*3/uL (ref 1.7–7.7)
Neutrophils Relative %: 69 %
Platelet Count: 265 10*3/uL (ref 150–400)
RBC: 3.51 MIL/uL — ABNORMAL LOW (ref 4.22–5.81)
RDW: 15.8 % — ABNORMAL HIGH (ref 11.5–15.5)
WBC Count: 7.8 10*3/uL (ref 4.0–10.5)
nRBC: 0 % (ref 0.0–0.2)

## 2020-10-29 LAB — CMP (CANCER CENTER ONLY)
ALT: 12 U/L (ref 0–44)
AST: 16 U/L (ref 15–41)
Albumin: 3.1 g/dL — ABNORMAL LOW (ref 3.5–5.0)
Alkaline Phosphatase: 78 U/L (ref 38–126)
Anion gap: 8 (ref 5–15)
BUN: 28 mg/dL — ABNORMAL HIGH (ref 8–23)
CO2: 30 mmol/L (ref 22–32)
Calcium: 8.8 mg/dL — ABNORMAL LOW (ref 8.9–10.3)
Chloride: 96 mmol/L — ABNORMAL LOW (ref 98–111)
Creatinine: 1.44 mg/dL — ABNORMAL HIGH (ref 0.61–1.24)
GFR, Estimated: 51 mL/min — ABNORMAL LOW (ref >60)
Glucose, Bld: 102 mg/dL — ABNORMAL HIGH (ref 70–99)
Potassium: 3.3 mmol/L — ABNORMAL LOW (ref 3.5–5.1)
Sodium: 134 mmol/L — ABNORMAL LOW (ref 135–145)
Total Bilirubin: 0.4 mg/dL (ref 0.3–1.2)
Total Protein: 6.3 g/dL — ABNORMAL LOW (ref 6.5–8.1)

## 2020-10-29 MED ORDER — SODIUM CHLORIDE 0.9 % IV SOLN
150.0000 mg | Freq: Once | INTRAVENOUS | Status: AC
  Administered 2020-10-29: 150 mg via INTRAVENOUS
  Filled 2020-10-29: qty 150

## 2020-10-29 MED ORDER — SODIUM CHLORIDE 0.9 % IV SOLN
49.5000 mg/m2 | Freq: Once | INTRAVENOUS | Status: AC
  Administered 2020-10-29: 100 mg via INTRAVENOUS
  Filled 2020-10-29: qty 100

## 2020-10-29 MED ORDER — PALONOSETRON HCL INJECTION 0.25 MG/5ML
0.2500 mg | Freq: Once | INTRAVENOUS | Status: AC
  Administered 2020-10-29: 0.25 mg via INTRAVENOUS

## 2020-10-29 MED ORDER — HEPARIN SOD (PORK) LOCK FLUSH 100 UNIT/ML IV SOLN
500.0000 [IU] | Freq: Once | INTRAVENOUS | Status: AC | PRN
  Administered 2020-10-29: 500 [IU]
  Filled 2020-10-29: qty 5

## 2020-10-29 MED ORDER — SODIUM CHLORIDE 0.9 % IV SOLN
800.0000 mg/m2 | Freq: Once | INTRAVENOUS | Status: AC
  Administered 2020-10-29: 1634 mg via INTRAVENOUS
  Filled 2020-10-29: qty 42.98

## 2020-10-29 MED ORDER — SODIUM CHLORIDE 0.9% FLUSH
10.0000 mL | INTRAVENOUS | Status: DC | PRN
  Administered 2020-10-29: 10 mL
  Filled 2020-10-29: qty 10

## 2020-10-29 MED ORDER — SODIUM CHLORIDE 0.9 % IV SOLN
10.0000 mg | Freq: Once | INTRAVENOUS | Status: AC
  Administered 2020-10-29: 10 mg via INTRAVENOUS
  Filled 2020-10-29: qty 10

## 2020-10-29 MED ORDER — SODIUM CHLORIDE 0.9 % IV SOLN
Freq: Once | INTRAVENOUS | Status: AC
  Filled 2020-10-29: qty 250

## 2020-10-29 MED ORDER — SODIUM CHLORIDE 0.9 % IV SOLN
Freq: Once | INTRAVENOUS | Status: AC
  Filled 2020-10-29: qty 10

## 2020-10-29 MED ORDER — PALONOSETRON HCL INJECTION 0.25 MG/5ML
INTRAVENOUS | Status: AC
  Filled 2020-10-29: qty 5

## 2020-10-29 NOTE — Progress Notes (Signed)
Hematology and Oncology Follow Up Visit  Peggy Monk [id number] 05/27/1946 74 y.o. 10/29/2020 8:24 AM [name] [name], [name], FNP   Principle Diagnosis: 74 year old man with bladder cancer diagnosed in August 2021.  He was found to T2N0 high-grade urothelial carcinoma.    Prior Therapy:  He is status post TURBT completed in August 2021 which confirmed the presence of muscle invasion.  Current therapy:  Neoadjuvant chemotherapy utilizing cisplatin and gemcitabine started on September 17, 2020.  He is here for day 1 cycle 3 of therapy.  Interim History: [name] returns today for a follow-up visit.  Since the last visit, he reports no major changes in his health.  He reports of urinary frequency and was diagnosed with UTI and has improved after completing a course of antibiotics.  He denies any recent hospitalization or illnesses.  He denies any nausea vomiting or abdominal pain.  He does report decline in his appetite however.  His performance status and quality of life remains intact.     Medications: Reviewed without changes Current Outpatient Medications  Medication Sig Dispense Refill  . allopurinol (ZYLOPRIM) 300 MG tablet Take 300 mg by mouth daily.    . carvedilol (COREG) 6.25 MG tablet Take 1 tablet (6.25 mg total) by mouth 2 (two) times daily with a meal. 180 tablet 3  . lidocaine-prilocaine (EMLA) cream Apply 1 application topically as needed. 30 g 0  . mirabegron ER (MYRBETRIQ) 25 MG TB24 tablet Take 1 tablet (25 mg total) by mouth daily. 30 tablet 1  . naproxen sodium (ALEVE) 220 MG tablet Take 440 mg by mouth daily as needed (pain).    . Omega-3 Fatty Acids (FISH OIL) 1200 MG CAPS Take 1,200 mg by mouth daily.    Marland Kitchen omeprazole (PRILOSEC) 20 MG capsule Take 20 mg by mouth every Monday, Wednesday, and Friday.     . prochlorperazine (COMPAZINE) 10 MG tablet Take 1 tablet (10 mg total) by mouth every 6 (six) hours as needed for nausea or vomiting. 30 tablet  0  . sildenafil (REVATIO) 20 MG tablet Take 1-3 tablets (20-60 mg total) by mouth daily as needed. 30 tablet 3  . simvastatin (ZOCOR) 40 MG tablet Take 1 tablet (40 mg total) by mouth daily at 6 PM. 90 tablet 3  . tamsulosin (FLOMAX) 0.4 MG CAPS capsule Take 1 capsule (0.4 mg total) by mouth daily. 30 capsule 0  . triamterene-hydrochlorothiazide (MAXZIDE-25) 37.5-25 MG tablet TAKE 1 TABLET BY MOUTH 4 TIMES A WEEK 16 tablet 6   No current facility-administered medications for this visit.   Facility-Administered Medications Ordered in Other Visits  Medication Dose Route Frequency Provider Last Rate Last Admin  . gemcitabine (GEMZAR) chemo syringe for bladder instillation 2,000 mg  2,000 mg Bladder Instillation Once Robley Fries, MD         Allergies:  Allergies  Allergen Reactions  . Amiodarone     fasciculations  . Oxycodone Other (See Comments)    Bad dreams      Physical Exam:  Blood pressure 121/77, pulse 80, temperature (!) 97.3 F (36.3 C), temperature source Tympanic, resp. rate 18, height 5\' 9"  (1.753 m), weight 183 lb 6.4 oz (83.2 kg), SpO2 100 %.    ECOG: 0   General appearance: Comfortable appearing without any discomfort Head: Normocephalic without any trauma Oropharynx: Mucous membranes are moist and pink without any thrush or ulcers. Eyes: Pupils are equal and round reactive to light. Lymph nodes: No cervical, supraclavicular, inguinal or axillary  lymphadenopathy.   Heart:regular rate and rhythm.  S1 and S2 without leg edema. Lung: Clear without any rhonchi or wheezes.  No dullness to percussion. Abdomin: Soft, nontender, nondistended with good bowel sounds.  No hepatosplenomegaly. Musculoskeletal: No joint deformity or effusion.  Full range of motion noted. Neurological: No deficits noted on motor, sensory and deep tendon reflex exam. Skin: No petechial rash or dryness.  Appeared moist.       Lab Results: Lab Results  Component Value Date   WBC  8.6 10/15/2020   HGB 12.9 (L) 10/15/2020   HCT 36.6 (L) 10/15/2020   MCV 88.4 10/15/2020   PLT 299 10/15/2020     Chemistry      Component Value Date/Time   NA 131 (L) 10/15/2020 0814   NA 142 08/24/2019 1415   K 3.7 10/15/2020 0814   CL 95 (L) 10/15/2020 0814   CO2 29 10/15/2020 0814   BUN 27 (H) 10/15/2020 0814   BUN 36 (H) 08/24/2019 1415   CREATININE 1.65 (H) 10/15/2020 0814   CREATININE 1.25 09/26/2014 0921      Component Value Date/Time   CALCIUM 9.8 10/15/2020 0814   ALKPHOS 95 10/15/2020 0814   AST 14 (L) 10/15/2020 0814   ALT 11 10/15/2020 0814   BILITOT 0.4 10/15/2020 0814        Impression and Plan:  74 year old man with:  1.    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     He has completed 2 cycles of neoadjuvant chemotherapy without any major complications.  Risks and benefits of proceeding with cycle 3 of therapy were reviewed.  Potential complications include nausea, vomiting, myelosuppression and worsening renal insufficiency were reviewed.  Tentatively, the plan is to complete 4 cycles and update his staging work-up before consideration for radical cystectomy.    2. IV access: Port-A-Cath currently in use without any issues.   3. Antiemetics: No nausea or vomiting reported at this time.  4. Renal function surveillance:His creatinine clearance to decline currently at around 40 cc/min we will update his creatinine today and adjust plan and dosing accordingly.  5.  Neutropenia: Absolute neutrophil count remains adequate we will continue to monitor on subsequent cycles.  6. Goals of care:Therapy remains curative at this time and aggressive measures are warranted.   7. Follow-up: In 1 week for day 8 of cycle 3 and in 3 weeks for the start of cycle 4 of therapy.  30  minutes were spent on this encounter.  The time was dedicated to reviewing his disease status, discussing treatment options and  complications related to his current therapy.   Zola Button, MD 11/3/20218:24 AM

## 2020-10-29 NOTE — Patient Instructions (Signed)
[facility] Discharge Instructions for Patients Receiving Chemotherapy  Today you received the following chemotherapy agents Gemzar and Cisplatin  To help prevent nausea and vomiting after your treatment, we encourage you to take your nausea medication as directed   If you develop nausea and vomiting that is not controlled by your nausea medication, call the clinic.   BELOW ARE SYMPTOMS THAT SHOULD BE REPORTED IMMEDIATELY:  *FEVER GREATER THAN 100.5 F  *CHILLS WITH OR WITHOUT FEVER  NAUSEA AND VOMITING THAT IS NOT CONTROLLED WITH YOUR NAUSEA MEDICATION  *UNUSUAL SHORTNESS OF BREATH  *UNUSUAL BRUISING OR BLEEDING  TENDERNESS IN MOUTH AND THROAT WITH OR WITHOUT PRESENCE OF ULCERS  *URINARY PROBLEMS  *BOWEL PROBLEMS  UNUSUAL RASH Items with * indicate a potential emergency and should be followed up as soon as possible.  Feel free to call the clinic should you have any questions or concerns. The clinic phone number is [phone].  Please show the CHEMO ALERT CARD at check-in to the Emergency Department and triage nurse.   

## 2020-11-04 ENCOUNTER — Ambulatory Visit: Payer: Medicare Other

## 2020-11-04 ENCOUNTER — Other Ambulatory Visit: Payer: Medicare Other

## 2020-11-05 ENCOUNTER — Inpatient Hospital Stay: Payer: Medicare Other

## 2020-11-05 ENCOUNTER — Other Ambulatory Visit: Payer: Self-pay

## 2020-11-05 ENCOUNTER — Encounter: Payer: Self-pay | Admitting: Oncology

## 2020-11-05 ENCOUNTER — Ambulatory Visit (HOSPITAL_BASED_OUTPATIENT_CLINIC_OR_DEPARTMENT_OTHER): Payer: Medicare Other | Admitting: Medical

## 2020-11-05 VITALS — BP 97/61 | HR 99 | Temp 98.6°F | Resp 18 | Ht 69.0 in | Wt 175.8 lb

## 2020-11-05 DIAGNOSIS — C679 Malignant neoplasm of bladder, unspecified: Secondary | ICD-10-CM

## 2020-11-05 DIAGNOSIS — N289 Disorder of kidney and ureter, unspecified: Secondary | ICD-10-CM

## 2020-11-05 DIAGNOSIS — Z95828 Presence of other vascular implants and grafts: Secondary | ICD-10-CM

## 2020-11-05 DIAGNOSIS — R509 Fever, unspecified: Secondary | ICD-10-CM

## 2020-11-05 DIAGNOSIS — Z5111 Encounter for antineoplastic chemotherapy: Secondary | ICD-10-CM | POA: Diagnosis not present

## 2020-11-05 LAB — CBC WITH DIFFERENTIAL (CANCER CENTER ONLY)
Abs Immature Granulocytes: 0.05 10*3/uL (ref 0.00–0.07)
Basophils Absolute: 0 10*3/uL (ref 0.0–0.1)
Basophils Relative: 0 %
Eosinophils Absolute: 0 10*3/uL (ref 0.0–0.5)
Eosinophils Relative: 0 %
HCT: 35 % — ABNORMAL LOW (ref 39.0–52.0)
Hemoglobin: 12.1 g/dL — ABNORMAL LOW (ref 13.0–17.0)
Immature Granulocytes: 1 %
Lymphocytes Relative: 6 %
Lymphs Abs: 0.5 10*3/uL — ABNORMAL LOW (ref 0.7–4.0)
MCH: 31.3 pg (ref 26.0–34.0)
MCHC: 34.6 g/dL (ref 30.0–36.0)
MCV: 90.4 fL (ref 80.0–100.0)
Monocytes Absolute: 0.5 10*3/uL (ref 0.1–1.0)
Monocytes Relative: 6 %
Neutro Abs: 7.2 10*3/uL (ref 1.7–7.7)
Neutrophils Relative %: 87 %
Platelet Count: 226 10*3/uL (ref 150–400)
RBC: 3.87 MIL/uL — ABNORMAL LOW (ref 4.22–5.81)
RDW: 15.9 % — ABNORMAL HIGH (ref 11.5–15.5)
WBC Count: 8.3 10*3/uL (ref 4.0–10.5)
nRBC: 0 % (ref 0.0–0.2)

## 2020-11-05 LAB — CMP (CANCER CENTER ONLY)
ALT: 13 U/L (ref 0–44)
AST: 20 U/L (ref 15–41)
Albumin: 3.2 g/dL — ABNORMAL LOW (ref 3.5–5.0)
Alkaline Phosphatase: 83 U/L (ref 38–126)
Anion gap: 13 (ref 5–15)
BUN: 38 mg/dL — ABNORMAL HIGH (ref 8–23)
CO2: 25 mmol/L (ref 22–32)
Calcium: 9.1 mg/dL (ref 8.9–10.3)
Chloride: 91 mmol/L — ABNORMAL LOW (ref 98–111)
Creatinine: 2.25 mg/dL — ABNORMAL HIGH (ref 0.61–1.24)
GFR, Estimated: 30 mL/min — ABNORMAL LOW (ref >60)
Glucose, Bld: 132 mg/dL — ABNORMAL HIGH (ref 70–99)
Potassium: 3.8 mmol/L (ref 3.5–5.1)
Sodium: 129 mmol/L — ABNORMAL LOW (ref 135–145)
Total Bilirubin: 0.8 mg/dL (ref 0.3–1.2)
Total Protein: 7.2 g/dL (ref 6.5–8.1)

## 2020-11-05 MED ORDER — SODIUM CHLORIDE 0.9 % IV SOLN
Freq: Once | INTRAVENOUS | Status: AC
  Filled 2020-11-05: qty 250

## 2020-11-05 MED ORDER — PROCHLORPERAZINE MALEATE 10 MG PO TABS
ORAL_TABLET | ORAL | Status: AC
  Filled 2020-11-05: qty 1

## 2020-11-05 MED ORDER — ACETAMINOPHEN 325 MG PO TABS
ORAL_TABLET | ORAL | Status: AC
  Filled 2020-11-05: qty 2

## 2020-11-05 MED ORDER — ACETAMINOPHEN 325 MG PO TABS
650.0000 mg | ORAL_TABLET | Freq: Once | ORAL | Status: AC
  Administered 2020-11-05: 650 mg via ORAL

## 2020-11-05 MED ORDER — SODIUM CHLORIDE 0.9% FLUSH
10.0000 mL | INTRAVENOUS | Status: DC | PRN
  Administered 2020-11-05: 10 mL
  Filled 2020-11-05: qty 10

## 2020-11-05 MED ORDER — SODIUM CHLORIDE 0.9% FLUSH
10.0000 mL | Freq: Once | INTRAVENOUS | Status: AC
  Administered 2020-11-05: 10 mL
  Filled 2020-11-05: qty 10

## 2020-11-05 MED ORDER — PROCHLORPERAZINE MALEATE 10 MG PO TABS
10.0000 mg | ORAL_TABLET | Freq: Once | ORAL | Status: AC
  Administered 2020-11-05: 10 mg via ORAL

## 2020-11-05 MED ORDER — HEPARIN SOD (PORK) LOCK FLUSH 100 UNIT/ML IV SOLN
500.0000 [IU] | Freq: Once | INTRAVENOUS | Status: AC | PRN
  Administered 2020-11-05: 500 [IU]
  Filled 2020-11-05: qty 5

## 2020-11-05 MED ORDER — SODIUM CHLORIDE 0.9 % IV SOLN
800.0000 mg/m2 | Freq: Once | INTRAVENOUS | Status: AC
  Administered 2020-11-05: 1634 mg via INTRAVENOUS
  Filled 2020-11-05: qty 42.98

## 2020-11-05 NOTE — Patient Instructions (Signed)
[facility] °Discharge Instructions for Patients Receiving Chemotherapy ° °Today you received the following chemotherapy agents Gemzar ° °To help prevent nausea and vomiting after your treatment, we encourage you to take your nausea medication as directed. °  °If you develop nausea and vomiting that is not controlled by your nausea medication, call the clinic.  ° °BELOW ARE SYMPTOMS THAT SHOULD BE REPORTED IMMEDIATELY: °· *FEVER GREATER THAN 100.5 F °· *CHILLS WITH OR WITHOUT FEVER °· NAUSEA AND VOMITING THAT IS NOT CONTROLLED WITH YOUR NAUSEA MEDICATION °· *UNUSUAL SHORTNESS OF BREATH °· *UNUSUAL BRUISING OR BLEEDING °· TENDERNESS IN MOUTH AND THROAT WITH OR WITHOUT PRESENCE OF ULCERS °· *URINARY PROBLEMS °· *BOWEL PROBLEMS °· UNUSUAL RASH °Items with * indicate a potential emergency and should be followed up as soon as possible. ° °Feel free to call the clinic should you have any questions or concerns. The clinic phone number is [phone]. ° °Please show the CHEMO ALERT CARD at check-in to the Emergency Department and triage nurse. ° ° °

## 2020-11-05 NOTE — Progress Notes (Signed)
[facility] Progress Note   Cipriano Millikan [id number] Dec 31, 1945 74 y.o.  Ciaran Begay is managed by Dr. Zola Button  Actively treated with chemotherapy/immunotherapy/hormonal therapy: yes  Current therapy: Cisplatin and gemcitabine  Last treated: 10/29/2020 (cycle 3, day 1)  Next scheduled appointment with provider: 11/19/2020  Assessment: Plan:    Malignant neoplasm of urinary bladder, unspecified site (HCC)  Fever, unspecified fever cause  Renal insufficiency   T2N0 high-grade urothelial carcinoma: Mr. Flanagin was seen in the infusion room today as he was receiving cycle 3, day 8 of chemotherapy.  He is receiving gemcitabine only today.  He is scheduled to be seen by Dr. Alen Blew in follow-up on 11/19/2020.  Fever: Patient was noted to have a temperature of 100.5 today.  He denies having any temperatures at home.  He was given Tylenol 650 mg p.o. x1.  He proceeded with chemotherapy today and was told to begin checking his temperature and to call or return should he have 1 temperature reading of 100.5 or multiple temperature readings of 100.4 over a 12-hour period.  He expressed understanding and agreement with this plan.  Renal insufficiency: Patient's creatinine returned at 2.44 today with a BUN of 28.  He reports that he has been feeling fatigued with increased anorexia since his last treatment.  He is also noted to have a weight loss of approximately 8 pounds since his last visit.  He was given an additional 500 mL of normal saline with his treatment today.  His labs will be rechecked on his return..  Please see After Visit Summary for patient specific instructions.  Future Appointments  Date Time Provider [place]  11/19/2020  8:15 AM [facility] None  11/19/2020  8:30 AM [facility] [place] FLUSH [facility] None  11/19/2020  9:00 AM Wyatt Portela, MD [facility] None  11/19/2020 10:00 AM [facility] INFUSION [facility] None    11/26/2020  8:00 AM [facility] LAB [facility] None  11/26/2020  8:15 AM [facility] [place] FLUSH [facility] None  11/26/2020  9:15 AM [facility] INFUSION [facility] None    No orders of the defined types were placed in this encounter.      Subjective:   Patient ID:  Hunter [name] is a 74 y.o. (DOB [birth year]) male.  Chief Complaint: No chief complaint on file.   HPI Hassaan Crite is a 74 y.o. male with a diagnosis of a T2N0 high-grade urothelial carcinoma.  He is followed by Dr. Alen Blew and is treated with gemcitabine and cisplatin.  He presents today for cycle 3, day 8 of chemotherapy and is receiving gemcitabine only.  He reports that he has had increased fatigue and anorexia since his last visit.  He has had an approximate 8 pound weight loss since his last treatment.  He was noted to have vital signs that included a blood pressure of 126/80, pulse 109, respirations 18, and temperature of 100.5.  His labs today returned showing that his creatinine was elevated at 2.44 with a BUN of 28.  This is up from 1.44 and 28 respectively at his last visit.  He would like to proceed with his chemotherapy today.  He is agreeable to receive additional IV fluids today.   Medications: I have reviewed the patient's current medications.  Allergies:  Allergies  Allergen Reactions  . Amiodarone     fasciculations  . Oxycodone Other (See Comments)    Bad dreams    Past Medical History:  Diagnosis Date  . 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 Intol amio, was on Sotalol, now on Lopressor  . Bladder cancer ([place]) dx'd 10/2019  . Bursitis of right shoulder   . CAD (coronary artery disease) 11/2012   CABG x 4 using left internal mammary artery and right endovein harvest. (LIMA-LAD; VG-OM1, OM2, VG-PD), EF 55-60% at cath  . Degenerative disc disease, lumbar   . Epigastric pain    Myoview 11/09/12 Showed evidence of reversible ischemia in both the inferior wall & the  anteroapical distribution.   . Erectile dysfunction    H/O ED while taking a beta-blocker  . GERD (gastroesophageal reflux disease)   . Gout   . Hiatal hernia   . Hyperlipidemia   . Hypertension    Systemic HTN. Pt has H/O of ED when taking a beta-blocker. ECHO 11/26/08 Showed no evidence of any significant valvular disease, Estimated EF = 50-60%.  . Kidney stone    " i have  6-7 stones imbedded in my left kidney "   . Mononeuritis of unspecified site   . Nocturia   . NSVT (nonsustained ventricular tachycardia) ([place])   . Obesity   . 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 PAF (paroxysmal atrial fibrillation) (HCC)    Post CABG. Amia intol. Was on Sotalol, now on Lopressor  . Polio    Age 46. 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 Seasonal allergies   . significant CAD requiring CABG 12/15/2012  . Skin cancer (melanoma) ([place]) 08/04/2015   left side of face   . Skin lesion of face    hx of    Past Surgical History:  Procedure Laterality Date  . CARDIAC CATHETERIZATION     12/08/2012  . COLONOSCOPY    . CORONARY ARTERY BYPASS GRAFT  12/12/2012   CABG x 4 using left internal mammary artery and right endovein harvest. (LIMA-LAD; VG-OM1, OM2, VG-PD)  . EYE SURGERY     had lasik surgery bilaterally 1996  . IR IMAGING GUIDED PORT INSERTION  09/10/2020  . LEFT HEART CATHETERIZATION WITH CORONARY ANGIOGRAM N/A 12/08/2012   Procedure: LEFT HEART CATHETERIZATION WITH CORONARY ANGIOGRAM;  Surgeon: Sanda Klein, MD;  Location: [facility];  Service: Cardiovascular;  Laterality: N/A;  . SHOULDER SURGERY     for effects of polio  . SKIN CANCER EXCISION  2016   left side of face   . TRANSURETHRAL RESECTION OF BLADDER TUMOR N/A 11/16/2019   Procedure: TRANSURETHRAL RESECTION OF BLADDER TUMOR (TURBT) WITH INSTILLATION OF POST OPERATIVE CHEMOTHERAPY;  Surgeon: Kathie Rhodes, MD;   Location: [facility];  Service: Urology;  Laterality: N/A;  . TRANSURETHRAL RESECTION OF BLADDER TUMOR WITH MITOMYCIN-C N/A 08/12/2020   Procedure: TRANSURETHRAL RESECTION OF BLADDER TUMOR WITH GEMCITABINE;  Surgeon: Robley Fries, MD;  Location: [facility];  Service: Urology;  Laterality: N/A;  1 HR  . UPPER GI ENDOSCOPY      Family History  Problem Relation Age of Onset  . CAD Father   . Hypertension Father   . AAA (abdominal aortic aneurysm) Father   . Aneurysm Father        Cause of death  . Heart attack Paternal Grandfather   . Cancer Mother        Colon. Cause of death    Social History   Socioeconomic History  . Marital status: Married    Spouse name: Not on file  .  Number of children: 3  . Years of education: Not on file  . Highest education level: Not on file  Occupational History  . Occupation: Higher education careers adviser (RETIRED)    Employer: [employer]  Tobacco Use  . Smoking status: Never Smoker  . Smokeless tobacco: Never Used  Substance and Sexual Activity  . Alcohol use: Yes    Comment: social, very little amount, tequila  . Drug use: No  . Sexual activity: Not on file  Other Topics Concern  . Not on file  Social History Narrative  . Not on file   Social Determinants of Health   Financial Resource Strain:   . Difficulty of Paying Living Expenses: Not on file  Food Insecurity:   . Worried About Charity fundraiser in the Last Year: Not on file  . Ran Out of Food in the Last Year: Not on file  Transportation Needs:   . Lack of Transportation (Medical): Not on file  . Lack of Transportation (Non-Medical): Not on file  Physical Activity:   . Days of Exercise per Week: Not on file  . Minutes of Exercise per Session: Not on file  Stress:   . Feeling of Stress : Not on file  Social Connections:   . Frequency of Communication with Friends and Family: Not on file  . Frequency of Social Gatherings with Friends and Family: Not on file  . Attends Religious Services:  Not on file  . Active Member of Clubs or Organizations: Not on file  . Attends Archivist Meetings: Not on file  . Marital Status: Not on file  Intimate Partner Violence:   . Fear of Current or Ex-Partner: Not on file  . Emotionally Abused: Not on file  . Physically Abused: Not on file  . Sexually Abused: Not on file    Past Medical History, Surgical history, Social history, and Family history were reviewed and updated as appropriate.   Please see review of systems for further details on the patient's review from today.   Review of Systems:  Review of Systems  Constitutional: Positive for appetite change, fatigue, fever and unexpected weight change. Negative for chills and diaphoresis.  HENT: Negative for trouble swallowing and voice change.   Respiratory: Negative for cough, chest tightness, shortness of breath and wheezing.   Cardiovascular: Negative for chest pain and palpitations.  Gastrointestinal: Negative for abdominal pain, constipation, diarrhea, nausea and vomiting.  Musculoskeletal: Negative for back pain and myalgias.  Neurological: Negative for dizziness, light-headedness and headaches.    Objective:   Physical Exam:  There were no vitals taken for this visit. ECOG: 0  Physical Exam Constitutional:      General: He is not in acute distress.    Appearance: He is not diaphoretic.  HENT:     Head: Normocephalic and atraumatic.     Right Ear: External ear normal.     Left Ear: External ear normal.     Mouth/Throat:     Pharynx: No oropharyngeal exudate.  Eyes:     General: No scleral icterus.       Right eye: No discharge.        Left eye: No discharge.     Conjunctiva/sclera: Conjunctivae normal.  Cardiovascular:     Rate and Rhythm: Regular rhythm. Tachycardia present.     Heart sounds: Normal heart sounds. No murmur heard.  No friction rub. No gallop.   Pulmonary:     Effort: Pulmonary effort is normal. No respiratory distress.  Breath  sounds: Normal breath sounds. No wheezing or rales.  Musculoskeletal:     Cervical back: Normal range of motion and neck supple.  Lymphadenopathy:     Cervical: No cervical adenopathy.  Skin:    General: Skin is warm and dry.     Findings: No erythema or rash.  Neurological:     Mental Status: He is alert.     Coordination: Coordination normal.  Psychiatric:        Behavior: Behavior normal.        Thought Content: Thought content normal.        Judgment: Judgment normal.     Lab Review:     Component Value Date/Time   NA 129 (L) 11/05/2020 1355   NA 142 08/24/2019 1415   K 3.8 11/05/2020 1355   CL 91 (L) 11/05/2020 1355   CO2 25 11/05/2020 1355   GLUCOSE 132 (H) 11/05/2020 1355   BUN 38 (H) 11/05/2020 1355   BUN 36 (H) 08/24/2019 1415   CREATININE 2.25 (H) 11/05/2020 1355   CREATININE 1.25 09/26/2014 0921   CALCIUM 9.1 11/05/2020 1355   PROT 7.2 11/05/2020 1355   ALBUMIN 3.2 (L) 11/05/2020 1355   AST 20 11/05/2020 1355   ALT 13 11/05/2020 1355   ALKPHOS 83 11/05/2020 1355   BILITOT 0.8 11/05/2020 1355   GFRNONAA 30 (L) 11/05/2020 1355   GFRAA >60 09/23/2020 1149       Component Value Date/Time   WBC 8.3 11/05/2020 1355   WBC 7.8 09/10/2020 1320   RBC 3.87 (L) 11/05/2020 1355   HGB 12.1 (L) 11/05/2020 1355   HCT 35.0 (L) 11/05/2020 1355   PLT 226 11/05/2020 1355   MCV 90.4 11/05/2020 1355   MCH 31.3 11/05/2020 1355   MCHC 34.6 11/05/2020 1355   RDW 15.9 (H) 11/05/2020 1355   LYMPHSABS 0.5 (L) 11/05/2020 1355   MONOABS 0.5 11/05/2020 1355   EOSABS 0.0 11/05/2020 1355   BASOSABS 0.0 11/05/2020 1355   -------------------------------  Imaging from last 24 hours (if applicable):  Radiology interpretation: No results found.      This case was discussed Dr. Alen Blew. He expressed agreement with my management of this patient.

## 2020-11-05 NOTE — Progress Notes (Signed)
Per Sandi Mealy, PA ok to treat with labs and VS today. Pt getting IVF bolus (see eMAR) today.

## 2020-11-05 NOTE — Patient Instructions (Signed)

## 2020-11-05 NOTE — Progress Notes (Signed)
Met with patient at registration to introduce myself as Financial Resource Specialist and to offer available resources.  Discussed one-time $1000 [employer] grant and qualifications to assist with personal expenses while going through treatment.  Gave him my card if interested in applying and for any additional financial questions or concerns.  

## 2020-11-06 ENCOUNTER — Inpatient Hospital Stay: Payer: Medicare Other

## 2020-11-06 ENCOUNTER — Other Ambulatory Visit: Payer: Self-pay | Admitting: *Deleted

## 2020-11-06 ENCOUNTER — Other Ambulatory Visit: Payer: Self-pay

## 2020-11-06 ENCOUNTER — Telehealth: Payer: Self-pay | Admitting: *Deleted

## 2020-11-06 DIAGNOSIS — R509 Fever, unspecified: Secondary | ICD-10-CM

## 2020-11-06 DIAGNOSIS — Z5111 Encounter for antineoplastic chemotherapy: Secondary | ICD-10-CM | POA: Diagnosis not present

## 2020-11-06 DIAGNOSIS — C679 Malignant neoplasm of bladder, unspecified: Secondary | ICD-10-CM

## 2020-11-06 LAB — URINALYSIS, COMPLETE (UACMP) WITH MICROSCOPIC
Bilirubin Urine: NEGATIVE
Glucose, UA: NEGATIVE mg/dL
Ketones, ur: NEGATIVE mg/dL
Nitrite: NEGATIVE
Protein, ur: 100 mg/dL — AB
Specific Gravity, Urine: 1.012 (ref 1.005–1.030)
WBC, UA: >50 WBC/hpf — ABNORMAL HIGH (ref 0–5)
pH: 5 (ref 5.0–8.0)

## 2020-11-06 NOTE — Telephone Encounter (Signed)
Continue to monitor and report any temperature above 101.

## 2020-11-06 NOTE — Progress Notes (Signed)
Patients wife called back to advise that patient has started back again with the fever now back up to 100 and patient is complaining of chills.  Wife is requesting that we do a repeat urinalysis to exclude recurrent UTI.

## 2020-11-06 NOTE — Telephone Encounter (Signed)
Patients wife called to report continued fever during the night, reading @ 07:00am 101.  She has given Tylenol.  He had treatment yesterday and presented with a fever and was instructed to call if persisted.  Reading as of return call to patient was temp 97.   He had UTI around 10/20 and took antibiotics and resolved.  Today denies having any pain with urination, odor or abdominal pain.  He does report increased frequency.  Patient also has portacath, states normal in appearance.  Denies any other issues.  Routed to MD to advise.

## 2020-11-06 NOTE — Telephone Encounter (Signed)
Communicated  Dr. Hazeline Junker response to wife.  No further questions.

## 2020-11-08 LAB — URINE CULTURE: Culture: 80000 — AB

## 2020-11-09 ENCOUNTER — Other Ambulatory Visit: Payer: Self-pay | Admitting: Hematology

## 2020-11-09 ENCOUNTER — Telehealth: Payer: Self-pay | Admitting: Hematology

## 2020-11-09 NOTE — Telephone Encounter (Signed)
Patient's wife [name] called, reported patient has worsening fatigue since last cycle chemotherapy, he is not able to do anything else except taking care of his ADLs.  He also has decreased appetite.  No recurrent fever since last visit here on 11/10.  He was treated for UTI a month ago, urinary frequency has improved, no dysuria.  Urine culture was obtained 11/10, which came back positive for staph epidermidis, same as last culture on October 15, 2020.  He is lab also showed worsening Cr 2.25 (previous 1.44), patient has been drinking more fluids since then, about 64 ounces daily.  I am not convinced that he has UTI at this point, based on his clinical symptoms. Due to his AKI antibiotics choice will be also limited. I will let pt's primary oncologist Dr. Alen Blew know and decide if he needs office visit or call in antibiotics for him tomorrow morning.   Truitt Merle  11/09/2020 8:31 PM

## 2020-11-10 ENCOUNTER — Other Ambulatory Visit: Payer: Self-pay | Admitting: *Deleted

## 2020-11-10 ENCOUNTER — Other Ambulatory Visit: Payer: Self-pay

## 2020-11-10 ENCOUNTER — Inpatient Hospital Stay (HOSPITAL_BASED_OUTPATIENT_CLINIC_OR_DEPARTMENT_OTHER): Payer: Medicare Other | Admitting: Medical

## 2020-11-10 ENCOUNTER — Inpatient Hospital Stay (HOSPITAL_COMMUNITY)
Admission: AD | Admit: 2020-11-10 | Discharge: 2020-11-15 | DRG: 871 | Disposition: A | Discharge status: Home Health | Payer: Medicare Other | Source: Ambulatory Visit | Attending: Internal Medicine | Admitting: Internal Medicine

## 2020-11-10 ENCOUNTER — Encounter (HOSPITAL_COMMUNITY): Payer: Self-pay | Admitting: Internal Medicine

## 2020-11-10 ENCOUNTER — Inpatient Hospital Stay (HOSPITAL_COMMUNITY): Payer: Medicare Other

## 2020-11-10 ENCOUNTER — Telehealth: Payer: Self-pay

## 2020-11-10 VITALS — BP 99/55 | HR 126 | Temp 99.5°F | Resp 18 | Ht 69.0 in | Wt 177.0 lb

## 2020-11-10 DIAGNOSIS — Z85820 Personal history of malignant melanoma of skin: Secondary | ICD-10-CM

## 2020-11-10 DIAGNOSIS — E876 Hypokalemia: Secondary | ICD-10-CM | POA: Diagnosis present

## 2020-11-10 DIAGNOSIS — I10 Essential (primary) hypertension: Secondary | ICD-10-CM | POA: Diagnosis not present

## 2020-11-10 DIAGNOSIS — N390 Urinary tract infection, site not specified: Secondary | ICD-10-CM

## 2020-11-10 DIAGNOSIS — N179 Acute kidney failure, unspecified: Secondary | ICD-10-CM

## 2020-11-10 DIAGNOSIS — Z8612 Personal history of poliomyelitis: Secondary | ICD-10-CM

## 2020-11-10 DIAGNOSIS — E860 Dehydration: Secondary | ICD-10-CM | POA: Diagnosis present

## 2020-11-10 DIAGNOSIS — M1711 Unilateral primary osteoarthritis, right knee: Secondary | ICD-10-CM | POA: Diagnosis present

## 2020-11-10 DIAGNOSIS — Z66 Do not resuscitate: Secondary | ICD-10-CM | POA: Diagnosis present

## 2020-11-10 DIAGNOSIS — C679 Malignant neoplasm of bladder, unspecified: Secondary | ICD-10-CM

## 2020-11-10 DIAGNOSIS — Z20822 Contact with and (suspected) exposure to covid-19: Secondary | ICD-10-CM | POA: Diagnosis present

## 2020-11-10 DIAGNOSIS — I129 Hypertensive chronic kidney disease with stage 1 through stage 4 chronic kidney disease, or unspecified chronic kidney disease: Secondary | ICD-10-CM | POA: Diagnosis present

## 2020-11-10 DIAGNOSIS — I4819 Other persistent atrial fibrillation: Secondary | ICD-10-CM | POA: Diagnosis present

## 2020-11-10 DIAGNOSIS — E871 Hypo-osmolality and hyponatremia: Secondary | ICD-10-CM

## 2020-11-10 DIAGNOSIS — N136 Pyonephrosis: Secondary | ICD-10-CM | POA: Diagnosis present

## 2020-11-10 DIAGNOSIS — I959 Hypotension, unspecified: Secondary | ICD-10-CM | POA: Diagnosis not present

## 2020-11-10 DIAGNOSIS — N1831 Chronic kidney disease, stage 3a: Secondary | ICD-10-CM | POA: Diagnosis present

## 2020-11-10 DIAGNOSIS — I2510 Atherosclerotic heart disease of native coronary artery without angina pectoris: Secondary | ICD-10-CM | POA: Diagnosis not present

## 2020-11-10 DIAGNOSIS — Z1611 Resistance to penicillins: Secondary | ICD-10-CM | POA: Diagnosis present

## 2020-11-10 DIAGNOSIS — Z9221 Personal history of antineoplastic chemotherapy: Secondary | ICD-10-CM

## 2020-11-10 DIAGNOSIS — K219 Gastro-esophageal reflux disease without esophagitis: Secondary | ICD-10-CM | POA: Diagnosis present

## 2020-11-10 DIAGNOSIS — M25551 Pain in right hip: Secondary | ICD-10-CM

## 2020-11-10 DIAGNOSIS — Z87442 Personal history of urinary calculi: Secondary | ICD-10-CM

## 2020-11-10 DIAGNOSIS — M109 Gout, unspecified: Secondary | ICD-10-CM | POA: Diagnosis present

## 2020-11-10 DIAGNOSIS — J302 Other seasonal allergic rhinitis: Secondary | ICD-10-CM | POA: Diagnosis present

## 2020-11-10 DIAGNOSIS — Z951 Presence of aortocoronary bypass graft: Secondary | ICD-10-CM | POA: Diagnosis not present

## 2020-11-10 DIAGNOSIS — A419 Sepsis, unspecified organism: Secondary | ICD-10-CM | POA: Diagnosis present

## 2020-11-10 DIAGNOSIS — Z8249 Family history of ischemic heart disease and other diseases of the circulatory system: Secondary | ICD-10-CM

## 2020-11-10 DIAGNOSIS — R6521 Severe sepsis with septic shock: Secondary | ICD-10-CM | POA: Diagnosis present

## 2020-11-10 DIAGNOSIS — Z6826 Body mass index (BMI) 26.0-26.9, adult: Secondary | ICD-10-CM

## 2020-11-10 DIAGNOSIS — E785 Hyperlipidemia, unspecified: Secondary | ICD-10-CM | POA: Diagnosis present

## 2020-11-10 DIAGNOSIS — I480 Paroxysmal atrial fibrillation: Secondary | ICD-10-CM

## 2020-11-10 DIAGNOSIS — R110 Nausea: Secondary | ICD-10-CM

## 2020-11-10 DIAGNOSIS — D631 Anemia in chronic kidney disease: Secondary | ICD-10-CM | POA: Diagnosis present

## 2020-11-10 DIAGNOSIS — E1122 Type 2 diabetes mellitus with diabetic chronic kidney disease: Secondary | ICD-10-CM | POA: Diagnosis present

## 2020-11-10 DIAGNOSIS — Z885 Allergy status to narcotic agent status: Secondary | ICD-10-CM

## 2020-11-10 DIAGNOSIS — I4891 Unspecified atrial fibrillation: Secondary | ICD-10-CM | POA: Diagnosis not present

## 2020-11-10 DIAGNOSIS — Z888 Allergy status to other drugs, medicaments and biological substances status: Secondary | ICD-10-CM

## 2020-11-10 DIAGNOSIS — E669 Obesity, unspecified: Secondary | ICD-10-CM | POA: Diagnosis present

## 2020-11-10 DIAGNOSIS — N529 Male erectile dysfunction, unspecified: Secondary | ICD-10-CM | POA: Diagnosis present

## 2020-11-10 DIAGNOSIS — Z87440 Personal history of urinary (tract) infections: Secondary | ICD-10-CM

## 2020-11-10 LAB — CREATININE, URINE, RANDOM: Creatinine, Urine: 54.57 mg/dL

## 2020-11-10 LAB — CBC WITH DIFFERENTIAL (CANCER CENTER ONLY)
Abs Immature Granulocytes: 0.02 10*3/uL (ref 0.00–0.07)
Basophils Absolute: 0 10*3/uL (ref 0.0–0.1)
Basophils Relative: 1 %
Eosinophils Absolute: 0 10*3/uL (ref 0.0–0.5)
Eosinophils Relative: 0 %
HCT: 26.6 % — ABNORMAL LOW (ref 39.0–52.0)
Hemoglobin: 9.6 g/dL — ABNORMAL LOW (ref 13.0–17.0)
Immature Granulocytes: 1 %
Lymphocytes Relative: 9 %
Lymphs Abs: 0.2 10*3/uL — ABNORMAL LOW (ref 0.7–4.0)
MCH: 31.4 pg (ref 26.0–34.0)
MCHC: 36.1 g/dL — ABNORMAL HIGH (ref 30.0–36.0)
MCV: 86.9 fL (ref 80.0–100.0)
Monocytes Absolute: 0.1 10*3/uL (ref 0.1–1.0)
Monocytes Relative: 4 %
Neutro Abs: 2.2 10*3/uL (ref 1.7–7.7)
Neutrophils Relative %: 85 %
Platelet Count: 84 10*3/uL — ABNORMAL LOW (ref 150–400)
RBC: 3.06 MIL/uL — ABNORMAL LOW (ref 4.22–5.81)
RDW: 15.2 % (ref 11.5–15.5)
WBC Count: 2.6 10*3/uL — ABNORMAL LOW (ref 4.0–10.5)
nRBC: 0 % (ref 0.0–0.2)

## 2020-11-10 LAB — RESPIRATORY PANEL BY RT PCR (FLU A&B, COVID)
Influenza A by PCR: NEGATIVE
Influenza B by PCR: NEGATIVE
SARS Coronavirus 2 by RT PCR: NEGATIVE

## 2020-11-10 LAB — CMP (CANCER CENTER ONLY)
ALT: 46 U/L — ABNORMAL HIGH (ref 0–44)
AST: 66 U/L — ABNORMAL HIGH (ref 15–41)
Albumin: 2.4 g/dL — ABNORMAL LOW (ref 3.5–5.0)
Alkaline Phosphatase: 92 U/L (ref 38–126)
Anion gap: 13 (ref 5–15)
BUN: 62 mg/dL — ABNORMAL HIGH (ref 8–23)
CO2: 21 mmol/L — ABNORMAL LOW (ref 22–32)
Calcium: 8.8 mg/dL — ABNORMAL LOW (ref 8.9–10.3)
Chloride: 84 mmol/L — ABNORMAL LOW (ref 98–111)
Creatinine: 2.89 mg/dL — ABNORMAL HIGH (ref 0.61–1.24)
GFR, Estimated: 22 mL/min — ABNORMAL LOW (ref >60)
Glucose, Bld: 184 mg/dL — ABNORMAL HIGH (ref 70–99)
Potassium: 3.2 mmol/L — ABNORMAL LOW (ref 3.5–5.1)
Sodium: 118 mmol/L — CL (ref 135–145)
Total Bilirubin: 0.7 mg/dL (ref 0.3–1.2)
Total Protein: 6.5 g/dL (ref 6.5–8.1)

## 2020-11-10 LAB — BASIC METABOLIC PANEL
Anion gap: 12 (ref 5–15)
BUN: 63 mg/dL — ABNORMAL HIGH (ref 8–23)
CO2: 22 mmol/L (ref 22–32)
Calcium: 8.3 mg/dL — ABNORMAL LOW (ref 8.9–10.3)
Chloride: 87 mmol/L — ABNORMAL LOW (ref 98–111)
Creatinine, Ser: 2.86 mg/dL — ABNORMAL HIGH (ref 0.61–1.24)
GFR, Estimated: 22 mL/min — ABNORMAL LOW (ref >60)
Glucose, Bld: 145 mg/dL — ABNORMAL HIGH (ref 70–99)
Potassium: 4 mmol/L (ref 3.5–5.1)
Sodium: 121 mmol/L — ABNORMAL LOW (ref 135–145)

## 2020-11-10 LAB — HEMOGLOBIN A1C
Hgb A1c MFr Bld: 6.3 % — ABNORMAL HIGH (ref 4.8–5.6)
Mean Plasma Glucose: 134.11 mg/dL

## 2020-11-10 LAB — URINALYSIS, COMPLETE (UACMP) WITH MICROSCOPIC
Bilirubin Urine: NEGATIVE
Glucose, UA: NEGATIVE mg/dL
Ketones, ur: NEGATIVE mg/dL
Nitrite: NEGATIVE
Protein, ur: 100 mg/dL — AB
Specific Gravity, Urine: 1.009 (ref 1.005–1.030)
WBC, UA: >50 WBC/hpf — ABNORMAL HIGH (ref 0–5)
pH: 6 (ref 5.0–8.0)

## 2020-11-10 LAB — APTT: aPTT: 31 seconds (ref 24–36)

## 2020-11-10 LAB — OSMOLALITY, URINE: Osmolality, Ur: 296 mOsm/kg — ABNORMAL LOW (ref 300–900)

## 2020-11-10 LAB — SODIUM, URINE, RANDOM: Sodium, Ur: 22 mmol/L

## 2020-11-10 LAB — SODIUM: Sodium: 122 mmol/L — ABNORMAL LOW (ref 135–145)

## 2020-11-10 LAB — MAGNESIUM: Magnesium: 2.2 mg/dL (ref 1.7–2.4)

## 2020-11-10 MED ORDER — HEPARIN (PORCINE) 25000 UT/250ML-% IV SOLN
1200.0000 [IU]/h | INTRAVENOUS | Status: DC
  Administered 2020-11-10: 1200 [IU]/h via INTRAVENOUS
  Filled 2020-11-10 (×2): qty 250

## 2020-11-10 MED ORDER — ACETAMINOPHEN 650 MG RE SUPP: 650.0000 mg | Freq: Four times a day (QID) | RECTAL | Status: DC | PRN

## 2020-11-10 MED ORDER — SIMVASTATIN 40 MG PO TABS
40.0000 mg | ORAL_TABLET | Freq: Every day | ORAL | Status: DC
  Administered 2020-11-11 – 2020-11-12 (×2): 40 mg via ORAL
  Filled 2020-11-10 (×2): qty 1

## 2020-11-10 MED ORDER — ONDANSETRON HCL 4 MG/2ML IJ SOLN
8.0000 mg | Freq: Once | INTRAMUSCULAR | Status: AC
  Administered 2020-11-10: 8 mg via INTRAVENOUS

## 2020-11-10 MED ORDER — SODIUM CHLORIDE 0.9 % IV SOLN: INTRAVENOUS | Status: DC

## 2020-11-10 MED ORDER — DOXYCYCLINE HYCLATE 100 MG PO TABS
100.0000 mg | ORAL_TABLET | Freq: Two times a day (BID) | ORAL | Status: AC
  Administered 2020-11-10 – 2020-11-15 (×10): 100 mg via ORAL
  Filled 2020-11-10 (×10): qty 1

## 2020-11-10 MED ORDER — ACETAMINOPHEN 325 MG PO TABS
650.0000 mg | ORAL_TABLET | Freq: Four times a day (QID) | ORAL | Status: DC | PRN
  Administered 2020-11-11 – 2020-11-15 (×6): 650 mg via ORAL
  Filled 2020-11-10 (×6): qty 2

## 2020-11-10 MED ORDER — SODIUM CHLORIDE 0.9% FLUSH
3.0000 mL | Freq: Two times a day (BID) | INTRAVENOUS | Status: DC
  Administered 2020-11-10 – 2020-11-15 (×6): 3 mL via INTRAVENOUS

## 2020-11-10 MED ORDER — ONDANSETRON HCL 4 MG/2ML IJ SOLN
INTRAMUSCULAR | Status: AC
  Filled 2020-11-10: qty 4

## 2020-11-10 MED ORDER — HEPARIN SODIUM (PORCINE) 5000 UNIT/ML IJ SOLN: 5000.0000 [IU] | Freq: Three times a day (TID) | INTRAMUSCULAR | Status: DC

## 2020-11-10 MED ORDER — POLYETHYLENE GLYCOL 3350 17 G PO PACK: 17.0000 g | PACK | Freq: Every day | ORAL | Status: DC | PRN

## 2020-11-10 MED ORDER — SODIUM CHLORIDE 0.9 % IV BOLUS
1500.0000 mL | Freq: Once | INTRAVENOUS | Status: AC
  Administered 2020-11-10: 500 mL via INTRAVENOUS

## 2020-11-10 MED ORDER — VANCOMYCIN HCL 1250 MG/250ML IV SOLN
1250.0000 mg | INTRAVENOUS | Status: DC
  Administered 2020-11-10: 1250 mg via INTRAVENOUS
  Filled 2020-11-10: qty 250

## 2020-11-10 MED ORDER — SODIUM CHLORIDE 0.9 % IV SOLN
INTRAVENOUS | Status: AC
  Filled 2020-11-10 (×2): qty 250

## 2020-11-10 MED ORDER — SODIUM CHLORIDE 0.9 % IV SOLN
10.0000 mg | Freq: Once | INTRAVENOUS | Status: AC
  Administered 2020-11-10: 10 mg via INTRAVENOUS
  Filled 2020-11-10: qty 10

## 2020-11-10 MED ORDER — PROCHLORPERAZINE MALEATE 10 MG PO TABS
10.0000 mg | ORAL_TABLET | Freq: Four times a day (QID) | ORAL | Status: DC | PRN
  Administered 2020-11-14: 10 mg via ORAL
  Filled 2020-11-10: qty 1

## 2020-11-10 MED ORDER — LACTATED RINGERS IV SOLN: INTRAVENOUS | Status: DC

## 2020-11-10 NOTE — Progress Notes (Signed)
Pharmacy Antibiotic Note  Hunter [name] is a 74 y.o. male admitted on 11/10/2020 with UTI refractory to ciprofloxacin.  Pharmacy has been consulted for vancomycin dosing.  Prescription for ciprofloxacin dispensed on 10/16/2020 for 7 day course by Dr Alen Blew.    Plan: Vancomycin 1250 mg IV every 48 hours.  Goal trough 10-15 mcg/mL.  Monitor clinical progress, renal function, vancomycin trough level if indicated F/U C&S, abx deescalation / LOT      Temp (24hrs), Avg:99.5 F (37.5 C), Min:99.5 F (37.5 C), Max:99.5 F (37.5 C)  Recent Labs  Lab 11/05/20 1355 11/10/20 1136  WBC 8.3 2.6*  CREATININE 2.25* 2.89*    Estimated Creatinine Clearance: 22.4 mL/min (A) (by C-G formula based on SCr of 2.89 mg/dL (H)).    Allergies  Allergen Reactions  . Amiodarone     fasciculations  . Oxycodone Other (See Comments)    Bad dreams    Antimicrobials this admission: 11/15 vancomycin >>   Dose adjustments this admission:   Microbiology results: 11/15 UCx: sent 11/11 and 10/20 UCx: Staphylococcus epidermidis, oxacillin resistant  Thank you for allowing pharmacy to be a part of this patient's care.  Efraim Kaufmann, PharmD, BCPS 11/10/2020 4:05 PM

## 2020-11-10 NOTE — Consult Note (Addendum)
Renal Service Consult Note [facility]  Hunter [name] 11/10/2020 Sol Blazing, MD Requesting Physician: Dr Neysa Bonito  Reason for Consult: Hyponatremia and AKI HPI: The patient is a 74 y.o. year-old w/ hx of bladder cancer, melanoma (2016), CAD sp CABG '13, HTN, HL, gout and atrial fibrillation admitted today for UTI not responding to po abx, hx bladder Ca getting active chemo w/ cisplatinum and gemcitabine. Pt has worsening fatigue since last chemo cycle, poor po intake. Has recurrent UTI. Creat 2.89 (baseline creat 1.3- 1.6). Drinking lots of fluids at home. Na 118 here. Getting IV abx . Asked to see for hyponatremia.   Initial BP's in hospital are soft 99/55 - 105/ 50.  Pt notes poor po intake but has been trying to keep down electrolyte type liquids.  Did good after 1st chemo , but had UTI after 2nd round and did good for a few days after the last round but then it "hit me like a ton of bricks". Hasn't been able to eat. Very weak, unable to get up.  Voiding a lot, every 2 hrs.     ROS  denies CP  no joint pain   no HA  no blurry vision  no rash  no diarrhea  no nausea/ vomiting  +urinary frequency, no dysuria   Past Medical History  Past Medical History:  Diagnosis Date  . 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 Intol amio, was on Sotalol, now on Lopressor  . Bladder cancer ([place]) dx'd 10/2019  . Bursitis of right shoulder   . CAD (coronary artery disease) 11/2012   CABG x 4 using left internal mammary artery and right endovein harvest. (LIMA-LAD; VG-OM1, OM2, VG-PD), EF 55-60% at cath  . Degenerative disc disease, lumbar   . Epigastric pain    Myoview 11/09/12 Showed evidence of reversible ischemia in both the inferior wall & the anteroapical distribution.   . Erectile dysfunction    H/O ED while taking a beta-blocker  . GERD (gastroesophageal reflux disease)   . Gout   . Hiatal hernia   . Hyperlipidemia   . Hypertension     Systemic HTN. Pt has H/O of ED when taking a beta-blocker. ECHO 11/26/08 Showed no evidence of any significant valvular disease, Estimated EF = 50-60%.  . Kidney stone    " i have  6-7 stones imbedded in my left kidney "   . Mononeuritis of unspecified site   . Nocturia   . NSVT (nonsustained ventricular tachycardia) ([place])   . Obesity   . 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 PAF (paroxysmal atrial fibrillation) ([facility])    Post CABG. Amia intol. Was on Sotalol, now on Lopressor  . Polio    Age 58. 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 Seasonal allergies   . significant CAD requiring CABG 12/15/2012  . Skin cancer (melanoma) ([place] 08/04/2015   left side of face   . Skin lesion of face    hx of   Past Surgical History  Past Surgical History:  Procedure Laterality Date  . CARDIAC CATHETERIZATION     12/08/2012  . COLONOSCOPY    . CORONARY ARTERY BYPASS GRAFT  12/12/2012   CABG x 4 using left internal mammary artery and right endovein harvest. (LIMA-LAD; VG-OM1, OM2, VG-PD)  . EYE SURGERY     had lasik surgery  bilaterally 1996  . IR IMAGING GUIDED PORT INSERTION  09/10/2020  . LEFT HEART CATHETERIZATION WITH CORONARY ANGIOGRAM N/A 12/08/2012   Procedure: LEFT HEART CATHETERIZATION WITH CORONARY ANGIOGRAM;  Surgeon: Sanda Klein, MD;  Location: [facility];  Service: Cardiovascular;  Laterality: N/A;  . SHOULDER SURGERY     for effects of polio  . SKIN CANCER EXCISION  2016   left side of face   . TRANSURETHRAL RESECTION OF BLADDER TUMOR N/A 11/16/2019   Procedure: TRANSURETHRAL RESECTION OF BLADDER TUMOR (TURBT) WITH INSTILLATION OF POST OPERATIVE CHEMOTHERAPY;  Surgeon: Kathie Rhodes, MD;  Location: [facility];  Service: Urology;  Laterality: N/A;  . TRANSURETHRAL RESECTION OF BLADDER TUMOR WITH MITOMYCIN-C N/A 08/12/2020   Procedure: TRANSURETHRAL RESECTION OF BLADDER  TUMOR WITH GEMCITABINE;  Surgeon: Robley Fries, MD;  Location: [facility];  Service: Urology;  Laterality: N/A;  1 HR  . UPPER GI ENDOSCOPY     Family History  Family History  Problem Relation Age of Onset  . CAD Father   . Hypertension Father   . AAA (abdominal aortic aneurysm) Father   . Aneurysm Father        Cause of death  . Heart attack Paternal Grandfather   . Cancer Mother        Colon. Cause of death   Social History  reports that he has never smoked. He has never used smokeless tobacco. He reports current alcohol use. He reports that he does not use drugs. Allergies  Allergies  Allergen Reactions  . Amiodarone     fasciculations  . Oxycodone Other (See Comments)    Bad dreams   Home medications Prior to Admission medications   Medication Sig Start Date End Date Taking? Authorizing Provider  acetaminophen (TYLENOL) 325 MG tablet Take 650 mg by mouth every 6 (six) hours as needed for mild pain, fever or headache.   Yes [provider]  allopurinol (ZYLOPRIM) 300 MG tablet Take 300 mg by mouth daily.   Yes [provider]  carvedilol (COREG) 6.25 MG tablet Take 1 tablet (6.25 mg total) by mouth 2 (two) times daily with a meal. 08/07/20  Yes Barrett, Evelene Croon, PA-C  lidocaine-prilocaine (EMLA) cream Apply 1 application topically as needed. Patient taking differently: Apply 1 application topically as needed (port access).  09/03/20  Yes Wyatt Portela, MD  omeprazole (PRILOSEC) 20 MG capsule Take 20 mg by mouth every Monday, Wednesday, and Friday.    Yes [provider]  prochlorperazine (COMPAZINE) 10 MG tablet Take 1 tablet (10 mg total) by mouth every 6 (six) hours as needed for nausea or vomiting. 09/03/20  Yes Wyatt Portela, MD  simvastatin (ZOCOR) 40 MG tablet Take 1 tablet (40 mg total) by mouth daily at 6 PM. 08/07/20  Yes Barrett, Evelene Croon, PA-C  triamterene-hydrochlorothiazide (MAXZIDE-25) 37.5-25 MG tablet TAKE 1 TABLET BY MOUTH 4 TIMES A  WEEK Patient taking differently: Take 1 tablet by mouth See admin instructions. 1 tablet daily on Saturday Sunday Tuesday Thursday 08/07/20  Yes Barrett, Evelene Croon, PA-C  mirabegron ER (MYRBETRIQ) 25 MG TB24 tablet Take 1 tablet (25 mg total) by mouth daily. Patient not taking: Reported on 11/10/2020 10/14/20   Wyatt Portela, MD  sildenafil (REVATIO) 20 MG tablet Take 1-3 tablets (20-60 mg total) by mouth daily as needed. Patient not taking: Reported on 11/10/2020 08/07/20   Barrett, Evelene Croon, PA-C  tamsulosin (FLOMAX) 0.4 MG CAPS capsule Take 1 capsule (0.4 mg total) by mouth  daily. Patient not taking: Reported on 11/10/2020 08/12/20   Jacalyn Lefevre D, MD     Vitals:   11/10/20 1609  BP: 101/60  Pulse: 61  Resp: 19  Temp: (!) 97.5 F (36.4 C)  TempSrc: Oral  SpO2: 100%   Exam Gen alert, R upper chest port, pleasant No rash, cyanosis or gangrene Sclera anicteric, throat clear and moist  No jvd or bruits Chest clear bilat to bases RRR no MRG Abd soft ntnd no mass or ascites +bs GU normal male MS no joint effusions or deformity Ext no leg or UE edema, no wounds or ulcers Neuro is alert, Ox 3 , nf    Home meds:  - coreg 6.25 bid/ zocor 40 qd/ maxzide 37.5-25 mg take 1 qd 4d per wk  - mirabegron ER qd/ revatio prn/ compazine prn/ prilosec 20 qd  - zyloprim 33 qd    BP 99/55 HR 61  RR 19  Temp 97.5  99% room air   UA 11/15 - cloudy, 100 prot, many bact, 11-20 rbc, >50 wbc w/ clumps   Na 118  K 3.2  CO2 21  BUN 62  Cr 2.89  Ca 8.8   Alb 2.4  AST 66/ ALT 46 Tbili 0.7   eGFR 22 wBC 2.6  Hb 9.6  plt 84k       Baseline creat 1.3- 1.6 from 2020 - Oct 2021, eGFR 46- 51     Last creat on 11/3 1.44, on 11/10 2.25, today 2.89      Assessment/ Plan: 1. 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 BP's soft due to vol depletion, less likely sepsis. Agree w/ IVF"s. Will bolus 1.5 L as well. With hx CKD 3 he is at greater risk for cisplatin nephrotoxicity as well.   Get urine lytes, renal US. Will follow.  2. Hyponatremia - not sure hypovolemic or euvolemic yet, exam not definitive. Will give isotonic saline overnight and observe response. Get UNa, Uosm, TSH and am cortisol.  Holding home diuretics. Get q 6 hr Na levels for now.  3. UTI - getting IV vanc/ ceph, would dc the IV vanc as soon as possible w/ acute and chronic renal issues  4. HTN - hold coreg and diuretics, bp's soft, cont IVF's 5. Bladder Ca - per ONC, primary team 6. CAD hx CABG 7. Atrial fib - per primary      Kelly Splinter  MD 11/10/2020, 4:36 PM  Recent Labs  Lab 11/05/20 1355 11/10/20 1136  WBC 8.3 2.6*  HGB 12.1* 9.6*   Recent Labs  Lab 11/05/20 1355 11/10/20 1136  K 3.8 3.2*  BUN 38* 62*  CREATININE 2.25* 2.89*  CALCIUM 9.1 8.8*

## 2020-11-10 NOTE — Telephone Encounter (Signed)
CRITICAL VALUE STICKER  CRITICAL VALUE: Sodium = 118  RECEIVER (on-site recipient of call): Yetta Glassman, [place] NOTIFIED: 11/10/20 at 12:24pm  MESSENGER (representative from lab): Ulice Dash  MD NOTIFIED: Dr. Alen Blew  TIME OF NOTIFICATION: 12:26pm  RESPONSE: Pt is seeing Sandi Mealy, PA-C right now and he will assess pt.

## 2020-11-10 NOTE — Patient Instructions (Signed)

## 2020-11-10 NOTE — H&P (Signed)
History and Physical        Hospital Admission Note Date: 11/10/2020  Patient name: Hunter [name] Medical record number: [id number] Date of birth: 1946/11/17 Age: 74 y.o. Gender: male  PCP: [name] E  Patient coming from: Home Lives with: Wife At baseline, ambulates: Independently  Chief Complaint    No chief complaint on file.     HPI:   This is a 74 year old male with past medical history of bladder cancer on gemcitabine and cisplatin, melanoma, CAD s/p CABG in 2013, chronic hyponatremia, hypertension, hyperlipidemia, gout, atrial fibrillation not on anticoagulation, recurrent staph epi UTIs who was a direct admission from the cancer center today for abnormal lab work.  Patient stated that he has been feeling fatigue since his most recent chemo cycle with poor p.o. intake.  Went for a follow-up visit today with his oncologist and on lab work the patient had acute on chronic hyponatremia (118) as well as AKI with creatinine 2.89 (baseline 1.3-1.6).  He was also noted to have a positive UA despite recently being treated for a UTI with Cipro and he was started on vancomycin in the cancer center.  Currently denies any chest pain, palpitations, nausea, vomiting, diarrhea or any other complaints other than those stated.    Initially he was afebrile, hemodynamically stable, on room air. Notable Labs: Sodium 118, K3.2, chloride 84, glucose 184, BUN 62, creatinine 2.89, AST 66, ALT 4, UA positive for UTI.  At bedside, telemetry was abnormal and an EKG was performed EKG shows atrial fibrillation with RVR, BP at bedside hypotensive but he is asymptomatic.   Patient was started on IV NS.    Vitals:   11/10/20 1609 11/10/20 1727  BP: 101/60 97/74  Pulse: 61 79  Resp: 19   Temp: (!) 97.5 F (36.4 C)   SpO2: 100% 99%     Review of Systems:  Review of Systems  Genitourinary:  Negative.   All other systems reviewed and are negative.   Medical/Social/Family History   Past Medical History: Past Medical History:  Diagnosis Date  . Atrial fibrillation, rapid ([place])    With high ventricular rates. Intol amio, was on Sotalol, now on Lopressor  . Bladder cancer ([place]) dx'd 10/2019  . Bursitis of right shoulder   . CAD (coronary artery disease) 11/2012   CABG x 4 using left internal mammary artery and right endovein harvest. (LIMA-LAD; VG-OM1, OM2, VG-PD), EF 55-60% at cath  . Degenerative disc disease, lumbar   . Epigastric pain    Myoview 11/09/12 Showed evidence of reversible ischemia in both the inferior wall & the anteroapical distribution.   . Erectile dysfunction    H/O ED while taking a beta-blocker  . GERD (gastroesophageal reflux disease)   . Gout   . Hiatal hernia   . Hyperlipidemia   . Hypertension    Systemic HTN. Pt has H/O of ED when taking a beta-blocker. ECHO 11/26/08 Showed no evidence of any significant valvular disease, Estimated EF = 50-60%.  . Kidney stone    " i have  6-7 stones imbedded in my left kidney "   . Mononeuritis of unspecified site   . Nocturia   . NSVT (nonsustained ventricular tachycardia) ([place])   .  Obesity   . 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 PAF (paroxysmal atrial fibrillation) ([facility])    Post CABG. Amia intol. Was on Sotalol, now on Lopressor  . 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 Seasonal allergies   . significant CAD requiring CABG 12/15/2012  . Skin cancer (melanoma) ([place]) 08/04/2015   left side of face   . Skin lesion of face    hx of    Past Surgical History:  Procedure Laterality Date  . CARDIAC CATHETERIZATION     12/08/2012  . COLONOSCOPY    . CORONARY ARTERY BYPASS GRAFT  12/12/2012   CABG x 4 using left internal mammary artery and right endovein harvest.  (LIMA-LAD; VG-OM1, OM2, VG-PD)  . EYE SURGERY     had lasik surgery bilaterally 1996  . IR IMAGING GUIDED PORT INSERTION  09/10/2020  . LEFT HEART CATHETERIZATION WITH CORONARY ANGIOGRAM N/A 12/08/2012   Procedure: LEFT HEART CATHETERIZATION WITH CORONARY ANGIOGRAM;  Surgeon: Sanda Klein, MD;  Location: [facility];  Service: Cardiovascular;  Laterality: N/A;  . SHOULDER SURGERY     for effects of polio  . SKIN CANCER EXCISION  2016   left side of face   . TRANSURETHRAL RESECTION OF BLADDER TUMOR N/A 11/16/2019   Procedure: TRANSURETHRAL RESECTION OF BLADDER TUMOR (TURBT) WITH INSTILLATION OF POST OPERATIVE CHEMOTHERAPY;  Surgeon: Kathie Rhodes, MD;  Location: [facility];  Service: Urology;  Laterality: N/A;  . TRANSURETHRAL RESECTION OF BLADDER TUMOR WITH MITOMYCIN-C N/A 08/12/2020   Procedure: TRANSURETHRAL RESECTION OF BLADDER TUMOR WITH GEMCITABINE;  Surgeon: Robley Fries, MD;  Location: [facility];  Service: Urology;  Laterality: N/A;  1 HR  . UPPER GI ENDOSCOPY      Medications: Prior to Admission medications   Medication Sig Start Date End Date Taking? Authorizing Provider  acetaminophen (TYLENOL) 325 MG tablet Take 650 mg by mouth every 6 (six) hours as needed for mild pain, fever or headache.   Yes [provider]  allopurinol (ZYLOPRIM) 300 MG tablet Take 300 mg by mouth daily.   Yes [provider]  carvedilol (COREG) 6.25 MG tablet Take 1 tablet (6.25 mg total) by mouth 2 (two) times daily with a meal. 08/07/20  Yes Barrett, Evelene Croon, PA-C  lidocaine-prilocaine (EMLA) cream Apply 1 application topically as needed. Patient taking differently: Apply 1 application topically as needed (port access).  09/03/20  Yes Wyatt Portela, MD  omeprazole (PRILOSEC) 20 MG capsule Take 20 mg by mouth every Monday, Wednesday, and Friday.    Yes [provider]  prochlorperazine (COMPAZINE) 10 MG tablet Take 1 tablet (10 mg total) by mouth every 6 (six) hours as needed for  nausea or vomiting. 09/03/20  Yes Wyatt Portela, MD  simvastatin (ZOCOR) 40 MG tablet Take 1 tablet (40 mg total) by mouth daily at 6 PM. 08/07/20  Yes Barrett, Evelene Croon, PA-C  triamterene-hydrochlorothiazide (MAXZIDE-25) 37.5-25 MG tablet TAKE 1 TABLET BY MOUTH 4 TIMES A WEEK Patient taking differently: Take 1 tablet by mouth See admin instructions. 1 tablet daily on Saturday Sunday Tuesday Thursday 08/07/20  Yes Barrett, Evelene Croon, PA-C  mirabegron ER (MYRBETRIQ) 25 MG TB24 tablet Take 1 tablet (25 mg total) by mouth daily. Patient not taking: Reported on 11/10/2020 10/14/20   Wyatt Portela, MD  sildenafil (REVATIO) 20 MG tablet  Take 1-3 tablets (20-60 mg total) by mouth daily as needed. Patient not taking: Reported on 11/10/2020 08/07/20   Barrett, Evelene Croon, PA-C  tamsulosin (FLOMAX) 0.4 MG CAPS capsule Take 1 capsule (0.4 mg total) by mouth daily. Patient not taking: Reported on 11/10/2020 08/12/20   Robley Fries, MD    Allergies:   Allergies  Allergen Reactions  . Amiodarone     fasciculations  . Oxycodone Other (See Comments)    Bad dreams    Social History:  reports that he has never smoked. He has never used smokeless tobacco. He reports current alcohol use. He reports that he does not use drugs.  Family History: Family History  Problem Relation Age of Onset  . CAD Father   . Hypertension Father   . AAA (abdominal aortic aneurysm) Father   . Aneurysm Father        Cause of death  . Heart attack Paternal Grandfather   . Cancer Mother        Colon. Cause of death     Objective   Physical Exam: Blood pressure 97/74, pulse 79, temperature (!) 97.5 F (36.4 C), temperature source Oral, resp. rate 19, height 5\' 9"  (1.753 m), weight 80.7 kg, SpO2 99 %.  Physical Exam Vitals and nursing note reviewed.  Constitutional:      Appearance: Normal appearance.     Comments: Port-A-Cath in place  HENT:     Head: Normocephalic and atraumatic.  Eyes:      Conjunctiva/sclera: Conjunctivae normal.  Cardiovascular:     Rate and Rhythm: Normal rate and regular rhythm.  Pulmonary:     Effort: Pulmonary effort is normal.     Breath sounds: Normal breath sounds.  Abdominal:     General: Abdomen is flat.     Palpations: Abdomen is soft.  Musculoskeletal:        General: No swelling or tenderness.  Skin:    Coloration: Skin is not jaundiced or pale.  Neurological:     Mental Status: He is alert. Mental status is at baseline.  Psychiatric:        Mood and Affect: Mood normal.        Behavior: Behavior normal.     LABS on Admission: I have personally reviewed all the labs and imaging below    Basic Metabolic Panel: Recent Labs  Lab 11/10/20 1136 11/10/20 1630  NA 118* 121*  K 3.2* 4.0  CL 84* 87*  CO2 21* 22  GLUCOSE 184* 145*  BUN 62* 63*  CREATININE 2.89* 2.86*  CALCIUM 8.8* 8.3*   Liver Function Tests: Recent Labs  Lab 11/05/20 1355 11/10/20 1136  AST 20 66*  ALT 13 46*  ALKPHOS 83 92  BILITOT 0.8 0.7  PROT 7.2 6.5  ALBUMIN 3.2* 2.4*   No results for input(s): LIPASE, AMYLASE in the last 168 hours. No results for input(s): AMMONIA in the last 168 hours. CBC: Recent Labs  Lab 11/05/20 1355 11/05/20 1355 11/10/20 1136  WBC 8.3  --  2.6*  NEUTROABS 7.2   < > 2.2  HGB 12.1*  --  9.6*  HCT 35.0*  --  26.6*  MCV 90.4   < > 86.9  PLT 226  --  84*   < > = values in this interval not displayed.   Cardiac Enzymes: No results for input(s): CKTOTAL, CKMB, CKMBINDEX, TROPONINI in the last 168 hours. BNP: Invalid input(s): POCBNP CBG: No results for input(s): GLUCAP in the last 168 hours.  Radiological Exams on Admission:  No results found.    EKG: Atrial fibrillation with RVR, HR 112 on EKG briefly up to 140 at bedside telemetry then down to 90s   A & P   Principal Problem:   Hyponatremia Active Problems:   Essential hypertension   CAD s/p CABG   Malignant neoplasm of urinary bladder ([facility])   Acute  lower UTI   AKI (acute kidney injury) ([place])   1. Acute on chronic hyponatremia a. Sodium 118 this a.m., currently 121 and baseline is low 130s b. Other than some fatigue he is asymptomatic c. Nephrology consulted d. Continue IV fluids and bolus ordered by Dr. Jonnie Finner  2. AKI on CKD 3 a a. Nephrology consulted, appreciate assistance b. at greater risk for cisplatin toxicity due to CKD c. IV fluids as above d. Renal ultrasound and urine electrolytes electrolytes per nephro  3. UTI with history of recurrent Staph epidermidis UTIs a. Most recent culture from 11/11 resistant to penicillin and Bactrim b. will hold vancomycin due to his AKI and change antibiotics to doxycycline for 5 to 7 days after discussion with pharmacy  4. Asymptomatic atrial fibrillation with RVR with hypotension a. CHA2DS2-VASc: 3 (age, hypertension, vascular disease) - candidate for anticoagulation, will start on heparin given renal function b. No need for cardioversion at this time, as he is not unstable and hypotension should resolve with IV fluids. I suspect his A. fib will improve after he has IV fluids c. Previously on Xarelto d. hold beta-blocker for now due to hypotension and restart beta-blocker once his BP has normalized e. Check HbA1c f. Echo g. Check magnesium   5. Hypokalemia, resolved  6. Hypertension, currently hypotensive a. Hold antihypertensives b. Follow-up after IV fluids  7. Bladder cancer a. Follows with oncology b. Recently had cisplatin and gemcitabine  8. CAD s/p CABG in 2013 a. On aspirin and statin but will likely need to start on anticoagulation due to A. fib   DVT prophylaxis: heparin   Code Status: DNR  Diet: Heart healthy Family Communication: Admission, patients condition and plan of care including tests being ordered have been discussed with the patient who indicates understanding and agrees with the plan and Code Status. Patient's wife was updated  Disposition Plan: The  appropriate patient status for this patient is INPATIENT. Inpatient status is judged to be reasonable and necessary in order to provide the required intensity of service to ensure the patient's safety. The patient's presenting symptoms, physical exam findings, and initial radiographic and laboratory data in the context of their chronic comorbidities is felt to place them at high risk for further clinical deterioration. Furthermore, it is not anticipated that the patient will be medically stable for discharge from the hospital within 2 midnights of admission. The following factors support the patient status of inpatient.   " The patient's presenting symptoms include fatigue, abnormal lab work. " The worrisome physical exam findings include A. fib with RVR. " The initial radiographic and laboratory data are worrisome because of hyponatremia, AKI. " The chronic co-morbidities include CAD s/p CABG, bladder cancer, CKD 3.   * I certify that at the point of admission it is my clinical judgment that the patient will require inpatient hospital care spanning beyond 2 midnights from the point of admission due to high intensity of service, high risk for further deterioration and high frequency of surveillance required.*   Status is: Inpatient  Remains inpatient appropriate because:Persistent severe electrolyte disturbances, Ongoing diagnostic testing needed not  appropriate for outpatient work up and IV treatments appropriate due to intensity of illness or inability to take PO   Dispo: The patient is from: Home              Anticipated d/c is to: Home              Anticipated d/c date is: 3 days              Patient currently is not medically stable to d/c.         The medical decision making on this patient was of high complexity and the patient is at high risk for clinical deterioration, therefore this is a level 3 admission.  Consultants  . Nephrology  Procedures  . None  Time Spent on  Admission: 72 minutes    Harold Hedge, DO [facility]  11/10/2020, 6:40 PM

## 2020-11-10 NOTE — Progress Notes (Signed)
[facility] Progress Note   Hunter [name] [id number] 02-Sep-1946 74 y.o.  Hunter [name] is managed by Dr. Zola Button  Actively treated with chemotherapy/immunotherapy/hormonal therapy: yes  Current therapy: cisplatin and gemcitabine   Last treated: 11/06/2020 (Cycle #3, Day # 8)  Next scheduled appointment with provider: 11/19/2020  Assessment: Plan:    Urinary tract infection without hematuria, site unspecified - Plan: Urinalysis, Complete w Microscopic, Urine Culture, vancomycin per pharmacy consult, vancomycin per pharmacy consult  Hypotension, unspecified hypotension type - Plan: 0.9 %  sodium chloride infusion  Nausea without vomiting - Plan: 0.9 %  sodium chloride infusion, ondansetron (ZOFRAN) injection 8 mg, dexamethasone (DECADRON) 10 mg in sodium chloride 0.9 % 50 mL IVPB, ondansetron (ZOFRAN) 4 MG/2ML injection  Malignant neoplasm of urinary bladder, unspecified site ([place])  Acute kidney injury ([place]) - Plan: vancomycin per pharmacy consult, vancomycin per pharmacy consult  Hyponatremia  Hypokalemia   Urinary tract infection: Hunter [name] began Cipro on 10/16/2020 for Staph epidermidis from a urine culture on 10/15/2020. He again had a urine culture positive for Staph epidermidis on 11/06/2020 but has not yet been retreated. A consult was placed to pharmacy to assist with Vancomycin dose.  Hypotension: Hunter [name] was given 1 liter of normal saline over 2 hours.  Nausea: Hunter [name] was given Zofran 8 mg IV and Decadron 10 mg IV x 1 today.  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 He is scheduled to see Dr. Alen Blew in follow up on 11/19/2020.  Acute kidney injury: A chemistry panel returned with a creatinine of 2.89 today. This was up from 2.25 when last checked 5 days ago. Dr. Neysa Bonito graciously agreed to accept [name] as a direct admission to [facility].  Hyponatremia: A chemistry panel returned with a sodium of 118 today. This was down from 129 when last checked 5 days ago.  Dr. Neysa Bonito graciously agreed to accept Hunter [name] as a direct admission to [facility].  Hypokalemia: A chemistry panel returned with a potassium of 3.2 today. No intervention was undertaken while in the [facility] today.  Please see After Visit Summary for patient specific instructions.  Future Appointments  Date Time Provider [place]  11/19/2020  8:15 AM [facility] None  11/19/2020  8:30 AM [facility] [place] FLUSH [facility] None  11/19/2020  9:00 AM Wyatt Portela, MD [facility] None  11/19/2020 10:00 AM [facility] INFUSION [facility] None  11/26/2020  8:00 AM [facility] None  11/26/2020  8:15 AM [facility] [place] FLUSH [facility] None  11/26/2020  9:15 AM [facility] INFUSION [facility] None    Orders Placed This Encounter  Procedures  . Urine Culture  . Urinalysis, Complete w Microscopic  . vancomycin per pharmacy consult       Subjective:   Patient ID:  Hunter [name] is a 74 y.o. (DOB 29-Sep-1946) male.  Chief Complaint:  Chief Complaint  Patient presents with  . Urinary Tract Infection  . Fatigue    HPI Hunter [name]  is a 74 y.o. 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 He is managed by Dr. Alen Blew and is status post Cycle #3, Day # 8 of cisplatin and gemcitabine which was dosed on 11/06/2020. He presents today with his wife. He reports fatigue, dizziness, polyuria with dark orange urine, back pain, nausea and increased anorexia since his  last chemotherapy. He reports having an fever of 101 last Wednesday but none since. He was diagnosed with a Staph epidermidis UTI from a urine culture on 10/15/2020. He began Cipro on 10/16/2020. He again had a urine culture positive for Staph epidermidis on 11/06/2020  but has not yet been retreated. His labs from today showed acute kidney injury with a chemistry panel returning with a creatinine of 2.89. This was up from 2.25 when last checked 5 days ago. He was also noted to have worsening hyponatremia with a sodium of 118. This was down from 129 when last checked 5 days ago.  A chemistry panel also returned with a potassium of 3.2 today. His vital signs on arrival showed a BP of 97/55, pulse of 126, respirations of 18, temp of 99.5 and an oxygen saturation on room air of 97%. 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     Prior Therapy:  He is status post TURBT completed in August 2021 which confirmed the presence of muscle invasion.  Current therapy:  Neoadjuvant chemotherapy utilizing cisplatin and gemcitabine started on September 17, 2020. Status post Cycle #3, Day # 8 of cisplatin and gemcitabine which was dosed on 11/06/2020.   Medications: I have reviewed the patient's current medications.  Allergies:  Allergies  Allergen Reactions  . Amiodarone     fasciculations  . Oxycodone Other (See Comments)    Bad dreams    Past Medical History:  Diagnosis Date  . 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 Intol amio, was on Sotalol, now on Lopressor  . Bladder cancer ([place]) dx'd 10/2019  . Bursitis of right shoulder   . CAD (coronary artery disease) 11/2012   CABG x 4 using left internal mammary artery and right endovein harvest. (LIMA-LAD; VG-OM1, OM2, VG-PD), EF 55-60% at cath  . Degenerative disc disease, lumbar   . Epigastric pain    Myoview 11/09/12 Showed evidence of reversible ischemia in both the inferior wall & the anteroapical distribution.   . Erectile dysfunction    H/O ED while taking a beta-blocker  . GERD (gastroesophageal reflux disease)   . Gout   . Hiatal hernia   .  Hyperlipidemia   . Hypertension    Systemic HTN. Pt has H/O of ED when taking a beta-blocker. ECHO 11/26/08 Showed no evidence of any significant valvular disease, Estimated EF = 50-60%.  . Kidney stone    " i have  6-7 stones imbedded in my left kidney "   . Mononeuritis of unspecified site   . Nocturia   . NSVT (nonsustained ventricular tachycardia) ([place])   . Obesity   . 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 PAF (paroxysmal atrial fibrillation) ([facility])    Post CABG. Amia intol. Was on Sotalol, now on Lopressor  . 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 Seasonal allergies   . significant CAD requiring CABG 12/15/2012  . Skin cancer (melanoma) ([place]) 08/04/2015   left side of face   . Skin lesion of face    hx of    Past Surgical History:  Procedure Laterality Date  . CARDIAC CATHETERIZATION     12/08/2012  . COLONOSCOPY    . CORONARY ARTERY BYPASS GRAFT  12/12/2012   CABG x 4 using left internal mammary artery and right endovein harvest. (LIMA-LAD; VG-OM1, OM2, VG-PD)  . EYE SURGERY     had lasik surgery bilaterally 1996  . IR IMAGING GUIDED PORT INSERTION  09/10/2020  . LEFT HEART CATHETERIZATION WITH CORONARY ANGIOGRAM N/A 12/08/2012   Procedure: LEFT HEART CATHETERIZATION WITH CORONARY ANGIOGRAM;  Surgeon: Sanda Klein, MD;  Location: [facility];  Service: Cardiovascular;  Laterality: N/A;  . SHOULDER SURGERY     for effects of polio  . SKIN CANCER EXCISION  2016   left side of face   . TRANSURETHRAL RESECTION OF BLADDER TUMOR N/A 11/16/2019   Procedure: TRANSURETHRAL RESECTION OF BLADDER TUMOR (TURBT) WITH INSTILLATION OF POST OPERATIVE CHEMOTHERAPY;  Surgeon: Kathie Rhodes, MD;  Location: [facility];  Service: Urology;  Laterality: N/A;  . TRANSURETHRAL RESECTION OF BLADDER TUMOR WITH MITOMYCIN-C N/A 08/12/2020   Procedure: TRANSURETHRAL RESECTION  OF BLADDER TUMOR WITH GEMCITABINE;  Surgeon: Robley Fries, MD;  Location: [facility];  Service: Urology;  Laterality: N/A;  1 HR  . UPPER GI ENDOSCOPY      Family History  Problem Relation Age of Onset  . CAD Father   . Hypertension Father   . AAA (abdominal aortic aneurysm) Father   . Aneurysm Father        Cause of death  . Heart attack Paternal Grandfather   . Cancer Mother        Colon. Cause of death    Social History   Socioeconomic History  . Marital status: Married    Spouse name: Not on file  . Number of children: 3  . Years of education: Not on file  . Highest education level: Not on file  Occupational History  . Occupation: Higher education careers adviser (RETIRED)    Employer: [employer]  Tobacco Use  . Smoking status: Never Smoker  . Smokeless tobacco: Never Used  Substance and Sexual Activity  . Alcohol use: Yes    Comment: social, very little amount, tequila  . Drug use: No  . Sexual activity: Not on file  Other Topics Concern  . Not on file  Social History Narrative  . Not on file   Social Determinants of Health   Financial Resource Strain:   . Difficulty of Paying Living Expenses: Not on file  Food Insecurity:   . Worried About Charity fundraiser in the Last Year: Not on file  . Ran Out of Food in the Last Year: Not on file  Transportation Needs:   . Lack of Transportation (Medical): Not on file  . Lack of Transportation (Non-Medical): Not on file  Physical Activity:   . Days of Exercise per Week: Not on file  . Minutes of Exercise per Session: Not on file  Stress:   . Feeling of Stress : Not on file  Social Connections:   . Frequency of Communication with Friends and Family: Not on file  . Frequency of Social Gatherings with Friends and Family: Not on file  . Attends Religious Services: Not on file  . Active Member of Clubs or Organizations: Not on file  . Attends Archivist Meetings: Not on file  . Marital Status: Not on file  Intimate  Partner Violence:   . Fear of Current or Ex-Partner: Not on file  . Emotionally Abused: Not on file  . Physically Abused: Not on file  . Sexually Abused: Not on file    Past Medical History, Surgical history, Social history,  and Family history were reviewed and updated as appropriate.   Please see review of systems for further details on the patient's review from today.   Review of Systems:  Review of Systems  Constitutional: Positive for appetite change and fatigue. Negative for chills, diaphoresis and fever.  HENT: Negative for trouble swallowing.   Respiratory: Negative for cough and shortness of breath.   Cardiovascular: Negative for chest pain, palpitations and leg swelling.  Gastrointestinal: Negative for abdominal pain, constipation, diarrhea, nausea and vomiting.  Genitourinary: Positive for frequency. Negative for difficulty urinating, dysuria, flank pain, hematuria and urgency.  Musculoskeletal: Positive for back pain.  Skin: Negative for rash.  Neurological: Negative for dizziness and headaches.    Objective:   Physical Exam:  BP (!) 99/55 (BP Location: Right Arm, Patient Position: Sitting)   Pulse (!) 126   Temp 99.5 F (37.5 C) (Tympanic)   Resp 18   Ht 5\' 9"  (1.753 m)   Wt 177 lb (80.3 kg)   SpO2 99%   BMI 26.14 kg/m  ECOG: 1  Physical Exam Constitutional:      General: He is not in acute distress.    Appearance: He is not diaphoretic.  HENT:     Head: Normocephalic and atraumatic.  Eyes:     General: No scleral icterus.       Right eye: No discharge.        Left eye: No discharge.     Conjunctiva/sclera: Conjunctivae normal.  Cardiovascular:     Rate and Rhythm: Normal rate and regular rhythm.     Heart sounds: Normal heart sounds. No murmur heard.  No friction rub. No gallop.   Pulmonary:     Effort: Pulmonary effort is normal. No respiratory distress.     Breath sounds: Normal breath sounds. No wheezing or rales.  Abdominal:     General:  Bowel sounds are normal. There is no distension.     Tenderness: There is no abdominal tenderness. There is no guarding.  Skin:    General: Skin is warm and dry.     Findings: No erythema or rash.  Neurological:     Mental Status: He is alert.     Coordination: Coordination normal.     Gait: Gait normal.  Psychiatric:        Mood and Affect: Mood normal.        Behavior: Behavior normal.        Thought Content: Thought content normal.        Judgment: Judgment normal.     Lab Review:     Component Value Date/Time   NA 121 (L) 11/10/2020 1630   NA 142 08/24/2019 1415   K 4.0 11/10/2020 1630   CL 87 (L) 11/10/2020 1630   CO2 22 11/10/2020 1630   GLUCOSE 145 (H) 11/10/2020 1630   BUN 63 (H) 11/10/2020 1630   BUN 36 (H) 08/24/2019 1415   CREATININE 2.86 (H) 11/10/2020 1630   CREATININE 2.89 (H) 11/10/2020 1136   CREATININE 1.25 09/26/2014 0921   CALCIUM 8.3 (L) 11/10/2020 1630   PROT 6.5 11/10/2020 1136   ALBUMIN 2.4 (L) 11/10/2020 1136   AST 66 (H) 11/10/2020 1136   ALT 46 (H) 11/10/2020 1136   ALKPHOS 92 11/10/2020 1136   BILITOT 0.7 11/10/2020 1136   GFRNONAA 22 (L) 11/10/2020 1630   GFRNONAA 22 (L) 11/10/2020 1136   GFRAA >60 09/23/2020 1149       Component Value Date/Time   WBC 2.6 (L)  11/10/2020 1136   WBC 7.8 09/10/2020 1320   RBC 3.06 (L) 11/10/2020 1136   HGB 9.6 (L) 11/10/2020 1136   HCT 26.6 (L) 11/10/2020 1136   PLT 84 (L) 11/10/2020 1136   MCV 86.9 11/10/2020 1136   MCH 31.4 11/10/2020 1136   MCHC 36.1 (H) 11/10/2020 1136   RDW 15.2 11/10/2020 1136   LYMPHSABS 0.2 (L) 11/10/2020 1136   MONOABS 0.1 11/10/2020 1136   EOSABS 0.0 11/10/2020 1136   BASOSABS 0.0 11/10/2020 1136   -------------------------------  Imaging from last 24 hours (if applicable):  Radiology interpretation: No results found.

## 2020-11-10 NOTE — Progress Notes (Signed)
ANTICOAGULATION CONSULT NOTE - Initial Consult  Pharmacy Consult for heparin Indication: atrial fibrillation  Allergies  Allergen Reactions  . Amiodarone     fasciculations  . Oxycodone Other (See Comments)    Bad dreams    Patient Measurements: Height: 5\' 9"  (175.3 cm) Weight: 80.7 kg (178 lb) IBW/kg (Calculated) : 70.7 Heparin Dosing Weight: 80.7 kg  Vital Signs: Temp: 97.4 F (36.3 C) (11/15 2107) Temp Source: Oral (11/15 2107) BP: 103/66 (11/15 2107) Pulse Rate: 83 (11/15 2107)  Labs: Recent Labs    11/10/20 1136 11/10/20 1630  HGB 9.6*  --   HCT 26.6*  --   PLT 84*  --   CREATININE 2.89* 2.86*    Estimated Creatinine Clearance: 22.7 mL/min (A) (by C-G formula based on SCr of 2.86 mg/dL (H)).   Medical History: Past Medical History:  Diagnosis Date  . 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 Intol amio, was on Sotalol, now on Lopressor  . Bladder cancer ([place]) dx'd 10/2019  . Bursitis of right shoulder   . CAD (coronary artery disease) 11/2012   CABG x 4 using left internal mammary artery and right endovein harvest. (LIMA-LAD; VG-OM1, OM2, VG-PD), EF 55-60% at cath  . Degenerative disc disease, lumbar   . Epigastric pain    Myoview 11/09/12 Showed evidence of reversible ischemia in both the inferior wall & the anteroapical distribution.   . Erectile dysfunction    H/O ED while taking a beta-blocker  . GERD (gastroesophageal reflux disease)   . Gout   . Hiatal hernia   . Hyperlipidemia   . Hypertension    Systemic HTN. Pt has H/O of ED when taking a beta-blocker. ECHO 11/26/08 Showed no evidence of any significant valvular disease, Estimated EF = 50-60%.  . Kidney stone    " i have  6-7 stones imbedded in my left kidney "   . Mononeuritis of unspecified site   . Nocturia   . NSVT (nonsustained ventricular tachycardia) ([place])   . Obesity   . 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 PAF (paroxysmal atrial  fibrillation) ([facility])    Post CABG. Amia intol. Was on Sotalol, now on Lopressor  . 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 Seasonal allergies   . significant CAD requiring CABG 12/15/2012  . Skin cancer (melanoma) ([place]) 08/04/2015   left side of face   . Skin lesion of face    hx of    Medications:  Medications Prior to Admission  Medication Sig Dispense Refill Last Dose  . acetaminophen (TYLENOL) 325 MG tablet Take 650 mg by mouth every 6 (six) hours as needed for mild pain, fever or headache.   11/09/2020 at Unknown time  . allopurinol (ZYLOPRIM) 300 MG tablet Take 300 mg by mouth daily.   11/10/2020 at Unknown time  . carvedilol (COREG) 6.25 MG tablet Take 1 tablet (6.25 mg total) by mouth 2 (two) times daily with a meal. 180 tablet 3 11/10/2020 at Unknown time  . lidocaine-prilocaine (EMLA) cream Apply 1 application topically as needed. (Patient taking differently: Apply 1 application topically as needed (port access). ) 30 g 0 11/10/2020 at Unknown time  . omeprazole (PRILOSEC) 20 MG capsule Take 20 mg by mouth every Monday, Wednesday, and Friday.    11/10/2020 at Unknown time  . prochlorperazine (  COMPAZINE) 10 MG tablet Take 1 tablet (10 mg total) by mouth every 6 (six) hours as needed for nausea or vomiting. 30 tablet 0 Past Month at Unknown time  . simvastatin (ZOCOR) 40 MG tablet Take 1 tablet (40 mg total) by mouth daily at 6 PM. 90 tablet 3 11/09/2020 at Unknown time  . triamterene-hydrochlorothiazide (MAXZIDE-25) 37.5-25 MG tablet TAKE 1 TABLET BY MOUTH 4 TIMES A WEEK (Patient taking differently: Take 1 tablet by mouth See admin instructions. 1 tablet daily on Saturday Sunday Tuesday Thursday) 16 tablet 6 11/08/2020  . mirabegron ER (MYRBETRIQ) 25 MG TB24 tablet Take 1 tablet (25 mg total) by mouth daily. (Patient not taking: Reported on 11/10/2020) 30 tablet 1 Not Taking  at Unknown time  . sildenafil (REVATIO) 20 MG tablet Take 1-3 tablets (20-60 mg total) by mouth daily as needed. (Patient not taking: Reported on 11/10/2020) 30 tablet 3 Not Taking at Unknown time  . tamsulosin (FLOMAX) 0.4 MG CAPS capsule Take 1 capsule (0.4 mg total) by mouth daily. (Patient not taking: Reported on 11/10/2020) 30 capsule 0 Not Taking at Unknown time    Assessment: Pharmacy consulted to dose heparin infusion for this patient with atrial fibrillation.  No current PTA anticoagulation although past notes state patient was on rivaroxaban (Xarelto) 2013-2014.    Goal of Therapy:  Heparin level 0.3-0.7 units/ml Monitor platelets by anticoagulation protocol: Yes   Plan:  Obtain baseline aPTT then initiate heparin at 1200 units/hour Obtain 8 hour heparin level Monitor daily heparin level, CBC, s/s bleeding   Efraim Kaufmann, PharmD, BCPS 11/10/2020,9:28 PM

## 2020-11-11 ENCOUNTER — Inpatient Hospital Stay (HOSPITAL_COMMUNITY): Payer: Medicare Other

## 2020-11-11 ENCOUNTER — Encounter (HOSPITAL_COMMUNITY): Payer: Self-pay | Admitting: Internal Medicine

## 2020-11-11 DIAGNOSIS — R6521 Severe sepsis with septic shock: Secondary | ICD-10-CM

## 2020-11-11 DIAGNOSIS — E871 Hypo-osmolality and hyponatremia: Secondary | ICD-10-CM

## 2020-11-11 DIAGNOSIS — A419 Sepsis, unspecified organism: Secondary | ICD-10-CM | POA: Diagnosis not present

## 2020-11-11 DIAGNOSIS — I480 Paroxysmal atrial fibrillation: Secondary | ICD-10-CM

## 2020-11-11 DIAGNOSIS — N1831 Chronic kidney disease, stage 3a: Secondary | ICD-10-CM

## 2020-11-11 DIAGNOSIS — N179 Acute kidney failure, unspecified: Secondary | ICD-10-CM | POA: Diagnosis not present

## 2020-11-11 LAB — BASIC METABOLIC PANEL
Anion gap: 10 (ref 5–15)
BUN: 61 mg/dL — ABNORMAL HIGH (ref 8–23)
CO2: 21 mmol/L — ABNORMAL LOW (ref 22–32)
Calcium: 7.7 mg/dL — ABNORMAL LOW (ref 8.9–10.3)
Chloride: 93 mmol/L — ABNORMAL LOW (ref 98–111)
Creatinine, Ser: 2.46 mg/dL — ABNORMAL HIGH (ref 0.61–1.24)
GFR, Estimated: 27 mL/min — ABNORMAL LOW (ref >60)
Glucose, Bld: 162 mg/dL — ABNORMAL HIGH (ref 70–99)
Potassium: 3 mmol/L — ABNORMAL LOW (ref 3.5–5.1)
Sodium: 124 mmol/L — ABNORMAL LOW (ref 135–145)

## 2020-11-11 LAB — CBC
HCT: 21.6 % — ABNORMAL LOW (ref 39.0–52.0)
Hemoglobin: 7.7 g/dL — ABNORMAL LOW (ref 13.0–17.0)
MCH: 31.7 pg (ref 26.0–34.0)
MCHC: 35.6 g/dL (ref 30.0–36.0)
MCV: 88.9 fL (ref 80.0–100.0)
Platelets: 61 10*3/uL — ABNORMAL LOW (ref 150–400)
RBC: 2.43 MIL/uL — ABNORMAL LOW (ref 4.22–5.81)
RDW: 15.5 % (ref 11.5–15.5)
WBC: 2.2 10*3/uL — ABNORMAL LOW (ref 4.0–10.5)
nRBC: 0 % (ref 0.0–0.2)

## 2020-11-11 LAB — TSH: TSH: 0.466 u[IU]/mL (ref 0.350–4.500)

## 2020-11-11 LAB — ECHOCARDIOGRAM COMPLETE
Area-P 1/2: 2.78 cm2
Height: 69 in
S' Lateral: 3.3 cm
Weight: 2848 oz

## 2020-11-11 LAB — CORTISOL-AM, BLOOD: Cortisol - AM: 7.4 ug/dL (ref 6.7–22.6)

## 2020-11-11 LAB — HEMOGLOBIN AND HEMATOCRIT, BLOOD
HCT: 21.4 % — ABNORMAL LOW (ref 39.0–52.0)
Hemoglobin: 7.5 g/dL — ABNORMAL LOW (ref 13.0–17.0)

## 2020-11-11 LAB — HEPARIN LEVEL (UNFRACTIONATED): Heparin Unfractionated: 0.54 IU/mL (ref 0.30–0.70)

## 2020-11-11 MED ORDER — CHLORHEXIDINE GLUCONATE CLOTH 2 % EX PADS
6.0000 | MEDICATED_PAD | Freq: Every day | CUTANEOUS | Status: DC
  Administered 2020-11-11 – 2020-11-15 (×5): 6 via TOPICAL

## 2020-11-11 MED ORDER — SODIUM CHLORIDE 0.9 % IV BOLUS
500.0000 mL | Freq: Once | INTRAVENOUS | Status: AC
  Administered 2020-11-11: 500 mL via INTRAVENOUS

## 2020-11-11 MED ORDER — POTASSIUM CHLORIDE CRYS ER 20 MEQ PO TBCR
40.0000 meq | EXTENDED_RELEASE_TABLET | ORAL | Status: AC
  Administered 2020-11-11 (×2): 40 meq via ORAL
  Filled 2020-11-11 (×2): qty 2

## 2020-11-11 MED ORDER — MUSCLE RUB 10-15 % EX CREA
TOPICAL_CREAM | CUTANEOUS | Status: DC | PRN
  Filled 2020-11-11: qty 85

## 2020-11-11 MED ORDER — MELATONIN 3 MG PO TABS
6.0000 mg | ORAL_TABLET | Freq: Every evening | ORAL | Status: DC | PRN
  Administered 2020-11-11: 6 mg via ORAL
  Filled 2020-11-11: qty 2

## 2020-11-11 MED ORDER — HEPARIN SODIUM (PORCINE) 5000 UNIT/ML IJ SOLN
5000.0000 [IU] | Freq: Three times a day (TID) | INTRAMUSCULAR | Status: DC
  Administered 2020-11-11 – 2020-11-15 (×12): 5000 [IU] via SUBCUTANEOUS
  Filled 2020-11-11 (×12): qty 1

## 2020-11-11 MED ORDER — ENSURE ENLIVE PO LIQD
237.0000 mL | ORAL | Status: DC
  Administered 2020-11-12 – 2020-11-14 (×3): 237 mL via ORAL

## 2020-11-11 NOTE — Consult Note (Signed)
Cardiology Consultation:   Patient ID: Hunter [name]; [id number]; March 05, 1946   Admit date: 11/10/2020 Date of Consult: 11/11/2020  Primary Care Provider: Thea [name] Primary Cardiologist: Hunter Klein, MD 08/23/2016 Primary Electrophysiologist:  None   Patient Profile:   Hunter [name] is a 74 y.o. male with a hx of CABG x4 (LIMA-LAD; VG-OM1, OM2, VG-PD, 2013),complicated by post-op Afib (previously on amio (intolerant), sotalol, now on Coreg), intolerant of metoprolol with fatigue, HTN, HLD, CKD stage III, and GERD, who is being seen today for the evaluation of atrial fibrillation at the request of Dr Tawanna Solo.  History of Present Illness:   Hunter [name] had a surgical procedure for his bladder CA and has been on chemo with cisplatin and gemcitabine.  He developed a UTI in October and feels like he has not really recovered since then.  His urine has remained orange looking and cloudy.  He has felt general malaise.  He is not aware of any fevers.    His last treatment was November 10.  After his last treatment, he started feeling bad and felt like he was going even further downhill.  He has been drinking plenty of water, he was told to get 64 ounces a day.  Despite this, he continued to feel worse.  P.o. intake was down.  He has lost almost 10 pounds.  He went to the Cancer center yesterday and was found to have Na+ 118, Cr 2.89, +UA despite recent Cipro rx. Hypotensive and in Afib RVR, started on IVF.  Cards asked to see.  Hunter [name] has not had any chest pain.  He has had some dyspnea on exertion, but associated that with the generally weak feeling.  He has not had lower extremity edema, no orthopnea or PND.  He has no awareness of the atrial fibrillation.  He has not checked his heart rate recently at home.  On 11/10, when he was there for his chemo, his heart rate was 109 at first, but was 99 by the time he left.  No notes mention the heart rate or an irregular  pulse.  He has not been lightheaded or dizzy.  He has not really followed his blood pressure at home, although he could.  He has been under stress because he has been told that he needs surgery in January.  They are going to remove his bladder, reroute the ureters, and take out part of his intestine as well.   Past Medical History:  Diagnosis Date  . Atrial fibrillation, rapid ([place])    With high ventricular rates. Intol amio, was on Sotalol, now on BB  . Bladder cancer ([place]) dx'd 10/2019  . Bursitis of right shoulder   . CAD (coronary artery disease) 11/2012   CABG x 4 using left internal mammary artery and right endovein harvest. (LIMA-LAD; VG-OM1, OM2, VG-PD), EF 55-60% at cath  . Degenerative disc disease, lumbar   . Epigastric pain    Myoview 11/09/12 Showed evidence of reversible ischemia in both the inferior wall & the anteroapical distribution.   . Erectile dysfunction    H/O ED while taking a beta-blocker  . GERD (gastroesophageal reflux disease)   . Gout   . Hiatal hernia   . Hyperlipidemia   . Hypertension    Systemic HTN. Pt has H/O of ED when taking a beta-blocker. ECHO 11/26/08 Showed no evidence of any significant valvular disease, Estimated EF = 50-60%.  . Kidney stone    " i have  6-7 stones imbedded in  my left kidney "   . Mononeuritis of unspecified site   . Nocturia   . NSVT (nonsustained ventricular tachycardia) ([place])   . Obesity   . 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 PAF (paroxysmal atrial fibrillation) ([facility])    Post CABG. Amia intol. Was on Sotalol, now on Lopressor  . 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 Seasonal allergies   . significant CAD requiring CABG 12/15/2012  . Skin cancer (melanoma) ([place]) 08/04/2015   left side of face   . Skin lesion of face    hx of    Past Surgical History:  Procedure Laterality Date   . CARDIAC CATHETERIZATION     12/08/2012  . COLONOSCOPY    . CORONARY ARTERY BYPASS GRAFT  12/12/2012   CABG x 4 using left internal mammary artery and right endovein harvest. (LIMA-LAD; VG-OM1, OM2, VG-PD)  . EYE SURGERY     had lasik surgery bilaterally 1996  . IR IMAGING GUIDED PORT INSERTION  09/10/2020  . LEFT HEART CATHETERIZATION WITH CORONARY ANGIOGRAM N/A 12/08/2012   Procedure: LEFT HEART CATHETERIZATION WITH CORONARY ANGIOGRAM;  Surgeon: Hunter Klein, MD;  Location: [facility];  Service: Cardiovascular;  Laterality: N/A;  . SHOULDER SURGERY     for effects of polio  . SKIN CANCER EXCISION  2016   left side of face   . TRANSURETHRAL RESECTION OF BLADDER TUMOR N/A 11/16/2019   Procedure: TRANSURETHRAL RESECTION OF BLADDER TUMOR (TURBT) WITH INSTILLATION OF POST OPERATIVE CHEMOTHERAPY;  Surgeon: Kathie Rhodes, MD;  Location: [facility];  Service: Urology;  Laterality: N/A;  . TRANSURETHRAL RESECTION OF BLADDER TUMOR WITH MITOMYCIN-C N/A 08/12/2020   Procedure: TRANSURETHRAL RESECTION OF BLADDER TUMOR WITH GEMCITABINE;  Surgeon: Robley Fries, MD;  Location: [facility];  Service: Urology;  Laterality: N/A;  1 HR  . UPPER GI ENDOSCOPY       Prior to Admission medications   Medication Sig Start Date End Date Taking? Authorizing Provider  acetaminophen (TYLENOL) 325 MG tablet Take 650 mg by mouth every 6 (six) hours as needed for mild pain, fever or headache.   Yes [provider]  allopurinol (ZYLOPRIM) 300 MG tablet Take 300 mg by mouth daily.   Yes [provider]  carvedilol (COREG) 6.25 MG tablet Take 1 tablet (6.25 mg total) by mouth 2 (two) times daily with a meal. 08/07/20  Yes Karista Aispuro, Evelene Croon, PA-C  lidocaine-prilocaine (EMLA) cream Apply 1 application topically as needed. Patient taking differently: Apply 1 application topically as needed (port access).  09/03/20  Yes Wyatt Portela, MD  omeprazole (PRILOSEC) 20 MG capsule Take 20 mg by mouth every Monday,  Wednesday, and Friday.    Yes [provider]  prochlorperazine (COMPAZINE) 10 MG tablet Take 1 tablet (10 mg total) by mouth every 6 (six) hours as needed for nausea or vomiting. 09/03/20  Yes Wyatt Portela, MD  simvastatin (ZOCOR) 40 MG tablet Take 1 tablet (40 mg total) by mouth daily at 6 PM. 08/07/20  Yes Mylez Venable, Evelene Croon, PA-C  triamterene-hydrochlorothiazide (MAXZIDE-25) 37.5-25 MG tablet TAKE 1 TABLET BY MOUTH 4 TIMES A WEEK Patient taking differently: Take 1 tablet by mouth See admin instructions. 1 tablet daily on Saturday Sunday Tuesday Thursday 08/07/20  Yes Jahfari Ambers, Evelene Croon, PA-C  mirabegron ER (MYRBETRIQ) 25 MG TB24 tablet Take 1  tablet (25 mg total) by mouth daily. Patient not taking: Reported on 11/10/2020 10/14/20   Wyatt Portela, MD  sildenafil (REVATIO) 20 MG tablet Take 1-3 tablets (20-60 mg total) by mouth daily as needed. Patient not taking: Reported on 11/10/2020 08/07/20   Kalsey Lull, Evelene Croon, PA-C  tamsulosin (FLOMAX) 0.4 MG CAPS capsule Take 1 capsule (0.4 mg total) by mouth daily. Patient not taking: Reported on 11/10/2020 08/12/20   Robley Fries, MD    Inpatient Medications: Scheduled Meds: . Chlorhexidine Gluconate Cloth  6 each Topical Daily  . doxycycline  100 mg Oral Q12H  . potassium chloride  40 mEq Oral Q4H  . simvastatin  40 mg Oral q1800  . sodium chloride flush  3 mL Intravenous Q12H   Continuous Infusions: . sodium chloride    . heparin 1,200 Units/hr (11/10/20 2315)   PRN Meds: acetaminophen **OR** acetaminophen, polyethylene glycol, prochlorperazine  Allergies:    Allergies  Allergen Reactions  . Amiodarone     fasciculations  . Oxycodone Other (See Comments)    Bad dreams    Social History:   Social History   Socioeconomic History  . Marital status: Married    Spouse name: Not on file  . Number of children: 3  . Years of education: Not on file  . Highest education level: Not on file  Occupational History  .  Occupation: Higher education careers adviser (RETIRED)    Employer: [employer]  Tobacco Use  . Smoking status: Never Smoker  . Smokeless tobacco: Never Used  Substance and Sexual Activity  . Alcohol use: Yes    Comment: social, very little amount, tequila  . Drug use: No  . Sexual activity: Not on file  Other Topics Concern  . Not on file  Social History Narrative  . Not on file   Social Determinants of Health   Financial Resource Strain:   . Difficulty of Paying Living Expenses: Not on file  Food Insecurity:   . Worried About Charity fundraiser in the Last Year: Not on file  . Ran Out of Food in the Last Year: Not on file  Transportation Needs:   . Lack of Transportation (Medical): Not on file  . Lack of Transportation (Non-Medical): Not on file  Physical Activity:   . Days of Exercise per Week: Not on file  . Minutes of Exercise per Session: Not on file  Stress:   . Feeling of Stress : Not on file  Social Connections:   . Frequency of Communication with Friends and Family: Not on file  . Frequency of Social Gatherings with Friends and Family: Not on file  . Attends Religious Services: Not on file  . Active Member of Clubs or Organizations: Not on file  . Attends Archivist Meetings: Not on file  . Marital Status: Not on file  Intimate Partner Violence:   . Fear of Current or Ex-Partner: Not on file  . Emotionally Abused: Not on file  . Physically Abused: Not on file  . Sexually Abused: Not on file    Family History:   Family History  Problem Relation Age of Onset  . CAD Father   . Hypertension Father   . AAA (abdominal aortic aneurysm) Father   . Aneurysm Father        Cause of death  . Heart attack Paternal Grandfather   . Cancer Mother        Colon. Cause of death   Family Status:  Family Status  Relation Name Status  . Father  Deceased at age 66       Ruptured aortic aneurysm  . PGF  Deceased at age 69  . Sister  Alive  . Brother  Alive  . MGM   Deceased  . MGF  Deceased  . PGM  Deceased  . Child  Alive  . Child  Alive  . Child  Alive  . Mother  Deceased       Colon Cancer    ROS:  Please see the history of present illness.  All other ROS reviewed and negative.     Physical Exam/Data:   Vitals:   11/10/20 2107 11/10/20 2227 11/11/20 0021 11/11/20 0432  BP: 103/66 (!) 90/59 107/70 97/62  Pulse: 83 72 78 100  Resp: 20  20 20   Temp: (!) 97.4 F (36.3 C)  97.6 F (36.4 C) 97.6 F (36.4 C)  TempSrc: Oral  Oral Oral  SpO2: 98%  97% 96%  Weight:      Height:        Intake/Output Summary (Last 24 hours) at 11/11/2020 1133 Last data filed at 11/11/2020 1002 Gross per 24 hour  Intake 200 ml  Output 1850 ml  Net -1650 ml    Last 3 Weights 11/10/2020 11/10/2020 11/05/2020  Weight (lbs) 178 lb 177 lb 175 lb 12 oz  Weight (kg) 80.74 kg 80.287 kg 79.72 kg     Body mass index is 26.29 kg/m.   General:  Well nourished, well developed, male in no acute distress HEENT: normal Lymph: no adenopathy Neck: JVD -not elevated Endocrine:  No thryomegaly Vascular: No carotid bruits; 4/4 extremity pulses 2+  Cardiac:  normal S1, S2; irregular rate and rhythm; no murmur Lungs:  clear bilaterally, no wheezing, rhonchi or rales  Abd: soft, nontender, no hepatomegaly  Ext: no edema Musculoskeletal:  No deformities, BUE and BLE strength normal and equal Skin: warm and dry  Neuro:  CNs 2-12 intact, no focal abnormalities noted Psych:  Normal affect   EKG:  The EKG was personally reviewed and demonstrates: Atrial fibrillation, RVR, heart rate 112, 8/12 ECG was sinus rhythm Telemetry:  Telemetry was personally reviewed and demonstrates: Atrial fibrillation, heart rate initially elevated but has been slowly trending down to normal overnight.   CV studies:   ECHO: Ordered, none in the system  CATH: None since CABG   Laboratory Data:   Chemistry Recent Labs  Lab 11/10/20 1136 11/10/20 1136 11/10/20 1630 11/10/20 2125  11/11/20 0616  NA 118*   < > 121* 122* 124*  K 3.2*  --  4.0  --  3.0*  CL 84*  --  87*  --  93*  CO2 21*  --  22  --  21*  GLUCOSE 184*  --  145*  --  162*  BUN 62*  --  63*  --  61*  CREATININE 2.89*  --  2.86*  --  2.46*  CALCIUM 8.8*  --  8.3*  --  7.7*  GFRNONAA 22*  --  22*  --  27*  ANIONGAP 13  --  12  --  10   < > = values in this interval not displayed.    Lab Results  Component Value Date   ALT 46 (H) 11/10/2020   AST 66 (H) 11/10/2020   ALKPHOS 92 11/10/2020   BILITOT 0.7 11/10/2020   Hematology Recent Labs  Lab 11/05/20 1355 11/10/20 1136 11/11/20 0616  WBC 8.3 2.6* 2.2*  RBC  3.87* 3.06* 2.43*  HGB 12.1* 9.6* 7.7*  HCT 35.0* 26.6* 21.6*  MCV 90.4 86.9 88.9  MCH 31.3 31.4 31.7  MCHC 34.6 36.1* 35.6  RDW 15.9* 15.2 15.5  PLT 226 84* 61*   Cardiac Enzymes High Sensitivity Troponin:  No results for input(s): TROPONINIHS in the last 720 hours.    BNPNo results for input(s): BNP, PROBNP in the last 168 hours.  DDimer No results for input(s): DDIMER in the last 168 hours. TSH:  Lab Results  Component Value Date   TSH 0.466 11/11/2020   Lipids: Lab Results  Component Value Date   CHOL 128 09/26/2014   HDL 46 09/26/2014   LDLCALC 53 09/26/2014   LDLDIRECT 83.9 10/26/2010   TRIG 146 09/26/2014   CHOLHDL 2.8 09/26/2014   HgbA1c: Lab Results  Component Value Date   HGBA1C 6.3 (H) 2020/11/12   Magnesium:  Magnesium  Date Value Ref Range Status  11/12/20 2.2 1.7 - 2.4 mg/dL Final    Comment:    Performed at [facility], [place] [address], [place], [place] [address]     Radiology/Studies:  US RENAL  Result Date: November 12, 2020 CLINICAL DATA:  Acute renal failure, chronic kidney disease EXAM: RENAL / URINARY TRACT ULTRASOUND COMPLETE COMPARISON:  CT 09/09/2020 FINDINGS: Right Kidney: Renal measurements: 12.7 x 5.9 x 7.2 cm = volume: 127 mL. Large cysts, the largest in the upper pole measures up to 5.8 cm. Cortical thinning. Normal  echotexture. No hydronephrosis. Left Kidney: Renal measurements: 12.8 x 5.5 x 8.2 cm = volume: 229 mL. Left lower pole stone measures 10 mm. Mild hydronephrosis. Normal echotexture. Mild cortical thinning. Bladder: Irregular bladder wall.  Mildly enlarged prostate. Other: None. IMPRESSION: Mild left hydronephrosis.  Left lower pole renal stone. Right renal cysts appear benign. Markedly irregular bladder wall, similar to prior CT. Prostate enlargement. Electronically Signed   By: Rolm Baptise M.D.   On: 12-Nov-2020 19:24    Assessment and Plan:   1. Persistent Atrial fibrillation, RVR -Heart rate has gradually improved with hydration. -SBP 90s-100s, so carvedilol has been held. -He is on heparin for anticoagulation - CHA2DS2-VASc Score = 3  indicating a 3.2% annual risk of stroke.  The patient's score is based upon: CHF History: 0 HTN History: 1 Diabetes History: 0 Stroke History: 0 Vascular Disease History: 1 Age Score: 1 Gender Score: 0 -Currently on heparin, change to Eliquis when able  2.  Dehydration, hyponatremia, hypokalemia, acute kidney injury -His potassium is being aggressively supplemented, he will get 80 mEq total today. -He is being hydrated, his sodium is improving as is his creatinine -Per IM  3.  Acute UTI -He is on antibiotics per IM, urine culture has been reintubated for better growth, and blood cultures are pending -If blood cultures come back positive, may need TEE -Urine culture 11/11 showed 80,000 colonies staph epidermidis with minimal antibiotic resistance -10/20 urine culture showed greater than 100,000 colonies of staph epidermidis  Otherwise, per IM  Signed,  Rosaria Ferries, PA-C    11/11/2020 11:33 AM     Principal Problem:   Hyponatremia Active Problems:   Essential hypertension   CAD s/p CABG   Malignant neoplasm of urinary bladder ([place])   Acute lower UTI   AKI (acute kidney injury) ([place])     For questions or updates, please contact [facility] Please consult [email or web address] for contact info under Cardiology/STEMI.   SignedRosaria Ferries, PA-C  11/11/2020 11:33 AM

## 2020-11-11 NOTE — Progress Notes (Signed)
  Echocardiogram 2D Echocardiogram has been performed.  Hunter [name] 11/11/2020, 4:06 PM

## 2020-11-11 NOTE — Progress Notes (Signed)
ANTICOAGULATION CONSULT NOTE - Follow Up Consult  Pharmacy Consult for Heparin Indication: atrial fibrillation  Allergies  Allergen Reactions  . Amiodarone     fasciculations  . Oxycodone Other (See Comments)    Bad dreams    Patient Measurements: Height: 5\' 9"  (175.3 cm) Weight: 80.7 kg (178 lb) IBW/kg (Calculated) : 70.7 Heparin Dosing Weight: actual weight  Vital Signs: Temp: 97.6 F (36.4 C) (11/16 0432) Temp Source: Oral (11/16 0432) BP: 97/62 (11/16 0432) Pulse Rate: 100 (11/16 0432)  Labs: Recent Labs    11/10/20 1136 11/10/20 1630 11/10/20 2125 11/11/20 0616  HGB 9.6*  --   --  7.7*  HCT 26.6*  --   --  21.6*  PLT 84*  --   --  PENDING  APTT  --   --  31  --   HEPARINUNFRC  --   --   --  0.54  CREATININE 2.89* 2.86*  --   --     Estimated Creatinine Clearance: 22.7 mL/min (A) (by C-G formula based on SCr of 2.86 mg/dL (H)).   Medications:  Scheduled:  . doxycycline  100 mg Oral Q12H  . simvastatin  40 mg Oral q1800  . sodium chloride flush  3 mL Intravenous Q12H   Infusions:  . sodium chloride    . heparin 1,200 Units/hr (11/10/20 2315)   PRN: acetaminophen **OR** acetaminophen, polyethylene glycol, prochlorperazine  Assessment: Pharmacy consulted to dose heparin infusion for this patient with atrial fibrillation, CHA2DS2-VASc: 3.  PMH includes bladder cancer on chemo, melanoma, CAD s/p CABG in 2013, chronic hyponatremia, hypertension, hyperlipidemia, gout and chronic UTIs. No current PTA anticoagulation although past notes state patient was on rivaroxaban (Xarelto) 2013-2014 for afib.    Today, 11/11/2020  First heparin level is therapeutic (0.54) on 1200 units/hr  CBC: Hgb decreased 7.7 (9.6 on admit); Plts 61 (84 on admit) - last chemo cisplantin 11/3, gemzar 11/10  No overt bleeding reported  Goal of Therapy:  Heparin level 0.3-0.7 units/ml Monitor platelets by anticoagulation protocol: Yes   Plan:   Continue heparin at 1200  units/hr  Recheck heparin level in 8hrs  Monitor closely for signs/symptoms of bleeding  Peggyann Juba, PharmD, BCPS Pharmacy: [phone] 11/11/2020,6:58 AM

## 2020-11-11 NOTE — Progress Notes (Signed)
Initial Nutrition Assessment  INTERVENTION:   -Ensure Enlive po daily, each supplement provides 350 kcal and 20 grams of protein -Magic cup BID with meals, each supplement provides 290 kcal and 9 grams of protein  NUTRITION DIAGNOSIS:   Increased nutrient needs related to cancer and cancer related treatments as evidenced by estimated needs.  GOAL:   Patient will meet greater than or equal to 90% of their needs  MONITOR:   Supplement acceptance, PO intake, Labs, Weight trends, I & O's  REASON FOR ASSESSMENT:   Malnutrition Screening Tool    ASSESSMENT:   74 year old male with past medical history of bladder cancer on gemcitabine and cisplatin, melanoma, CAD s/p CABG in 2013, chronic hyponatremia, hypertension, hyperlipidemia, gout, atrial fibrillation not on anticoagulation, recurrent staph epi UTIs who was a direct admission from the cancer center today for abnormal lab work.  Patient undergoing chemotherapy for bladder cancer, last treatment was on 11/10. Pt has had poor appetite since 2 treatments ago. Pt has been drinking fluids. Will order Ensure and Magic cup supplements for additional kcals and protein.  Per weight records, pt has lost 11 lbs since 8/13 (5% wt loss x 3 months, insignificant for time frame).   Medications: KLOR-CON   Labs reviewed: Low Na, K  NUTRITION - FOCUSED PHYSICAL EXAM:  Unable to complete, will attempt at follow-up  Diet Order:   Diet Order            Diet Heart Room service appropriate? Yes; Fluid consistency: Thin  Diet effective now                 EDUCATION NEEDS:   No education needs have been identified at this time  Skin:  Skin Assessment: Reviewed RN Assessment  Last BM:  11/16  Height:   Ht Readings from Last 1 Encounters:  11/10/20 5\' 9"  (1.753 m)    Weight:   Wt Readings from Last 1 Encounters:  11/10/20 80.7 kg   BMI:  Body mass index is 26.29 kg/m.  Estimated Nutritional Needs:   Kcal:   2200-2400  Protein:  115-125g  Fluid:  2.2L/day   Clayton Bibles, MS, RD, LDN Inpatient Clinical Dietitian Contact information available via Amion

## 2020-11-11 NOTE — Progress Notes (Signed)
RN called to room by pt and wife. Urine noted to be darker in color and blood tinged. MD and pharmacy notified. No orders received. Will continue to monitor.

## 2020-11-11 NOTE — Progress Notes (Signed)
[facility] Progress Note  Subjective: Na + up to 124 at 6 am today.  BP's good and creat down to 2.4 from 2.8 yesterday.  Pt seen in room, no new c/o.   Vitals:   11/10/20 2227 11/11/20 0021 11/11/20 0432 11/11/20 1412  BP: (!) 90/59 107/70 97/62 100/65  Pulse: 72 78 100 68  Resp:  _0 Temp:  97.6 F (36.4 C) 97.6 F (36.4 C) (!) 97.4 F (36.3 C)  TempSrc:  Oral Oral Oral  SpO2:  97% 96% 98%  Weight:      Height:        Exam: Gen alert, R upper chest port, pleasant No jvd or bruits Chest clear bilat to bases RRR no MRG Abd soft ntnd no mass or ascites +bs Ext no leg or UE edema Neuro is alert, Ox 3 , nf    Home meds:  - coreg 6.25 bid/ zocor 40 qd/ maxzide 37.5-25 mg take 1 4d /wk  - mirabegron ER qd/ revatio prn/ compazine prn/ prilosec 20 qd  - zyloprim 33 qd    UA 11/15 - cloudy, 100 prot, many bact, 11-20 rbc, >50 wbc      Baseline creat 1.3- 1.6 from 2020 - Oct 2021, eGFR 46- 51     Last creat on 11/3 1.44, on 11/10 2.25, today 2.89     UNa 22, UCr 55  , Uosm 296     Renal US - 12.7 R kidney, large cysts, cortical thinning, no hydro. L kidney 12.8cm, mild hydro, normal echotexture, mild cort thinning.   Assessment/ Plan: 1. 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 BP's soft on admit, pt was not eating well at home. Suspect AKI due to vol depletion, also poss cisplatin toxicity but less likely. Hx of CKD 3a for many years. Renal US w/ mild cortical thinning and mild L hydro.  Creat improving today w/ vol repletion. Will cont IVF's. Will follow.  2. Hyponatremia - suspect hypovolemic, Na+ improving w/ isotonic saline.  TSH and cortisol okay. Holding home diuretics. Cont IVF's for #1/ #2.  3. UTI - sp IV vanc/ ceph, now on po doxy 4. HTN - holding coreg and diuretics, bp's remain soft, cont IVF's 5. Bladder Ca - per ONC, primary team 6. CAD hx CABG 7. Atrial fib - per primary, cardiology       Rob  Annise Boran 11/11/2020, 2:59 PM   Recent Labs  Lab 11/10/20 1136 11/10/20 1136 11/10/20 1630 11/11/20 0616  K 3.2*   < > 4.0 3.0*  BUN 62*   < > 63* 61*  CREATININE 2.89*  --  2.86* 2.46*  CALCIUM 8.8*   < > 8.3* 7.7*  HGB 9.6*  --   --  7.7*   < > = values in this interval not displayed.   Inpatient medications: . Chlorhexidine Gluconate Cloth  6 each Topical Daily  . doxycycline  100 mg Oral Q12H  . heparin injection (subcutaneous)  5,000 Units Subcutaneous Q8H  . simvastatin  40 mg Oral q1800  . sodium chloride flush  3 mL Intravenous Q12H   . sodium chloride     acetaminophen **OR** acetaminophen, polyethylene glycol, prochlorperazine

## 2020-11-11 NOTE — Progress Notes (Addendum)
PROGRESS NOTE    Hunter [name]  RFF:[id number] DOB: Mar 10, 1946 DOA: 11/10/2020 PCP: [name] E   Chief Complain: Abnormal blood work  Brief Narrative: Patient is a 74 year old male with history of bladder cancer on gemcitabine/cisplatin, melanoma, coronary artery disease status post CABG in 2013, chronic hyponatremia, hypertension, hyperlipidemia, gout, paroxysmal A. fib not on anticoagulation, recurrent UTIs who was sent from cancer center for direct admission for the evaluation of hyponatremia, AKI.  Patient reported feeling fatigued, dizzy since his most recent chemotherapy, also poor oral intake.  He was noted to have sodium of 118 and creatinine of 2.8 at the cancer center.  On presentation, he was hemodynamically stable, afebrile.  EKG done at the bedside showed A. fib with RVR, his blood pressure was soft.  Patient was admitted for the evaluation and management.  Nephrology, cardiology following.  Assessment & Plan:   Principal Problem:   Hyponatremia Active Problems:   Essential hypertension   CAD s/p CABG   Malignant neoplasm of urinary bladder (HCC)   Acute lower UTI   Acute renal failure superimposed on stage 3a chronic kidney disease (HCC)   Sepsis with acute renal failure and septic shock (HCC)   AKI on CKD stage IIIa: renal US/bladder showed mild left hydronephrosis,left lower pole renal stone.Markedly irregular bladder wall, similar to prior CT. CT abdomen/pelvis on 09/09/2020 showed extensive thickening of the superior urinary bladder wall with multiple small diverticula and extensive fat stranding consistent with bladder malignancy. Patient presumed to be dehydrated on presentation and was started on IV fluids.  AKI could also be from nephrotoxicity from chemotherapy.Now  he is having good urine output. We will check BMP tomorrow.  Avoid nephrotoxins.  Nephrology following.  Baseline creatinine ranges from 1.3-1.6  Acute on chronic hyponatremia: Baseline  sodium around low 130s.  Presented with sodium of 118.  Continue IV fluids.  Sodium level is improving  Paroxysmal A. fib: On RVR on presentation.  He had history of A. fib several years ago and was on Xarelto which has been discontinued.  He was started on heparin drip during this admission.  He is still on A. fib but rate is controlled.  Cardiology consulted.CHA2DS2-VASc: 3 .  Patient is complaining of hematuria and there is drop of hemoglobin from 9 to 7 today so heparin discontinued for now.  History of recurrent UTI: History of recurrent staph epidermidis UTI.  UA was suggestive of UTI.  Patient denies any dysuria.  Started on doxycycline.  Check urine culture  Normocytic anemia: Hemoglobin dropped to the range of 7, part of it could be from dilution.  Also could be from hematuria from bladder cancer.  We will continue to monitor hemoglobin.  We will transfuse if hemoglobin drops less than 7  Hypokalemia: Being supplemented and monitored  Hypertension: Currently blood pressure is soft.  Antihypertensives on hold.  Bladder cancer: Follows with Dr. Alen Blew.  Recently on cisplatin/gemcitabine.  Complains of some hematuria.  We will continue to monitor.  If hematuria worsens, will order CT scan of the pelvis  History of coronary disease: Status post CABG in 2013.  On aspirin and statin at home.  Generalized weakness: We will request for PT/OT evaluation when appropriate.         DVT prophylaxis: Heparin subcu Code Status: None Family Communication: Wife at bedside Status is: Inpatient  Remains inpatient appropriate because:Inpatient level of care appropriate due to severity of illness   Dispo: The patient is from: Home  Anticipated d/c is to: Home              Anticipated d/c date is: 3 days              Patient currently is not medically stable to d/c.    Consultants: Cardiology, nephrology  Procedures: None  Antimicrobials:  Anti-infectives (From admission,  onward)   Start     Dose/Rate Route Frequency Ordered Stop   11/10/20 2200  doxycycline (VIBRA-TABS) tablet 100 mg        100 mg Oral Every 12 hours 11/10/20 1748 11/15/20 2159   11/10/20 1600  vancomycin (VANCOREADY) IVPB 1250 mg/250 mL  Status:  Discontinued        1,250 mg 166.7 mL/hr over 90 Minutes Intravenous Every 48 hours 11/10/20 1524 11/10/20 1717      Subjective: Patient seen and examined at the bedside this morning.  He was feeling better today.  He was having good urine output, denies any abdominal pain, nausea or vomiting.  Objective: Vitals:   11/10/20 2227 11/11/20 0021 11/11/20 0432 11/11/20 1412  BP: (!) 90/59 107/70 97/62 100/65  Pulse: 72 78 100 68  Resp:  20 20 20   Temp:  97.6 F (36.4 C) 97.6 F (36.4 C) (!) 97.4 F (36.3 C)  TempSrc:  Oral Oral Oral  SpO2:  97% 96% 98%  Weight:      Height:        Intake/Output Summary (Last 24 hours) at 11/11/2020 1509 Last data filed at 11/11/2020 1334 Gross per 24 hour  Intake 200 ml  Output 2175 ml  Net -1975 ml   Filed Weights   11/10/20 1609  Weight: 80.7 kg    Examination:  General exam: not in distress,average built HEENT:PERRL,Oral mucosa moist, Ear/Nose normal on gross exam Respiratory system: Bilateral equal air entry, normal vesicular breath sounds, no wheezes or crackles  Cardiovascular system: S1 & S2 heard, RRR. No JVD, murmurs, rubs, gallops or clicks. No pedal edema.  Chemo-Port on the right chest Gastrointestinal system: Abdomen is nondistended, soft and nontender. No organomegaly or masses felt. Normal bowel sounds heard. Central nervous system: Alert and oriented.  Extremities: No edema, no clubbing ,no cyanosis Skin: No rashes, lesions or ulcers,no icterus ,no pallor   Data Reviewed: I have personally reviewed following labs and imaging studies  CBC: Recent Labs  Lab 11/05/20 1355 11/10/20 1136 11/11/20 0616  WBC 8.3 2.6* 2.2*  NEUTROABS 7.2 2.2  --   HGB 12.1* 9.6* 7.7*  HCT  35.0* 26.6* 21.6*  MCV 90.4 86.9 88.9  PLT 226 84* 61*   Basic Metabolic Panel: Recent Labs  Lab 11/05/20 1355 11/10/20 1136 11/10/20 1630 11/10/20 2125 11/11/20 0616  NA 129* 118* 121* 122* 124*  K 3.8 3.2* 4.0  --  3.0*  CL 91* 84* 87*  --  93*  CO2 25 21* 22  --  21*  GLUCOSE 132* 184* 145*  --  162*  BUN 38* 62* 63*  --  61*  CREATININE 2.25* 2.89* 2.86*  --  2.46*  CALCIUM 9.1 8.8* 8.3*  --  7.7*  MG  --   --  2.2  --   --    GFR: Estimated Creatinine Clearance: 26.3 mL/min (A) (by C-G formula based on SCr of 2.46 mg/dL (H)). Liver Function Tests: Recent Labs  Lab 11/05/20 1355 11/10/20 1136  AST 20 66*  ALT 13 46*  ALKPHOS 83 92  BILITOT 0.8 0.7  PROT 7.2 6.5  ALBUMIN 3.2* 2.4*   No results for input(s): LIPASE, AMYLASE in the last 168 hours. No results for input(s): AMMONIA in the last 168 hours. Coagulation Profile: No results for input(s): INR, PROTIME in the last 168 hours. Cardiac Enzymes: No results for input(s): CKTOTAL, CKMB, CKMBINDEX, TROPONINI in the last 168 hours. BNP (last 3 results) No results for input(s): PROBNP in the last 8760 hours. HbA1C: Recent Labs    11/10/20 2125  HGBA1C 6.3*   CBG: No results for input(s): GLUCAP in the last 168 hours. Lipid Profile: No results for input(s): CHOL, HDL, LDLCALC, TRIG, CHOLHDL, LDLDIRECT in the last 72 hours. Thyroid Function Tests: Recent Labs    11/11/20 0616  TSH 0.466   Anemia Panel: No results for input(s): VITAMINB12, FOLATE, FERRITIN, TIBC, IRON, RETICCTPCT in the last 72 hours. Sepsis Labs: No results for input(s): PROCALCITON, LATICACIDVEN in the last 168 hours.  Recent Results (from the past 240 hour(s))  Urine Culture     Status: Abnormal   Collection Time: 11/06/20  4:11 PM   Specimen: Urine, Clean Catch  Result Value Ref Range Status   Specimen Description   Final    URINE, CLEAN CATCH Performed at [facility], [address] [place],  [place] [address]    Special Requests   Final    NONE Performed at [facility], [place] [address] [place], [place] [address]    Culture 80,000 COLONIES/mL STAPHYLOCOCCUS EPIDERMIDIS (A)  Final   Report Status 11/08/2020 FINAL  Final   Organism ID, Bacteria STAPHYLOCOCCUS EPIDERMIDIS (A)  Final      Susceptibility   Staphylococcus epidermidis - MIC*    CIPROFLOXACIN <=0.5 SENSITIVE Sensitive     GENTAMICIN <=0.5 SENSITIVE Sensitive     NITROFURANTOIN <=16 SENSITIVE Sensitive     OXACILLIN >=4 RESISTANT Resistant     TETRACYCLINE 2 SENSITIVE Sensitive     VANCOMYCIN 2 SENSITIVE Sensitive     TRIMETH/SULFA 160 RESISTANT Resistant     CLINDAMYCIN <=0.25 SENSITIVE Sensitive     RIFAMPIN <=0.5 SENSITIVE Sensitive     Inducible Clindamycin NEGATIVE Sensitive     * 80,000 COLONIES/mL STAPHYLOCOCCUS EPIDERMIDIS  Urine Culture     Status: Abnormal (Preliminary result)   Collection Time: 11/10/20 11:48 AM   Specimen: Urine, Clean Catch  Result Value Ref Range Status   Specimen Description   Final    URINE, CLEAN CATCH Performed at [facility], [place] [address], [place], [place] [address]    Special Requests   Final    NONE Performed at [facility], [place] [address], [place], [place] [address]    Culture (A)  Final    >=100,000 COLONIES/mL STAPHYLOCOCCUS EPIDERMIDIS SUSCEPTIBILITIES TO FOLLOW Performed at [facility], [place] [address], [place], [place] [address]    Report Status PENDING  Incomplete  Respiratory Panel by RT PCR (Flu A&B, Covid) - Nasopharyngeal Swab     Status: None   Collection Time: 11/10/20  3:48 PM   Specimen: Nasopharyngeal Swab  Result Value Ref Range Status   SARS Coronavirus 2 by RT PCR NEGATIVE NEGATIVE Final    Comment: (NOTE) SARS-CoV-2 target nucleic acids are NOT DETECTED.  The SARS-CoV-2 RNA is generally detectable in upper respiratoy specimens during the acute phase of infection.  The lowest concentration of SARS-CoV-2 viral copies this assay can detect is 131 copies/mL. A negative result does not preclude SARS-Cov-2 infection and should not be used  as the sole basis for treatment or other patient management decisions. A negative result may occur with  improper specimen collection/handling, submission of specimen other than nasopharyngeal swab, presence of viral mutation(s) within the areas targeted by this assay, and inadequate number of viral copies (<131 copies/mL). A negative result must be combined with clinical observations, patient history, and epidemiological information. The expected result is Negative.  Fact Sheet for Patients:  PinkCheek.be  Fact Sheet for Healthcare Providers:  GravelBags.it  This test is no t yet approved or cleared by the [place] FDA and  has been authorized for detection and/or diagnosis of SARS-CoV-2 by FDA under an Emergency Use Authorization (EUA). This EUA will remain  in effect (meaning this test can be used) for the duration of the COVID-19 declaration under Section 564(b)(1) of the Act, 21 U.S.C. section 360bbb-3(b)(1), unless the authorization is terminated or revoked sooner.     Influenza A by PCR NEGATIVE NEGATIVE Final   Influenza B by PCR NEGATIVE NEGATIVE Final    Comment: (NOTE) The Xpert Xpress SARS-CoV-2/FLU/RSV assay is intended as an aid in  the diagnosis of influenza from Nasopharyngeal swab specimens and  should not be used as a sole basis for treatment. Nasal washings and  aspirates are unacceptable for Xpert Xpress SARS-CoV-2/FLU/RSV  testing.  Fact Sheet for Patients: PinkCheek.be  Fact Sheet for Healthcare Providers: GravelBags.it  This test is not yet approved or cleared by the [place] FDA and  has been authorized for detection and/or diagnosis of SARS-CoV-2 by  FDA  under an Emergency Use Authorization (EUA). This EUA will remain  in effect (meaning this test can be used) for the duration of the  Covid-19 declaration under Section 564(b)(1) of the Act, 21  U.S.C. section 360bbb-3(b)(1), unless the authorization is  terminated or revoked. Performed at [facility], [place] [address], [place], [place] [address]          Radiology Studies: US RENAL  Result Date: 11/10/2020 CLINICAL DATA:  Acute renal failure, chronic kidney disease EXAM: RENAL / URINARY TRACT ULTRASOUND COMPLETE COMPARISON:  CT 09/09/2020 FINDINGS: Right Kidney: Renal measurements: 12.7 x 5.9 x 7.2 cm = volume: 127 mL. Large cysts, the largest in the upper pole measures up to 5.8 cm. Cortical thinning. Normal echotexture. No hydronephrosis. Left Kidney: Renal measurements: 12.8 x 5.5 x 8.2 cm = volume: 229 mL. Left lower pole stone measures 10 mm. Mild hydronephrosis. Normal echotexture. Mild cortical thinning. Bladder: Irregular bladder wall.  Mildly enlarged prostate. Other: None. IMPRESSION: Mild left hydronephrosis.  Left lower pole renal stone. Right renal cysts appear benign. Markedly irregular bladder wall, similar to prior CT. Prostate enlargement. Electronically Signed   By: Rolm Baptise M.D.   On: 11/10/2020 19:24        Scheduled Meds: . Chlorhexidine Gluconate Cloth  6 each Topical Daily  . doxycycline  100 mg Oral Q12H  . heparin injection (subcutaneous)  5,000 Units Subcutaneous Q8H  . simvastatin  40 mg Oral q1800  . sodium chloride flush  3 mL Intravenous Q12H   Continuous Infusions: . sodium chloride       LOS: 1 day    Time spent:35 mins, More than 50% of that time was spent in counseling and/or coordination of care.      Shelly Coss, MD [facility] P11/16/2021, 3:09 PM

## 2020-11-12 ENCOUNTER — Encounter (HOSPITAL_COMMUNITY): Payer: Self-pay | Admitting: Internal Medicine

## 2020-11-12 DIAGNOSIS — I10 Essential (primary) hypertension: Secondary | ICD-10-CM | POA: Diagnosis not present

## 2020-11-12 DIAGNOSIS — I2510 Atherosclerotic heart disease of native coronary artery without angina pectoris: Secondary | ICD-10-CM | POA: Diagnosis not present

## 2020-11-12 DIAGNOSIS — E871 Hypo-osmolality and hyponatremia: Secondary | ICD-10-CM | POA: Diagnosis not present

## 2020-11-12 DIAGNOSIS — I480 Paroxysmal atrial fibrillation: Secondary | ICD-10-CM | POA: Diagnosis not present

## 2020-11-12 LAB — CBC
HCT: 21.8 % — ABNORMAL LOW (ref 39.0–52.0)
Hemoglobin: 7.6 g/dL — ABNORMAL LOW (ref 13.0–17.0)
MCH: 31.7 pg (ref 26.0–34.0)
MCHC: 34.9 g/dL (ref 30.0–36.0)
MCV: 90.8 fL (ref 80.0–100.0)
Platelets: 56 10*3/uL — ABNORMAL LOW (ref 150–400)
RBC: 2.4 MIL/uL — ABNORMAL LOW (ref 4.22–5.81)
RDW: 15.6 % — ABNORMAL HIGH (ref 11.5–15.5)
WBC: 4.2 10*3/uL (ref 4.0–10.5)
nRBC: 0.5 % — ABNORMAL HIGH (ref 0.0–0.2)

## 2020-11-12 LAB — BASIC METABOLIC PANEL
Anion gap: 7 (ref 5–15)
BUN: 57 mg/dL — ABNORMAL HIGH (ref 8–23)
CO2: 20 mmol/L — ABNORMAL LOW (ref 22–32)
Calcium: 7.9 mg/dL — ABNORMAL LOW (ref 8.9–10.3)
Chloride: 102 mmol/L (ref 98–111)
Creatinine, Ser: 2.03 mg/dL — ABNORMAL HIGH (ref 0.61–1.24)
GFR, Estimated: 34 mL/min — ABNORMAL LOW (ref >60)
Glucose, Bld: 117 mg/dL — ABNORMAL HIGH (ref 70–99)
Potassium: 3.7 mmol/L (ref 3.5–5.1)
Sodium: 129 mmol/L — ABNORMAL LOW (ref 135–145)

## 2020-11-12 LAB — URINE CULTURE: Culture: >=100000 — AB

## 2020-11-12 LAB — IRON AND TIBC
Iron: 82 ug/dL (ref 45–182)
Saturation Ratios: 44 % — ABNORMAL HIGH (ref 17.9–39.5)
TIBC: 186 ug/dL — ABNORMAL LOW (ref 250–450)
UIBC: 104 ug/dL

## 2020-11-12 LAB — FERRITIN: Ferritin: >1500 ng/mL — ABNORMAL HIGH (ref 24–336)

## 2020-11-12 MED ORDER — CARVEDILOL 6.25 MG PO TABS: 6.2500 mg | ORAL_TABLET | Freq: Two times a day (BID) | ORAL | Status: DC

## 2020-11-12 MED ORDER — CARVEDILOL 3.125 MG PO TABS
3.1250 mg | ORAL_TABLET | Freq: Two times a day (BID) | ORAL | Status: DC
  Administered 2020-11-12: 3.125 mg via ORAL
  Filled 2020-11-12: qty 1

## 2020-11-12 MED ORDER — OXYCODONE-ACETAMINOPHEN 5-325 MG PO TABS: 1.0000 | ORAL_TABLET | Freq: Four times a day (QID) | ORAL | Status: DC | PRN

## 2020-11-12 MED ORDER — DILTIAZEM HCL-DEXTROSE 125-5 MG/125ML-% IV SOLN (PREMIX)
5.0000 mg/h | INTRAVENOUS | Status: DC
  Administered 2020-11-13: 10 mg/h via INTRAVENOUS
  Filled 2020-11-12: qty 125

## 2020-11-12 MED ORDER — CARVEDILOL 3.125 MG PO TABS: 3.1250 mg | ORAL_TABLET | Freq: Two times a day (BID) | ORAL | Status: DC

## 2020-11-12 MED ORDER — TRAMADOL HCL 50 MG PO TABS
50.0000 mg | ORAL_TABLET | Freq: Four times a day (QID) | ORAL | Status: DC | PRN
  Administered 2020-11-12: 50 mg via ORAL
  Filled 2020-11-12: qty 1

## 2020-11-12 MED ORDER — CARVEDILOL 6.25 MG PO TABS
6.2500 mg | ORAL_TABLET | Freq: Two times a day (BID) | ORAL | Status: DC
  Administered 2020-11-12 – 2020-11-13 (×2): 6.25 mg via ORAL
  Filled 2020-11-12 (×2): qty 1

## 2020-11-12 MED ORDER — TRAZODONE HCL 50 MG PO TABS
50.0000 mg | ORAL_TABLET | Freq: Every day | ORAL | Status: DC
  Administered 2020-11-12: 50 mg via ORAL
  Filled 2020-11-12: qty 1

## 2020-11-12 MED ORDER — DILTIAZEM HCL-DEXTROSE 125-5 MG/125ML-% IV SOLN (PREMIX)
5.0000 mg/h | INTRAVENOUS | Status: DC
  Administered 2020-11-12: 5 mg/h via INTRAVENOUS
  Filled 2020-11-12: qty 125

## 2020-11-12 NOTE — Progress Notes (Signed)
PROGRESS NOTE    Hunter [name]  WGY:[id number] DOB: 1946/08/11 DOA: 11/10/2020 PCP: [name] E   Chief Complain: Abnormal blood work  Brief Narrative: Patient is a 74 year old male with history of bladder cancer on gemcitabine/cisplatin, melanoma, coronary artery disease status post CABG in 2013, chronic hyponatremia, hypertension, hyperlipidemia, gout, paroxysmal A. fib not on anticoagulation, recurrent UTIs who was sent from cancer center for direct admission for the evaluation of hyponatremia, AKI.  Patient reported feeling fatigued, dizzy since his most recent chemotherapy, also poor oral intake.  He was noted to have sodium of 118 and creatinine of 2.8 at the cancer center.  On presentation, he was hemodynamically stable, afebrile.  EKG done at the bedside showed A. fib with RVR, his blood pressure was soft.  Patient was admitted for the evaluation and management.  Nephrology, cardiology following.  Assessment & Plan:   Principal Problem:   Hyponatremia Active Problems:   Essential hypertension   CAD s/p CABG   Malignant neoplasm of urinary bladder (HCC)   Acute lower UTI   Acute renal failure superimposed on stage 3a chronic kidney disease (HCC)   Sepsis with acute renal failure and septic shock (HCC)   AKI on CKD stage IIIa: renal US/bladder showed mild left hydronephrosis,left lower pole renal stone.Markedly irregular bladder wall, similar to prior CT. CT abdomen/pelvis on 09/09/2020 showed extensive thickening of the superior urinary bladder wall with multiple small diverticula and extensive fat stranding consistent with bladder malignancy. Patient presumed to be dehydrated on presentation and was started on IV fluids.  AKI could also be from nephrotoxicity from chemotherapy.Now  he is having good urine output. Nephrology following.  Baseline creatinine ranges from 1.3-1.6.  Kidney function is improving.  Acute on chronic hyponatremia: Suspected to be from fluid loss.  .Baseline sodium around low 130s.  Presented with sodium of 118.  Continue IV fluids.  Sodium level is improving  Paroxysmal A. fib: On RVR on presentation and this mrng.  He had history of A. fib several years ago and was on Xarelto which has been discontinued.  He was started on heparin drip during this admission.    Cardiology consulted.CHA2DS2-VASc: 3 .  Patient was complaining of hematuria and there is drop of hemoglobin from 9 to 7  so heparin discontinued for now. Echo showed ejection fraction of 50%. Due to being on RVR this morning, started on diltiazem drip.  Also restarted home Coreg  History of recurrent UTI: History of recurrent staph epidermidis UTI.  UA was suggestive of UTI.  Patient denies any dysuria.  Started on doxycycline.  Check urine culture  Normocytic anemia: Hemoglobin dropped to the range of 7, part of it could be from dilution.  Also could be from hematuria from bladder cancer.  We will continue to monitor hemoglobin.  We will transfuse if hemoglobin drops less than 7  Hypokalemia: Supplemented and monitored  Hypertension: Blood pressure currently stable.  Continue current medications  Bladder cancer: Follows with Dr. Alen Blew.  Recently on cisplatin/gemcitabine.  Complained of some hematuria on 11/11/20 but it has resolved now..  We will continue to monitor.  If hematuria worsens, will order CT scan of the pelvis  History of coronary disease: Status post CABG in 2013.  On aspirin and statin at home.  Generalized weakness: We will request for PT/OT evaluation when appropriate.  Nutrition Problem: Increased nutrient needs Etiology: cancer and cancer related treatments      DVT prophylaxis: Heparin subcu Code Status: None Family Communication: Wife  at bedside on 11/11/20 Status is: Inpatient  Remains inpatient appropriate because:Inpatient level of care appropriate due to severity of illness   Dispo: The patient is from: Home              Anticipated d/c is  to: Home              Anticipated d/c date is: 3 days              Patient currently is not medically stable to d/c.    Consultants: Cardiology, nephrology  Procedures: None  Antimicrobials:  Anti-infectives (From admission, onward)   Start     Dose/Rate Route Frequency Ordered Stop   11/10/20 2200  doxycycline (VIBRA-TABS) tablet 100 mg        100 mg Oral Every 12 hours 11/10/20 1748 11/15/20 2159   11/10/20 1600  vancomycin (VANCOREADY) IVPB 1250 mg/250 mL  Status:  Discontinued        1,250 mg 166.7 mL/hr over 90 Minutes Intravenous Every 48 hours 11/10/20 1524 11/10/20 1717      Subjective: Patient seen and examined the bedside this morning.  Hemodynamically stable but was in A. fib with RVR this morning.  Blood pressure okay.  No complaint of hematuria today.  Complains of some back pain  Objective: Vitals:   11/11/20 0432 11/11/20 1412 11/11/20 2229 11/12/20 0529  BP: 97/62 100/65 101/70 (!) 95/59  Pulse: 100 68 96 94  Resp: 20 20 16 18   Temp: 97.6 F (36.4 C) (!) 97.4 F (36.3 C) 97.6 F (36.4 C) (!) 97.5 F (36.4 C)  TempSrc: Oral Oral Oral Oral  SpO2: 96% 98% 99% 99%  Weight:    83.8 kg  Height:        Intake/Output Summary (Last 24 hours) at 11/12/2020 4888 Last data filed at 11/12/2020 9169 Gross per 24 hour  Intake 1465.59 ml  Output 2375 ml  Net -909.41 ml   Filed Weights   11/10/20 1609 11/12/20 0529  Weight: 80.7 kg 83.8 kg    Examination:   General exam: Overall comfortable HEENT:PERRL,Oral mucosa moist, Ear/Nose normal on gross exam Respiratory system: Bilateral equal air entry, normal vesicular breath sounds, no wheezes or crackles  Cardiovascular system: Irregularly irregular rhythm. No JVD, murmurs, rubs, gallops or clicks.  Chemo-Port on the right chest Gastrointestinal system: Abdomen is nondistended, soft and nontender. No organomegaly or masses felt. Normal bowel sounds heard. Central nervous system: Alert and oriented. No focal  neurological deficits. Extremities: No edema, no clubbing ,no cyanosis Skin: No rashes, lesions or ulcers,no icterus ,no pallor    Data Reviewed: I have personally reviewed following labs and imaging studies  CBC: Recent Labs  Lab 11/05/20 1355 11/10/20 1136 11/11/20 0616 11/11/20 1705 11/12/20 0504  WBC 8.3 2.6* 2.2*  --  4.2  NEUTROABS 7.2 2.2  --   --   --   HGB 12.1* 9.6* 7.7* 7.5* 7.6*  HCT 35.0* 26.6* 21.6* 21.4* 21.8*  MCV 90.4 86.9 88.9  --  90.8  PLT 226 84* 61*  --  56*   Basic Metabolic Panel: Recent Labs  Lab 11/05/20 1355 11/05/20 1355 11/10/20 1136 11/10/20 1630 11/10/20 2125 11/11/20 0616 11/12/20 0504  NA 129*   < > 118* 121* 122* 124* 129*  K 3.8  --  3.2* 4.0  --  3.0* 3.7  CL 91*  --  84* 87*  --  93* 102  CO2 25  --  21* 22  --  21*  20*  GLUCOSE 132*  --  184* 145*  --  162* 117*  BUN 38*  --  62* 63*  --  61* 57*  CREATININE 2.25*  --  2.89* 2.86*  --  2.46* 2.03*  CALCIUM 9.1  --  8.8* 8.3*  --  7.7* 7.9*  MG  --   --   --  2.2  --   --   --    < > = values in this interval not displayed.   GFR: Estimated Creatinine Clearance: 31.9 mL/min (A) (by C-G formula based on SCr of 2.03 mg/dL (H)). Liver Function Tests: Recent Labs  Lab 11/05/20 1355 11/10/20 1136  AST 20 66*  ALT 13 46*  ALKPHOS 83 92  BILITOT 0.8 0.7  PROT 7.2 6.5  ALBUMIN 3.2* 2.4*   No results for input(s): LIPASE, AMYLASE in the last 168 hours. No results for input(s): AMMONIA in the last 168 hours. Coagulation Profile: No results for input(s): INR, PROTIME in the last 168 hours. Cardiac Enzymes: No results for input(s): CKTOTAL, CKMB, CKMBINDEX, TROPONINI in the last 168 hours. BNP (last 3 results) No results for input(s): PROBNP in the last 8760 hours. HbA1C: Recent Labs    11/10/20 2125  HGBA1C 6.3*   CBG: No results for input(s): GLUCAP in the last 168 hours. Lipid Profile: No results for input(s): CHOL, HDL, LDLCALC, TRIG, CHOLHDL, LDLDIRECT in the  last 72 hours. Thyroid Function Tests: Recent Labs    11/11/20 0616  TSH 0.466   Anemia Panel: No results for input(s): VITAMINB12, FOLATE, FERRITIN, TIBC, IRON, RETICCTPCT in the last 72 hours. Sepsis Labs: No results for input(s): PROCALCITON, LATICACIDVEN in the last 168 hours.  Recent Results (from the past 240 hour(s))  Urine Culture     Status: Abnormal   Collection Time: 11/06/20  4:11 PM   Specimen: Urine, Clean Catch  Result Value Ref Range Status   Specimen Description   Final    URINE, CLEAN CATCH Performed at [facility], [address], [place], [place] [address]    Special Requests   Final    NONE Performed at [facility], [place] [address] [place], [place] [address]    Culture 80,000 COLONIES/mL STAPHYLOCOCCUS EPIDERMIDIS (A)  Final   Report Status 11/08/2020 FINAL  Final   Organism ID, Bacteria STAPHYLOCOCCUS EPIDERMIDIS (A)  Final      Susceptibility   Staphylococcus epidermidis - MIC*    CIPROFLOXACIN <=0.5 SENSITIVE Sensitive     GENTAMICIN <=0.5 SENSITIVE Sensitive     NITROFURANTOIN <=16 SENSITIVE Sensitive     OXACILLIN >=4 RESISTANT Resistant     TETRACYCLINE 2 SENSITIVE Sensitive     VANCOMYCIN 2 SENSITIVE Sensitive     TRIMETH/SULFA 160 RESISTANT Resistant     CLINDAMYCIN <=0.25 SENSITIVE Sensitive     RIFAMPIN <=0.5 SENSITIVE Sensitive     Inducible Clindamycin NEGATIVE Sensitive     * 80,000 COLONIES/mL STAPHYLOCOCCUS EPIDERMIDIS  Urine Culture     Status: Abnormal   Collection Time: 11/10/20 11:48 AM   Specimen: Urine, Clean Catch  Result Value Ref Range Status   Specimen Description   Final    URINE, CLEAN CATCH Performed at [facility], [place] [address], [place], [place] [address]    Special Requests   Final    NONE Performed at [facility], [place] [address], [place], [place] [address]    Culture >=100,000 COLONIES/mL STAPHYLOCOCCUS  EPIDERMIDIS (  A)  Final   Report Status 11/12/2020 FINAL  Final   Organism ID, Bacteria STAPHYLOCOCCUS EPIDERMIDIS (A)  Final      Susceptibility   Staphylococcus epidermidis - MIC*    CIPROFLOXACIN <=0.5 SENSITIVE Sensitive     GENTAMICIN <=0.5 SENSITIVE Sensitive     NITROFURANTOIN <=16 SENSITIVE Sensitive     OXACILLIN >=4 RESISTANT Resistant     TETRACYCLINE 2 SENSITIVE Sensitive     VANCOMYCIN 1 SENSITIVE Sensitive     TRIMETH/SULFA 160 RESISTANT Resistant     CLINDAMYCIN <=0.25 SENSITIVE Sensitive     RIFAMPIN <=0.5 SENSITIVE Sensitive     Inducible Clindamycin NEGATIVE Sensitive     * >=100,000 COLONIES/mL STAPHYLOCOCCUS EPIDERMIDIS  Respiratory Panel by RT PCR (Flu A&B, Covid) - Nasopharyngeal Swab     Status: None   Collection Time: 11/10/20  3:48 PM   Specimen: Nasopharyngeal Swab  Result Value Ref Range Status   SARS Coronavirus 2 by RT PCR NEGATIVE NEGATIVE Final    Comment: (NOTE) SARS-CoV-2 target nucleic acids are NOT DETECTED.  The SARS-CoV-2 RNA is generally detectable in upper respiratoy specimens during the acute phase of infection. The lowest concentration of SARS-CoV-2 viral copies this assay can detect is 131 copies/mL. A negative result does not preclude SARS-Cov-2 infection and should not be used as the sole basis for treatment or other patient management decisions. A negative result may occur with  improper specimen collection/handling, submission of specimen other than nasopharyngeal swab, presence of viral mutation(s) within the areas targeted by this assay, and inadequate number of viral copies (<131 copies/mL). A negative result must be combined with clinical observations, patient history, and epidemiological information. The expected result is Negative.  Fact Sheet for Patients:  PinkCheek.be  Fact Sheet for Healthcare Providers:  GravelBags.it  This test is no t yet approved or cleared by  the [place] FDA and  has been authorized for detection and/or diagnosis of SARS-CoV-2 by FDA under an Emergency Use Authorization (EUA). This EUA will remain  in effect (meaning this test can be used) for the duration of the COVID-19 declaration under Section 564(b)(1) of the Act, 21 U.S.C. section 360bbb-3(b)(1), unless the authorization is terminated or revoked sooner.     Influenza A by PCR NEGATIVE NEGATIVE Final   Influenza B by PCR NEGATIVE NEGATIVE Final    Comment: (NOTE) The Xpert Xpress SARS-CoV-2/FLU/RSV assay is intended as an aid in  the diagnosis of influenza from Nasopharyngeal swab specimens and  should not be used as a sole basis for treatment. Nasal washings and  aspirates are unacceptable for Xpert Xpress SARS-CoV-2/FLU/RSV  testing.  Fact Sheet for Patients: PinkCheek.be  Fact Sheet for Healthcare Providers: GravelBags.it  This test is not yet approved or cleared by the [place] FDA and  has been authorized for detection and/or diagnosis of SARS-CoV-2 by  FDA under an Emergency Use Authorization (EUA). This EUA will remain  in effect (meaning this test can be used) for the duration of the  Covid-19 declaration under Section 564(b)(1) of the Act, 21  U.S.C. section 360bbb-3(b)(1), unless the authorization is  terminated or revoked. Performed at [facility], [place] [address], [place], [place] [address]          Radiology Studies: US RENAL  Result Date: 11/10/2020 CLINICAL DATA:  Acute renal failure, chronic kidney disease EXAM: RENAL / URINARY TRACT ULTRASOUND COMPLETE COMPARISON:  CT 09/09/2020 FINDINGS: Right Kidney: Renal measurements: 12.7 x 5.9 x 7.2 cm = volume: 127  mL. Large cysts, the largest in the upper pole measures up to 5.8 cm. Cortical thinning. Normal echotexture. No hydronephrosis. Left Kidney: Renal measurements: 12.8 x 5.5 x 8.2 cm = volume: 229 mL.  Left lower pole stone measures 10 mm. Mild hydronephrosis. Normal echotexture. Mild cortical thinning. Bladder: Irregular bladder wall.  Mildly enlarged prostate. Other: None. IMPRESSION: Mild left hydronephrosis.  Left lower pole renal stone. Right renal cysts appear benign. Markedly irregular bladder wall, similar to prior CT. Prostate enlargement. Electronically Signed   By: Rolm Baptise M.D.   On: 11/10/2020 19:24   ECHOCARDIOGRAM COMPLETE  Result Date: 11/11/2020    ECHOCARDIOGRAM REPORT   Patient Name:   LUIGI STUCKEY Date of Exam: 11/11/2020 Medical Rec #:  [id number]       Height:       69.0 in Accession #:    [id number]      Weight:       178.0 lb Date of Birth:  10-07-1946      BSA:          1.966 m Patient Age:    35 years        BP:           97/62 mmHg Patient Gender: M               HR:           91 bpm. Exam Location:  Inpatient Procedure: 2D Echo, Cardiac Doppler and Color Doppler Indications:    I48.0 Paroxysmal atrial fibrillation  History:        Patient has no prior history of Echocardiogram examinations.                 CAD, Prior CABG; Risk Factors:Hypertension, Dyslipidemia and                 GERD. Cancer.  Sonographer:    Jonelle Sidle Dance Referring Phys: [id number] [place]eb[place]  1. Left ventricular ejection fraction, by estimation, is 50%. The left ventricle has low normal function with beat to beat variability. The left ventricle has no regional wall motion abnormalities. There is mild left ventricular hypertrophy. Left ventricular diastolic parameters are indeterminate.  2. Right ventricular systolic function is normal. The right ventricular size is normal. Tricuspid regurgitation signal is inadequate for assessing PA pressure.  3. The mitral valve is normal in structure. Trivial mitral valve regurgitation. No evidence of mitral stenosis.  4. The aortic valve is normal in structure. Aortic valve regurgitation is not visualized. No aortic stenosis is present. FINDINGS  Left  Ventricle: Left ventricular ejection fraction, by estimation, is 50%. The left ventricle has low normal function. The left ventricle has no regional wall motion abnormalities. The left ventricular internal cavity size was normal in size. There is mild left ventricular hypertrophy. Left ventricular diastolic parameters are indeterminate. Right Ventricle: The right ventricular size is normal. No increase in right ventricular wall thickness. Right ventricular systolic function is normal. Tricuspid regurgitation signal is inadequate for assessing PA pressure. Left Atrium: Left atrial size was normal in size. Right Atrium: Right atrial size was normal in size. Pericardium: There is no evidence of pericardial effusion. Mitral Valve: The mitral valve is normal in structure. Trivial mitral valve regurgitation. No evidence of mitral valve stenosis. Tricuspid Valve: The tricuspid valve is normal in structure. Tricuspid valve regurgitation is trivial. No evidence of tricuspid stenosis. Aortic Valve: The aortic valve is normal in structure. Aortic valve regurgitation is not visualized. No aortic  stenosis is present. Pulmonic Valve: The pulmonic valve was grossly normal. Pulmonic valve regurgitation is trivial. No evidence of pulmonic stenosis. Aorta: The aortic root is normal in size and structure. Venous: The inferior vena cava was not well visualized. IAS/Shunts: No atrial level shunt detected by color flow Doppler.  LEFT VENTRICLE PLAX 2D LVIDd:         4.20 cm LVIDs:         3.30 cm LV PW:         1.50 cm LV IVS:        1.10 cm LVOT diam:     2.30 cm LV SV:         60 LV SV Index:   31 LVOT Area:     4.15 cm  RIGHT VENTRICLE          IVC RV Basal diam:  2.80 cm  IVC diam: 1.10 cm TAPSE (M-mode): 1.4 cm LEFT ATRIUM             Index       RIGHT ATRIUM           Index LA diam:        4.40 cm 2.24 cm/m  RA Area:     18.90 cm LA Vol (A2C):   54.0 ml 27.46 ml/m RA Volume:   50.70 ml  25.79 ml/m LA Vol (A4C):   42.7 ml 21.72  ml/m LA Biplane Vol: 48.7 ml 24.77 ml/m  AORTIC VALVE LVOT Vmax:   70.60 cm/s LVOT Vmean:  53.100 cm/s LVOT VTI:    0.145 m  AORTA Ao Root diam: 3.60 cm Ao Asc diam:  3.40 cm MITRAL VALVE MV Area (PHT): 2.78 cm   SHUNTS MV Decel Time: 273 msec   Systemic VTI:  0.14 m MV E velocity: 0.77 cm/s  Systemic Diam: 2.30 cm Cherlynn Kaiser MD Electronically signed by Cherlynn Kaiser MD Signature Date/Time: 11/11/2020/5:18:06 PM    Final         Scheduled Meds: . Chlorhexidine Gluconate Cloth  6 each Topical Daily  . doxycycline  100 mg Oral Q12H  . feeding supplement  237 mL Oral Q24H  . heparin injection (subcutaneous)  5,000 Units Subcutaneous Q8H  . simvastatin  40 mg Oral q1800  . sodium chloride flush  3 mL Intravenous Q12H   Continuous Infusions: . sodium chloride 85 mL/hr at 11/12/20 0116     LOS: 2 days    Time spent:35 mins, More than 50% of that time was spent in counseling and/or coordination of care.      Shelly Coss, MD [facility] P11/17/2021, 8:22 AM

## 2020-11-12 NOTE — Progress Notes (Signed)
Gave report to Step down Nurse for 1402 for transfer.

## 2020-11-12 NOTE — Progress Notes (Signed)
[facility] Progress Note  Subjective: Na 129 and creat 2.0 today, pt having afib / RVR issues, no CP or SOB. 2 L UOP yest  Vitals:   11/12/20 1213 11/12/20 1311 11/12/20 1318 11/12/20 1339  BP: 110/64  101/77 116/68  Pulse:      Resp: (!) 25 16 18  (!) 23  Temp:      TempSrc:      SpO2: 98%  99% 99%  Weight:      Height:        Exam: Gen alert, R upper chest port, pleasant No jvd or bruits Chest clear bilat to bases RRR no MRG Abd soft ntnd no mass or ascites +bs Ext no leg or UE edema Neuro is alert, Ox 3 , nf    Home meds:  - coreg 6.25 bid/ zocor 40 qd/ maxzide 37.5-25 mg take 1 4d /wk  - mirabegron ER qd/ revatio prn/ compazine prn/ prilosec 20 qd  - zyloprim 33 qd    UA 11/15 - cloudy, 100 prot, many bact, 11-20 rbc, >50 wbc      Baseline creat 1.3- 1.6 from 2020 - Oct 2021, eGFR 46- 51     Last creat on 11/3 1.44, on 11/10 2.25, today 2.89     UNa 22, UCr 55  , Uosm 296     Renal US - 12.7 R kidney, large cysts, cortical thinning, no hydro. L kidney 12.8cm, mild hydro, normal echotexture, mild cort thinning.   Assessment/ Plan: 1. 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 BP's soft on admit, pt was not eating well at home. Suspect AKI due to vol depletion, also poss cisplatin toxicity but less likely. Hx of CKD 3a for many years. Renal US w/ mild cortical thinning and mild L hydro. Peak creat here 2.9 on 11/15, down to 2.4 yesterday and 2.0 today.  Will cont IVF"s x 24 hrs then dc. Should cont to improve. Avoid all NSAID's. Has f/u appt w/ [facility] MD in early December. Will sign off.    2. Hyponatremia - suspect hypovolemic, better w/ 0.9% saline. 129 today.  3. UTI - sp IV vanc/ ceph, now on po doxy 4. HTN - holding coreg and home diuretic, cont to hold w/ soft BP's 5. Bladder Ca - per ONC, primary team 6. CAD hx CABG 7. Atrial fib - per primary, cardiology       Rob Anisten Tomassi 11/12/2020, 2:01 PM   Recent Labs  Lab  11/11/20 0616 11/11/20 0616 11/11/20 1705 11/12/20 0504  K 3.0*  --   --  3.7  BUN 61*  --   --  57*  CREATININE 2.46*  --   --  2.03*  CALCIUM 7.7*  --   --  7.9*  HGB 7.7*   < > 7.5* 7.6*   < > = values in this interval not displayed.   Inpatient medications: . carvedilol  3.125 mg Oral BID WC  . Chlorhexidine Gluconate Cloth  6 each Topical Daily  . doxycycline  100 mg Oral Q12H  . feeding supplement  237 mL Oral Q24H  . heparin injection (subcutaneous)  5,000 Units Subcutaneous Q8H  . simvastatin  40 mg Oral q1800  . sodium chloride flush  3 mL Intravenous Q12H   . sodium chloride 85 mL/hr at 11/12/20 1319  . diltiazem (CARDIZEM) infusion 10 mg/hr (11/12/20 1319)   acetaminophen **OR** acetaminophen, melatonin, Muscle Rub, polyethylene glycol, prochlorperazine, traMADol

## 2020-11-12 NOTE — Progress Notes (Signed)
   11/12/20 1913  Assess: MEWS Score  Temp 99.9 F (37.7 C)  BP 110/66  Pulse Rate 99  ECG Heart Rate (!) 122  Resp (!) 26  SpO2 98 %  O2 Device Room Air  Assess: MEWS Score  MEWS Temp 0  MEWS Systolic 0  MEWS Pulse 2  MEWS RR 2  MEWS LOC 0  MEWS Score 4  MEWS Score Color Red  Assess: if the MEWS score is Yellow or Red  Were vital signs taken at a resting state? Yes  Focused Assessment Change from prior assessment (see assessment flowsheet)  Early Detection of Sepsis Score *See Row Information* Low  MEWS guidelines implemented *See Row Information* Yes  Treat  MEWS Interventions Administered scheduled meds/treatments;Escalated (See documentation below)  Take Vital Signs  Increase Vital Sign Frequency  Red: Q 1hr X 4 then Q 4hr X 4, if remains red, continue Q 4hrs  Escalate  MEWS: Escalate Red: discuss with charge nurse/RN and provider, consider discussing with RRT  Notify: Charge Nurse/RN  Name of Charge Nurse/RN Notified Raynelle Jan RN  Date Charge Nurse/RN Notified 11/12/20  Time Charge Nurse/RN Notified 1855  Notify: Provider  Provider Name/Title Dr Tawanna Solo   Date Provider Notified 11/12/20  Time Provider Notified 1857  Notification Type  (secure chat)  Notification Reason Change in status  Response See new orders  Date of Provider Response 11/12/20  Time of Provider Response 1858

## 2020-11-12 NOTE — Progress Notes (Signed)
Progress Note  Patient Name: Hunter [name] Date of Encounter: 11/12/2020  [facility] Cardiologist: Sanda Klein, MD   Subjective   No awareness of atrial fib, no chest pain or SOB Amiodarone with hallucinations.  Dilt drip started.   Inpatient Medications    Scheduled Meds: . carvedilol  3.125 mg Oral BID WC  . Chlorhexidine Gluconate Cloth  6 each Topical Daily  . doxycycline  100 mg Oral Q12H  . feeding supplement  237 mL Oral Q24H  . heparin injection (subcutaneous)  5,000 Units Subcutaneous Q8H  . simvastatin  40 mg Oral q1800  . sodium chloride flush  3 mL Intravenous Q12H   Continuous Infusions: . sodium chloride 85 mL/hr at 11/12/20 0116  . diltiazem (CARDIZEM) infusion 5 mg/hr (11/12/20 1133)   PRN Meds: acetaminophen **OR** acetaminophen, melatonin, Muscle Rub, polyethylene glycol, prochlorperazine, traMADol   Vital Signs    Vitals:   11/11/20 2229 11/12/20 0529 11/12/20 0800 11/12/20 1056  BP: 101/70 (!) 95/59 (!) 110/42 117/64  Pulse: 96 94 (!) 121 94  Resp: 16 18 18  (!) 22  Temp: 97.6 F (36.4 C) (!) 97.5 F (36.4 C) 97.9 F (36.6 C) 98.7 F (37.1 C)  TempSrc: Oral Oral Oral Oral  SpO2: 99% 99% 99% 99%  Weight:  83.8 kg    Height:        Intake/Output Summary (Last 24 hours) at 11/12/2020 1136 Last data filed at 11/12/2020 1000 Gross per 24 hour  Intake 1465.59 ml  Output 2325 ml  Net -859.41 ml   Last 3 Weights 11/12/2020 11/10/2020 11/10/2020  Weight (lbs) 184 lb 11.9 oz 178 lb 177 lb  Weight (kg) 83.8 kg 80.74 kg 80.287 kg      Telemetry    A fib with RVR  - Personally Reviewed  ECG    No new  - Personally Reviewed  Physical Exam   GEN: No acute distress.   Neck: No JVD Cardiac: irreg irreg, no murmurs, rubs, or gallops.  Respiratory: Clear to auscultation bilaterally. GI: Soft, nontender, non-distended  MS: No edema; No deformity. Neuro:  Nonfocal  Psych: Normal affect   Labs    High Sensitivity Troponin:   No results for input(s): TROPONINIHS in the last 720 hours.    Chemistry Recent Labs  Lab 11/05/20 1355 11/05/20 1355 11/10/20 1136 11/10/20 1136 11/10/20 1630 11/10/20 1630 11/10/20 2125 11/11/20 0616 11/12/20 0504  NA 129*   < > 118*   < > 121*   < > 122* 124* 129*  K 3.8   < > 3.2*   < > 4.0  --   --  3.0* 3.7  CL 91*   < > 84*   < > 87*  --   --  93* 102  CO2 25   < > 21*   < > 22  --   --  21* 20*  GLUCOSE 132*   < > 184*   < > 145*  --   --  162* 117*  BUN 38*   < > 62*   < > 63*  --   --  61* 57*  CREATININE 2.25*   < > 2.89*  --  2.86*  --   --  2.46* 2.03*  CALCIUM 9.1   < > 8.8*   < > 8.3*  --   --  7.7* 7.9*  PROT 7.2  --  6.5  --   --   --   --   --   --  ALBUMIN 3.2*  --  2.4*  --   --   --   --   --   --   AST 20  --  66*  --   --   --   --   --   --   ALT 13  --  46*  --   --   --   --   --   --   ALKPHOS 83  --  92  --   --   --   --   --   --   BILITOT 0.8  --  0.7  --   --   --   --   --   --   GFRNONAA 30*   < > 22*  --  22*  --   --  27* 34*  ANIONGAP 13   < > 13   < > 12  --   --  10 7   < > = values in this interval not displayed.     Hematology Recent Labs  Lab 11/10/20 1136 11/10/20 1136 11/11/20 0616 11/11/20 1705 11/12/20 0504  WBC 2.6*  --  2.2*  --  4.2  RBC 3.06*  --  2.43*  --  2.40*  HGB 9.6*  --  7.7* 7.5* 7.6*  HCT 26.6*   < > 21.6* 21.4* 21.8*  MCV 86.9  --  88.9  --  90.8  MCH 31.4  --  31.7  --  31.7  MCHC 36.1*  --  35.6  --  34.9  RDW 15.2  --  15.5  --  15.6*  PLT 84*  --  61*  --  56*   < > = values in this interval not displayed.    BNPNo results for input(s): BNP, PROBNP in the last 168 hours.   DDimer No results for input(s): DDIMER in the last 168 hours.   Radiology    US RENAL  Result Date: 11/10/2020 CLINICAL DATA:  Acute renal failure, chronic kidney disease EXAM: RENAL / URINARY TRACT ULTRASOUND COMPLETE COMPARISON:  CT 09/09/2020 FINDINGS: Right Kidney: Renal measurements: 12.7 x 5.9 x 7.2 cm = volume: 127  mL. Large cysts, the largest in the upper pole measures up to 5.8 cm. Cortical thinning. Normal echotexture. No hydronephrosis. Left Kidney: Renal measurements: 12.8 x 5.5 x 8.2 cm = volume: 229 mL. Left lower pole stone measures 10 mm. Mild hydronephrosis. Normal echotexture. Mild cortical thinning. Bladder: Irregular bladder wall.  Mildly enlarged prostate. Other: None. IMPRESSION: Mild left hydronephrosis.  Left lower pole renal stone. Right renal cysts appear benign. Markedly irregular bladder wall, similar to prior CT. Prostate enlargement. Electronically Signed   By: Rolm Baptise M.D.   On: 11/10/2020 19:24   ECHOCARDIOGRAM COMPLETE  Result Date: 11/11/2020    ECHOCARDIOGRAM REPORT   Patient Name:   Hunter [name] Date of Exam: 11/11/2020 Medical Rec #:  [id number]       Height:       69.0 in Accession #:    [id number]      Weight:       178.0 lb Date of Birth:  09-28-1946      BSA:          1.966 m Patient Age:    74 years        BP:           97/62 mmHg Patient Gender: M  HR:           91 bpm. Exam Location:  Inpatient Procedure: 2D Echo, Cardiac Doppler and Color Doppler Indications:    I48.0 Paroxysmal atrial fibrillation  History:        Patient has no prior history of Echocardiogram examinations.                 CAD, Prior CABG; Risk Factors:Hypertension, Dyslipidemia and                 GERD. Cancer.  Sonographer:    Jonelle Sidle Dance Referring Phys: [id number] [place]  1. Left ventricular ejection fraction, by estimation, is 50%. The left ventricle has low normal function with beat to beat variability. The left ventricle has no regional wall motion abnormalities. There is mild left ventricular hypertrophy. Left ventricular diastolic parameters are indeterminate.  2. Right ventricular systolic function is normal. The right ventricular size is normal. Tricuspid regurgitation signal is inadequate for assessing PA pressure.  3. The mitral valve is normal in structure. Trivial  mitral valve regurgitation. No evidence of mitral stenosis.  4. The aortic valve is normal in structure. Aortic valve regurgitation is not visualized. No aortic stenosis is present. FINDINGS  Left Ventricle: Left ventricular ejection fraction, by estimation, is 50%. The left ventricle has low normal function. The left ventricle has no regional wall motion abnormalities. The left ventricular internal cavity size was normal in size. There is mild left ventricular hypertrophy. Left ventricular diastolic parameters are indeterminate. Right Ventricle: The right ventricular size is normal. No increase in right ventricular wall thickness. Right ventricular systolic function is normal. Tricuspid regurgitation signal is inadequate for assessing PA pressure. Left Atrium: Left atrial size was normal in size. Right Atrium: Right atrial size was normal in size. Pericardium: There is no evidence of pericardial effusion. Mitral Valve: The mitral valve is normal in structure. Trivial mitral valve regurgitation. No evidence of mitral valve stenosis. Tricuspid Valve: The tricuspid valve is normal in structure. Tricuspid valve regurgitation is trivial. No evidence of tricuspid stenosis. Aortic Valve: The aortic valve is normal in structure. Aortic valve regurgitation is not visualized. No aortic stenosis is present. Pulmonic Valve: The pulmonic valve was grossly normal. Pulmonic valve regurgitation is trivial. No evidence of pulmonic stenosis. Aorta: The aortic root is normal in size and structure. Venous: The inferior vena cava was not well visualized. IAS/Shunts: No atrial level shunt detected by color flow Doppler.  LEFT VENTRICLE PLAX 2D LVIDd:         4.20 cm LVIDs:         3.30 cm LV PW:         1.50 cm LV IVS:        1.10 cm LVOT diam:     2.30 cm LV SV:         60 LV SV Index:   31 LVOT Area:     4.15 cm  RIGHT VENTRICLE          IVC RV Basal diam:  2.80 cm  IVC diam: 1.10 cm TAPSE (M-mode): 1.4 cm LEFT ATRIUM              Index       RIGHT ATRIUM           Index LA diam:        4.40 cm 2.24 cm/m  RA Area:     18.90 cm LA Vol (A2C):   54.0 ml 27.46 ml/m RA Volume:  50.70 ml  25.79 ml/m LA Vol (A4C):   42.7 ml 21.72 ml/m LA Biplane Vol: 48.7 ml 24.77 ml/m  AORTIC VALVE LVOT Vmax:   70.60 cm/s LVOT Vmean:  53.100 cm/s LVOT VTI:    0.145 m  AORTA Ao Root diam: 3.60 cm Ao Asc diam:  3.40 cm MITRAL VALVE MV Area (PHT): 2.78 cm   SHUNTS MV Decel Time: 273 msec   Systemic VTI:  0.14 m MV E velocity: 0.77 cm/s  Systemic Diam: 2.30 cm Cherlynn Kaiser MD Electronically signed by Cherlynn Kaiser MD Signature Date/Time: 11/11/2020/5:18:06 PM    Final     Cardiac Studies   Echo 11/11/20 IMPRESSIONS    1. Left ventricular ejection fraction, by estimation, is 50%. The left  ventricle has low normal function with beat to beat variability. The left  ventricle has no regional wall motion abnormalities. There is mild left  ventricular hypertrophy. Left  ventricular diastolic parameters are indeterminate.  2. Right ventricular systolic function is normal. The right ventricular  size is normal. Tricuspid regurgitation signal is inadequate for assessing  PA pressure.  3. The mitral valve is normal in structure. Trivial mitral valve  regurgitation. No evidence of mitral stenosis.  4. The aortic valve is normal in structure. Aortic valve regurgitation is  not visualized. No aortic stenosis is present.   FINDINGS  Left Ventricle: Left ventricular ejection fraction, by estimation, is  50%. The left ventricle has low normal function. The left ventricle has no  regional wall motion abnormalities. The left ventricular internal cavity  size was normal in size. There is  mild left ventricular hypertrophy. Left ventricular diastolic parameters  are indeterminate.   Right Ventricle: The right ventricular size is normal. No increase in  right ventricular wall thickness. Right ventricular systolic function is  normal.  Tricuspid regurgitation signal is inadequate for assessing PA  pressure.   Left Atrium: Left atrial size was normal in size.   Right Atrium: Right atrial size was normal in size.   Pericardium: There is no evidence of pericardial effusion.   Mitral Valve: The mitral valve is normal in structure. Trivial mitral  valve regurgitation. No evidence of mitral valve stenosis.   Tricuspid Valve: The tricuspid valve is normal in structure. Tricuspid  valve regurgitation is trivial. No evidence of tricuspid stenosis.   Aortic Valve: The aortic valve is normal in structure. Aortic valve  regurgitation is not visualized. No aortic stenosis is present.   Pulmonic Valve: The pulmonic valve was grossly normal. Pulmonic valve  regurgitation is trivial. No evidence of pulmonic stenosis.   Aorta: The aortic root is normal in size and structure.   Venous: The inferior vena cava was not well visualized.   IAS/Shunts: No atrial level shunt detected by color flow Doppler.   Patient Profile     74 y.o. 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  Assessment & Plan   1. Persistent Atrial fibrillation, RVR -Heart rate had improved with hydration. But now back to 120s 130s  -SBP 90s-100s, so carvedilol has been held. Though now BP improved.  On dilt drip will increase to 10 mg keeping systolic BP > 458.  May need to resume coreg.  -He was on heparin for  anticoagulation but with drop in Hgb it was stopped.  - CHA2DS2-VASc Score = 3  indicating a 3.2% annual risk of stroke.  The patient's score is based upon: CHF History: 0 HTN History: 1 Diabetes History: 0 Stroke History: 0 Vascular Disease History: 1 Age Score: 1 Gender Score: 0 -Eliquis  when able  2.  Dehydration, hyponatremia, hypokalemia, acute kidney injury -His potassium aggressively supplemented,today K+ 3.7  -He is being hydrated, his sodium is improving as is his creatinine  He is neg 2509  -Per IM  3.  Acute UTI -He is on antibiotics per IM, urine culture has been reintubated for better growth, and blood cultures are pending -If blood cultures come back positive, may need TEE -Urine culture 11/11 showed 80,000 colonies staph epidermidis with minimal antibiotic resistance -10/20 urine culture showed greater than 100,000 colonies of staph epidermidis -per IM      For questions or updates, please contact [facility] Please consult [email or web address] for contact info under        Signed, Cecilie Kicks, NP  11/12/2020, 11:36 AM

## 2020-11-12 NOTE — Plan of Care (Signed)

## 2020-11-12 NOTE — Progress Notes (Signed)
These preliminary result these preliminary results were noted.  Awaiting final report.

## 2020-11-12 NOTE — Significant Event (Signed)
Rapid Response Event Note   Reason for Call : Tachycardia, Atrial Fib. Notified by [facility] staff that patient was having tachycardia periodically. Asked [facility] staff to obtain a 12-Lead EKG. Upon arrival to patient's room, patient is alert and oriented x4. Wife at bedside. Patient able to follow commands. Patient asymptomatic with fast heart rate. Central telemetry shows rhythm and 12-Lead confirmed Afib RVR. MD Adhikari notified and ordered to administer cardizem gtt.    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 Patient having pain in right lower back, MD is aware per patient. Warming pad applied to patient's area of pain.  Cardiac:  Atrial Fib HR 110s-160s, BP see vital signs, pulses 1+ in radial and pedal locations bilaterally  Pulmonary: No respiratory distress, patient's breath sounds clear in all lung fields, O2 Sats 99-100% on RA.  Interventions:  12-lead EKG obtained Initiated and maintaining cardizem gtt with appropriate titrations per parameters   Plan of Care:  Transfer to 4th floor progressive when bed available. Will be monitoring patient until then.   Event Summary:   MD Notified: MD Adhikari notified at 1054 Call Time: 1030 for Rapid Response Call Arrival Time: Rapid Response at 1035 End Time: Ongoing until transfer   Laurence Slate, RN

## 2020-11-13 DIAGNOSIS — I10 Essential (primary) hypertension: Secondary | ICD-10-CM | POA: Diagnosis not present

## 2020-11-13 DIAGNOSIS — E871 Hypo-osmolality and hyponatremia: Secondary | ICD-10-CM | POA: Diagnosis not present

## 2020-11-13 DIAGNOSIS — I480 Paroxysmal atrial fibrillation: Secondary | ICD-10-CM | POA: Diagnosis not present

## 2020-11-13 DIAGNOSIS — I2510 Atherosclerotic heart disease of native coronary artery without angina pectoris: Secondary | ICD-10-CM | POA: Diagnosis not present

## 2020-11-13 LAB — BASIC METABOLIC PANEL
Anion gap: 10 (ref 5–15)
BUN: 43 mg/dL — ABNORMAL HIGH (ref 8–23)
CO2: 20 mmol/L — ABNORMAL LOW (ref 22–32)
Calcium: 7.8 mg/dL — ABNORMAL LOW (ref 8.9–10.3)
Chloride: 99 mmol/L (ref 98–111)
Creatinine, Ser: 1.88 mg/dL — ABNORMAL HIGH (ref 0.61–1.24)
GFR, Estimated: 37 mL/min — ABNORMAL LOW (ref >60)
Glucose, Bld: 106 mg/dL — ABNORMAL HIGH (ref 70–99)
Potassium: 3.4 mmol/L — ABNORMAL LOW (ref 3.5–5.1)
Sodium: 129 mmol/L — ABNORMAL LOW (ref 135–145)

## 2020-11-13 LAB — CBC
HCT: 21.2 % — ABNORMAL LOW (ref 39.0–52.0)
Hemoglobin: 7.4 g/dL — ABNORMAL LOW (ref 13.0–17.0)
MCH: 32 pg (ref 26.0–34.0)
MCHC: 34.9 g/dL (ref 30.0–36.0)
MCV: 91.8 fL (ref 80.0–100.0)
Platelets: 69 10*3/uL — ABNORMAL LOW (ref 150–400)
RBC: 2.31 MIL/uL — ABNORMAL LOW (ref 4.22–5.81)
RDW: 16 % — ABNORMAL HIGH (ref 11.5–15.5)
WBC: 5.9 10*3/uL (ref 4.0–10.5)
nRBC: 0.5 % — ABNORMAL HIGH (ref 0.0–0.2)

## 2020-11-13 LAB — URINE CULTURE

## 2020-11-13 MED ORDER — TAMSULOSIN HCL 0.4 MG PO CAPS
0.4000 mg | ORAL_CAPSULE | Freq: Every day | ORAL | Status: DC
  Administered 2020-11-13 – 2020-11-15 (×3): 0.4 mg via ORAL
  Filled 2020-11-13 (×3): qty 1

## 2020-11-13 MED ORDER — POTASSIUM CHLORIDE CRYS ER 20 MEQ PO TBCR
40.0000 meq | EXTENDED_RELEASE_TABLET | Freq: Once | ORAL | Status: AC
  Administered 2020-11-13: 40 meq via ORAL
  Filled 2020-11-13: qty 2

## 2020-11-13 MED ORDER — SODIUM CHLORIDE 0.9% FLUSH: 10.0000 mL | INTRAVENOUS | Status: DC | PRN

## 2020-11-13 MED ORDER — ATORVASTATIN CALCIUM 20 MG PO TABS
20.0000 mg | ORAL_TABLET | Freq: Every day | ORAL | Status: DC
  Administered 2020-11-13 – 2020-11-14 (×2): 20 mg via ORAL
  Filled 2020-11-13 (×2): qty 1

## 2020-11-13 MED ORDER — METOPROLOL TARTRATE 25 MG PO TABS
25.0000 mg | ORAL_TABLET | Freq: Two times a day (BID) | ORAL | Status: DC
  Administered 2020-11-13 – 2020-11-14 (×2): 25 mg via ORAL
  Filled 2020-11-13 (×2): qty 1

## 2020-11-13 MED ORDER — MEGESTROL ACETATE 40 MG PO TABS
40.0000 mg | ORAL_TABLET | Freq: Every day | ORAL | Status: DC
  Administered 2020-11-13 – 2020-11-14 (×2): 40 mg via ORAL
  Filled 2020-11-13 (×2): qty 1

## 2020-11-13 MED ORDER — DILTIAZEM HCL 60 MG PO TABS
60.0000 mg | ORAL_TABLET | Freq: Three times a day (TID) | ORAL | Status: DC
  Administered 2020-11-13 – 2020-11-14 (×3): 60 mg via ORAL
  Filled 2020-11-13 (×3): qty 1

## 2020-11-13 NOTE — Progress Notes (Signed)
PROGRESS NOTE    Hunter [name]  NWG:[id number] DOB: 01/21/1946 DOA: 11/10/2020 PCP: [name] E   Chief Complain: Abnormal blood work  Brief Narrative: Patient is a 74 year old male with history of bladder cancer on gemcitabine/cisplatin, melanoma, coronary artery disease status post CABG in 2013, chronic hyponatremia, hypertension, hyperlipidemia, gout, paroxysmal A. fib not on anticoagulation, recurrent UTIs who was sent from cancer center for direct admission for the evaluation of hyponatremia, AKI.  Patient reported feeling fatigued, dizzy since his most recent chemotherapy, also poor oral intake.  He was noted to have sodium of 118 and creatinine of 2.8 at the cancer center.  On presentation, he was hemodynamically stable, afebrile.  EKG done at the bedside showed A. fib with RVR, his blood pressure was soft.  Patient was admitted for further evaluation and management.  Nephrology, cardiology following.  Assessment & Plan:   Principal Problem:   Hyponatremia Active Problems:   Essential hypertension   CAD s/p CABG   Paroxysmal atrial fibrillation (HCC)   Malignant neoplasm of urinary bladder (HCC)   Acute lower UTI   Acute renal failure superimposed on stage 3a chronic kidney disease (HCC)   Sepsis with acute renal failure and septic shock (HCC)   AKI on CKD stage IIIa: renal US/bladder showed mild left hydronephrosis,left lower pole renal stone.Markedly irregular bladder wall, similar to prior CT. CT abdomen/pelvis on 09/09/2020 showed extensive thickening of the superior urinary bladder wall with multiple small diverticula and extensive fat stranding consistent with bladder malignancy. Patient presumed to be dehydrated on presentation and was started on IV fluids.  AKI could also be from nephrotoxicity from chemotherapy.Now  he is having good urine output. Nephrology following.  Baseline creatinine ranges from 1.3-1.6.  Kidney function is improving and near baseline.  IV  fluids discontinued  Acute on chronic hyponatremia: Suspected to be from fluid loss. .Baseline sodium around low 130s.  Presented with sodium of 118.  Now near baseline  Paroxysmal A. fib: On RVR on presentation and this mrng.  He had history of A. fib several years ago and was on Xarelto which has been discontinued.  He was started on heparin drip during this admission.    Cardiology consulted.CHA2DS2-VASc: 3 .  Patient was complaining of hematuria and there is drop of hemoglobin from 9 to 7  so heparin discontinued for now. Echo showed ejection fraction of 50%. Hospital course remarkable for A. fib with RVR, started on diltiazem drip.  Now on oral Cardizem and metoprolol  History of recurrent UTI: History of recurrent staph epidermidis UTI.  UA was suggestive of UTI.  Patient denies any dysuria.  Started on doxycycline.  Pending urine culture  Normocytic anemia: Hemoglobin dropped to the range of 7, part of it could be from dilution.  Also could be from hematuria from bladder cancer.  We will continue to monitor hemoglobin.  We will transfuse if hemoglobin drops less than 7  Hypokalemia: Supplemented and monitored  Hypertension: Blood pressure currently stable.  Continue current medications  Bladder cancer: Follows with Dr. Alen Blew.  Recently on cisplatin/gemcitabine.  Complained of some hematuria on 11/11/20 but it has resolved now..  We will continue to monitor.    History of coronary artery disease: Status post CABG in 2013.  On aspirin and statin at home.  Generalized weakness: We will request for PT/OT evaluation  Decreased oral intake: Started on Megace.  Nutrition consulted  Nutrition Problem: Increased nutrient needs Etiology: cancer and cancer related treatments  DVT prophylaxis: Heparin subcu Code Status: None Family Communication: Wife at bedside on 11/13/20 Status is: Inpatient  Remains inpatient appropriate because:Inpatient level of care appropriate due to  severity of illness   Dispo: The patient is from: Home              Anticipated d/c is to: Home              Anticipated d/c date is: 1-2 days              Patient currently is not medically stable to d/c.    Consultants: Cardiology, nephrology  Procedures: None  Antimicrobials:  Anti-infectives (From admission, onward)   Start     Dose/Rate Route Frequency Ordered Stop   11/10/20 2200  doxycycline (VIBRA-TABS) tablet 100 mg        100 mg Oral Every 12 hours 11/10/20 1748 11/15/20 2159   11/10/20 1600  vancomycin (VANCOREADY) IVPB 1250 mg/250 mL  Status:  Discontinued        1,250 mg 166.7 mL/hr over 90 Minutes Intravenous Every 48 hours 11/10/20 1524 11/10/20 1717      Subjective:  Patient seen and examined at the bedside this morning.  Hemodynamically stable during my evaluation.  Blood pressure soft but stable.  Heart rate better controlled.  Denies any complaints   Objective: Vitals:   11/13/20 0129 11/13/20 0234 11/13/20 0312 11/13/20 0500  BP: 113/67 (!) 108/57 113/69   Pulse: (!) 102 90 92   Resp: 20 (!) 24 16   Temp:      TempSrc:      SpO2: 97% 96% 96%   Weight:    84.5 kg  Height:        Intake/Output Summary (Last 24 hours) at 11/13/2020 0807 Last data filed at 11/13/2020 0600 Gross per 24 hour  Intake 2498.83 ml  Output 1100 ml  Net 1398.83 ml   Filed Weights   11/10/20 1609 11/12/20 0529 11/13/20 0500  Weight: 80.7 kg 83.8 kg 84.5 kg    Examination:    General exam: Generalized weakness  Respiratory system: Bilateral equal air entry, normal vesicular breath sounds, no wheezes or crackles  Cardiovascular system: Irregularly irregular rhythm. No JVD, murmurs, rubs, gallops or clicks.  Chemo-Port on the right chest Gastrointestinal system: Abdomen is nondistended, soft and nontender. No organomegaly or masses felt. Normal bowel sounds heard. Central nervous system: Alert and oriented. No focal neurological deficits. Extremities: No edema, no  clubbing ,no cyanosis Skin: No rashes, lesions or ulcers,no icterus ,no pallor     Data Reviewed: I have personally reviewed following labs and imaging studies  CBC: Recent Labs  Lab 11/10/20 1136 11/11/20 0616 11/11/20 1705 11/12/20 0504 11/13/20 0548  WBC 2.6* 2.2*  --  4.2 5.9  NEUTROABS 2.2  --   --   --   --   HGB 9.6* 7.7* 7.5* 7.6* 7.4*  HCT 26.6* 21.6* 21.4* 21.8* 21.2*  MCV 86.9 88.9  --  90.8 91.8  PLT 84* 61*  --  56* 69*   Basic Metabolic Panel: Recent Labs  Lab 11/10/20 1136 11/10/20 1136 11/10/20 1630 11/10/20 2125 11/11/20 0616 11/12/20 0504 11/13/20 0548  NA 118*   < > 121* 122* 124* 129* 129*  K 3.2*  --  4.0  --  3.0* 3.7 3.4*  CL 84*  --  87*  --  93* 102 99  CO2 21*  --  22  --  21* 20* 20*  GLUCOSE 184*  --  145*  --  162* 117* 106*  BUN 62*  --  63*  --  61* 57* 43*  CREATININE 2.89*  --  2.86*  --  2.46* 2.03* 1.88*  CALCIUM 8.8*  --  8.3*  --  7.7* 7.9* 7.8*  MG  --   --  2.2  --   --   --   --    < > = values in this interval not displayed.   GFR: Estimated Creatinine Clearance: 34.5 mL/min (A) (by C-G formula based on SCr of 1.88 mg/dL (H)). Liver Function Tests: Recent Labs  Lab 11/10/20 1136  AST 66*  ALT 46*  ALKPHOS 92  BILITOT 0.7  PROT 6.5  ALBUMIN 2.4*   No results for input(s): LIPASE, AMYLASE in the last 168 hours. No results for input(s): AMMONIA in the last 168 hours. Coagulation Profile: No results for input(s): INR, PROTIME in the last 168 hours. Cardiac Enzymes: No results for input(s): CKTOTAL, CKMB, CKMBINDEX, TROPONINI in the last 168 hours. BNP (last 3 results) No results for input(s): PROBNP in the last 8760 hours. HbA1C: Recent Labs    11/10/20 2125  HGBA1C 6.3*   CBG: No results for input(s): GLUCAP in the last 168 hours. Lipid Profile: No results for input(s): CHOL, HDL, LDLCALC, TRIG, CHOLHDL, LDLDIRECT in the last 72 hours. Thyroid Function Tests: Recent Labs    11/11/20 0616  TSH 0.466    Anemia Panel: Recent Labs    11/12/20 0504  FERRITIN >1,500*  TIBC 186*  IRON 82   Sepsis Labs: No results for input(s): PROCALCITON, LATICACIDVEN in the last 168 hours.  Recent Results (from the past 240 hour(s))  Urine Culture     Status: Abnormal   Collection Time: 11/06/20  4:11 PM   Specimen: Urine, Clean Catch  Result Value Ref Range Status   Specimen Description   Final    URINE, CLEAN CATCH Performed at [facility], [address], [place], [place] [address]    Special Requests   Final    NONE Performed at [facility], [place] [address], [place], [place] [address]    Culture 80,000 COLONIES/mL STAPHYLOCOCCUS EPIDERMIDIS (A)  Final   Report Status 11/08/2020 FINAL  Final   Organism ID, Bacteria STAPHYLOCOCCUS EPIDERMIDIS (A)  Final      Susceptibility   Staphylococcus epidermidis - MIC*    CIPROFLOXACIN <=0.5 SENSITIVE Sensitive     GENTAMICIN <=0.5 SENSITIVE Sensitive     NITROFURANTOIN <=16 SENSITIVE Sensitive     OXACILLIN >=4 RESISTANT Resistant     TETRACYCLINE 2 SENSITIVE Sensitive     VANCOMYCIN 2 SENSITIVE Sensitive     TRIMETH/SULFA 160 RESISTANT Resistant     CLINDAMYCIN <=0.25 SENSITIVE Sensitive     RIFAMPIN <=0.5 SENSITIVE Sensitive     Inducible Clindamycin NEGATIVE Sensitive     * 80,000 COLONIES/mL STAPHYLOCOCCUS EPIDERMIDIS  Urine Culture     Status: Abnormal   Collection Time: 11/10/20 11:48 AM   Specimen: Urine, Clean Catch  Result Value Ref Range Status   Specimen Description   Final    URINE, CLEAN CATCH Performed at [facility], [place] [address] [place], [place] [address]    Special Requests   Final    NONE Performed at [facility], [place] [address], [place], [place] [address]    Culture >=100,000 COLONIES/mL STAPHYLOCOCCUS EPIDERMIDIS (A)  Final   Report Status 11/12/2020 FINAL  Final  Organism ID, Bacteria STAPHYLOCOCCUS  EPIDERMIDIS (A)  Final      Susceptibility   Staphylococcus epidermidis - MIC*    CIPROFLOXACIN <=0.5 SENSITIVE Sensitive     GENTAMICIN <=0.5 SENSITIVE Sensitive     NITROFURANTOIN <=16 SENSITIVE Sensitive     OXACILLIN >=4 RESISTANT Resistant     TETRACYCLINE 2 SENSITIVE Sensitive     VANCOMYCIN 1 SENSITIVE Sensitive     TRIMETH/SULFA 160 RESISTANT Resistant     CLINDAMYCIN <=0.25 SENSITIVE Sensitive     RIFAMPIN <=0.5 SENSITIVE Sensitive     Inducible Clindamycin NEGATIVE Sensitive     * >=100,000 COLONIES/mL STAPHYLOCOCCUS EPIDERMIDIS  Respiratory Panel by RT PCR (Flu A&B, Covid) - Nasopharyngeal Swab     Status: None   Collection Time: 11/10/20  3:48 PM   Specimen: Nasopharyngeal Swab  Result Value Ref Range Status   SARS Coronavirus 2 by RT PCR NEGATIVE NEGATIVE Final    Comment: (NOTE) SARS-CoV-2 target nucleic acids are NOT DETECTED.  The SARS-CoV-2 RNA is generally detectable in upper respiratoy specimens during the acute phase of infection. The lowest concentration of SARS-CoV-2 viral copies this assay can detect is 131 copies/mL. A negative result does not preclude SARS-Cov-2 infection and should not be used as the sole basis for treatment or other patient management decisions. A negative result may occur with  improper specimen collection/handling, submission of specimen other than nasopharyngeal swab, presence of viral mutation(s) within the areas targeted by this assay, and inadequate number of viral copies (<131 copies/mL). A negative result must be combined with clinical observations, patient history, and epidemiological information. The expected result is Negative.  Fact Sheet for Patients:  PinkCheek.be  Fact Sheet for Healthcare Providers:  GravelBags.it  This test is no t yet approved or cleared by the [place] FDA and  has been authorized for detection and/or diagnosis of SARS-CoV-2 by FDA  under an Emergency Use Authorization (EUA). This EUA will remain  in effect (meaning this test can be used) for the duration of the COVID-19 declaration under Section 564(b)(1) of the Act, 21 U.S.C. section 360bbb-3(b)(1), unless the authorization is terminated or revoked sooner.     Influenza A by PCR NEGATIVE NEGATIVE Final   Influenza B by PCR NEGATIVE NEGATIVE Final    Comment: (NOTE) The Xpert Xpress SARS-CoV-2/FLU/RSV assay is intended as an aid in  the diagnosis of influenza from Nasopharyngeal swab specimens and  should not be used as a sole basis for treatment. Nasal washings and  aspirates are unacceptable for Xpert Xpress SARS-CoV-2/FLU/RSV  testing.  Fact Sheet for Patients: PinkCheek.be  Fact Sheet for Healthcare Providers: GravelBags.it  This test is not yet approved or cleared by the [place] FDA and  has been authorized for detection and/or diagnosis of SARS-CoV-2 by  FDA under an Emergency Use Authorization (EUA). This EUA will remain  in effect (meaning this test can be used) for the duration of the  Covid-19 declaration under Section 564(b)(1) of the Act, 21  U.S.C. section 360bbb-3(b)(1), unless the authorization is  terminated or revoked. Performed at [facility], [place] [address] [place], [place] [address]          Radiology Studies: ECHOCARDIOGRAM COMPLETE  Result Date: 11/11/2020    ECHOCARDIOGRAM REPORT   Patient Name:   BRANDOM KERWIN Date of Exam: 11/11/2020 Medical Rec #:  [id number]       Height:       69.0 in Accession #:    [id number]  Weight:       178.0 lb Date of Birth:  April 19, 1946      BSA:          1.966 m Patient Age:    70 years        BP:           97/62 mmHg Patient Gender: M               HR:           91 bpm. Exam Location:  Inpatient Procedure: 2D Echo, Cardiac Doppler and Color Doppler Indications:    I48.0 Paroxysmal atrial fibrillation  History:         Patient has no prior history of Echocardiogram examinations.                 CAD, Prior CABG; Risk Factors:Hypertension, Dyslipidemia and                 GERD. Cancer.  Sonographer:    Jonelle Sidle Dance Referring Phys: [id number] [place]  1. Left ventricular ejection fraction, by estimation, is 50%. The left ventricle has low normal function with beat to beat variability. The left ventricle has no regional wall motion abnormalities. There is mild left ventricular hypertrophy. Left ventricular diastolic parameters are indeterminate.  2. Right ventricular systolic function is normal. The right ventricular size is normal. Tricuspid regurgitation signal is inadequate for assessing PA pressure.  3. The mitral valve is normal in structure. Trivial mitral valve regurgitation. No evidence of mitral stenosis.  4. The aortic valve is normal in structure. Aortic valve regurgitation is not visualized. No aortic stenosis is present. FINDINGS  Left Ventricle: Left ventricular ejection fraction, by estimation, is 50%. The left ventricle has low normal function. The left ventricle has no regional wall motion abnormalities. The left ventricular internal cavity size was normal in size. There is mild left ventricular hypertrophy. Left ventricular diastolic parameters are indeterminate. Right Ventricle: The right ventricular size is normal. No increase in right ventricular wall thickness. Right ventricular systolic function is normal. Tricuspid regurgitation signal is inadequate for assessing PA pressure. Left Atrium: Left atrial size was normal in size. Right Atrium: Right atrial size was normal in size. Pericardium: There is no evidence of pericardial effusion. Mitral Valve: The mitral valve is normal in structure. Trivial mitral valve regurgitation. No evidence of mitral valve stenosis. Tricuspid Valve: The tricuspid valve is normal in structure. Tricuspid valve regurgitation is trivial. No evidence of tricuspid  stenosis. Aortic Valve: The aortic valve is normal in structure. Aortic valve regurgitation is not visualized. No aortic stenosis is present. Pulmonic Valve: The pulmonic valve was grossly normal. Pulmonic valve regurgitation is trivial. No evidence of pulmonic stenosis. Aorta: The aortic root is normal in size and structure. Venous: The inferior vena cava was not well visualized. IAS/Shunts: No atrial level shunt detected by color flow Doppler.  LEFT VENTRICLE PLAX 2D LVIDd:         4.20 cm LVIDs:         3.30 cm LV PW:         1.50 cm LV IVS:        1.10 cm LVOT diam:     2.30 cm LV SV:         60 LV SV Index:   31 LVOT Area:     4.15 cm  RIGHT VENTRICLE          IVC RV Basal diam:  2.80 cm  IVC diam: 1.10 cm TAPSE (M-mode): 1.4 cm LEFT ATRIUM             Index       RIGHT ATRIUM           Index LA diam:        4.40 cm 2.24 cm/m  RA Area:     18.90 cm LA Vol (A2C):   54.0 ml 27.46 ml/m RA Volume:   50.70 ml  25.79 ml/m LA Vol (A4C):   42.7 ml 21.72 ml/m LA Biplane Vol: 48.7 ml 24.77 ml/m  AORTIC VALVE LVOT Vmax:   70.60 cm/s LVOT Vmean:  53.100 cm/s LVOT VTI:    0.145 m  AORTA Ao Root diam: 3.60 cm Ao Asc diam:  3.40 cm MITRAL VALVE MV Area (PHT): 2.78 cm   SHUNTS MV Decel Time: 273 msec   Systemic VTI:  0.14 m MV E velocity: 0.77 cm/s  Systemic Diam: 2.30 cm Cherlynn Kaiser MD Electronically signed by Cherlynn Kaiser MD Signature Date/Time: 11/11/2020/5:18:06 PM    Final         Scheduled Meds: . carvedilol  6.25 mg Oral BID WC  . Chlorhexidine Gluconate Cloth  6 each Topical Daily  . doxycycline  100 mg Oral Q12H  . feeding supplement  237 mL Oral Q24H  . heparin injection (subcutaneous)  5,000 Units Subcutaneous Q8H  . simvastatin  40 mg Oral q1800  . sodium chloride flush  3 mL Intravenous Q12H  . traZODone  50 mg Oral QHS   Continuous Infusions: . sodium chloride 75 mL/hr at 11/13/20 0016  . diltiazem (CARDIZEM) infusion 7.5 mg/hr (11/13/20 0235)     LOS: 3 days    Time  spent:35 mins, More than 50% of that time was spent in counseling and/or coordination of care.      Shelly Coss, MD [facility] P11/18/2021, 8:07 AM

## 2020-11-13 NOTE — Progress Notes (Signed)
Progress Note  Patient Name: Hunter [name] Date of Encounter: 11/13/2020  [facility] Cardiologist: Sanda Klein, MD   Subjective   Drowsy but wakes and follows commands  Inpatient Medications    Scheduled Meds:  carvedilol  6.25 mg Oral BID WC   Chlorhexidine Gluconate Cloth  6 each Topical Daily   doxycycline  100 mg Oral Q12H   feeding supplement  237 mL Oral Q24H   heparin injection (subcutaneous)  5,000 Units Subcutaneous Q8H   potassium chloride  40 mEq Oral Once   simvastatin  40 mg Oral q1800   sodium chloride flush  3 mL Intravenous Q12H   traZODone  50 mg Oral QHS   Continuous Infusions:  sodium chloride 75 mL/hr at 11/13/20 0016   PRN Meds: acetaminophen **OR** acetaminophen, melatonin, Muscle Rub, polyethylene glycol, prochlorperazine, sodium chloride flush, traMADol   Vital Signs    Vitals:   11/13/20 0312 11/13/20 0500 11/13/20 0800 11/13/20 0820  BP: 113/69  105/72 105/72  Pulse: 92  91 91  Resp: 16   16  Temp:    98.9 F (37.2 C)  TempSrc:    Oral  SpO2: 96%  98% 98%  Weight:  84.5 kg    Height:        Intake/Output Summary (Last 24 hours) at 11/13/2020 0959 Last data filed at 11/13/2020 0820 Gross per 24 hour  Intake 2498.83 ml  Output 1225 ml  Net 1273.83 ml   Last 3 Weights 11/13/2020 11/12/2020 11/10/2020  Weight (lbs) 186 lb 4.6 oz 184 lb 11.9 oz 178 lb  Weight (kg) 84.5 kg 83.8 kg 80.74 kg      Telemetry    In and out of a fib RVR now a fib rate controlled - Personally Reviewed  ECG    A fib RVR  - Personally Reviewed  Physical Exam   GEN: No acute distress.   Neck: No JVD Cardiac: irreg irrreg, no murmurs, rubs, or gallops.  Respiratory: Clear to auscultation bilaterally. GI: Soft, nontender, non-distended  MS: No edema; No deformity. Neuro:  Nonfocal  Psych: Normal affect   Labs    High Sensitivity Troponin:  No results for input(s): TROPONINIHS in the last 720 hours.    Chemistry Recent Labs   Lab 11/10/20 1136 11/10/20 1630 11/11/20 0616 11/12/20 0504 11/13/20 0548  NA 118*   < > 124* 129* 129*  K 3.2*   < > 3.0* 3.7 3.4*  CL 84*   < > 93* 102 99  CO2 21*   < > 21* 20* 20*  GLUCOSE 184*   < > 162* 117* 106*  BUN 62*   < > 61* 57* 43*  CREATININE 2.89*   < > 2.46* 2.03* 1.88*  CALCIUM 8.8*   < > 7.7* 7.9* 7.8*  PROT 6.5  --   --   --   --   ALBUMIN 2.4*  --   --   --   --   AST 66*  --   --   --   --   ALT 46*  --   --   --   --   ALKPHOS 92  --   --   --   --   BILITOT 0.7  --   --   --   --   GFRNONAA 22*   < > 27* 34* 37*  ANIONGAP 13   < > 10 7 10    < > = values in this interval not  displayed.     Hematology Recent Labs  Lab 11/11/20 0616 11/11/20 0616 11/11/20 1705 11/12/20 0504 11/13/20 0548  WBC 2.2*  --   --  4.2 5.9  RBC 2.43*  --   --  2.40* 2.31*  HGB 7.7*   < > 7.5* 7.6* 7.4*  HCT 21.6*   < > 21.4* 21.8* 21.2*  MCV 88.9  --   --  90.8 91.8  MCH 31.7  --   --  31.7 32.0  MCHC 35.6  --   --  34.9 34.9  RDW 15.5  --   --  15.6* 16.0*  PLT 61*  --   --  56* 69*   < > = values in this interval not displayed.    BNPNo results for input(s): BNP, PROBNP in the last 168 hours.   DDimer No results for input(s): DDIMER in the last 168 hours.   Radiology    ECHOCARDIOGRAM COMPLETE  Result Date: 11/11/2020    ECHOCARDIOGRAM REPORT   Patient Name:   Hunter [name] Date of Exam: 11/11/2020 Medical Rec #:  [id number]       Height:       69.0 in Accession #:    [id number]      Weight:       178.0 lb Date of Birth:  11/30/1946      BSA:          1.966 m Patient Age:    34 years        BP:           97/62 mmHg Patient Gender: M               HR:           91 bpm. Exam Location:  Inpatient Procedure: 2D Echo, Cardiac Doppler and Color Doppler Indications:    I48.0 Paroxysmal atrial fibrillation  History:        Patient has no prior history of Echocardiogram examinations.                 CAD, Prior CABG; Risk Factors:Hypertension, Dyslipidemia and                  GERD. Cancer.  Sonographer:    Jonelle Sidle Dance Referring Phys: [id number] [place]  1. Left ventricular ejection fraction, by estimation, is 50%. The left ventricle has low normal function with beat to beat variability. The left ventricle has no regional wall motion abnormalities. There is mild left ventricular hypertrophy. Left ventricular diastolic parameters are indeterminate.  2. Right ventricular systolic function is normal. The right ventricular size is normal. Tricuspid regurgitation signal is inadequate for assessing PA pressure.  3. The mitral valve is normal in structure. Trivial mitral valve regurgitation. No evidence of mitral stenosis.  4. The aortic valve is normal in structure. Aortic valve regurgitation is not visualized. No aortic stenosis is present. FINDINGS  Left Ventricle: Left ventricular ejection fraction, by estimation, is 50%. The left ventricle has low normal function. The left ventricle has no regional wall motion abnormalities. The left ventricular internal cavity size was normal in size. There is mild left ventricular hypertrophy. Left ventricular diastolic parameters are indeterminate. Right Ventricle: The right ventricular size is normal. No increase in right ventricular wall thickness. Right ventricular systolic function is normal. Tricuspid regurgitation signal is inadequate for assessing PA pressure. Left Atrium: Left atrial size was normal in size. Right Atrium: Right atrial size was normal in size. Pericardium: There  is no evidence of pericardial effusion. Mitral Valve: The mitral valve is normal in structure. Trivial mitral valve regurgitation. No evidence of mitral valve stenosis. Tricuspid Valve: The tricuspid valve is normal in structure. Tricuspid valve regurgitation is trivial. No evidence of tricuspid stenosis. Aortic Valve: The aortic valve is normal in structure. Aortic valve regurgitation is not visualized. No aortic stenosis is present. Pulmonic Valve:  The pulmonic valve was grossly normal. Pulmonic valve regurgitation is trivial. No evidence of pulmonic stenosis. Aorta: The aortic root is normal in size and structure. Venous: The inferior vena cava was not well visualized. IAS/Shunts: No atrial level shunt detected by color flow Doppler.  LEFT VENTRICLE PLAX 2D LVIDd:         4.20 cm LVIDs:         3.30 cm LV PW:         1.50 cm LV IVS:        1.10 cm LVOT diam:     2.30 cm LV SV:         60 LV SV Index:   31 LVOT Area:     4.15 cm  RIGHT VENTRICLE          IVC RV Basal diam:  2.80 cm  IVC diam: 1.10 cm TAPSE (M-mode): 1.4 cm LEFT ATRIUM             Index       RIGHT ATRIUM           Index LA diam:        4.40 cm 2.24 cm/m  RA Area:     18.90 cm LA Vol (A2C):   54.0 ml 27.46 ml/m RA Volume:   50.70 ml  25.79 ml/m LA Vol (A4C):   42.7 ml 21.72 ml/m LA Biplane Vol: 48.7 ml 24.77 ml/m  AORTIC VALVE LVOT Vmax:   70.60 cm/s LVOT Vmean:  53.100 cm/s LVOT VTI:    0.145 m  AORTA Ao Root diam: 3.60 cm Ao Asc diam:  3.40 cm MITRAL VALVE MV Area (PHT): 2.78 cm   SHUNTS MV Decel Time: 273 msec   Systemic VTI:  0.14 m MV E velocity: 0.77 cm/s  Systemic Diam: 2.30 cm Cherlynn Kaiser MD Electronically signed by Cherlynn Kaiser MD Signature Date/Time: 11/11/2020/5:18:06 PM    Final     Cardiac Studies   Echo 11/11/20 IMPRESSIONS    1. Left ventricular ejection fraction, by estimation, is 50%. The left  ventricle has low normal function with beat to beat variability. The left  ventricle has no regional wall motion abnormalities. There is mild left  ventricular hypertrophy. Left  ventricular diastolic parameters are indeterminate.  2. Right ventricular systolic function is normal. The right ventricular  size is normal. Tricuspid regurgitation signal is inadequate for assessing  PA pressure.  3. The mitral valve is normal in structure. Trivial mitral valve  regurgitation. No evidence of mitral stenosis.  4. The aortic valve is normal in structure.  Aortic valve regurgitation is  not visualized. No aortic stenosis is present.   FINDINGS  Left Ventricle: Left ventricular ejection fraction, by estimation, is  50%. The left ventricle has low normal function. The left ventricle has no  regional wall motion abnormalities. The left ventricular internal cavity  size was normal in size. There is  mild left ventricular hypertrophy. Left ventricular diastolic parameters  are indeterminate.   Right Ventricle: The right ventricular size is normal. No increase in  right ventricular wall thickness. Right ventricular systolic  function is  normal. Tricuspid regurgitation signal is inadequate for assessing PA  pressure.   Left Atrium: Left atrial size was normal in size.   Right Atrium: Right atrial size was normal in size.   Pericardium: There is no evidence of pericardial effusion.   Mitral Valve: The mitral valve is normal in structure. Trivial mitral  valve regurgitation. No evidence of mitral valve stenosis.   Tricuspid Valve: The tricuspid valve is normal in structure. Tricuspid  valve regurgitation is trivial. No evidence of tricuspid stenosis.   Aortic Valve: The aortic valve is normal in structure. Aortic valve  regurgitation is not visualized. No aortic stenosis is present.   Pulmonic Valve: The pulmonic valve was grossly normal. Pulmonic valve  regurgitation is trivial. No evidence of pulmonic stenosis.   Aorta: The aortic root is normal in size and structure.   Venous: The inferior vena cava was not well visualized.   IAS/Shunts: No atrial level shunt detected by color flow Doppler.   Patient Profile     74 y.o. male with a hx of CABG x4 (LIMA-LAD; VG-OM1, OM2, VG-PD, 2013),complicated by post-op Afib (previously on amio(intolerant), sotalol, now onCoreg),intolerant of metoprolol with fatigue,HTN, HLD, CKD stage III, and GERD, now admitted with recurrent UTIs, Na of 118, AKI with Cr 2.8 and A fib with RVR  Assessment  & Plan    1. PersistentAtrial fibrillation, RVR -Heart rate had improved with hydration. But now back to 120s 130s he had converted at one point.  With continued increased HR pt moved to 4E at [facility]  Has been on and off IV dilt just stopped at 0700 today -is back on coreg at 6.25 BID    BP is 105/72  -He was on heparin for anticoagulation but with drop in Hgb it was stopped.  -CHA2DS2-VAScScore = 3indicating a3.2% annual risk of stroke. -Eliquis when able  2.Dehydration, hyponatremia, hypokalemia, acute kidney injury -His potassium aggressively supplemented,today K+ 3.4  -He is being hydrated, his sodium is improving as is his creatinine  He is neg 1135 since admit and wt is up 4 Kg.  -Per IM and renal  3. Acute UTI -He is on antibiotics per IM, urine culture has been reintubated for better growth, and blood cultures are pending -If blood cultures come back positive, may need TEE -Urine culture 11/11 showed 80,000 colonies staph epidermidis with minimal antibiotic resistance -10/20 urine culture showed greater than 100,000 colonies of staph epidermidis -per IM     For questions or updates, please contact [facility] Please consult [email or web address] for contact info under        Signed, Cecilie Kicks, NP  11/13/2020, 9:59 AM

## 2020-11-13 NOTE — Progress Notes (Signed)
The order for simvastatin(Zocor) was changed to an equivalent dose of atorvastatin(Lipitor) due to the potential drug interaction with diltiazem.  When taken in combination with medications that inhibit its metabolism, simvastatin can accumulate which increases the risk of liver toxicity, myopathy, or rhabdomyolysis.  Simvastatin dose should not exceed 10mg /day in patients taking verapamil, diltiazem, fibrates, or niacin >or= 1g/day.   Simvastatin dose should not exceed 20mg /day in patients taking amlodipine, ranolazine or amiodarone.   Please consider this potential interaction at discharge.  Barbette Mcglaun A 11/13/2020 3:59 PM

## 2020-11-13 NOTE — Evaluation (Signed)
Occupational Therapy Evaluation Patient Details Name: Hunter [name] MRN: [id number] DOB: 05/14/1946 Today's Date: 11/13/2020    History of Present Illness Patient is a 74 year old male PMH of bladder cancer on gemcitabine and cisplatin, melanoma, CAD s/p CABG 2013, chronic hyponatremia, hypertension, hyperlipidemia, gout, atrial fibrillation, recurrent staph epi UTIs who was a direct admission from the cancer center on 11/15 for hyponatremia.   Clinical Impression   Patient lives with spouse in multi-level home 7 steps up to bedroom and is independent at baseline. Reports decline in health since a chemo treatment in October and a new onset within past few weeks of R hip pain and R LE weakness. OT note some crepitus in R hip with standing at EOB. Patient also reporting back pain therefore instructed in log roll to get in/out of bed. Patient min A OOB and mod A to lift LEs back onto bed. Patient min A with rolling walker and cues for hand placement to power up to standing from EOB and take few steps to [facility]. Patient HR up to 160 in standing therefore further activity deferred and RN made aware of HR + newer onset of R LE weakness/pain. Recommend continued acute OT services in order to maximize patient activity tolerance, strength, balance, safety in order to facilitate to venue listed below.    Follow Up Recommendations  Home health OT;Supervision/Assistance - 24 hour    Equipment Recommendations  Other (comment);3 in 1 bedside commode (rolling walker)       Precautions / Restrictions Precautions Precautions: Fall Precaution Comments: monitor HR Restrictions Weight Bearing Restrictions: No      Mobility Bed Mobility Overal bed mobility: Needs Assistance Bed Mobility: Rolling;Sidelying to Sit;Sit to Sidelying Rolling: Min assist Sidelying to sit: Min assist     Sit to sidelying: Mod assist General bed mobility comments: min A to position R LE for log roll, min A to elevate trunk to  sit upright and mod A to manage LEs onto bed     Transfers Overall transfer level: Needs assistance Equipment used: Rolling walker (2 wheeled) Transfers: Sit to/from Stand Sit to Stand: Min assist         General transfer comment: please see ADL section- toilet transfer    Balance Overall balance assessment: Needs assistance Sitting-balance support: Feet supported Sitting balance-Leahy Scale: Fair     Standing balance support: Bilateral upper extremity supported Standing balance-Leahy Scale: Poor Standing balance comment: reliant on external assistance                            ADL either performed or assessed with clinical judgement   ADL Overall ADL's : Needs assistance/impaired     Grooming: Set up;Sitting   Upper Body Bathing: Sitting;Minimal assistance   Lower Body Bathing: Moderate assistance;Sitting/lateral leans;Sit to/from stand   Upper Body Dressing : Minimal assistance;Sitting   Lower Body Dressing: Moderate assistance;Sitting/lateral leans;Sit to/from stand   Toilet Transfer: Minimal Insurance claims handler Details (indicate cue type and reason): simulated with side stepping to HOB, min A to safely power up to standing and verbal cues for hand placement not to pull on walker.  Toileting- Clothing Manipulation and Hygiene: Minimal assistance;Sitting/lateral lean;Sit to/from stand       Functional mobility during ADLs: Minimal assistance;Rolling walker General ADL Comments: patient requiring increased assistance for self care due to decreased activity tolerance, balance, safety awareness, and pain in back/R hip  Pertinent Vitals/Pain Pain Assessment: Faces Faces Pain Scale: Hurts little more Pain Location: R hip with mob Pain Descriptors / Indicators: Sharp Pain Intervention(s): Monitored during session;Other (comment) (RN made aware)     Hand Dominance Right   Extremity/Trunk Assessment Upper Extremity  Assessment Upper Extremity Assessment: LUE deficits/detail LUE Deficits / Details: hx of polio with very limited strength/functional use   Lower Extremity Assessment Lower Extremity Assessment: Defer to PT evaluation       Communication Communication Communication: No difficulties   Cognition Arousal/Alertness: Awake/alert Behavior During Therapy: WFL for tasks assessed/performed Overall Cognitive Status: Within Functional Limits for tasks assessed                                     General Comments  at rest HR fluctuating between 90-120s, seated EOB 100-brief 130s, in standing patient HR elevate to 160 therefore returned to bed where patient recover back to 90-110s. RN made aware.    Exercises Exercises: General Lower Extremity General Exercises - Lower Extremity Ankle Circles/Pumps: AROM;Both;10 reps;Supine Quad Sets: Strengthening;Both;10 reps;Supine Gluteal Sets: Strengthening;Both;10 reps;Supine        Home Living Family/patient expects to be discharged to:: Private residence Living Arrangements: Spouse/significant other Available Help at Discharge: Family Type of Home: House Home Access: Stairs to enter Technical brewer of Steps: 1   Home Layout: Multi-level Alternate Level Stairs-Number of Steps: 7 Alternate Level Stairs-Rails: Right Bathroom Shower/Tub: Occupational psychologist: Standard     Home Equipment: Shower seat - built in   Additional Comments: patient can stay on main level once dressed in AM, only goes upstairs to sleep      Prior Functioning/Environment Level of Independence: Independent                 OT Problem List: Decreased activity tolerance;Impaired balance (sitting and/or standing);Decreased safety awareness;Decreased knowledge of use of DME or AE;Pain      OT Treatment/Interventions: Self-care/ADL training;Therapeutic exercise;Energy conservation;DME and/or AE instruction;Therapeutic  activities;Patient/family education;Balance training    OT Goals(Current goals can be found in the care plan section) Acute Rehab OT Goals Patient Stated Goal: go home OT Goal Formulation: With patient Time For Goal Achievement: 11/27/20 Potential to Achieve Goals: Good  OT Frequency: Min 2X/week    AM-PAC OT "6 Clicks" Daily Activity     Outcome Measure Help from another person eating meals?: None Help from another person taking care of personal grooming?: A Little Help from another person toileting, which includes using toliet, bedpan, or urinal?: A Little Help from another person bathing (including washing, rinsing, drying)?: A Lot Help from another person to put on and taking off regular upper body clothing?: A Little Help from another person to put on and taking off regular lower body clothing?: A Lot 6 Click Score: 17   End of Session Equipment Utilized During Treatment: Rolling walker Nurse Communication: Mobility status;Other (comment) (HR and pain in R hip)  Activity Tolerance: Treatment limited secondary to medical complications (Comment) (A fib) Patient left: in bed;with call bell/phone within reach;with family/visitor present  OT Visit Diagnosis: Other abnormalities of gait and mobility (R26.89);Pain Pain - Right/Left: Right Pain - part of body: Hip                Time: 1610-9604 OT Time Calculation (min): 29 min Charges:  OT General Charges $OT Visit: 1 Visit OT Evaluation $OT Eval Moderate Complexity: 1  Mod OT Treatments $Self Care/Home Management : 8-22 mins  Delbert Phenix OT OT pager: [phone]  Rosemary Holms 11/13/2020, 3:20 PM

## 2020-11-13 NOTE — Care Management Important Message (Signed)
Important Message  Patient Details IM Letter given to the Patient. Name: Hunter [name] MRN: [id number] Date of Birth: 1946/12/13   Medicare Important Message Given:  Yes     Kerin Salen 11/13/2020, 12:06 PM

## 2020-11-14 ENCOUNTER — Inpatient Hospital Stay (HOSPITAL_COMMUNITY): Payer: Medicare Other

## 2020-11-14 DIAGNOSIS — E871 Hypo-osmolality and hyponatremia: Secondary | ICD-10-CM | POA: Diagnosis not present

## 2020-11-14 DIAGNOSIS — I480 Paroxysmal atrial fibrillation: Secondary | ICD-10-CM | POA: Diagnosis not present

## 2020-11-14 DIAGNOSIS — I2510 Atherosclerotic heart disease of native coronary artery without angina pectoris: Secondary | ICD-10-CM | POA: Diagnosis not present

## 2020-11-14 DIAGNOSIS — I10 Essential (primary) hypertension: Secondary | ICD-10-CM | POA: Diagnosis not present

## 2020-11-14 LAB — BASIC METABOLIC PANEL
Anion gap: 8 (ref 5–15)
BUN: 35 mg/dL — ABNORMAL HIGH (ref 8–23)
CO2: 21 mmol/L — ABNORMAL LOW (ref 22–32)
Calcium: 7.8 mg/dL — ABNORMAL LOW (ref 8.9–10.3)
Chloride: 102 mmol/L (ref 98–111)
Creatinine, Ser: 1.6 mg/dL — ABNORMAL HIGH (ref 0.61–1.24)
GFR, Estimated: 45 mL/min — ABNORMAL LOW (ref >60)
Glucose, Bld: 113 mg/dL — ABNORMAL HIGH (ref 70–99)
Potassium: 3.7 mmol/L (ref 3.5–5.1)
Sodium: 131 mmol/L — ABNORMAL LOW (ref 135–145)

## 2020-11-14 LAB — CBC
HCT: 20.6 % — ABNORMAL LOW (ref 39.0–52.0)
Hemoglobin: 7.2 g/dL — ABNORMAL LOW (ref 13.0–17.0)
MCH: 32.1 pg (ref 26.0–34.0)
MCHC: 35 g/dL (ref 30.0–36.0)
MCV: 92 fL (ref 80.0–100.0)
Platelets: 101 10*3/uL — ABNORMAL LOW (ref 150–400)
RBC: 2.24 MIL/uL — ABNORMAL LOW (ref 4.22–5.81)
RDW: 16.3 % — ABNORMAL HIGH (ref 11.5–15.5)
WBC: 7 10*3/uL (ref 4.0–10.5)
nRBC: 0.9 % — ABNORMAL HIGH (ref 0.0–0.2)

## 2020-11-14 LAB — PREPARE RBC (CROSSMATCH)

## 2020-11-14 MED ORDER — DILTIAZEM HCL ER COATED BEADS 120 MG PO CP24
120.0000 mg | ORAL_CAPSULE | Freq: Every day | ORAL | Status: DC
  Administered 2020-11-14: 120 mg via ORAL
  Filled 2020-11-14: qty 1

## 2020-11-14 MED ORDER — ENSURE ENLIVE PO LIQD
237.0000 mL | Freq: Two times a day (BID) | ORAL | Status: DC
  Administered 2020-11-14: 237 mL via ORAL

## 2020-11-14 MED ORDER — METOPROLOL TARTRATE 25 MG PO TABS: 25.0000 mg | ORAL_TABLET | Freq: Once | ORAL | Status: DC

## 2020-11-14 MED ORDER — MEGESTROL ACETATE 400 MG/10ML PO SUSP
400.0000 mg | Freq: Every day | ORAL | Status: DC
  Administered 2020-11-15: 400 mg via ORAL
  Filled 2020-11-14: qty 10

## 2020-11-14 MED ORDER — SODIUM CHLORIDE 0.9 % IV BOLUS
500.0000 mL | Freq: Once | INTRAVENOUS | Status: AC
  Administered 2020-11-15: 500 mL via INTRAVENOUS

## 2020-11-14 MED ORDER — SODIUM CHLORIDE 0.9% IV SOLUTION: Freq: Once | INTRAVENOUS | Status: DC

## 2020-11-14 MED ORDER — METOPROLOL TARTRATE 50 MG PO TABS
50.0000 mg | ORAL_TABLET | Freq: Two times a day (BID) | ORAL | Status: DC
  Administered 2020-11-14 – 2020-11-15 (×2): 50 mg via ORAL
  Filled 2020-11-14 (×2): qty 1

## 2020-11-14 NOTE — TOC Initial Note (Signed)
Transition of Care [facility]) - Initial/Assessment Note    Patient Details  Name: Hunter [name] MRN: [id number] Date of Birth: 02-Aug-1946  Transition of Care [facility]) CM/SW Contact:    Dessa Phi, RN Phone Number: 11/14/2020, 2:26 PM  Clinical Narrative: spoke to spouse about d/c plan-if HHC needed-[employer] [facility] [place] aware of HHPT/OT orders & d/c in am;spouse-states they have a rw,if 3n1 needed she will purchase on own. No further CM needs.                  Expected Discharge Plan: [place] Barriers to Discharge: Continued Medical Work up   Patient Goals and CMS Choice Patient states their goals for this hospitalization and ongoing recovery are:: go home CMS Medicare.gov Compare Post Acute Care list provided to:: Patient Represenative (must comment) Choice offered to / list presented to : Spouse  Expected Discharge Plan and Services Expected Discharge Plan: [place]   Discharge Planning Services: CM Consult Post Acute Care Choice: [place] arrangements for the past 2 months: [place]: PT, OT [place] Agency: [place] Date [place]: 11/14/20 Time [place]: 65 Representative spoke with at [place]: [place] Arrangements/Services Living arrangements for the past 2 months: [place] Lives with:: Spouse Patient language and need for interpreter reviewed:: Yes Do you feel safe going back to the place where you live?: Yes      Need for Family Participation in Patient Care: No (Comment) Care giver support system in place?: Yes (comment) Current home services: DME (rw,3n1-will get if needed.) Criminal Activity/Legal Involvement Pertinent to Current Situation/Hospitalization: No - Comment as needed  Activities of Daily Living Home Assistive Devices/Equipment: None ADL Screening (condition at time of  admission) Patient's cognitive ability adequate to safely complete daily activities?: Yes Is the patient deaf or have difficulty hearing?: No Does the patient have difficulty seeing, even when wearing glasses/contacts?: No Does the patient have difficulty concentrating, remembering, or making decisions?: No Patient able to express need for assistance with ADLs?: Yes Does the patient have difficulty dressing or bathing?: No Independently performs ADLs?: Yes (appropriate for developmental age) Does the patient have difficulty walking or climbing stairs?: No Weakness of Legs: Both Weakness of Arms/Hands: None  Permission Sought/Granted Permission sought to share information with : Case Manager Permission granted to share information with : Yes, Verbal Permission Granted  Share Information with NAME: Case manager     Permission granted to share info w Relationship: susan spouse [phone]     Emotional Assessment Appearance:: Appears stated age Attitude/Demeanor/Rapport: Gracious Affect (typically observed): Accepting Orientation: : Oriented to Self, Oriented to Place, Oriented to  Time, Oriented to Situation Alcohol / Substance Use: Not Applicable Psych Involvement: No (comment)  Admission diagnosis:  Hyponatremia [E87.1] Patient Active Problem List   Diagnosis Date Noted  . Sepsis with acute renal failure and septic shock ([place])   . Hyponatremia 11/10/2020  . Acute lower UTI 11/10/2020  . Acute renal failure superimposed on stage 3a chronic kidney disease ([place]) 11/10/2020  . Port-A-Cath in place 09/23/2020  . Malignant neoplasm of urinary bladder ([place]) 09/03/2020  . Goals of care, counseling/discussion 09/03/2020  . Chronic anticoagulation, Xarelto 12/20/2012  . NSVT (nonsustained ventricular tachycardia), 9 beats 12/23 12/18/2012  .  Abnormal nuclear cardiac imaging test as an OP 12/16/2012  . Polio 12/16/2012  . Angina effort 12/15/2012  . CAD s/p CABG 12/15/2012  . S/P  CABG x 4, 12/12/12, LIMA-LAD;VG-OM1,OM2;VG-PD 12/15/2012  . Paroxysmal atrial fibrillation ([place]) 12/15/2012  . Hyperlipidemia 10/26/2010  . GOUT 10/26/2010  . MONONEURITIS OF UNSPECIFIED SITE 10/26/2010  . Essential hypertension 10/26/2010  . GERD 10/26/2010  . NEPHROLITHIASIS, HX OF 10/26/2010   PCP:  [name] E Pharmacy:   [id number] - [place], [address] [place] [address] Phone: [phone] Fax: [phone]     Social Determinants of Health (SDOH) Interventions    Readmission Risk Interventions No flowsheet data found.

## 2020-11-14 NOTE — Progress Notes (Signed)
Nutrition Follow-up  RD working remotely.  DOCUMENTATION CODES:   Not applicable  INTERVENTION:  - continue Ensure Enlive but will increase from once/day to BID. - continue Magic Cup BID.  - will complete NFPE at follow-up.   NUTRITION DIAGNOSIS:   Increased nutrient needs related to cancer and cancer related treatments as evidenced by estimated needs. -ongoing  GOAL:   Patient will meet greater than or equal to 90% of their needs -met on average  MONITOR:   Supplement acceptance, PO intake, Labs, Weight trends, I & O's  REASON FOR ASSESSMENT:   Consult Assessment of nutrition requirement/status  ASSESSMENT:   74 year old male with past medical history of bladder cancer on gemcitabine and cisplatin, melanoma, CAD s/p CABG in 2013, chronic hyponatremia, hypertension, hyperlipidemia, gout, atrial fibrillation not on anticoagulation, recurrent staph epi UTIs who was a direct admission from the cancer center today for abnormal lab work.  RD working off campus today. Patient is noted to be out of the room to Diagnostic Radiology. Since 11/15, he has been eating 50-100% at meals and accepting Ensure 100% of the time offered.   Weight has been stable since 11/17 but is +3.3 kg/7 lb compared to admission (11/15) weight. Non-pitting edema to BLE documented in the flow sheet.   Per notes: - AKI with mild L hydronephrosis and L-sided renal stone - acute on chronic hyponatremia thought to be 2/2 fluid loss--now near baseline - bladder cancer - from home and plan for home at time of d/c, possibly in 1-2 days    Labs reviewed; Na: 131 mmol/l, BUN: 35 mg/dl, creatinine: 1.6 mg/dl, Ca: 7.8 mg/dl, GFR: 45 ml/min.  Medications reviewed; 400 mg megace/day started 11/20, 40 mEq Klor-Con x1 dose 11/18.    NUTRITION - FOCUSED PHYSICAL EXAM:  unable to complete at this time.   Diet Order:   Diet Order            Diet Heart Room service appropriate? Yes; Fluid consistency: Thin  Diet  effective now                 EDUCATION NEEDS:   No education needs have been identified at this time  Skin:  Skin Assessment: Reviewed RN Assessment  Last BM:  11/16  Height:   Ht Readings from Last 1 Encounters:  11/10/20 5' 9"  (1.753 m)    Weight:   Wt Readings from Last 1 Encounters:  11/14/20 84 kg     Estimated Nutritional Needs:  Kcal:  2200-2400 Protein:  115-125g Fluid:  2.2L/day      Jarome Matin, MS, RD, LDN, CNSC Inpatient Clinical Dietitian RD pager # available in [facility]  After hours/weekend pager # available in [facility]

## 2020-11-14 NOTE — Progress Notes (Signed)
PROGRESS NOTE    Hunter [name]  FXJ:[id number] DOB: 06/13/1946 DOA: 11/10/2020 PCP: [name] E   Chief Complain: Abnormal blood work  Brief Narrative: Patient is a 74 year old male with history of bladder cancer on gemcitabine/cisplatin, melanoma, coronary artery disease status post CABG in 2013, chronic hyponatremia, hypertension, hyperlipidemia, gout, paroxysmal A. fib not on anticoagulation, recurrent UTIs who was sent from cancer center for direct admission for the evaluation of hyponatremia, AKI.  Patient reported feeling fatigued, dizzy since his most recent chemotherapy, also poor oral intake.  He was noted to have sodium of 118 and creatinine of 2.8 at the cancer center.  On presentation, he was hemodynamically stable, afebrile.  EKG done at the bedside showed A. fib with RVR, his blood pressure was soft.  Patient was admitted for further evaluation and management.  Nephrology, cardiology following.  Assessment & Plan:   Principal Problem:   Hyponatremia Active Problems:   Essential hypertension   CAD s/p CABG   Paroxysmal atrial fibrillation (HCC)   Malignant neoplasm of urinary bladder (HCC)   Acute lower UTI   Acute renal failure superimposed on stage 3a chronic kidney disease (HCC)   Sepsis with acute renal failure and septic shock (HCC)   AKI on CKD stage IIIa: renal US/bladder showed mild left hydronephrosis,left lower pole renal stone.Markedly irregular bladder wall, similar to prior CT. CT abdomen/pelvis on 09/09/2020 showed extensive thickening of the superior urinary bladder wall with multiple small diverticula and extensive fat stranding consistent with bladder malignancy. Patient presumed to be dehydrated on presentation and was started on IV fluids.  AKI could also be from nephrotoxicity from chemotherapy.Now  he is having good urine output. Nephrology following.  Baseline creatinine ranges from 1.3-1.6.  Kidney function is improving and near baseline.  IV  fluids discontinued  Acute on chronic hyponatremia: Suspected to be from fluid loss. .Baseline sodium around low 130s.  Presented with sodium of 118.  Now near baseline  Paroxysmal A. fib: On RVR on presentation and this mrng also.  He had history of A. fib several years ago and was on Xarelto which has been discontinued.  He was started on heparin drip during this admission.    Cardiology consulted.CHA2DS2-VASc: 3 .  Patient was complaining of hematuria and there is drop of hemoglobin from 9 to 7  so heparin discontinued for now. Echo showed ejection fraction of 50%. Hospital course remarkable for A. fib with RVR, started on diltiazem drip.  Now on oral Cardizem and metoprolol.  Heart rate has somewhat improved but his blood pressure is low.  Will follow up with cardiology for final recommendation before discharge to home.  History of recurrent UTI: History of recurrent staph epidermidis UTI.  UA was suggestive of UTI.  Patient denies any dysuria.  Started on doxycycline.  Multiple organisms as per urine culture  Normocytic anemia: Hemoglobin dropped to the range of 7.  We will transfuse him with a unit of PRBC  Hypokalemia: Supplemented and monitored  Hypertension: Blood pressure soft.  Continue current medications  Bladder cancer: Follows with Dr. Alen Blew.  Recently on cisplatin/gemcitabine.  Complained of some hematuria on 11/11/20 but it has resolved now..  We will continue to monitor.    History of coronary artery disease: Status post CABG in 2013.  On aspirin and statin at home.  Generalized weakness:  PT/OT evaluation recommended home health  Decreased oral intake: Started on Megace.  Nutrition consulted  Right hip pain: Checking x-ray of the right hip  Nutrition Problem: Increased nutrient needs Etiology: cancer and cancer related treatments      DVT prophylaxis: Heparin subcu Code Status: None Family Communication: Wife on phone 11/14/20 Status is: Inpatient  Remains  inpatient appropriate because:Inpatient level of care appropriate due to severity of illness   Dispo: The patient is from: Home              Anticipated d/c is to: Home              Anticipated d/c date LN:[id number]              Patient currently is not medically stable to d/c.    Consultants: Cardiology, nephrology  Procedures: None  Antimicrobials:  Anti-infectives (From admission, onward)   Start     Dose/Rate Route Frequency Ordered Stop   11/10/20 2200  doxycycline (VIBRA-TABS) tablet 100 mg        100 mg Oral Every 12 hours 11/10/20 1748 11/15/20 2159   11/10/20 1600  vancomycin (VANCOREADY) IVPB 1250 mg/250 mL  Status:  Discontinued        1,250 mg 166.7 mL/hr over 90 Minutes Intravenous Every 48 hours 11/10/20 1524 11/10/20 1717      Subjective:  Patient seen and examined at the bedside this morning.  Heart rate better than yesterday but still fluctuating between 100-120.  Blood pressure is soft.  Complains of right hip pain today.   Objective: Vitals:   11/14/20 0400 11/14/20 0500 11/14/20 0800 11/14/20 1305  BP: 103/65  98/65 (!) 104/56  Pulse: 99   96  Resp: 17   20  Temp: 99.9 F (37.7 C)   98.9 F (37.2 C)  TempSrc: Oral   Oral  SpO2: 96%     Weight:  84 kg    Height:        Intake/Output Summary (Last 24 hours) at 11/14/2020 1318 Last data filed at 11/14/2020 1003 Gross per 24 hour  Intake 597 ml  Output 1675 ml  Net -1078 ml   Filed Weights   11/12/20 0529 11/13/20 0500 11/14/20 0500  Weight: 83.8 kg 84.5 kg 84 kg    Examination:   General exam: Overall comfortable HEENT:PERRL,Oral mucosa moist, Ear/Nose normal on gross exam Respiratory system: Bilateral equal air entry, normal vesicular breath sounds, no wheezes or crackles  Cardiovascular system: Irregularly irregular rhythm. No JVD, murmurs, rubs, gallops or clicks.  Chemo-Port on the right chest Gastrointestinal system: Abdomen is nondistended, soft and nontender. No organomegaly or  masses felt. Normal bowel sounds heard. Central nervous system: Alert and oriented. No focal neurological deficits. Extremities: No edema, no clubbing ,no cyanosis Skin: No rashes, lesions or ulcers,no icterus ,no pallor      Data Reviewed: I have personally reviewed following labs and imaging studies  CBC: Recent Labs  Lab 11/10/20 1136 11/10/20 1136 11/11/20 0616 11/11/20 1705 11/12/20 0504 11/13/20 0548 11/14/20 0315  WBC 2.6*  --  2.2*  --  4.2 5.9 7.0  NEUTROABS 2.2  --   --   --   --   --   --   HGB 9.6*  --  7.7* 7.5* 7.6* 7.4* 7.2*  HCT 26.6*   < > 21.6* 21.4* 21.8* 21.2* 20.6*  MCV 86.9  --  88.9  --  90.8 91.8 92.0  PLT 84*  --  61*  --  56* 69* 101*   < > = values in this interval not displayed.   Basic Metabolic Panel: Recent Labs  Lab 11/10/20  1630 11/10/20 1630 11/10/20 2125 11/11/20 0616 11/12/20 0504 11/13/20 0548 11/14/20 0315  NA 121*   < > 122* 124* 129* 129* 131*  K 4.0  --   --  3.0* 3.7 3.4* 3.7  CL 87*  --   --  93* 102 99 102  CO2 22  --   --  21* 20* 20* 21*  GLUCOSE 145*  --   --  162* 117* 106* 113*  BUN 63*  --   --  61* 57* 43* 35*  CREATININE 2.86*  --   --  2.46* 2.03* 1.88* 1.60*  CALCIUM 8.3*  --   --  7.7* 7.9* 7.8* 7.8*  MG 2.2  --   --   --   --   --   --    < > = values in this interval not displayed.   GFR: Estimated Creatinine Clearance: 40.5 mL/min (A) (by C-G formula based on SCr of 1.6 mg/dL (H)). Liver Function Tests: Recent Labs  Lab 11/10/20 1136  AST 66*  ALT 46*  ALKPHOS 92  BILITOT 0.7  PROT 6.5  ALBUMIN 2.4*   No results for input(s): LIPASE, AMYLASE in the last 168 hours. No results for input(s): AMMONIA in the last 168 hours. Coagulation Profile: No results for input(s): INR, PROTIME in the last 168 hours. Cardiac Enzymes: No results for input(s): CKTOTAL, CKMB, CKMBINDEX, TROPONINI in the last 168 hours. BNP (last 3 results) No results for input(s): PROBNP in the last 8760 hours. HbA1C: No  results for input(s): HGBA1C in the last 72 hours. CBG: No results for input(s): GLUCAP in the last 168 hours. Lipid Profile: No results for input(s): CHOL, HDL, LDLCALC, TRIG, CHOLHDL, LDLDIRECT in the last 72 hours. Thyroid Function Tests: No results for input(s): TSH, T4TOTAL, FREET4, T3FREE, THYROIDAB in the last 72 hours. Anemia Panel: Recent Labs    11/12/20 0504  FERRITIN >1,500*  TIBC 186*  IRON 82   Sepsis Labs: No results for input(s): PROCALCITON, LATICACIDVEN in the last 168 hours.  Recent Results (from the past 240 hour(s))  Urine Culture     Status: Abnormal   Collection Time: 11/06/20  4:11 PM   Specimen: Urine, Clean Catch  Result Value Ref Range Status   Specimen Description   Final    URINE, CLEAN CATCH Performed at [facility], [address] [place], [place] [address]    Special Requests   Final    NONE Performed at [facility], [place] [address], [place], [place] [address]    Culture 80,000 COLONIES/mL STAPHYLOCOCCUS EPIDERMIDIS (A)  Final   Report Status 11/08/2020 FINAL  Final   Organism ID, Bacteria STAPHYLOCOCCUS EPIDERMIDIS (A)  Final      Susceptibility   Staphylococcus epidermidis - MIC*    CIPROFLOXACIN <=0.5 SENSITIVE Sensitive     GENTAMICIN <=0.5 SENSITIVE Sensitive     NITROFURANTOIN <=16 SENSITIVE Sensitive     OXACILLIN >=4 RESISTANT Resistant     TETRACYCLINE 2 SENSITIVE Sensitive     VANCOMYCIN 2 SENSITIVE Sensitive     TRIMETH/SULFA 160 RESISTANT Resistant     CLINDAMYCIN <=0.25 SENSITIVE Sensitive     RIFAMPIN <=0.5 SENSITIVE Sensitive     Inducible Clindamycin NEGATIVE Sensitive     * 80,000 COLONIES/mL STAPHYLOCOCCUS EPIDERMIDIS  Urine Culture     Status: Abnormal   Collection Time: 11/10/20 11:48 AM   Specimen: Urine, Clean Catch  Result Value Ref Range Status   Specimen Description  Final    URINE, CLEAN CATCH Performed at [facility], [place]  [address], [place], [place] [address]    Special Requests   Final    NONE Performed at [facility], [place] [address] [place], [place] [address]    Culture >=100,000 COLONIES/mL STAPHYLOCOCCUS EPIDERMIDIS (A)  Final   Report Status 11/12/2020 FINAL  Final   Organism ID, Bacteria STAPHYLOCOCCUS EPIDERMIDIS (A)  Final      Susceptibility   Staphylococcus epidermidis - MIC*    CIPROFLOXACIN <=0.5 SENSITIVE Sensitive     GENTAMICIN <=0.5 SENSITIVE Sensitive     NITROFURANTOIN <=16 SENSITIVE Sensitive     OXACILLIN >=4 RESISTANT Resistant     TETRACYCLINE 2 SENSITIVE Sensitive     VANCOMYCIN 1 SENSITIVE Sensitive     TRIMETH/SULFA 160 RESISTANT Resistant     CLINDAMYCIN <=0.25 SENSITIVE Sensitive     RIFAMPIN <=0.5 SENSITIVE Sensitive     Inducible Clindamycin NEGATIVE Sensitive     * >=100,000 COLONIES/mL STAPHYLOCOCCUS EPIDERMIDIS  Respiratory Panel by RT PCR (Flu A&B, Covid) - Nasopharyngeal Swab     Status: None   Collection Time: 11/10/20  3:48 PM   Specimen: Nasopharyngeal Swab  Result Value Ref Range Status   SARS Coronavirus 2 by RT PCR NEGATIVE NEGATIVE Final    Comment: (NOTE) SARS-CoV-2 target nucleic acids are NOT DETECTED.  The SARS-CoV-2 RNA is generally detectable in upper respiratoy specimens during the acute phase of infection. The lowest concentration of SARS-CoV-2 viral copies this assay can detect is 131 copies/mL. A negative result does not preclude SARS-Cov-2 infection and should not be used as the sole basis for treatment or other patient management decisions. A negative result may occur with  improper specimen collection/handling, submission of specimen other than nasopharyngeal swab, presence of viral mutation(s) within the areas targeted by this assay, and inadequate number of viral copies (<131 copies/mL). A negative result must be combined with clinical observations, patient history, and epidemiological information. The expected  result is Negative.  Fact Sheet for Patients:  PinkCheek.be  Fact Sheet for Healthcare Providers:  GravelBags.it  This test is no t yet approved or cleared by the [place] FDA and  has been authorized for detection and/or diagnosis of SARS-CoV-2 by FDA under an Emergency Use Authorization (EUA). This EUA will remain  in effect (meaning this test can be used) for the duration of the COVID-19 declaration under Section 564(b)(1) of the Act, 21 U.S.C. section 360bbb-3(b)(1), unless the authorization is terminated or revoked sooner.     Influenza A by PCR NEGATIVE NEGATIVE Final   Influenza B by PCR NEGATIVE NEGATIVE Final    Comment: (NOTE) The Xpert Xpress SARS-CoV-2/FLU/RSV assay is intended as an aid in  the diagnosis of influenza from Nasopharyngeal swab specimens and  should not be used as a sole basis for treatment. Nasal washings and  aspirates are unacceptable for Xpert Xpress SARS-CoV-2/FLU/RSV  testing.  Fact Sheet for Patients: PinkCheek.be  Fact Sheet for Healthcare Providers: GravelBags.it  This test is not yet approved or cleared by the [place] FDA and  has been authorized for detection and/or diagnosis of SARS-CoV-2 by  FDA under an Emergency Use Authorization (EUA). This EUA will remain  in effect (meaning this test can be used) for the duration of the  Covid-19 declaration under Section 564(b)(1) of the Act, 21  U.S.C. section 360bbb-3(b)(1), unless the authorization is  terminated or revoked. Performed at [facility], [place]  [address] [place], [place] [address]   Culture, Urine     Status: Abnormal   Collection Time: 11/11/20  5:14 PM   Specimen: Urine, Clean Catch  Result Value Ref Range Status   Specimen Description   Final    URINE, CLEAN CATCH Performed at [facility], [name] [address]  [place], [place] [address]    Special Requests   Final    NONE Performed at [facility], [place] [address], [place],  [address]    Culture MULTIPLE SPECIES PRESENT, SUGGEST RECOLLECTION (A)  Final   Report Status 11/13/2020 FINAL  Final         Radiology Studies: No results found.      Scheduled Meds: . sodium chloride   Intravenous Once  . atorvastatin  20 mg Oral q1800  . Chlorhexidine Gluconate Cloth  6 each Topical Daily  . diltiazem  120 mg Oral Daily  . doxycycline  100 mg Oral Q12H  . feeding supplement  237 mL Oral BID BM  . heparin injection (subcutaneous)  5,000 Units Subcutaneous Q8H  . [START ON 11/15/2020] megestrol  400 mg Oral Daily  . metoprolol tartrate  25 mg Oral Once  . metoprolol tartrate  50 mg Oral BID  . sodium chloride flush  3 mL Intravenous Q12H  . tamsulosin  0.4 mg Oral Daily   Continuous Infusions:    LOS: 4 days    Time spent:35 mins, More than 50% of that time was spent in counseling and/or coordination of care.      Shelly Coss, MD [facility] P11/19/2021, 1:18 PM

## 2020-11-14 NOTE — Evaluation (Signed)
Physical Therapy Evaluation Patient Details Name: Hunter [name] MRN: [id number] DOB: 1946/07/25 Today's Date: 11/14/2020   History of Present Illness  Patient is a 74 year old male PMH of bladder cancer on gemcitabine and cisplatin, melanoma, CAD s/p CABG 2013, chronic hyponatremia, hypertension, hyperlipidemia, gout, atrial fibrillation, recurrent staph epi UTIs who was a direct admission from the cancer center on 11/15 for hyponatremia.  Clinical Impression   Pt admitted with above diagnosis. Pt presenting with mild deficits in strength , mobility, endurance, and balance.  He was able to ambulate in hallway with min guard.  Pt's HR did elevate as high as 150 bpm briefly with activity, but tended to be 120's-130's with ambulation.  BP was stable during therapy. Pt with good family/friend support and necessary DME.  He did have c/o some R hip pain that seems to be improving and did not limit mobility significantly -recommended f/u with outpt PT if not improved. At this time, pt and wife felt HHPT was not necessary.  Pt currently with functional limitations due to the deficits listed below (see PT Problem List). Pt will benefit from skilled PT to increase their independence and safety with mobility to allow discharge to the venue listed below.       Follow Up Recommendations Supervision - Intermittent;Other (comment) (possible HHPT vs none - pending family/pt decision)    Equipment Recommendations  None recommended by PT (has borrowed a RW)    Recommendations for Other Services       Precautions / Restrictions Precautions Precautions: Fall Precaution Comments: monitor HR      Mobility  Bed Mobility Overal bed mobility: Needs Assistance Bed Mobility: Rolling;Sidelying to Sit;Sit to Sidelying Rolling: Supervision Sidelying to sit: Min assist     Sit to sidelying: Min assist General bed mobility comments: use of bed rails    Transfers Overall transfer level: Needs  assistance Equipment used: Rolling walker (2 wheeled) Transfers: Sit to/from Stand Sit to Stand: Min guard         General transfer comment: min guard for safety  Ambulation/Gait Ambulation/Gait assistance: Min guard Gait Distance (Feet): 200 Feet Assistive device: Rolling walker (2 wheeled) Gait Pattern/deviations: Step-through pattern;Decreased stride length Gait velocity: decreased   General Gait Details: min guard for safety, cues for RW proximity and assist with RW for turns due to L UE weakness  Stairs            Wheelchair Mobility    Modified Rankin (Stroke Patients Only)       Balance Overall balance assessment: Needs assistance Sitting-balance support: Feet supported Sitting balance-Leahy Scale: Good     Standing balance support: No upper extremity supported;Bilateral upper extremity supported Standing balance-Leahy Scale: Fair Standing balance comment: Used RW for gait but able to static stand without UE support                             Pertinent Vitals/Pain Pain Assessment: Faces Faces Pain Scale: Hurts a little bit Pain Location: R hip Pain Descriptors / Indicators: Discomfort Pain Intervention(s): Limited activity within patient's tolerance;Monitored during session (reports significantly better than previous days)    Home Living Family/patient expects to be discharged to:: Private residence Living Arrangements: Spouse/significant other Available Help at Discharge: Family;Available 24 hours/day;Friend(s) Type of Home: House Home Access: Stairs to enter   [employer] of Steps: 1 Home Layout: Multi-level;Other (Comment) (reports can stay on main level if necessary (bedroom upstairs0) Home Equipment: Shower  seat - built in      Prior Function Level of Independence: Independent         Comments: Playing golf up until a few weeks ago     Hand Dominance        Extremity/Trunk Assessment   Upper Extremity  Assessment Upper Extremity Assessment: LUE deficits/detail LUE Deficits / Details: hx of polio with very limited strength/functional use - strong enough grip to hold RW    Lower Extremity Assessment Lower Extremity Assessment: LLE deficits/detail;RLE deficits/detail RLE Deficits / Details: ROM WFL; MMT ankle 5/5, knee 5/5, hip 4/5 LLE Deficits / Details: ROM WFL; MMT 5/5    Cervical / Trunk Assessment Cervical / Trunk Assessment: Normal  Communication   Communication: No difficulties  Cognition Arousal/Alertness: Awake/alert Behavior During Therapy: WFL for tasks assessed/performed Overall Cognitive Status: Within Functional Limits for tasks assessed                                        General Comments General comments (skin integrity, edema, etc.): O2 sats >94% on RA throughout session; HR 90's-120's rest and fluctuating 113-130's ambulation with brief episodes at 140-150, pt with afib; BP was 110's/60's throughout session   Pt had reported recent R hip/buttock pain.  Seemed to be relieved with getting OOB and moving.  Did still have some pain with AROM but reports significantly better than past few days. Noted imaging was negative for fracture.  Advised return to normal activity and if continued to have issues follow up with outpt.     Exercises     Assessment/Plan    PT Assessment Patient needs continued PT services  PT Problem List Decreased strength;Decreased mobility;Decreased activity tolerance;Cardiopulmonary status limiting activity;Decreased balance;Decreased knowledge of use of DME;Pain       PT Treatment Interventions DME instruction;Therapeutic activities;Modalities;Gait training;Therapeutic exercise;Patient/family education;Stair training;Balance training;Functional mobility training    PT Goals (Current goals can be found in the Care Plan section)  Acute Rehab PT Goals Patient Stated Goal: go home PT Goal Formulation: With  patient/family Time For Goal Achievement: 11/28/20 Potential to Achieve Goals: Good    Frequency Min 3X/week   Barriers to discharge        Co-evaluation               AM-PAC PT "6 Clicks" Mobility  Outcome Measure Help needed turning from your back to your side while in a flat bed without using bedrails?: A Little Help needed moving from lying on your back to sitting on the side of a flat bed without using bedrails?: A Little Help needed moving to and from a bed to a chair (including a wheelchair)?: A Little Help needed standing up from a chair using your arms (e.g., wheelchair or bedside chair)?: A Little Help needed to walk in hospital room?: A Little Help needed climbing 3-5 steps with a railing? : A Little 6 Click Score: 18    End of Session Equipment Utilized During Treatment: Gait belt Activity Tolerance: Patient tolerated treatment well Patient left: in bed;with bed alarm set;with call bell/phone within reach Nurse Communication: Mobility status PT Visit Diagnosis: Unsteadiness on feet (R26.81);Muscle weakness (generalized) (M62.81);Pain Pain - Right/Left: Right Pain - part of body: Hip    Time: 1640-1705 PT Time Calculation (min) (ACUTE ONLY): 25 min   Charges:   PT Evaluation $PT Eval Moderate Complexity: 1 Mod PT Treatments $Gait Training:  8-22 mins        Abran Richard, PT Acute Rehab Services Pager [phone] [facility] [phone]    Karlton Lemon 11/14/2020, 5:19 PM

## 2020-11-14 NOTE — Progress Notes (Signed)
Progress Note  Patient Name: Hunter [name] Date of Encounter: 11/14/2020  [facility] Cardiologist: Hunter Klein, MD   Subjective   Feeling OK.  Denies chest pain, shortness of breath, or palpitations.  Inpatient Medications    Scheduled Meds: . sodium chloride   Intravenous Once  . atorvastatin  20 mg Oral q1800  . Chlorhexidine Gluconate Cloth  6 each Topical Daily  . diltiazem  120 mg Oral Daily  . doxycycline  100 mg Oral Q12H  . feeding supplement  237 mL Oral BID BM  . heparin injection (subcutaneous)  5,000 Units Subcutaneous Q8H  . [START ON 11/15/2020] megestrol  400 mg Oral Daily  . metoprolol tartrate  25 mg Oral Once  . metoprolol tartrate  50 mg Oral BID  . sodium chloride flush  3 mL Intravenous Q12H  . tamsulosin  0.4 mg Oral Daily   Continuous Infusions:  PRN Meds: acetaminophen **OR** acetaminophen, Muscle Rub, polyethylene glycol, prochlorperazine, sodium chloride flush, traMADol   Vital Signs    Vitals:   11/14/20 0500 11/14/20 0800 11/14/20 1305 11/14/20 1326  BP:  98/65 (!) 104/56 126/87  Pulse:   96 (!) 105  Resp:   20 20  Temp:   98.9 F (37.2 C) 98.7 F (37.1 C)  TempSrc:   Oral Axillary  SpO2:    98%  Weight: 84 kg     Height:        Intake/Output Summary (Last 24 hours) at 11/14/2020 1341 Last data filed at 11/14/2020 1305 Gross per 24 hour  Intake 847 ml  Output 1675 ml  Net -828 ml   Last 3 Weights 11/14/2020 11/13/2020 11/12/2020  Weight (lbs) 185 lb 3 oz 186 lb 4.6 oz 184 lb 11.9 oz  Weight (kg) 84 kg 84.5 kg 83.8 kg      Telemetry    Atrial fibrillation.  Rates 80s to 140s.  Mostly under 100.- Personally Reviewed  ECG    N/A- Personally Reviewed  Physical Exam   VS:  BP 126/87   Pulse (!) 105   Temp 98.7 F (37.1 C) (Axillary)   Resp 20   Ht 5\' 9"  (1.753 m)   Wt 84 kg   SpO2 98%   BMI 27.35 kg/m  , BMI Body mass index is 27.35 kg/m. GENERAL:  Well appearing HEENT: Pupils equal round and  reactive, fundi not visualized, oral mucosa unremarkable NECK:  No jugular venous distention, waveform within normal limits, carotid upstroke brisk and symmetric, no bruits LUNGS:  Clear to auscultation bilaterally HEART:  Irregularly irregular.  PMI not displaced or sustained,S1 and S2 within normal limits, no S3, no S4, no clicks, no rubs, no murmurs ABD:  Flat, positive bowel sounds normal in frequency in pitch, no bruits, no rebound, no guarding, no midline pulsatile mass, no hepatomegaly, no splenomegaly EXT:  2 plus pulses throughout, no edema, no cyanosis no clubbing SKIN:  No rashes no nodules NEURO:  Cranial nerves II through XII grossly intact, motor grossly intact throughout PSYCH:  Cognitively intact, oriented to person place and time   Labs    High Sensitivity Troponin:  No results for input(s): TROPONINIHS in the last 720 hours.    Chemistry Recent Labs  Lab 11/10/20 1136 11/10/20 1630 11/12/20 0504 11/13/20 0548 11/14/20 0315  NA 118*   < > 129* 129* 131*  K 3.2*   < > 3.7 3.4* 3.7  CL 84*   < > 102 99 102  CO2 21*   < >  20* 20* 21*  GLUCOSE 184*   < > 117* 106* 113*  BUN 62*   < > 57* 43* 35*  CREATININE 2.89*   < > 2.03* 1.88* 1.60*  CALCIUM 8.8*   < > 7.9* 7.8* 7.8*  PROT 6.5  --   --   --   --   ALBUMIN 2.4*  --   --   --   --   AST 66*  --   --   --   --   ALT 46*  --   --   --   --   ALKPHOS 92  --   --   --   --   BILITOT 0.7  --   --   --   --   GFRNONAA 22*   < > 34* 37* 45*  ANIONGAP 13   < > 7 10 8    < > = values in this interval not displayed.     Hematology Recent Labs  Lab 11/12/20 0504 11/13/20 0548 11/14/20 0315  WBC 4.2 5.9 7.0  RBC 2.40* 2.31* 2.24*  HGB 7.6* 7.4* 7.2*  HCT 21.8* 21.2* 20.6*  MCV 90.8 91.8 92.0  MCH 31.7 32.0 32.1  MCHC 34.9 34.9 35.0  RDW 15.6* 16.0* 16.3*  PLT 56* 69* 101*    BNPNo results for input(s): BNP, PROBNP in the last 168 hours.   DDimer No results for input(s): DDIMER in the last 168 hours.    Radiology    No results found.  Cardiac Studies   Echo 11/11/2020: IMPRESSIONS   1. Left ventricular ejection fraction, by estimation, is 50%. The left  ventricle has low normal function with beat to beat variability. The left  ventricle has no regional wall motion abnormalities. There is mild left  ventricular hypertrophy. Left  ventricular diastolic parameters are indeterminate.  2. Right ventricular systolic function is normal. The right ventricular  size is normal. Tricuspid regurgitation signal is inadequate for assessing  PA pressure.  3. The mitral valve is normal in structure. Trivial mitral valve  regurgitation. No evidence of mitral stenosis.  4. The aortic valve is normal in structure. Aortic valve regurgitation is  not visualized. No aortic stenosis is present.   Patient Profile     Hunter [name] is a 79M with bladder cancer on gemcitabine/cisplatin, CAD s/p CABG, post-op atrial fibrillation, hypertension, hyperlipidemia, CKD III and GERD admitted with profound hyponatremia, intravascular volume depletion and atrial fibrillation with RVR.  Assessment & Plan    # Atrial fibrillation with rapid ventricular response.: Initially thought to be due to profound metabolic arrangement with hyponatremia and intravascular volume depletion.  He transiently converted back to sinus rhythm but is now back in atrial fibrillation.  Rates are mostly controlled.  This morning he was ambulating around his room and doing some exercises.  His heart rate increased to the 140s.  He continues to be asymptomatic in atrial fibrillation.  Blood pressures are little soft.  Diltiazem was switched from 60 mg every 8 hours to 120 mg today.  Suspect his heart rate will be poorly controlled.  Will increase metoprolol to 50 mg twice daily.  He is not able to tolerate anticoagulation.  He developed hematuria on heparin.  Patient will be getting blood transfusion today.  He was previously intolerant of  amiodarone, though he doesn't remember why.  # CAD s/p CABG:  # Hyperlipidemia: Maintain hbg >8.  Denies chest pain or shortness of breath. Continue atorvastatin.  For questions or updates, please contact [place] Please consult [email or web address] for contact info under        Signed, Skeet Latch, MD  11/14/2020, 1:41 PM

## 2020-11-15 ENCOUNTER — Other Ambulatory Visit: Payer: Self-pay | Admitting: Cardiology

## 2020-11-15 DIAGNOSIS — I2510 Atherosclerotic heart disease of native coronary artery without angina pectoris: Secondary | ICD-10-CM | POA: Diagnosis not present

## 2020-11-15 DIAGNOSIS — I4891 Unspecified atrial fibrillation: Secondary | ICD-10-CM

## 2020-11-15 DIAGNOSIS — E871 Hypo-osmolality and hyponatremia: Secondary | ICD-10-CM | POA: Diagnosis not present

## 2020-11-15 DIAGNOSIS — I480 Paroxysmal atrial fibrillation: Secondary | ICD-10-CM

## 2020-11-15 DIAGNOSIS — N179 Acute kidney failure, unspecified: Secondary | ICD-10-CM | POA: Diagnosis not present

## 2020-11-15 LAB — CBC
HCT: 23.1 % — ABNORMAL LOW (ref 39.0–52.0)
HCT: 24.6 % — ABNORMAL LOW (ref 39.0–52.0)
Hemoglobin: 8 g/dL — ABNORMAL LOW (ref 13.0–17.0)
Hemoglobin: 8.4 g/dL — ABNORMAL LOW (ref 13.0–17.0)
MCH: 31.4 pg (ref 26.0–34.0)
MCH: 30.9 pg (ref 26.0–34.0)
MCHC: 34.6 g/dL (ref 30.0–36.0)
MCHC: 34.1 g/dL (ref 30.0–36.0)
MCV: 90.6 fL (ref 80.0–100.0)
MCV: 90.4 fL (ref 80.0–100.0)
Platelets: 133 10*3/uL — ABNORMAL LOW (ref 150–400)
Platelets: 150 10*3/uL (ref 150–400)
RBC: 2.55 MIL/uL — ABNORMAL LOW (ref 4.22–5.81)
RBC: 2.72 MIL/uL — ABNORMAL LOW (ref 4.22–5.81)
RDW: 16.2 % — ABNORMAL HIGH (ref 11.5–15.5)
RDW: 16.3 % — ABNORMAL HIGH (ref 11.5–15.5)
WBC: 6.2 10*3/uL (ref 4.0–10.5)
WBC: 6.9 10*3/uL (ref 4.0–10.5)
nRBC: 1.1 % — ABNORMAL HIGH (ref 0.0–0.2)
nRBC: 1 % — ABNORMAL HIGH (ref 0.0–0.2)

## 2020-11-15 LAB — TYPE AND SCREEN
ABO/RH(D): O POS
Antibody Screen: NEGATIVE
Unit division: 0

## 2020-11-15 LAB — BASIC METABOLIC PANEL
Anion gap: 8 (ref 5–15)
BUN: 34 mg/dL — ABNORMAL HIGH (ref 8–23)
CO2: 22 mmol/L (ref 22–32)
Calcium: 7.9 mg/dL — ABNORMAL LOW (ref 8.9–10.3)
Chloride: 103 mmol/L (ref 98–111)
Creatinine, Ser: 1.73 mg/dL — ABNORMAL HIGH (ref 0.61–1.24)
GFR, Estimated: 41 mL/min — ABNORMAL LOW (ref >60)
Glucose, Bld: 100 mg/dL — ABNORMAL HIGH (ref 70–99)
Potassium: 3.6 mmol/L (ref 3.5–5.1)
Sodium: 133 mmol/L — ABNORMAL LOW (ref 135–145)

## 2020-11-15 LAB — BPAM RBC
Blood Product Expiration Date: 202112202359
ISSUE DATE / TIME: 202111191251
Unit Type and Rh: 5100

## 2020-11-15 MED ORDER — METOPROLOL TARTRATE 50 MG PO TABS
50.0000 mg | ORAL_TABLET | Freq: Two times a day (BID) | ORAL | 1 refills | Status: DC
Start: 2020-11-15 — End: 2020-12-08

## 2020-11-15 MED ORDER — ATORVASTATIN CALCIUM 20 MG PO TABS
20.0000 mg | ORAL_TABLET | Freq: Every day | ORAL | 1 refills | Status: DC
Start: 2020-11-15 — End: 2021-01-27

## 2020-11-15 MED ORDER — POLYETHYLENE GLYCOL 3350 17 G PO PACK: 17.0000 g | PACK | Freq: Every day | ORAL | 1 refills | Status: DC | PRN

## 2020-11-15 MED ORDER — TAMSULOSIN HCL 0.4 MG PO CAPS
0.4000 mg | ORAL_CAPSULE | Freq: Every day | ORAL | 1 refills | Status: DC
Start: 2020-11-15 — End: 2021-02-10

## 2020-11-15 MED ORDER — MEGESTROL ACETATE 400 MG/10ML PO SUSP
400.0000 mg | Freq: Every day | ORAL | 1 refills | Status: DC
Start: 2020-11-16 — End: 2021-02-10

## 2020-11-15 MED ORDER — HEPARIN SOD (PORK) LOCK FLUSH 100 UNIT/ML IV SOLN
500.0000 [IU] | INTRAVENOUS | Status: DC | PRN
  Filled 2020-11-15: qty 5

## 2020-11-15 NOTE — Plan of Care (Signed)
  Problem: Education: Goal: Knowledge of General Education information will improve Description: Including pain rating scale, medication(s)/side effects and non-pharmacologic comfort measures 11/15/2020 0728 by Baker Pierini, RN Outcome: Progressing 11/15/2020 0406 by Baker Pierini, RN Outcome: Progressing   Problem: Health Behavior/Discharge Planning: Goal: Ability to manage health-related needs will improve 11/15/2020 0728 by Baker Pierini, RN Outcome: Progressing 11/15/2020 0406 by Baker Pierini, RN Outcome: Progressing   Problem: Clinical Measurements: Goal: Will remain free from infection 11/15/2020 0728 by Baker Pierini, RN Outcome: Progressing 11/15/2020 0406 by Baker Pierini, RN Outcome: Progressing Goal: Diagnostic test results will improve 11/15/2020 0728 by Baker Pierini, RN Outcome: Progressing 11/15/2020 0406 by Baker Pierini, RN Outcome: Progressing Goal: Respiratory complications will improve 11/15/2020 0728 by Baker Pierini, RN Outcome: Progressing 11/15/2020 0406 by Baker Pierini, RN Outcome: Progressing   Problem: Activity: Goal: Risk for activity intolerance will decrease 11/15/2020 0728 by Baker Pierini, RN Outcome: Progressing 11/15/2020 0406 by Baker Pierini, RN Outcome: Progressing   Problem: Nutrition: Goal: Adequate nutrition will be maintained 11/15/2020 0728 by Baker Pierini, RN Outcome: Progressing 11/15/2020 0406 by Baker Pierini, RN Outcome: Progressing   Problem: Coping: Goal: Level of anxiety will decrease 11/15/2020 0728 by Baker Pierini, RN Outcome: Progressing 11/15/2020 0406 by Baker Pierini, RN Outcome: Progressing   Problem: Elimination: Goal: Will not experience complications related to bowel motility 11/15/2020 0728 by Baker Pierini, RN Outcome: Progressing 11/15/2020 0406 by Baker Pierini, RN Outcome: Progressing Goal: Will not experience complications related to  urinary retention 11/15/2020 0728 by Baker Pierini, RN Outcome: Progressing 11/15/2020 0406 by Baker Pierini, RN Outcome: Progressing   Problem: Pain Managment: Goal: General experience of comfort will improve 11/15/2020 0728 by Baker Pierini, RN Outcome: Progressing 11/15/2020 0406 by Baker Pierini, RN Outcome: Progressing   Problem: Safety: Goal: Ability to remain free from injury will improve 11/15/2020 0728 by Baker Pierini, RN Outcome: Progressing 11/15/2020 0406 by Baker Pierini, RN Outcome: Progressing   Problem: Skin Integrity: Goal: Risk for impaired skin integrity will decrease 11/15/2020 0728 by Baker Pierini, RN Outcome: Progressing 11/15/2020 0406 by Baker Pierini, RN Outcome: Progressing

## 2020-11-15 NOTE — Plan of Care (Signed)
  Problem: Education: Goal: Knowledge of General Education information will improve Description: Including pain rating scale, medication(s)/side effects and non-pharmacologic comfort measures Outcome: Progressing   Problem: Health Behavior/Discharge Planning: Goal: Ability to manage health-related needs will improve Outcome: Progressing   Problem: Clinical Measurements: Goal: Will remain free from infection Outcome: Progressing Goal: Diagnostic test results will improve Outcome: Progressing Goal: Respiratory complications will improve Outcome: Progressing   Problem: Activity: Goal: Risk for activity intolerance will decrease Outcome: Progressing   Problem: Nutrition: Goal: Adequate nutrition will be maintained Outcome: Progressing   Problem: Coping: Goal: Level of anxiety will decrease Outcome: Progressing   Problem: Elimination: Goal: Will not experience complications related to bowel motility Outcome: Progressing Goal: Will not experience complications related to urinary retention Outcome: Progressing   Problem: Pain Managment: Goal: General experience of comfort will improve Outcome: Progressing   Problem: Safety: Goal: Ability to remain free from injury will improve Outcome: Progressing   Problem: Skin Integrity: Goal: Risk for impaired skin integrity will decrease Outcome: Progressing   

## 2020-11-15 NOTE — Progress Notes (Addendum)
Progress Note  Patient Name: Hunter [name] Date of Encounter: 11/15/2020  [facility] Cardiologist: Sanda Klein, MD   Subjective   Denies any chest pain or dyspnea.  Currently in sinus rhythm in 80s.  BP soft, 88/63 this AM.  Renal function stable (2.5 > 2.0 >1.9 >1.6 >1.7).  Inpatient Medications    Scheduled Meds: . sodium chloride   Intravenous Once  . atorvastatin  20 mg Oral q1800  . Chlorhexidine Gluconate Cloth  6 each Topical Daily  . diltiazem  120 mg Oral Daily  . doxycycline  100 mg Oral Q12H  . feeding supplement  237 mL Oral BID BM  . heparin injection (subcutaneous)  5,000 Units Subcutaneous Q8H  . megestrol  400 mg Oral Daily  . metoprolol tartrate  25 mg Oral Once  . metoprolol tartrate  50 mg Oral BID  . sodium chloride flush  3 mL Intravenous Q12H  . tamsulosin  0.4 mg Oral Daily   Continuous Infusions:  PRN Meds: acetaminophen **OR** acetaminophen, Muscle Rub, polyethylene glycol, prochlorperazine, sodium chloride flush, traMADol   Vital Signs    Vitals:   11/15/20 0000 11/15/20 0222 11/15/20 0421 11/15/20 0540  BP: 90/62 102/72 109/71   Pulse: 88 80 93   Resp: (!) 23 12 15    Temp: 98.7 F (37.1 C) 99.1 F (37.3 C) 99.7 F (37.6 C) 98.6 F (37 C)  TempSrc:  Oral Oral Oral  SpO2: 97% 98% 97%   Weight:   82.9 kg   Height:   5\' 9"  (1.753 m)     Intake/Output Summary (Last 24 hours) at 11/15/2020 0716 Last data filed at 11/15/2020 0656 Gross per 24 hour  Intake 1397 ml  Output 1900 ml  Net -503 ml   Last 3 Weights 11/15/2020 11/14/2020 11/13/2020  Weight (lbs) 182 lb 12.2 oz 185 lb 3 oz 186 lb 4.6 oz  Weight (kg) 82.9 kg 84 kg 84.5 kg      Telemetry    NSR in 80s- Personally Reviewed  ECG    N/A- Personally Reviewed  Physical Exam   VS:  BP 109/71 (BP Location: Right Arm)   Pulse 93   Temp 98.6 F (37 C) (Oral)   Resp 15   Ht 5\' 9"  (1.753 m)   Wt 82.9 kg   SpO2 97%   BMI 26.99 kg/m  , BMI Body mass index is  26.99 kg/m. GENERAL:  Well appearing HEENT: Pupils equal round and reactive, fundi not visualized, oral mucosa unremarkable NECK:  No jugular venous distention, waveform within normal limits, carotid upstroke brisk and symmetric, no bruits LUNGS:  Clear to auscultation bilaterally HEART:  Irregularly irregular.  PMI not displaced or sustained,S1 and S2 within normal limits, no S3, no S4, no clicks, no rubs, no murmurs ABD:  Flat, positive bowel sounds normal in frequency in pitch, no bruits, no rebound, no guarding, no midline pulsatile mass, no hepatomegaly, no splenomegaly EXT:  2 plus pulses throughout, no edema, no cyanosis no clubbing SKIN:  No rashes no nodules NEURO:  Cranial nerves II through XII grossly intact, motor grossly intact throughout PSYCH:  Cognitively intact, oriented to person place and time   Labs    High Sensitivity Troponin:  No results for input(s): TROPONINIHS in the last 720 hours.    Chemistry Recent Labs  Lab 11/10/20 1136 11/10/20 1630 11/13/20 0548 11/14/20 0315 11/15/20 0333  NA 118*   < > 129* 131* 133*  K 3.2*   < >  3.4* 3.7 3.6  CL 84*   < > 99 102 103  CO2 21*   < > 20* 21* 22  GLUCOSE 184*   < > 106* 113* 100*  BUN 62*   < > 43* 35* 34*  CREATININE 2.89*   < > 1.88* 1.60* 1.73*  CALCIUM 8.8*   < > 7.8* 7.8* 7.9*  PROT 6.5  --   --   --   --   ALBUMIN 2.4*  --   --   --   --   AST 66*  --   --   --   --   ALT 46*  --   --   --   --   ALKPHOS 92  --   --   --   --   BILITOT 0.7  --   --   --   --   GFRNONAA 22*   < > 37* 45* 41*  ANIONGAP 13   < > 10 8 8    < > = values in this interval not displayed.     Hematology Recent Labs  Lab 11/14/20 0315 11/15/20 0123 11/15/20 0333  WBC 7.0 6.2 6.9  RBC 2.24* 2.55* 2.72*  HGB 7.2* 8.0* 8.4*  HCT 20.6* 23.1* 24.6*  MCV 92.0 90.6 90.4  MCH 32.1 31.4 30.9  MCHC 35.0 34.6 34.1  RDW 16.3* 16.2* 16.3*  PLT 101* 133* 150    BNPNo results for input(s): BNP, PROBNP in the last 168 hours.     DDimer No results for input(s): DDIMER in the last 168 hours.   Radiology    DG HIP UNILAT WITH PELVIS 2-3 VIEWS RIGHT  Result Date: 11/14/2020 CLINICAL DATA:  Right hip pain since being in hospital over the last 4 days. No prior injuries. EXAM: DG HIP (WITH OR WITHOUT PELVIS) 2-3V RIGHT COMPARISON:  None. FINDINGS: There is no evidence of hip fracture or dislocation. There is no evidence of arthropathy or other focal bone abnormality. IMPRESSION: Negative. Electronically Signed   By: Dorise Bullion III M.D   On: 11/14/2020 14:25    Cardiac Studies   Echo 11/11/2020: IMPRESSIONS   1. Left ventricular ejection fraction, by estimation, is 50%. The left  ventricle has low normal function with beat to beat variability. The left  ventricle has no regional wall motion abnormalities. There is mild left  ventricular hypertrophy. Left  ventricular diastolic parameters are indeterminate.  2. Right ventricular systolic function is normal. The right ventricular  size is normal. Tricuspid regurgitation signal is inadequate for assessing  PA pressure.  3. The mitral valve is normal in structure. Trivial mitral valve  regurgitation. No evidence of mitral stenosis.  4. The aortic valve is normal in structure. Aortic valve regurgitation is  not visualized. No aortic stenosis is present.   Patient Profile     Hunter [name] is a 24M with bladder cancer on gemcitabine/cisplatin, CAD s/p CABG, post-op atrial fibrillation, hypertension, hyperlipidemia, CKD III and GERD admitted with profound hyponatremia, intravascular volume depletion and atrial fibrillation with RVR.  Assessment & Plan    Atrial fibrillation with rapid ventricular response: thought to be due to profound metabolic arrangement with hyponatremia and intravascular volume depletion.  He is currently back in sinus rhythm. Echo 11/16 showed EF 50%, normal RV function, no significant valvular disease.  -Unable to tolerate  anticoagulation due to hematuria -Currently in sinus rhythm.  Previous intolerance of amiodarone.  Continue metoprolol 50 mg twice daily.  BP soft, would  recommend stopping diltiazem, can titrate up metoprolol if needed for rate control  CAD s/p CABG: Maintain hbg >8.  Denies chest pain or shortness of breath. Continue atorvastatin.   AKI on CKD stage IIIa: Baseline creatinine 1.3-1.6. Renal function improved with IV fluids, currently near baseline renal function stable (2.5 > 2.0 >1.9 >1.6 >1.7).  Planning discharge today.  Would discharge on metoprolol 50 mg BID.  Will order Zio patch x 1 week on discharge to determine Afib burden and adequate rate control as he is asymptomatic when in Afib.  Will schedule outpatient f/u.   For questions or updates, please contact [place] Please consult [email or web address] for contact info under        Signed, Donato Heinz, MD  11/15/2020, 7:16 AM

## 2020-11-15 NOTE — Progress Notes (Addendum)
°   11/15/20 0000  Assess: MEWS Score  Temp 98.7 F (37.1 C)  BP 90/62  Pulse Rate 88  ECG Heart Rate 78  Resp (!) 23  Level of Consciousness Alert  SpO2 97 %  O2 Device Room Air  Assess: MEWS Score  MEWS Temp 0  MEWS Systolic 1  MEWS Pulse 0  MEWS RR 1  MEWS LOC 0  MEWS Score 2  MEWS Score Color Yellow  Assess: if the MEWS score is Yellow or Red  Were vital signs taken at a resting state? Yes  Focused Assessment No change from prior assessment  Early Detection of Sepsis Score *See Row Information* Low  MEWS guidelines implemented *See Row Information* Yes  Treat  MEWS Interventions Administered scheduled meds/treatments  Take Vital Signs  Increase Vital Sign Frequency  Yellow: Q 2hr X 2 then Q 4hr X 2, if remains yellow, continue Q 4hrs  Escalate  MEWS: Escalate Yellow: discuss with charge nurse/RN and consider discussing with provider and RRT  Notify: Charge Nurse/RN  Name of Charge Nurse/RN Notified Karrie Doffing, RN  Date Charge Nurse/RN Notified 11/15/20  Time Charge Nurse/RN Notified 0030  Charge nurse was notified. Will implement vitals for Yellow MEWS.

## 2020-11-15 NOTE — Progress Notes (Signed)
Patient has been in A-Fib for about 8 hours of the shift. Patient converted back to sinus rhythm. Nurse did a 12 EKG to confirm that patient has indeed converted back to sinus rhythm. Patient has also had two episodes of low-grade fevers. Nurse gave Tylenol twice to help with the low-grade fevers, which had helped bring the patient's temperature back down to normal range.

## 2020-11-15 NOTE — Discharge Summary (Signed)
Physician Discharge Summary  Barnett Elzey TMA:[id number] DOB: 1946/03/30 DOA: 11/10/2020  PCP: [name] E  Admit date: 11/10/2020 Discharge date: 11/15/2020  Admitted From: Home Disposition:  Home  Discharge Condition:Stable CODE STATUS:FULL Diet recommendation: Heart Healthy    Brief/Interim Summary:  Patient is a 74 year old male with history of bladder cancer on gemcitabine/cisplatin, melanoma, coronary artery disease status post CABG in 2013, chronic hyponatremia, hypertension, hyperlipidemia, gout, paroxysmal A. fib not on anticoagulation, recurrent UTIs who was sent from cancer center for direct admission for the evaluation of hyponatremia, AKI.  Patient reported feeling fatigued, dizzy since his most recent chemotherapy, also poor oral intake.  He was noted to have sodium of 118 and creatinine of 2.8 at the cancer center.  On presentation, he was hemodynamically stable, afebrile.  EKG  showed A. fib with RVR, his blood pressure was soft.  Patient was admitted for further evaluation and management.  Nephrology, cardiology were following.  Kidney function improved and currently at baseline.  Hospital course remarkable for A. fib with RVR, most controlled now..  Currently is medically stable for discharge.  He will follow-up with nephrology and cardiology as an outpatient.  Following problems were addressed during his hospitalization:  AKI on CKD stage IIIa: renal US/bladder showed mild left hydronephrosis,left lower pole renal stone.Markedly irregular bladder wall, similar to prior CT. CT abdomen/pelvis on 09/09/2020 showed extensive thickening of the superior urinary bladder wall with multiple small diverticula and extensive fat stranding consistent with bladder malignancy. Patient presumed to be dehydrated on presentation and was started on IV fluids.  AKI could also be from nephrotoxicity from chemotherapy.Now  he is having good urine output. Nephrology following.  Baseline  creatinine ranges from 1.3-1.6.  Kidney function is improving and near baseline.    He will follow-up with nephrology on discharge.  Acute on chronic hyponatremia: Suspected to be from fluid loss. .Baseline sodium around low 130s.  Presented with sodium of 118.  Now near baseline  Paroxysmal A. fib: On RVR on presentation and this mrng also.  He had history of A. fib several years ago and was on Xarelto which has been discontinued.  He was started on heparin drip during this admission which was later discontinued due to hematuria.  Echo showed ejection fraction of 50%. Hospital course remarkable for A. fib with RVR,now on oral  metoprolol.   He will follow-up with cardiology as an outpatient.  History of recurrent UTI:.  Started and finished course of doxycycline.  Multiple organisms as per urine culture  Normocytic anemia: S/p  Transfusion with a unit of PRBC during this hospitalization.  Currently hemoglobin stable in the range of 8.  Follow-up CBC in a week.  Hypertension:  Continue current medications  Bladder cancer: Follows with Dr. Alen Blew.  Recently on cisplatin/gemcitabine.  Complained of some hematuria on 11/11/20 but it has resolved now..  We will continue to monitor.    History of coronary artery disease: Status post CABG in 2013.  On  statin at home.  Generalized weakness:  PT/OT evaluation recommended home health  Decreased oral intake: Started on Megace.  Nutrition was consulted  Right hip pain: x-ray of the right hip did not show fracture dislocation   Discharge Diagnoses:  Principal Problem:   Hyponatremia Active Problems:   Essential hypertension   CAD s/p CABG   Paroxysmal atrial fibrillation (HCC)   Malignant neoplasm of urinary bladder (HCC)   Acute lower UTI   Acute renal failure superimposed on stage 3a chronic kidney  disease ([place])   Sepsis with acute renal failure and septic shock [facility])    Discharge Instructions  Discharge Instructions    Diet  - low sodium heart healthy   Complete by: As directed    Discharge instructions   Complete by: As directed    1)Please follow-up with your PCP in a week.  Do a CBC, BMP test during the follow-up 2)Follow up with cardiology as an outpatient.  You will be called for appointment. 3)Follow up with nephrology.  Name and number the provider has been attached. 4)Take prescribed medications as instructed.   Increase activity slowly   Complete by: As directed      Allergies as of 11/15/2020      Reactions   Amiodarone    fasciculations   Oxycodone Other (See Comments)   Bad dreams      Medication List    STOP taking these medications   carvedilol 6.25 MG tablet Commonly known as: COREG   lidocaine-prilocaine cream Commonly known as: EMLA   mirabegron ER 25 MG Tb24 tablet Commonly known as: Myrbetriq   sildenafil 20 MG tablet Commonly known as: REVATIO   simvastatin 40 MG tablet Commonly known as: ZOCOR   triamterene-hydrochlorothiazide 37.5-25 MG tablet Commonly known as: MAXZIDE-25     TAKE these medications   acetaminophen 325 MG tablet Commonly known as: TYLENOL Take 650 mg by mouth every 6 (six) hours as needed for mild pain, fever or headache.   allopurinol 300 MG tablet Commonly known as: ZYLOPRIM Take 300 mg by mouth daily.   atorvastatin 20 MG tablet Commonly known as: LIPITOR Take 1 tablet (20 mg total) by mouth daily at 6 PM.   megestrol 400 MG/10ML suspension Commonly known as: MEGACE Take 10 mLs (400 mg total) by mouth daily. Start taking on: November 16, 2020   metoprolol tartrate 50 MG tablet Commonly known as: LOPRESSOR Take 1 tablet (50 mg total) by mouth 2 (two) times daily.   omeprazole 20 MG capsule Commonly known as: PRILOSEC Take 20 mg by mouth every Monday, Wednesday, and Friday.   polyethylene glycol 17 g packet Commonly known as: MIRALAX / GLYCOLAX Take 17 g by mouth daily as needed for mild constipation.   prochlorperazine 10 MG  tablet Commonly known as: COMPAZINE Take 1 tablet (10 mg total) by mouth every 6 (six) hours as needed for nausea or vomiting.   tamsulosin 0.4 MG Caps capsule Commonly known as: FLOMAX Take 1 capsule (0.4 mg total) by mouth daily.       Follow-up Information    Gean Quint, MD Follow up on 12/01/2020.   Specialty: Nephrology Why: follow up appt with kidney doctor on monday Dec 6th, please arrive at 10:30 am for 11am appt Contact information: [place] [address] [place] Follow up.   Why: [facility] physical therapy/occupational therapy Contact information: [place] [address] [place], [place] Schedule an appointment as soon as possible for a visit in 1 week(s).   Specialty: Nurse Practitioner Contact information: [address] [place] [address] [phone]        Sanda Klein, MD .   Specialty: Cardiology Contact information: [address] [place]  [address] [phone]              Allergies  Allergen Reactions  . Amiodarone     fasciculations  .  Oxycodone Other (See Comments)    Bad dreams    Consultations:  Cardiology,nephrology   Procedures/Studies: US RENAL  Result Date: 11/10/2020 CLINICAL DATA:  Acute renal failure, chronic kidney disease EXAM: RENAL / URINARY TRACT ULTRASOUND COMPLETE COMPARISON:  CT 09/09/2020 FINDINGS: Right Kidney: Renal measurements: 12.7 x 5.9 x 7.2 cm = volume: 127 mL. Large cysts, the largest in the upper pole measures up to 5.8 cm. Cortical thinning. Normal echotexture. No hydronephrosis. Left Kidney: Renal measurements: 12.8 x 5.5 x 8.2 cm = volume: 229 mL. Left lower pole stone measures 10 mm. Mild hydronephrosis. Normal echotexture. Mild cortical thinning. Bladder: Irregular bladder wall.  Mildly enlarged prostate. Other: None. IMPRESSION: Mild left hydronephrosis.  Left lower pole  renal stone. Right renal cysts appear benign. Markedly irregular bladder wall, similar to prior CT. Prostate enlargement. Electronically Signed   By: Rolm Baptise M.D.   On: 11/10/2020 19:24   ECHOCARDIOGRAM COMPLETE  Result Date: 11/11/2020    ECHOCARDIOGRAM REPORT   Patient Name:   ENRRIQUE MIERZWA Date of Exam: 11/11/2020 Medical Rec #:  [id number]       Height:       69.0 in Accession #:    [id number]      Weight:       178.0 lb Date of Birth:  05/03/1946      BSA:          1.966 m Patient Age:    6 years        BP:           97/62 mmHg Patient Gender: M               HR:           91 bpm. Exam Location:  Inpatient Procedure: 2D Echo, Cardiac Doppler and Color Doppler Indications:    I48.0 Paroxysmal atrial fibrillation  History:        Patient has no prior history of Echocardiogram examinations.                 CAD, Prior CABG; Risk Factors:Hypertension, Dyslipidemia and                 GERD. Cancer.  Sonographer:    Jonelle Sidle Dance Referring Phys: [id number] [place]  1. Left ventricular ejection fraction, by estimation, is 50%. The left ventricle has low normal function with beat to beat variability. The left ventricle has no regional wall motion abnormalities. There is mild left ventricular hypertrophy. Left ventricular diastolic parameters are indeterminate.  2. Right ventricular systolic function is normal. The right ventricular size is normal. Tricuspid regurgitation signal is inadequate for assessing PA pressure.  3. The mitral valve is normal in structure. Trivial mitral valve regurgitation. No evidence of mitral stenosis.  4. The aortic valve is normal in structure. Aortic valve regurgitation is not visualized. No aortic stenosis is present. FINDINGS  Left Ventricle: Left ventricular ejection fraction, by estimation, is 50%. The left ventricle has low normal function. The left ventricle has no regional wall motion abnormalities. The left ventricular internal cavity size was normal in  size. There is mild left ventricular hypertrophy. Left ventricular diastolic parameters are indeterminate. Right Ventricle: The right ventricular size is normal. No increase in right ventricular wall thickness. Right ventricular systolic function is normal. Tricuspid regurgitation signal is inadequate for assessing PA pressure. Left Atrium: Left atrial size was normal in size. Right Atrium: Right atrial size was normal in size. Pericardium: There is no evidence  of pericardial effusion. Mitral Valve: The mitral valve is normal in structure. Trivial mitral valve regurgitation. No evidence of mitral valve stenosis. Tricuspid Valve: The tricuspid valve is normal in structure. Tricuspid valve regurgitation is trivial. No evidence of tricuspid stenosis. Aortic Valve: The aortic valve is normal in structure. Aortic valve regurgitation is not visualized. No aortic stenosis is present. Pulmonic Valve: The pulmonic valve was grossly normal. Pulmonic valve regurgitation is trivial. No evidence of pulmonic stenosis. Aorta: The aortic root is normal in size and structure. Venous: The inferior vena cava was not well visualized. IAS/Shunts: No atrial level shunt detected by color flow Doppler.  LEFT VENTRICLE PLAX 2D LVIDd:         4.20 cm LVIDs:         3.30 cm LV PW:         1.50 cm LV IVS:        1.10 cm LVOT diam:     2.30 cm LV SV:         60 LV SV Index:   31 LVOT Area:     4.15 cm  RIGHT VENTRICLE          IVC RV Basal diam:  2.80 cm  IVC diam: 1.10 cm TAPSE (M-mode): 1.4 cm LEFT ATRIUM             Index       RIGHT ATRIUM           Index LA diam:        4.40 cm 2.24 cm/m  RA Area:     18.90 cm LA Vol (A2C):   54.0 ml 27.46 ml/m RA Volume:   50.70 ml  25.79 ml/m LA Vol (A4C):   42.7 ml 21.72 ml/m LA Biplane Vol: 48.7 ml 24.77 ml/m  AORTIC VALVE LVOT Vmax:   70.60 cm/s LVOT Vmean:  53.100 cm/s LVOT VTI:    0.145 m  AORTA Ao Root diam: 3.60 cm Ao Asc diam:  3.40 cm MITRAL VALVE MV Area (PHT): 2.78 cm   SHUNTS MV Decel  Time: 273 msec   Systemic VTI:  0.14 m MV E velocity: 0.77 cm/s  Systemic Diam: 2.30 cm Cherlynn Kaiser MD Electronically signed by Cherlynn Kaiser MD Signature Date/Time: 11/11/2020/5:18:06 PM    Final    DG HIP UNILAT WITH PELVIS 2-3 VIEWS RIGHT  Result Date: 11/14/2020 CLINICAL DATA:  Right hip pain since being in hospital over the last 4 days. No prior injuries. EXAM: DG HIP (WITH OR WITHOUT PELVIS) 2-3V RIGHT COMPARISON:  None. FINDINGS: There is no evidence of hip fracture or dislocation. There is no evidence of arthropathy or other focal bone abnormality. IMPRESSION: Negative. Electronically Signed   By: Dorise Bullion III M.D   On: 11/14/2020 14:25       Subjective: Patient seen and examined at the bedside this morning.  hemodynamically  stable.  Currently is normal sinus rhythm.  Stable for discharge  Discharge Exam: Vitals:   11/15/20 0934 11/15/20 0948  BP:  110/65  Pulse:  80  Resp:    Temp: 97.9 F (36.6 C)   SpO2:     Vitals:   11/15/20 0421 11/15/20 0540 11/15/20 0934 11/15/20 0948  BP: 109/71   110/65  Pulse: 93   80  Resp: 15     Temp: 99.7 F (37.6 C) 98.6 F (37 C) 97.9 F (36.6 C)   TempSrc: Oral Oral Oral   SpO2: 97%     Weight: 82.9  kg     Height: 5\' 9"  (1.753 m)       General: Pt is alert, awake, not in acute distress Cardiovascular: RRR, S1/S2 +, no rubs, no gallops Respiratory: CTA bilaterally, no wheezing, no rhonchi Abdominal: Soft, NT, ND, bowel sounds + Extremities: no edema, no cyanosis    The results of significant diagnostics from this hospitalization (including imaging, microbiology, ancillary and laboratory) are listed below for reference.     Microbiology: Recent Results (from the past 240 hour(s))  Urine Culture     Status: Abnormal   Collection Time: 11/06/20  4:11 PM   Specimen: Urine, Clean Catch  Result Value Ref Range Status   Specimen Description   Final    URINE, CLEAN CATCH Performed at [facility], [address] [place], [place] [address]    Special Requests   Final    NONE Performed at [facility], [place] [address], [place], [place] [address]    Culture 80,000 COLONIES/mL STAPHYLOCOCCUS EPIDERMIDIS (A)  Final   Report Status 11/08/2020 FINAL  Final   Organism ID, Bacteria STAPHYLOCOCCUS EPIDERMIDIS (A)  Final      Susceptibility   Staphylococcus epidermidis - MIC*    CIPROFLOXACIN <=0.5 SENSITIVE Sensitive     GENTAMICIN <=0.5 SENSITIVE Sensitive     NITROFURANTOIN <=16 SENSITIVE Sensitive     OXACILLIN >=4 RESISTANT Resistant     TETRACYCLINE 2 SENSITIVE Sensitive     VANCOMYCIN 2 SENSITIVE Sensitive     TRIMETH/SULFA 160 RESISTANT Resistant     CLINDAMYCIN <=0.25 SENSITIVE Sensitive     RIFAMPIN <=0.5 SENSITIVE Sensitive     Inducible Clindamycin NEGATIVE Sensitive     * 80,000 COLONIES/mL STAPHYLOCOCCUS EPIDERMIDIS  Urine Culture     Status: Abnormal   Collection Time: 11/10/20 11:48 AM   Specimen: Urine, Clean Catch  Result Value Ref Range Status   Specimen Description   Final    URINE, CLEAN CATCH Performed at [facility], [place] [address] [place], [place] [address]    Special Requests   Final    NONE Performed at [facility], [place] [address] [place], [place] [address]    Culture >=100,000 COLONIES/mL STAPHYLOCOCCUS EPIDERMIDIS (A)  Final   Report Status 11/12/2020 FINAL  Final   Organism ID, Bacteria STAPHYLOCOCCUS EPIDERMIDIS (A)  Final      Susceptibility   Staphylococcus epidermidis - MIC*    CIPROFLOXACIN <=0.5 SENSITIVE Sensitive     GENTAMICIN <=0.5 SENSITIVE Sensitive     NITROFURANTOIN <=16 SENSITIVE Sensitive     OXACILLIN >=4 RESISTANT Resistant     TETRACYCLINE 2 SENSITIVE Sensitive     VANCOMYCIN 1 SENSITIVE Sensitive     TRIMETH/SULFA 160 RESISTANT Resistant     CLINDAMYCIN <=0.25 SENSITIVE Sensitive     RIFAMPIN <=0.5 SENSITIVE Sensitive     Inducible  Clindamycin NEGATIVE Sensitive     * >=100,000 COLONIES/mL STAPHYLOCOCCUS EPIDERMIDIS  Respiratory Panel by RT PCR (Flu A&B, Covid) - Nasopharyngeal Swab     Status: None   Collection Time: 11/10/20  3:48 PM   Specimen: Nasopharyngeal Swab  Result Value Ref Range Status   SARS Coronavirus 2 by RT PCR NEGATIVE NEGATIVE Final    Comment: (NOTE) SARS-CoV-2 target nucleic acids are NOT DETECTED.  The SARS-CoV-2 RNA is generally detectable in upper respiratoy specimens during the acute phase of infection. The lowest concentration of SARS-CoV-2 viral copies this assay can detect is 131 copies/mL.  A negative result does not preclude SARS-Cov-2 infection and should not be used as the sole basis for treatment or other patient management decisions. A negative result may occur with  improper specimen collection/handling, submission of specimen other than nasopharyngeal swab, presence of viral mutation(s) within the areas targeted by this assay, and inadequate number of viral copies (<131 copies/mL). A negative result must be combined with clinical observations, patient history, and epidemiological information. The expected result is Negative.  Fact Sheet for Patients:  PinkCheek.be  Fact Sheet for Healthcare Providers:  GravelBags.it  This test is no t yet approved or cleared by the [place] FDA and  has been authorized for detection and/or diagnosis of SARS-CoV-2 by FDA under an Emergency Use Authorization (EUA). This EUA will remain  in effect (meaning this test can be used) for the duration of the COVID-19 declaration under Section 564(b)(1) of the Act, 21 U.S.C. section 360bbb-3(b)(1), unless the authorization is terminated or revoked sooner.     Influenza A by PCR NEGATIVE NEGATIVE Final   Influenza B by PCR NEGATIVE NEGATIVE Final    Comment: (NOTE) The Xpert Xpress SARS-CoV-2/FLU/RSV assay is intended as an aid in   the diagnosis of influenza from Nasopharyngeal swab specimens and  should not be used as a sole basis for treatment. Nasal washings and  aspirates are unacceptable for Xpert Xpress SARS-CoV-2/FLU/RSV  testing.  Fact Sheet for Patients: PinkCheek.be  Fact Sheet for Healthcare Providers: GravelBags.it  This test is not yet approved or cleared by the [place] FDA and  has been authorized for detection and/or diagnosis of SARS-CoV-2 by  FDA under an Emergency Use Authorization (EUA). This EUA will remain  in effect (meaning this test can be used) for the duration of the  Covid-19 declaration under Section 564(b)(1) of the Act, 21  U.S.C. section 360bbb-3(b)(1), unless the authorization is  terminated or revoked. Performed at [facility], [place] [address] [place], [place] [address]   Culture, Urine     Status: Abnormal   Collection Time: 11/11/20  5:14 PM   Specimen: Urine, Clean Catch  Result Value Ref Range Status   Specimen Description   Final    URINE, CLEAN CATCH Performed at [facility], [place] [address] [place], [place] [address]    Special Requests   Final    NONE Performed at [facility], [place] [address], [place], [place] [address]    Culture MULTIPLE SPECIES PRESENT, SUGGEST RECOLLECTION (A)  Final   Report Status 11/13/2020 FINAL  Final     Labs: BNP (last 3 results) No results for input(s): BNP in the last 8760 hours. Basic Metabolic Panel: Recent Labs  Lab 11/10/20 1630 11/10/20 2125 11/11/20 0616 11/12/20 0504 11/13/20 0548 11/14/20 0315 11/15/20 0333  NA 121*   < > 124* 129* 129* 131* 133*  K 4.0   < > 3.0* 3.7 3.4* 3.7 3.6  CL 87*   < > 93* 102 99 102 103  CO2 22   < > 21* 20* 20* 21* 22  GLUCOSE 145*   < > 162* 117* 106* 113* 100*  BUN 63*   < > 61* 57* 43* 35* 34*  CREATININE 2.86*   < > 2.46* 2.03* 1.88* 1.60* 1.73*  CALCIUM  8.3*   < > 7.7* 7.9* 7.8* 7.8* 7.9*  MG 2.2  --   --   --   --   --   --    < > =  values in this interval not displayed.   Liver Function Tests: Recent Labs  Lab 11/10/20 1136  AST 66*  ALT 46*  ALKPHOS 92  BILITOT 0.7  PROT 6.5  ALBUMIN 2.4*   No results for input(s): LIPASE, AMYLASE in the last 168 hours. No results for input(s): AMMONIA in the last 168 hours. CBC: Recent Labs  Lab 11/10/20 1136 11/11/20 0616 11/12/20 0504 11/13/20 0548 11/14/20 0315 11/15/20 0123 11/15/20 0333  WBC 2.6*   < > 4.2 5.9 7.0 6.2 6.9  NEUTROABS 2.2  --   --   --   --   --   --   HGB 9.6*   < > 7.6* 7.4* 7.2* 8.0* 8.4*  HCT 26.6*   < > 21.8* 21.2* 20.6* 23.1* 24.6*  MCV 86.9   < > 90.8 91.8 92.0 90.6 90.4  PLT 84*   < > 56* 69* 101* 133* 150   < > = values in this interval not displayed.   Cardiac Enzymes: No results for input(s): CKTOTAL, CKMB, CKMBINDEX, TROPONINI in the last 168 hours. BNP: Invalid input(s): POCBNP CBG: No results for input(s): GLUCAP in the last 168 hours. D-Dimer No results for input(s): DDIMER in the last 72 hours. Hgb A1c No results for input(s): HGBA1C in the last 72 hours. Lipid Profile No results for input(s): CHOL, HDL, LDLCALC, TRIG, CHOLHDL, LDLDIRECT in the last 72 hours. Thyroid function studies No results for input(s): TSH, T4TOTAL, T3FREE, THYROIDAB in the last 72 hours.  Invalid input(s): FREET3 Anemia work up No results for input(s): VITAMINB12, FOLATE, FERRITIN, TIBC, IRON, RETICCTPCT in the last 72 hours. Urinalysis    Component Value Date/Time   COLORURINE YELLOW 11/10/2020 1148   APPEARANCEUR CLOUDY (A) 11/10/2020 1148   LABSPEC 1.009 11/10/2020 1148   PHURINE 6.0 11/10/2020 1148   GLUCOSEU NEGATIVE 11/10/2020 1148   HGBUR MODERATE (A) 11/10/2020 1148   BILIRUBINUR NEGATIVE 11/10/2020 1148   KETONESUR NEGATIVE 11/10/2020 1148   PROTEINUR 100 (A) 11/10/2020 1148   UROBILINOGEN 0.2 12/08/2012 1626   NITRITE NEGATIVE 11/10/2020 1148    LEUKOCYTESUR LARGE (A) 11/10/2020 1148   Sepsis Labs Invalid input(s): PROCALCITONIN,  WBC,  LACTICIDVEN Microbiology Recent Results (from the past 240 hour(s))  Urine Culture     Status: Abnormal   Collection Time: 11/06/20  4:11 PM   Specimen: Urine, Clean Catch  Result Value Ref Range Status   Specimen Description   Final    URINE, CLEAN CATCH Performed at [facility], [place] [address], [place], [place] [address]    Special Requests   Final    NONE Performed at [facility], [place] [address] [place], [place] [address]    Culture 80,000 COLONIES/mL STAPHYLOCOCCUS EPIDERMIDIS (A)  Final   Report Status 11/08/2020 FINAL  Final   Organism ID, Bacteria STAPHYLOCOCCUS EPIDERMIDIS (A)  Final      Susceptibility   Staphylococcus epidermidis - MIC*    CIPROFLOXACIN <=0.5 SENSITIVE Sensitive     GENTAMICIN <=0.5 SENSITIVE Sensitive     NITROFURANTOIN <=16 SENSITIVE Sensitive     OXACILLIN >=4 RESISTANT Resistant     TETRACYCLINE 2 SENSITIVE Sensitive     VANCOMYCIN 2 SENSITIVE Sensitive     TRIMETH/SULFA 160 RESISTANT Resistant     CLINDAMYCIN <=0.25 SENSITIVE Sensitive     RIFAMPIN <=0.5 SENSITIVE Sensitive     Inducible Clindamycin NEGATIVE Sensitive     * 80,000 COLONIES/mL STAPHYLOCOCCUS EPIDERMIDIS  Urine Culture  Status: Abnormal   Collection Time: 11/10/20 11:48 AM   Specimen: Urine, Clean Catch  Result Value Ref Range Status   Specimen Description   Final    URINE, CLEAN CATCH Performed at [facility], [place] [address] [place] [address], [place], [place] [address]    Special Requests   Final    NONE Performed at [facility], [place] [address] [place], [place] [address]    Culture >=100,000 COLONIES/mL STAPHYLOCOCCUS EPIDERMIDIS (A)  Final   Report Status 11/12/2020 FINAL  Final   Organism ID, Bacteria STAPHYLOCOCCUS EPIDERMIDIS (A)  Final      Susceptibility   Staphylococcus  epidermidis - MIC*    CIPROFLOXACIN <=0.5 SENSITIVE Sensitive     GENTAMICIN <=0.5 SENSITIVE Sensitive     NITROFURANTOIN <=16 SENSITIVE Sensitive     OXACILLIN >=4 RESISTANT Resistant     TETRACYCLINE 2 SENSITIVE Sensitive     VANCOMYCIN 1 SENSITIVE Sensitive     TRIMETH/SULFA 160 RESISTANT Resistant     CLINDAMYCIN <=0.25 SENSITIVE Sensitive     RIFAMPIN <=0.5 SENSITIVE Sensitive     Inducible Clindamycin NEGATIVE Sensitive     * >=100,000 COLONIES/mL STAPHYLOCOCCUS EPIDERMIDIS  Respiratory Panel by RT PCR (Flu A&B, Covid) - Nasopharyngeal Swab     Status: None   Collection Time: 11/10/20  3:48 PM   Specimen: Nasopharyngeal Swab  Result Value Ref Range Status   SARS Coronavirus 2 by RT PCR NEGATIVE NEGATIVE Final    Comment: (NOTE) SARS-CoV-2 target nucleic acids are NOT DETECTED.  The SARS-CoV-2 RNA is generally detectable in upper respiratoy specimens during the acute phase of infection. The lowest concentration of SARS-CoV-2 viral copies this assay can detect is 131 copies/mL. A negative result does not preclude SARS-Cov-2 infection and should not be used as the sole basis for treatment or other patient management decisions. A negative result may occur with  improper specimen collection/handling, submission of specimen other than nasopharyngeal swab, presence of viral mutation(s) within the areas targeted by this assay, and inadequate number of viral copies (<131 copies/mL). A negative result must be combined with clinical observations, patient history, and epidemiological information. The expected result is Negative.  Fact Sheet for Patients:  PinkCheek.be  Fact Sheet for Healthcare Providers:  GravelBags.it  This test is no t yet approved or cleared by the [place] FDA and  has been authorized for detection and/or diagnosis of SARS-CoV-2 by FDA under an Emergency Use Authorization (EUA). This EUA will remain   in effect (meaning this test can be used) for the duration of the COVID-19 declaration under Section 564(b)(1) of the Act, 21 U.S.C. section 360bbb-3(b)(1), unless the authorization is terminated or revoked sooner.     Influenza A by PCR NEGATIVE NEGATIVE Final   Influenza B by PCR NEGATIVE NEGATIVE Final    Comment: (NOTE) The Xpert Xpress SARS-CoV-2/FLU/RSV assay is intended as an aid in  the diagnosis of influenza from Nasopharyngeal swab specimens and  should not be used as a sole basis for treatment. Nasal washings and  aspirates are unacceptable for Xpert Xpress SARS-CoV-2/FLU/RSV  testing.  Fact Sheet for Patients: PinkCheek.be  Fact Sheet for Healthcare Providers: GravelBags.it  This test is not yet approved or cleared by the [place] FDA and  has been authorized for detection and/or diagnosis of SARS-CoV-2 by  FDA under an Emergency Use Authorization (EUA). This EUA will remain  in effect (meaning this test can be used) for the duration of the  Covid-19 declaration  under Section 564(b)(1) of the Act, 21  U.S.C. section 360bbb-3(b)(1), unless the authorization is  terminated or revoked. Performed at [facility], [place] [address], [place], [place] [address]   Culture, Urine     Status: Abnormal   Collection Time: 11/11/20  5:14 PM   Specimen: Urine, Clean Catch  Result Value Ref Range Status   Specimen Description   Final    URINE, CLEAN CATCH Performed at [facility], [place] [address], [place], [place] [address]    Special Requests   Final    NONE Performed at [facility], [place] [address], [place], [place] [address]    Culture MULTIPLE SPECIES PRESENT, SUGGEST RECOLLECTION (A)  Final   Report Status 11/13/2020 FINAL  Final    Please note: You were cared for by a hospitalist during your hospital stay. Once you are discharged, your primary care  physician will handle any further medical issues. Please note that NO REFILLS for any discharge medications will be authorized once you are discharged, as it is imperative that you return to your primary care physician (or establish a relationship with a primary care physician if you do not have one) for your post hospital discharge needs so that they can reassess your need for medications and monitor your lab values.    Time coordinating discharge: 40 minutes  SIGNED:   Shelly Coss, MD  [facility] 11/15/2020, 10:43 AM Pager [phone]  If 7PM-7AM, please contact night-coverage [email or web address] Password [email or web address]

## 2020-11-15 NOTE — TOC Progression Note (Signed)
Transition of Care [facility]) - Progression Note    Patient Details  Name: Hunter [name] MRN: [id number] Date of Birth: 03-08-1946  [facility] CM/SW Contact  Joaquin Courts, RN Phone Number: 11/15/2020, 12:34 PM  Clinical Narrative:    DC summary faxed to [facility] in [place].  Expected Discharge Plan: [place] Barriers to Discharge: No Barriers Identified  Expected Discharge Plan and Services Expected Discharge Plan: [place]   Discharge Planning Services: CM Consult Post Acute Care Choice: [place] arrangements for the past 2 months: Single Family Home Expected Discharge Date: 11/15/20                         HH Arranged: PT, OT HH Agency: [facility] Date [place]: 11/14/20 Time [place]: [id number] Representative spoke with at [place]: [place] ([place]) Interventions    Readmission Risk Interventions No flowsheet data found.

## 2020-11-15 NOTE — Progress Notes (Signed)
Patient had a blood pressure of 87/50. Patient was asymptomatic. Nurse notified MD on-call. MD on-call gave orders to give 500 mL Normal Saline bolus and a STAT CBC.

## 2020-11-18 ENCOUNTER — Ambulatory Visit: Payer: Medicare Other

## 2020-11-18 ENCOUNTER — Other Ambulatory Visit: Payer: Medicare Other

## 2020-11-18 ENCOUNTER — Other Ambulatory Visit: Payer: Self-pay | Admitting: *Deleted

## 2020-11-18 ENCOUNTER — Other Ambulatory Visit: Payer: Self-pay | Admitting: Oncology

## 2020-11-18 ENCOUNTER — Ambulatory Visit: Payer: Medicare Other | Admitting: Oncology

## 2020-11-18 DIAGNOSIS — C679 Malignant neoplasm of bladder, unspecified: Secondary | ICD-10-CM

## 2020-11-18 MED FILL — Fosaprepitant Dimeglumine For IV Infusion 150 MG (Base Eq): INTRAVENOUS | Qty: 5 | Status: AC

## 2020-11-18 MED FILL — Dexamethasone Sodium Phosphate Inj 100 MG/10ML: INTRAMUSCULAR | Qty: 1 | Status: AC

## 2020-11-19 ENCOUNTER — Inpatient Hospital Stay: Payer: Medicare Other

## 2020-11-19 ENCOUNTER — Inpatient Hospital Stay (HOSPITAL_BASED_OUTPATIENT_CLINIC_OR_DEPARTMENT_OTHER): Payer: Medicare Other | Admitting: Oncology

## 2020-11-19 ENCOUNTER — Other Ambulatory Visit: Payer: Self-pay

## 2020-11-19 ENCOUNTER — Other Ambulatory Visit: Payer: Self-pay | Admitting: *Deleted

## 2020-11-19 VITALS — BP 118/70 | HR 98 | Temp 98.3°F | Resp 18 | Ht 69.0 in | Wt 179.5 lb

## 2020-11-19 DIAGNOSIS — D630 Anemia in neoplastic disease: Secondary | ICD-10-CM | POA: Diagnosis not present

## 2020-11-19 DIAGNOSIS — C679 Malignant neoplasm of bladder, unspecified: Secondary | ICD-10-CM

## 2020-11-19 DIAGNOSIS — D6481 Anemia due to antineoplastic chemotherapy: Secondary | ICD-10-CM | POA: Diagnosis not present

## 2020-11-19 DIAGNOSIS — N390 Urinary tract infection, site not specified: Secondary | ICD-10-CM | POA: Diagnosis not present

## 2020-11-19 DIAGNOSIS — D709 Neutropenia, unspecified: Secondary | ICD-10-CM | POA: Diagnosis not present

## 2020-11-19 DIAGNOSIS — T451X5A Adverse effect of antineoplastic and immunosuppressive drugs, initial encounter: Secondary | ICD-10-CM | POA: Diagnosis not present

## 2020-11-19 DIAGNOSIS — Z95828 Presence of other vascular implants and grafts: Secondary | ICD-10-CM

## 2020-11-19 DIAGNOSIS — Z79899 Other long term (current) drug therapy: Secondary | ICD-10-CM | POA: Diagnosis not present

## 2020-11-19 DIAGNOSIS — Z5111 Encounter for antineoplastic chemotherapy: Secondary | ICD-10-CM | POA: Diagnosis present

## 2020-11-19 DIAGNOSIS — R110 Nausea: Secondary | ICD-10-CM | POA: Diagnosis not present

## 2020-11-19 DIAGNOSIS — N179 Acute kidney failure, unspecified: Secondary | ICD-10-CM | POA: Diagnosis not present

## 2020-11-19 DIAGNOSIS — I2510 Atherosclerotic heart disease of native coronary artery without angina pectoris: Secondary | ICD-10-CM | POA: Diagnosis not present

## 2020-11-19 DIAGNOSIS — I959 Hypotension, unspecified: Secondary | ICD-10-CM | POA: Diagnosis not present

## 2020-11-19 LAB — CMP (CANCER CENTER ONLY)
ALT: 27 U/L (ref 0–44)
AST: 24 U/L (ref 15–41)
Albumin: 2.1 g/dL — ABNORMAL LOW (ref 3.5–5.0)
Alkaline Phosphatase: 76 U/L (ref 38–126)
Anion gap: 8 (ref 5–15)
BUN: 36 mg/dL — ABNORMAL HIGH (ref 8–23)
CO2: 20 mmol/L — ABNORMAL LOW (ref 22–32)
Calcium: 8.4 mg/dL — ABNORMAL LOW (ref 8.9–10.3)
Chloride: 109 mmol/L (ref 98–111)
Creatinine: 2.2 mg/dL — ABNORMAL HIGH (ref 0.61–1.24)
GFR, Estimated: 31 mL/min — ABNORMAL LOW (ref >60)
Glucose, Bld: 182 mg/dL — ABNORMAL HIGH (ref 70–99)
Potassium: 3.6 mmol/L (ref 3.5–5.1)
Sodium: 137 mmol/L (ref 135–145)
Total Bilirubin: 0.7 mg/dL (ref 0.3–1.2)
Total Protein: 5.7 g/dL — ABNORMAL LOW (ref 6.5–8.1)

## 2020-11-19 LAB — CBC WITH DIFFERENTIAL (CANCER CENTER ONLY)
Abs Immature Granulocytes: 0.09 10*3/uL — ABNORMAL HIGH (ref 0.00–0.07)
Basophils Absolute: 0 10*3/uL (ref 0.0–0.1)
Basophils Relative: 0 %
Eosinophils Absolute: 0 10*3/uL (ref 0.0–0.5)
Eosinophils Relative: 0 %
HCT: 25.2 % — ABNORMAL LOW (ref 39.0–52.0)
Hemoglobin: 8.5 g/dL — ABNORMAL LOW (ref 13.0–17.0)
Immature Granulocytes: 1 %
Lymphocytes Relative: 11 %
Lymphs Abs: 0.7 10*3/uL (ref 0.7–4.0)
MCH: 31 pg (ref 26.0–34.0)
MCHC: 33.7 g/dL (ref 30.0–36.0)
MCV: 92 fL (ref 80.0–100.0)
Monocytes Absolute: 1 10*3/uL (ref 0.1–1.0)
Monocytes Relative: 16 %
Neutro Abs: 4.6 10*3/uL (ref 1.7–7.7)
Neutrophils Relative %: 72 %
Platelet Count: 298 10*3/uL (ref 150–400)
RBC: 2.74 MIL/uL — ABNORMAL LOW (ref 4.22–5.81)
RDW: 17.4 % — ABNORMAL HIGH (ref 11.5–15.5)
WBC Count: 6.4 10*3/uL (ref 4.0–10.5)
nRBC: 0 % (ref 0.0–0.2)

## 2020-11-19 LAB — URINALYSIS, COMPLETE (UACMP) WITH MICROSCOPIC
Bilirubin Urine: NEGATIVE
Glucose, UA: NEGATIVE mg/dL
Hgb urine dipstick: NEGATIVE
Ketones, ur: NEGATIVE mg/dL
Nitrite: NEGATIVE
Protein, ur: 100 mg/dL — AB
Specific Gravity, Urine: 1.012 (ref 1.005–1.030)
WBC, UA: >50 WBC/hpf — ABNORMAL HIGH (ref 0–5)
pH: 6 (ref 5.0–8.0)

## 2020-11-19 LAB — SAMPLE TO BLOOD BANK

## 2020-11-19 LAB — PREPARE RBC (CROSSMATCH)

## 2020-11-19 MED ORDER — SODIUM CHLORIDE 0.9% FLUSH
10.0000 mL | Freq: Once | INTRAVENOUS | Status: AC
  Administered 2020-11-19: 10 mL
  Filled 2020-11-19: qty 10

## 2020-11-19 MED ORDER — ACETAMINOPHEN 325 MG PO TABS
ORAL_TABLET | ORAL | Status: AC
  Filled 2020-11-19: qty 2

## 2020-11-19 MED ORDER — ACETAMINOPHEN 325 MG PO TABS
650.0000 mg | ORAL_TABLET | Freq: Once | ORAL | Status: AC
  Administered 2020-11-19: 650 mg via ORAL

## 2020-11-19 MED ORDER — DIPHENHYDRAMINE HCL 25 MG PO CAPS
ORAL_CAPSULE | ORAL | Status: AC
  Filled 2020-11-19: qty 1

## 2020-11-19 MED ORDER — SODIUM CHLORIDE 0.9% FLUSH
10.0000 mL | INTRAVENOUS | Status: AC | PRN
  Administered 2020-11-19: 10 mL
  Filled 2020-11-19: qty 10

## 2020-11-19 MED ORDER — SODIUM CHLORIDE 0.9% IV SOLUTION
250.0000 mL | Freq: Once | INTRAVENOUS | Status: AC
  Administered 2020-11-19: 250 mL via INTRAVENOUS
  Filled 2020-11-19: qty 250

## 2020-11-19 MED ORDER — DIPHENHYDRAMINE HCL 25 MG PO CAPS
25.0000 mg | ORAL_CAPSULE | Freq: Once | ORAL | Status: AC
  Administered 2020-11-19: 25 mg via ORAL

## 2020-11-19 MED ORDER — HEPARIN SOD (PORK) LOCK FLUSH 100 UNIT/ML IV SOLN
500.0000 [IU] | Freq: Every day | INTRAVENOUS | Status: AC | PRN
  Administered 2020-11-19: 500 [IU]
  Filled 2020-11-19: qty 5

## 2020-11-19 NOTE — Progress Notes (Signed)
Hematology and Oncology Follow Up Visit  Hunter [name] [id number] 08/24/1946 74 y.o. 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   Prior Therapy:  He is status post TURBT completed in August 2021 which confirmed the presence of muscle invasion.  Current therapy:  Neoadjuvant chemotherapy utilizing cisplatin and gemcitabine started on September 17, 2020.  He is status post 3 cycles of therapy.  Interim History: [name] is here for a return follow-up.  Since the last visit, he completed 3 cycles of chemotherapy and experienced few complications.  He was hospitalized between November 15 and November 20 for acute kidney injury and hyponatremia.  He received aggressive IV hydration and is maintaining good urine output and kidney function returned to baseline.  Upon is discharge his creatinine was 1.73 and sodium of 133.  Since his discharge, he continues to report increased fatigue and tiredness although has improved some.  He denies any nausea vomiting or abdominal pain.  He denies any pathological fractures or bone pain.  He does report dysuria and urinary frequency although no hematuria or fevers.      Medications: Updated on review. Current Outpatient Medications  Medication Sig Dispense Refill  . acetaminophen (TYLENOL) 325 MG tablet Take 650 mg by mouth every 6 (six) hours as needed for mild pain, fever or headache.    . allopurinol (ZYLOPRIM) 300 MG tablet Take 300 mg by mouth daily.    Marland Kitchen atorvastatin (LIPITOR) 20 MG tablet Take 1 tablet (20 mg total) by mouth daily at 6 PM. 30 tablet 1  . megestrol (MEGACE) 400 MG/10ML suspension Take 10 mLs (400 mg total) by mouth daily. 240 mL 1  . metoprolol tartrate (LOPRESSOR) 50 MG tablet Take 1 tablet (50 mg total) by mouth 2 (two) times daily. 60 tablet 1  . omeprazole (PRILOSEC) 20 MG capsule Take  20 mg by mouth every Monday, Wednesday, and Friday.     . polyethylene glycol (MIRALAX / GLYCOLAX) 17 g packet Take 17 g by mouth daily as needed for mild constipation. 14 each 1  . prochlorperazine (COMPAZINE) 10 MG tablet Take 1 tablet (10 mg total) by mouth every 6 (six) hours as needed for nausea or vomiting. 30 tablet 0  . tamsulosin (FLOMAX) 0.4 MG CAPS capsule Take 1 capsule (0.4 mg total) by mouth daily. 30 capsule 1   No current facility-administered medications for this visit.   Facility-Administered Medications Ordered in Other Visits  Medication Dose Route Frequency Provider Last Rate Last Admin  . gemcitabine (GEMZAR) chemo syringe for bladder instillation 2,000 mg  2,000 mg Bladder Instillation Once Robley Fries, MD         Allergies:  Allergies  Allergen Reactions  . Amiodarone     fasciculations  . Oxycodone Other (See Comments)    Bad dreams      Physical Exam:   Blood pressure 118/70, pulse 98, temperature 98.3 F (36.8 C), temperature source Tympanic, resp. rate 18, height 5\' 9"  (1.753 m), weight 179 lb 8 oz (81.4 kg), SpO2 100 %.    ECOG: 1    General appearance: Alert, awake without any distress. Head: Atraumatic without abnormalities Oropharynx: Without any thrush or ulcers. Eyes: No scleral icterus. Lymph nodes: No lymphadenopathy noted in the cervical, supraclavicular, or axillary nodes Heart:regular rate and rhythm, without any murmurs or gallops.   Lung: Clear to auscultation without any rhonchi, wheezes  or dullness to percussion. Abdomin: Soft, nontender without any shifting dullness or ascites. Musculoskeletal: No clubbing or cyanosis. Neurological: No motor or sensory deficits. Skin: No rashes or lesions.       Lab Results: Lab Results  Component Value Date   WBC 6.9 11/15/2020   HGB 8.4 (L) 11/15/2020   HCT 24.6 (L) 11/15/2020   MCV 90.4 11/15/2020   PLT 150 11/15/2020     Chemistry      Component Value Date/Time   NA  133 (L) 11/15/2020 0333   NA 142 08/24/2019 1415   K 3.6 11/15/2020 0333   CL 103 11/15/2020 0333   CO2 22 11/15/2020 0333   BUN 34 (H) 11/15/2020 0333   BUN 36 (H) 08/24/2019 1415   CREATININE 1.73 (H) 11/15/2020 0333   CREATININE 2.89 (H) 11/10/2020 1136   CREATININE 1.25 09/26/2014 0921      Component Value Date/Time   CALCIUM 7.9 (L) 11/15/2020 0333   ALKPHOS 92 11/10/2020 1136   AST 66 (H) 11/10/2020 1136   ALT 46 (H) 11/10/2020 1136   BILITOT 0.7 11/10/2020 1136        Impression and Plan:  74 year old man with:  1.    Bladder cancer diagnosed in August 2021.  He was found to have T2N0 high-grade urothelial carcinoma.     He has completed 3 cycles of chemotherapy utilizing cisplatin and gemcitabine with few complications.  Risks and benefits of proceeding with cycle 4 of chemotherapy were reviewed and I opted against it given his acute renal injury and hyponatremia that could be attributed to cisplatin.  At this time, I recommended repeat imaging studies in the next few weeks to update his staging work-up.  He has no advanced disease at that time consideration for radical cystectomy will be done.  He is agreeable with this plan.   2. IV access: Port-A-Cath will remain in place for the time being.   3. Antiemetics: Antiemetics are available to him without any nausea or vomiting.  4. Renal function surveillance:His kidney function has now returned to baseline function between 40 to 50 cc/min creatinine clearance.  5.  Neutropenia: Resolved at this time.  6. Goals of care:Aggressive measures are warranted given his reasonable performance status and curable malignancy.  7.  Anemia: Multifactorial related to malignancy, hematuria and chemotherapy.  He did receive packed red cell transfusion with improvement in his symptoms.  His hemoglobin today continues to be relatively low and will transfuse 1 unit of packed red cells.   8. Follow-up: In the next 3 to 4  weeks follow his progress.  30  minutes were spent on this visit.  The time was dedicated to reviewing his disease status, discussing treatment options and addressing complication related to his cancer and cancer therapy.   Zola Button, MD 11/24/20218:21 AM

## 2020-11-19 NOTE — Patient Instructions (Signed)

## 2020-11-20 LAB — TYPE AND SCREEN
ABO/RH(D): O POS
Antibody Screen: NEGATIVE
Unit division: 0

## 2020-11-20 LAB — BPAM RBC
Blood Product Expiration Date: 202112252359
ISSUE DATE / TIME: 202111241042
Unit Type and Rh: 5100

## 2020-11-20 LAB — URINE CULTURE: Culture: NO GROWTH

## 2020-11-21 ENCOUNTER — Other Ambulatory Visit (INDEPENDENT_AMBULATORY_CARE_PROVIDER_SITE_OTHER): Payer: Medicare Other

## 2020-11-21 DIAGNOSIS — I480 Paroxysmal atrial fibrillation: Secondary | ICD-10-CM

## 2020-11-24 ENCOUNTER — Telehealth: Payer: Self-pay | Admitting: *Deleted

## 2020-11-24 ENCOUNTER — Other Ambulatory Visit: Payer: Self-pay

## 2020-11-24 ENCOUNTER — Inpatient Hospital Stay (HOSPITAL_BASED_OUTPATIENT_CLINIC_OR_DEPARTMENT_OTHER): Payer: Medicare Other | Admitting: Medical

## 2020-11-24 ENCOUNTER — Other Ambulatory Visit: Payer: Self-pay | Admitting: Emergency Medicine

## 2020-11-24 VITALS — BP 126/82 | HR 103 | Temp 96.9°F | Resp 18 | Ht 69.0 in | Wt 175.5 lb

## 2020-11-24 DIAGNOSIS — C679 Malignant neoplasm of bladder, unspecified: Secondary | ICD-10-CM | POA: Diagnosis not present

## 2020-11-24 DIAGNOSIS — R630 Anorexia: Secondary | ICD-10-CM

## 2020-11-24 DIAGNOSIS — Z95828 Presence of other vascular implants and grafts: Secondary | ICD-10-CM

## 2020-11-24 DIAGNOSIS — Z5111 Encounter for antineoplastic chemotherapy: Secondary | ICD-10-CM | POA: Diagnosis not present

## 2020-11-24 DIAGNOSIS — E871 Hypo-osmolality and hyponatremia: Secondary | ICD-10-CM | POA: Diagnosis not present

## 2020-11-24 DIAGNOSIS — R5383 Other fatigue: Secondary | ICD-10-CM

## 2020-11-24 LAB — CMP (CANCER CENTER ONLY)
ALT: 70 U/L — ABNORMAL HIGH (ref 0–44)
AST: 80 U/L — ABNORMAL HIGH (ref 15–41)
Albumin: 2.1 g/dL — ABNORMAL LOW (ref 3.5–5.0)
Alkaline Phosphatase: 91 U/L (ref 38–126)
Anion gap: 9 (ref 5–15)
BUN: 28 mg/dL — ABNORMAL HIGH (ref 8–23)
CO2: 21 mmol/L — ABNORMAL LOW (ref 22–32)
Calcium: 9.1 mg/dL (ref 8.9–10.3)
Chloride: 108 mmol/L (ref 98–111)
Creatinine: 1.91 mg/dL — ABNORMAL HIGH (ref 0.61–1.24)
GFR, Estimated: 36 mL/min — ABNORMAL LOW (ref >60)
Glucose, Bld: 111 mg/dL — ABNORMAL HIGH (ref 70–99)
Potassium: 3.9 mmol/L (ref 3.5–5.1)
Sodium: 138 mmol/L (ref 135–145)
Total Bilirubin: 0.6 mg/dL (ref 0.3–1.2)
Total Protein: 6.5 g/dL (ref 6.5–8.1)

## 2020-11-24 LAB — CBC WITH DIFFERENTIAL (CANCER CENTER ONLY)
Abs Immature Granulocytes: 0.1 10*3/uL — ABNORMAL HIGH (ref 0.00–0.07)
Basophils Absolute: 0 10*3/uL (ref 0.0–0.1)
Basophils Relative: 1 %
Eosinophils Absolute: 0 10*3/uL (ref 0.0–0.5)
Eosinophils Relative: 0 %
HCT: 31.4 % — ABNORMAL LOW (ref 39.0–52.0)
Hemoglobin: 10.3 g/dL — ABNORMAL LOW (ref 13.0–17.0)
Immature Granulocytes: 1 %
Lymphocytes Relative: 17 %
Lymphs Abs: 1.2 10*3/uL (ref 0.7–4.0)
MCH: 30.2 pg (ref 26.0–34.0)
MCHC: 32.8 g/dL (ref 30.0–36.0)
MCV: 92.1 fL (ref 80.0–100.0)
Monocytes Absolute: 0.8 10*3/uL (ref 0.1–1.0)
Monocytes Relative: 11 %
Neutro Abs: 5 10*3/uL (ref 1.7–7.7)
Neutrophils Relative %: 70 %
Platelet Count: 327 10*3/uL (ref 150–400)
RBC: 3.41 MIL/uL — ABNORMAL LOW (ref 4.22–5.81)
RDW: 15.9 % — ABNORMAL HIGH (ref 11.5–15.5)
WBC Count: 7.2 10*3/uL (ref 4.0–10.5)
nRBC: 0 % (ref 0.0–0.2)

## 2020-11-24 LAB — SAMPLE TO BLOOD BANK

## 2020-11-24 MED ORDER — SODIUM CHLORIDE 0.9% FLUSH
10.0000 mL | Freq: Once | INTRAVENOUS | Status: AC
  Administered 2020-11-24: 10 mL
  Filled 2020-11-24: qty 10

## 2020-11-24 MED ORDER — PREDNISONE 5 MG PO TABS: ORAL_TABLET | ORAL | 0 refills | Status: DC

## 2020-11-24 MED ORDER — HEPARIN SOD (PORK) LOCK FLUSH 100 UNIT/ML IV SOLN
500.0000 [IU] | Freq: Once | INTRAVENOUS | Status: AC
  Administered 2020-11-24: 500 [IU]
  Filled 2020-11-24: qty 5

## 2020-11-24 NOTE — Telephone Encounter (Signed)
Called pt's wife & informed pt can be seen in Symptom Management today @ 2pm & labs at 1:30 pm.  She expressed understanding & appreciation.

## 2020-11-24 NOTE — Telephone Encounter (Signed)
Received call from pt's wife stating that she expected her husband to feel much better after blood transfusion.  She is concerned that his HGB is still low or even dropped.  She denies that he has any SOB but is weak & sleeps a lot.  He has some swelling of his R ankle/foot.  She can be reached at [phone] & can bring him in for labs if needed.  Routed to Dr Alen Blew.

## 2020-11-24 NOTE — Progress Notes (Signed)
These results were released to the patient via MyChart

## 2020-11-24 NOTE — Telephone Encounter (Signed)
He can be seen at symptom management clinic.

## 2020-11-24 NOTE — Patient Instructions (Signed)

## 2020-11-25 ENCOUNTER — Ambulatory Visit: Payer: Medicare Other

## 2020-11-25 ENCOUNTER — Other Ambulatory Visit: Payer: Medicare Other

## 2020-11-25 NOTE — Progress Notes (Signed)
[facility] Progress Note   Hunter [name] [id number] 1946/08/06 74 y.o.  Hunter [name] is managed by Dr. Zola Button  Actively treated with chemotherapy/immunotherapy/hormonal therapy: Status post cycle 3 of cisplatin and gemcitabine completed on 11/05/2020  Last treated: 11/05/2020 (cycle 3, day 8)  Next scheduled appointment with provider: 12/09/2020 with restaging CT scans and labs completed on 12/03/2020.  Assessment: Plan:    Anorexia - Plan: predniSONE (DELTASONE) 5 MG tablet  Port-A-Cath in place - Plan: sodium chloride flush (NS) 0.9 % injection 10 mL, heparin lock flush 100 unit/mL  Malignant neoplasm of urinary bladder, unspecified site (HCC)  Hyponatremia  Other fatigue  Anorexia: The patient was placed on a prednisone taper.  Malignant neoplasm of the urinary bladder: The patient is status post cycle 3, day 8 of cisplatin and gemcitabine which was dosed on 11/05/2020.  He is scheduled to return for labs and restaging CT scans on 12/03/2020 and will see Dr. Alen Blew on 12/09/2020.  Hyponatremia: A chemistry panel today shows that Hunter [name]' sodium level has normalized at 138 today.  Fatigue: A CBC returned today with a hemoglobin of 10.3 and hematocrit of 31.4.  No transfusions are required today.  Please see After Visit Summary for patient specific instructions.  Future Appointments  Date Time Provider [place]  12/03/2020  8:45 AM [facility] None  12/03/2020  9:15 AM [facility] [place] FLUSH [facility] None  12/03/2020 10:00 AM [facility] [place]  12/09/2020  1:00 PM Wyatt Portela, MD [facility] None  12/17/2020  8:15 AM Almyra Deforest, PA [facility]    No orders of the defined types were placed in this encounter.      Subjective:   Patient ID:  Hunter [name] is a 74 y.o. (DOB 24-Feb-1946) male.  Chief Complaint:  Chief Complaint  Patient presents with  . Fatigue    HPI Hunter [name]  is a 74 y.o. male with a diagnosis of a malignant neoplasm of the urinary bladder.  The patient continues to be followed by Dr. Alen Blew and is status post cycle 3, day 8 of cisplatin and gemcitabine which was dosed on 11/05/2020.  He presents to the clinic today with fatigue and mild anorexia.  The patient believes that he is anemic and may require a transfusion today.  He denies chest pain, shortness of breath, nausea, vomiting, diarrhea, fevers, chills, sweats, melena, or bright red blood per rectum.  He is scheduled to return for labs and restaging CT scans on 12/03/2020 and will see Dr. Alen Blew on 12/09/2020.   Medications: I have reviewed the patient's current medications.  Allergies:  Allergies  Allergen Reactions  . Amiodarone     fasciculations  . Oxycodone Other (See Comments)    Bad dreams    Past Medical History:  Diagnosis Date  . 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 Intol amio, was on Sotalol, now on BB  . Bladder cancer ([place]) dx'd 10/2019  . Bursitis of right shoulder   . CAD (coronary artery disease) 11/2012   CABG x 4 using left internal mammary artery and right endovein harvest. (LIMA-LAD; VG-OM1, OM2, VG-PD), EF 55-60% at cath  . Degenerative disc disease, lumbar   . Epigastric pain    Myoview 11/09/12 Showed evidence of reversible ischemia in both the inferior wall & the anteroapical distribution.   . Erectile dysfunction    H/O ED while taking a beta-blocker  . GERD (gastroesophageal reflux disease)   .  Gout   . Hiatal hernia   . Hyperlipidemia   . Hypertension    Systemic HTN. Pt has H/O of ED when taking a beta-blocker. ECHO 11/26/08 Showed no evidence of any significant valvular disease, Estimated EF = 50-60%.  . Kidney stone    " i have  6-7 stones imbedded in my left kidney "   . Mononeuritis of unspecified site   . Nocturia   . NSVT (nonsustained ventricular tachycardia) ([place])   . Obesity   . 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 PAF (paroxysmal atrial fibrillation) (HCC)    Post CABG. Amia intol. Was on Sotalol, now on Lopressor  . 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 Seasonal allergies   . Skin cancer (melanoma) ([place]) 08/04/2015   left side of face   . Skin lesion of face    hx of    Past Surgical History:  Procedure Laterality Date  . CARDIAC CATHETERIZATION     12/08/2012  . COLONOSCOPY    . CORONARY ARTERY BYPASS GRAFT  12/12/2012   CABG x 4 using left internal mammary artery and right endovein harvest. (LIMA-LAD; VG-OM1, OM2, VG-PD)  . EYE SURGERY     had lasik surgery bilaterally 1996  . IR IMAGING GUIDED PORT INSERTION  09/10/2020  . LEFT HEART CATHETERIZATION WITH CORONARY ANGIOGRAM N/A 12/08/2012   Procedure: LEFT HEART CATHETERIZATION WITH CORONARY ANGIOGRAM;  Surgeon: Sanda Klein, MD;  Location: [facility];  Service: Cardiovascular;  Laterality: N/A;  . SHOULDER SURGERY     for effects of polio  . SKIN CANCER EXCISION  2016   left side of face   . TRANSURETHRAL RESECTION OF BLADDER TUMOR N/A 11/16/2019   Procedure: TRANSURETHRAL RESECTION OF BLADDER TUMOR (TURBT) WITH INSTILLATION OF POST OPERATIVE CHEMOTHERAPY;  Surgeon: Kathie Rhodes, MD;  [facility];  Service: Urology;  Laterality: N/A;  . TRANSURETHRAL RESECTION OF BLADDER TUMOR WITH MITOMYCIN-C N/A 08/12/2020   Procedure: TRANSURETHRAL RESECTION OF BLADDER TUMOR WITH GEMCITABINE;  Surgeon: Robley Fries, MD;  [facility];  Service: Urology;  Laterality: N/A;  1 HR  . UPPER GI ENDOSCOPY      Family History  Problem Relation Age of Onset  . CAD Father   . Hypertension Father   . AAA (abdominal aortic aneurysm) Father   . Aneurysm Father        Cause of death  . Heart attack Paternal Grandfather   . Cancer Mother        Colon. Cause of death    Social History   Socioeconomic  History  . Marital status: Married    Spouse name: Not on file  . Number of children: 3  . Years of education: Not on file  . Highest education level: Not on file  Occupational History  . Occupation: Higher education careers adviser (RETIRED)    Employer: [employer]  Tobacco Use  . Smoking status: Never Smoker  . Smokeless tobacco: Never Used  Substance and Sexual Activity  . Alcohol use: Yes    Comment: social, very little amount, tequila  . Drug use: No  . Sexual activity: Not on file  Other Topics Concern  . Not on file  Social History Narrative  . Not on file   Social Determinants of Health   Financial Resource Strain:   . Difficulty of  Paying Living Expenses: Not on file  Food Insecurity:   . Worried About Charity fundraiser in the Last Year: Not on file  . Ran Out of Food in the Last Year: Not on file  Transportation Needs:   . Lack of Transportation (Medical): Not on file  . Lack of Transportation (Non-Medical): Not on file  Physical Activity:   . Days of Exercise per Week: Not on file  . Minutes of Exercise per Session: Not on file  Stress:   . Feeling of Stress : Not on file  Social Connections:   . Frequency of Communication with Friends and Family: Not on file  . Frequency of Social Gatherings with Friends and Family: Not on file  . Attends Religious Services: Not on file  . Active Member of Clubs or Organizations: Not on file  . Attends Archivist Meetings: Not on file  . Marital Status: Not on file  Intimate Partner Violence:   . Fear of Current or Ex-Partner: Not on file  . Emotionally Abused: Not on file  . Physically Abused: Not on file  . Sexually Abused: Not on file    Past Medical History, Surgical history, Social history, and Family history were reviewed and updated as appropriate.   Please see review of systems for further details on the patient's review from today.   Review of Systems:  Review of Systems  Constitutional: Positive for  appetite change and fatigue. Negative for chills, diaphoresis and fever.  HENT: Negative for trouble swallowing and voice change.   Respiratory: Negative for cough, chest tightness, shortness of breath and wheezing.   Cardiovascular: Negative for chest pain and palpitations.  Gastrointestinal: Negative for abdominal pain, anal bleeding, blood in stool, constipation, diarrhea, nausea and vomiting.  Musculoskeletal: Negative for back pain and myalgias.  Neurological: Negative for dizziness, light-headedness and headaches.    Objective:   Physical Exam:  BP 126/82 (BP Location: Right Arm, Patient Position: Sitting)   Pulse (!) 103   Temp (!) 96.9 F (36.1 C) (Tympanic)   Resp 18   Ht 5\' 9"  (1.753 m)   Wt 175 lb 8 oz (79.6 kg)   SpO2 100%   BMI 25.92 kg/m  ECOG: 1  Physical Exam Constitutional:      General: He is not in acute distress.    Appearance: He is not diaphoretic.  HENT:     Head: Normocephalic and atraumatic.  Eyes:     General: No scleral icterus.       Right eye: No discharge.        Left eye: No discharge.  Cardiovascular:     Rate and Rhythm: Normal rate and regular rhythm.     Heart sounds: Normal heart sounds. No murmur heard.  No friction rub. No gallop.   Pulmonary:     Effort: Pulmonary effort is normal. No respiratory distress.     Breath sounds: Normal breath sounds. No wheezing or rales.  Skin:    General: Skin is warm and dry.     Findings: No erythema or rash.  Neurological:     Mental Status: He is alert.     Coordination: Coordination normal.     Gait: Gait normal.  Psychiatric:        Mood and Affect: Mood normal.        Behavior: Behavior normal.        Thought Content: Thought content normal.        Judgment: Judgment normal.  Lab Review:     Component Value Date/Time   NA 138 11/24/2020 1403   NA 142 08/24/2019 1415   K 3.9 11/24/2020 1403   CL 108 11/24/2020 1403   CO2 21 (L) 11/24/2020 1403   GLUCOSE 111 (H) 11/24/2020  1403   BUN 28 (H) 11/24/2020 1403   BUN 36 (H) 08/24/2019 1415   CREATININE 1.91 (H) 11/24/2020 1403   CREATININE 1.25 09/26/2014 0921   CALCIUM 9.1 11/24/2020 1403   PROT 6.5 11/24/2020 1403   ALBUMIN 2.1 (L) 11/24/2020 1403   AST 80 (H) 11/24/2020 1403   ALT 70 (H) 11/24/2020 1403   ALKPHOS 91 11/24/2020 1403   BILITOT 0.6 11/24/2020 1403   GFRNONAA 36 (L) 11/24/2020 1403   GFRAA >60 09/23/2020 1149       Component Value Date/Time   WBC 7.2 11/24/2020 1403   WBC 6.9 11/15/2020 0333   RBC 3.41 (L) 11/24/2020 1403   HGB 10.3 (L) 11/24/2020 1403   HCT 31.4 (L) 11/24/2020 1403   PLT 327 11/24/2020 1403   MCV 92.1 11/24/2020 1403   MCH 30.2 11/24/2020 1403   MCHC 32.8 11/24/2020 1403   RDW 15.9 (H) 11/24/2020 1403   LYMPHSABS 1.2 11/24/2020 1403   MONOABS 0.8 11/24/2020 1403   EOSABS 0.0 11/24/2020 1403   BASOSABS 0.0 11/24/2020 1403   -------------------------------  Imaging from last 24 hours (if applicable):  Radiology interpretation: US RENAL  Result Date: 11/10/2020 CLINICAL DATA:  Acute renal failure, chronic kidney disease EXAM: RENAL / URINARY TRACT ULTRASOUND COMPLETE COMPARISON:  CT 09/09/2020 FINDINGS: Right Kidney: Renal measurements: 12.7 x 5.9 x 7.2 cm = volume: 127 mL. Large cysts, the largest in the upper pole measures up to 5.8 cm. Cortical thinning. Normal echotexture. No hydronephrosis. Left Kidney: Renal measurements: 12.8 x 5.5 x 8.2 cm = volume: 229 mL. Left lower pole stone measures 10 mm. Mild hydronephrosis. Normal echotexture. Mild cortical thinning. Bladder: Irregular bladder wall.  Mildly enlarged prostate. Other: None. IMPRESSION: Mild left hydronephrosis.  Left lower pole renal stone. Right renal cysts appear benign. Markedly irregular bladder wall, similar to prior CT. Prostate enlargement. Electronically Signed   By: Rolm Baptise M.D.   On: 11/10/2020 19:24   ECHOCARDIOGRAM COMPLETE  Result Date: 11/11/2020    ECHOCARDIOGRAM REPORT   Patient  Name:   KHAREE LESESNE Date of Exam: 11/11/2020 Medical Rec #:  [id number]       Height:       69.0 in Accession #:    [id number]      Weight:       178.0 lb Date of Birth:  05/26/1946      BSA:          1.966 m Patient Age:    38 years        BP:           97/62 mmHg Patient Gender: M               HR:           91 bpm. Exam Location:  Inpatient Procedure: 2D Echo, Cardiac Doppler and Color Doppler Indications:    I48.0 Paroxysmal atrial fibrillation  History:        Patient has no prior history of Echocardiogram examinations.                 CAD, Prior CABG; Risk Factors:Hypertension, Dyslipidemia and  GERD. Cancer.  Sonographer:    Jonelle Sidle Dance Referring Phys: [id number] [place]  1. Left ventricular ejection fraction, by estimation, is 50%. The left ventricle has low normal function with beat to beat variability. The left ventricle has no regional wall motion abnormalities. There is mild left ventricular hypertrophy. Left ventricular diastolic parameters are indeterminate.  2. Right ventricular systolic function is normal. The right ventricular size is normal. Tricuspid regurgitation signal is inadequate for assessing PA pressure.  3. The mitral valve is normal in structure. Trivial mitral valve regurgitation. No evidence of mitral stenosis.  4. The aortic valve is normal in structure. Aortic valve regurgitation is not visualized. No aortic stenosis is present. FINDINGS  Left Ventricle: Left ventricular ejection fraction, by estimation, is 50%. The left ventricle has low normal function. The left ventricle has no regional wall motion abnormalities. The left ventricular internal cavity size was normal in size. There is mild left ventricular hypertrophy. Left ventricular diastolic parameters are indeterminate. Right Ventricle: The right ventricular size is normal. No increase in right ventricular wall thickness. Right ventricular systolic function is normal. Tricuspid regurgitation  signal is inadequate for assessing PA pressure. Left Atrium: Left atrial size was normal in size. Right Atrium: Right atrial size was normal in size. Pericardium: There is no evidence of pericardial effusion. Mitral Valve: The mitral valve is normal in structure. Trivial mitral valve regurgitation. No evidence of mitral valve stenosis. Tricuspid Valve: The tricuspid valve is normal in structure. Tricuspid valve regurgitation is trivial. No evidence of tricuspid stenosis. Aortic Valve: The aortic valve is normal in structure. Aortic valve regurgitation is not visualized. No aortic stenosis is present. Pulmonic Valve: The pulmonic valve was grossly normal. Pulmonic valve regurgitation is trivial. No evidence of pulmonic stenosis. Aorta: The aortic root is normal in size and structure. Venous: The inferior vena cava was not well visualized. IAS/Shunts: No atrial level shunt detected by color flow Doppler.  LEFT VENTRICLE PLAX 2D LVIDd:         4.20 cm LVIDs:         3.30 cm LV PW:         1.50 cm LV IVS:        1.10 cm LVOT diam:     2.30 cm LV SV:         60 LV SV Index:   31 LVOT Area:     4.15 cm  RIGHT VENTRICLE          IVC RV Basal diam:  2.80 cm  IVC diam: 1.10 cm TAPSE (M-mode): 1.4 cm LEFT ATRIUM             Index       RIGHT ATRIUM           Index LA diam:        4.40 cm 2.24 cm/m  RA Area:     18.90 cm LA Vol (A2C):   54.0 ml 27.46 ml/m RA Volume:   50.70 ml  25.79 ml/m LA Vol (A4C):   42.7 ml 21.72 ml/m LA Biplane Vol: 48.7 ml 24.77 ml/m  AORTIC VALVE LVOT Vmax:   70.60 cm/s LVOT Vmean:  53.100 cm/s LVOT VTI:    0.145 m  AORTA Ao Root diam: 3.60 cm Ao Asc diam:  3.40 cm MITRAL VALVE MV Area (PHT): 2.78 cm   SHUNTS MV Decel Time: 273 msec   Systemic VTI:  0.14 m MV E velocity: 0.77 cm/s  Systemic Diam: 2.30 cm Nadean Corwin  Margaretann Loveless MD Electronically signed by Cherlynn Kaiser MD Signature Date/Time: 11/11/2020/5:18:06 PM    Final    DG HIP UNILAT WITH PELVIS 2-3 VIEWS RIGHT  Result Date:  11/14/2020 CLINICAL DATA:  Right hip pain since being in hospital over the last 4 days. No prior injuries. EXAM: DG HIP (WITH OR WITHOUT PELVIS) 2-3V RIGHT COMPARISON:  None. FINDINGS: There is no evidence of hip fracture or dislocation. There is no evidence of arthropathy or other focal bone abnormality. IMPRESSION: Negative. Electronically Signed   By: Dorise Bullion III M.D   On: 11/14/2020 14:25

## 2020-11-26 ENCOUNTER — Other Ambulatory Visit: Payer: Medicare Other

## 2020-11-26 ENCOUNTER — Ambulatory Visit: Payer: Medicare Other

## 2020-11-28 ENCOUNTER — Telehealth: Payer: Self-pay | Admitting: Oncology

## 2020-11-28 NOTE — Telephone Encounter (Signed)
Scheduled per los, patient has been called and notified. 

## 2020-12-01 ENCOUNTER — Other Ambulatory Visit: Payer: Self-pay | Admitting: Medical

## 2020-12-01 DIAGNOSIS — C679 Malignant neoplasm of bladder, unspecified: Secondary | ICD-10-CM

## 2020-12-03 ENCOUNTER — Inpatient Hospital Stay: Payer: Medicare Other

## 2020-12-03 ENCOUNTER — Ambulatory Visit (HOSPITAL_COMMUNITY)
Admission: RE | Admit: 2020-12-03 | Discharge: 2020-12-03 | Disposition: A | Discharge status: Home / Self Care | Payer: Medicare Other | Source: Ambulatory Visit | Attending: Oncology | Admitting: Oncology

## 2020-12-03 ENCOUNTER — Other Ambulatory Visit: Payer: Self-pay

## 2020-12-03 ENCOUNTER — Inpatient Hospital Stay: Payer: Medicare Other | Attending: Oncology

## 2020-12-03 DIAGNOSIS — Z95828 Presence of other vascular implants and grafts: Secondary | ICD-10-CM

## 2020-12-03 DIAGNOSIS — C679 Malignant neoplasm of bladder, unspecified: Secondary | ICD-10-CM | POA: Insufficient documentation

## 2020-12-03 DIAGNOSIS — D6481 Anemia due to antineoplastic chemotherapy: Secondary | ICD-10-CM | POA: Insufficient documentation

## 2020-12-03 DIAGNOSIS — D631 Anemia in chronic kidney disease: Secondary | ICD-10-CM | POA: Insufficient documentation

## 2020-12-03 DIAGNOSIS — Z79899 Other long term (current) drug therapy: Secondary | ICD-10-CM | POA: Insufficient documentation

## 2020-12-03 DIAGNOSIS — N189 Chronic kidney disease, unspecified: Secondary | ICD-10-CM | POA: Insufficient documentation

## 2020-12-03 DIAGNOSIS — T451X5A Adverse effect of antineoplastic and immunosuppressive drugs, initial encounter: Secondary | ICD-10-CM | POA: Insufficient documentation

## 2020-12-03 DIAGNOSIS — D630 Anemia in neoplastic disease: Secondary | ICD-10-CM | POA: Insufficient documentation

## 2020-12-03 DIAGNOSIS — N1330 Unspecified hydronephrosis: Secondary | ICD-10-CM | POA: Insufficient documentation

## 2020-12-03 LAB — CBC WITH DIFFERENTIAL (CANCER CENTER ONLY)
Abs Immature Granulocytes: 0.22 10*3/uL — ABNORMAL HIGH (ref 0.00–0.07)
Basophils Absolute: 0 10*3/uL (ref 0.0–0.1)
Basophils Relative: 0 %
Eosinophils Absolute: 0.1 10*3/uL (ref 0.0–0.5)
Eosinophils Relative: 0 %
HCT: 32.5 % — ABNORMAL LOW (ref 39.0–52.0)
Hemoglobin: 10.6 g/dL — ABNORMAL LOW (ref 13.0–17.0)
Immature Granulocytes: 2 %
Lymphocytes Relative: 16 %
Lymphs Abs: 1.9 10*3/uL (ref 0.7–4.0)
MCH: 30.5 pg (ref 26.0–34.0)
MCHC: 32.6 g/dL (ref 30.0–36.0)
MCV: 93.4 fL (ref 80.0–100.0)
Monocytes Absolute: 1 10*3/uL (ref 0.1–1.0)
Monocytes Relative: 8 %
Neutro Abs: 8.6 10*3/uL — ABNORMAL HIGH (ref 1.7–7.7)
Neutrophils Relative %: 74 %
Platelet Count: 264 10*3/uL (ref 150–400)
RBC: 3.48 MIL/uL — ABNORMAL LOW (ref 4.22–5.81)
RDW: 16.9 % — ABNORMAL HIGH (ref 11.5–15.5)
WBC Count: 11.8 10*3/uL — ABNORMAL HIGH (ref 4.0–10.5)
nRBC: 0 % (ref 0.0–0.2)

## 2020-12-03 LAB — CMP (CANCER CENTER ONLY)
ALT: 18 U/L (ref 0–44)
AST: 15 U/L (ref 15–41)
Albumin: 2.6 g/dL — ABNORMAL LOW (ref 3.5–5.0)
Alkaline Phosphatase: 96 U/L (ref 38–126)
Anion gap: 8 (ref 5–15)
BUN: 38 mg/dL — ABNORMAL HIGH (ref 8–23)
CO2: 22 mmol/L (ref 22–32)
Calcium: 8.9 mg/dL (ref 8.9–10.3)
Chloride: 107 mmol/L (ref 98–111)
Creatinine: 1.89 mg/dL — ABNORMAL HIGH (ref 0.61–1.24)
GFR, Estimated: 37 mL/min — ABNORMAL LOW (ref >60)
Glucose, Bld: 102 mg/dL — ABNORMAL HIGH (ref 70–99)
Potassium: 3.6 mmol/L (ref 3.5–5.1)
Sodium: 137 mmol/L (ref 135–145)
Total Bilirubin: 0.5 mg/dL (ref 0.3–1.2)
Total Protein: 6.3 g/dL — ABNORMAL LOW (ref 6.5–8.1)

## 2020-12-03 LAB — CK: Total CK: 18 U/L — ABNORMAL LOW (ref 49–397)

## 2020-12-03 LAB — SAMPLE TO BLOOD BANK

## 2020-12-03 MED ORDER — HEPARIN SOD (PORK) LOCK FLUSH 100 UNIT/ML IV SOLN
INTRAVENOUS | Status: AC
  Filled 2020-12-03: qty 5

## 2020-12-03 MED ORDER — HEPARIN SOD (PORK) LOCK FLUSH 100 UNIT/ML IV SOLN
500.0000 [IU] | Freq: Once | INTRAVENOUS | Status: AC
  Administered 2020-12-03: 500 [IU] via INTRAVENOUS

## 2020-12-03 MED ORDER — SODIUM CHLORIDE 0.9% FLUSH
10.0000 mL | Freq: Once | INTRAVENOUS | Status: AC
  Administered 2020-12-03: 10 mL
  Filled 2020-12-03: qty 10

## 2020-12-04 LAB — PTH, INTACT AND CALCIUM
Calcium, Total (PTH): 8.6 mg/dL (ref 8.6–10.2)
PTH: 51 pg/mL (ref 15–65)

## 2020-12-05 ENCOUNTER — Other Ambulatory Visit: Payer: Self-pay | Admitting: Medical

## 2020-12-05 DIAGNOSIS — R630 Anorexia: Secondary | ICD-10-CM

## 2020-12-08 ENCOUNTER — Telehealth: Payer: Self-pay | Admitting: Cardiovascular Disease

## 2020-12-08 MED ORDER — METOPROLOL TARTRATE 50 MG PO TABS: ORAL_TABLET | ORAL | 1 refills | Status: DC

## 2020-12-08 NOTE — Telephone Encounter (Signed)
Patient has been made aware. He will take half a tablet in the morning and 50 mg in the evening of the Metoprolol Tartrate.

## 2020-12-08 NOTE — Telephone Encounter (Signed)
Let's try reducing morning dose to 25 mg, stay on evening dose 50 mg. Please try to stick with that until the f/u visit next week.

## 2020-12-08 NOTE — Telephone Encounter (Signed)
Returned the call to the patient. He stated that since he was started on the Metoprolol Tartrate 50 mg twice daily in the hospital that his energy has been "zapped". He checks his blood pressure daily and stated that it is normally in the 160/10 systolic. Yesterday it was 116/75 and heart rate was 86. He wants to know if the dose needs to be decreased or if he needs a new medication. He stated that he feels like he has not had any a fib and has remained in normal rhythm.   He has an appointment on 12/22 but does not want to wait that long for an answer. He has been made aware that Dr. Sallyanne Kuster is out of the office for two weeks.   Per hospital discharge:  Currently in sinus rhythm.  Previous intolerance of amiodarone.  Continue metoprolol 50 mg twice daily.  BP soft, would recommend stopping diltiazem, can titrate up metoprolol if needed for rate control

## 2020-12-08 NOTE — Telephone Encounter (Signed)
Wants Dr. Sallyanne Kuster or nurse to call him about reducing some of his medication. Please call to discuss

## 2020-12-09 ENCOUNTER — Encounter: Payer: Self-pay | Admitting: Medical

## 2020-12-09 ENCOUNTER — Ambulatory Visit: Payer: Medicare Other | Admitting: Oncology

## 2020-12-09 NOTE — Telephone Encounter (Signed)
Hunter [name], For approval or refusal. [name], RN

## 2020-12-12 ENCOUNTER — Ambulatory Visit: Payer: Medicare Other | Admitting: Oncology

## 2020-12-12 ENCOUNTER — Inpatient Hospital Stay (HOSPITAL_BASED_OUTPATIENT_CLINIC_OR_DEPARTMENT_OTHER): Payer: Medicare Other | Admitting: Oncology

## 2020-12-12 ENCOUNTER — Other Ambulatory Visit: Payer: Self-pay

## 2020-12-12 VITALS — BP 114/88 | HR 84 | Temp 98.4°F | Resp 18 | Ht 69.0 in | Wt 176.6 lb

## 2020-12-12 DIAGNOSIS — I2510 Atherosclerotic heart disease of native coronary artery without angina pectoris: Secondary | ICD-10-CM

## 2020-12-12 DIAGNOSIS — C679 Malignant neoplasm of bladder, unspecified: Secondary | ICD-10-CM

## 2020-12-12 NOTE — Progress Notes (Signed)
Hematology and Oncology Follow Up Visit  Hunter [name] [id number] Jan 08, 1946 74 y.o. 12/12/2020 11:56 AM [name] [name], [name], FNP   Principle Diagnosis: 74 year old man with bladder cancer diagnosed in August 2021.  He was found to have T2N0 high-grade urothelial carcinoma.    Prior Therapy:  He is status post TURBT completed in August 2021 which confirmed the presence of muscle invasion.  Neoadjuvant chemotherapy utilizing cisplatin and gemcitabine started on September 17, 2020.  He is status post 3 cycles of therapy completed in November 2021.  Current therapy: Active surveillance and under evaluation for definitive treatment.    Interim History: [name] presents today for return evaluation.  Since the last visit, he reports slight improvement in his overall health.  He is ambulating better at this time with improvement in his exercise tolerance.  He is no longer requiring a wheelchair at this time.  He denies any nausea, vomiting or abdominal pain.  He continues to have occasional dyspnea on exertion that has not worsened at this time.  He denies any hematuria or dysuria.  He does report nocturia which improved slightly.      Medications: Unchanged on review. Current Outpatient Medications  Medication Sig Dispense Refill  . acetaminophen (TYLENOL) 325 MG tablet Take 650 mg by mouth every 6 (six) hours as needed for mild pain, fever or headache.    . allopurinol (ZYLOPRIM) 300 MG tablet Take 300 mg by mouth daily.    Marland Kitchen atorvastatin (LIPITOR) 20 MG tablet Take 1 tablet (20 mg total) by mouth daily at 6 PM. 30 tablet 1  . megestrol (MEGACE) 400 MG/10ML suspension Take 10 mLs (400 mg total) by mouth daily. 240 mL 1  . metoprolol tartrate (LOPRESSOR) 50 MG tablet Take half a tablet in the morning, 25 mg, and then a whole tablet in the evening, 50 mg. 60 tablet 1  . omeprazole (PRILOSEC) 20 MG capsule Take 20 mg by mouth every Monday, Wednesday, and Friday.      . polyethylene glycol (MIRALAX / GLYCOLAX) 17 g packet Take 17 g by mouth daily as needed for mild constipation. 14 each 1  . predniSONE (DELTASONE) 5 MG tablet 6 tab x 3 days, 5 tab x 3 days, 4 tab x 3 days, 3 tab x 3 days, 2 tab x 3 days, 1 tab x 3 day3, stop 63 tablet 0  . prochlorperazine (COMPAZINE) 10 MG tablet Take 1 tablet (10 mg total) by mouth every 6 (six) hours as needed for nausea or vomiting. 30 tablet 0  . tamsulosin (FLOMAX) 0.4 MG CAPS capsule Take 1 capsule (0.4 mg total) by mouth daily. 30 capsule 1   No current facility-administered medications for this visit.   Facility-Administered Medications Ordered in Other Visits  Medication Dose Route Frequency Provider Last Rate Last Admin  . gemcitabine (GEMZAR) chemo syringe for bladder instillation 2,000 mg  2,000 mg Bladder Instillation Once Robley Fries, MD         Allergies:  Allergies  Allergen Reactions  . Amiodarone     fasciculations  . Oxycodone Other (See Comments)    Bad dreams      Physical Exam:   Blood pressure 114/88, pulse 84, temperature 98.4 F (36.9 C), temperature source Tympanic, resp. rate 18, height 5\' 9"  (1.753 m), weight 176 lb 9.6 oz (80.1 kg), SpO2 100 %.     ECOG: 1   General appearance: Comfortable appearing without any discomfort Head: Normocephalic without any trauma Oropharynx: Mucous membranes  are moist and pink without any thrush or ulcers. Eyes: Pupils are equal and round reactive to light. Lymph nodes: No cervical, supraclavicular, inguinal or axillary lymphadenopathy.   Heart:regular rate and rhythm.  S1 and S2 without leg edema. Lung: Clear without any rhonchi or wheezes.  No dullness to percussion. Abdomin: Soft, nontender, nondistended with good bowel sounds.  No hepatosplenomegaly. Musculoskeletal: No joint deformity or effusion.  Full range of motion noted. Neurological: No deficits noted on motor, sensory and deep tendon reflex exam. Skin: No petechial rash or  dryness.  Appeared moist.         Lab Results: Lab Results  Component Value Date   WBC 11.8 (H) 12/03/2020   HGB 10.6 (L) 12/03/2020   HCT 32.5 (L) 12/03/2020   MCV 93.4 12/03/2020   PLT 264 12/03/2020     Chemistry      Component Value Date/Time   NA 137 12/03/2020 0935   NA 142 08/24/2019 1415   K 3.6 12/03/2020 0935   CL 107 12/03/2020 0935   CO2 22 12/03/2020 0935   BUN 38 (H) 12/03/2020 0935   BUN 36 (H) 08/24/2019 1415   CREATININE 1.89 (H) 12/03/2020 0935   CREATININE 1.25 09/26/2014 0921      Component Value Date/Time   CALCIUM 8.6 12/03/2020 0935   CALCIUM 8.9 12/03/2020 0935   ALKPHOS 96 12/03/2020 0935   AST 15 12/03/2020 0935   ALT 18 12/03/2020 0935   BILITOT 0.5 12/03/2020 0935     IMPRESSION: 1. Redemonstrated thickened, diverticular appearance of the superior bladder with adjacent fat stranding, not significantly changed compared to prior examination. 2. There is new, moderate left hydronephrosis and hydroureter to the ureterovesicular junction without obvious obstructing calculus or other lesion. 3. No change in a prominent left iliac lymph node measuring 5 mm. No other enlarged abdominal or pelvic lymph nodes. 4. No evidence of distant metastatic disease in the chest, abdomen, or pelvis. 5. Redemonstrated fine centrilobular nodularity throughout the lungs, most numerous in the apices, in keeping with smoking-related respiratory bronchiolitis. 6. Nonobstructive left nephrolithiasis. 7. Coronary artery disease.   Impression and Plan:  74 year old man with:  1.    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  He status post neoadjuvant chemotherapy completed in November 2021.   The natural course of his disease was reviewed at this time.  CT scan obtained on 12/03/2020 was personally reviewed and showed no clear-cut evidence of metastatic disease.  Treatment options moving forward were discussed.  He  does not require any additional systemic chemotherapy at this time.  Pending his recovery and performance status improvement, radical cystectomy versus radiation therapy to the bladder could be considered.  He will be evaluated by Dr. Tresa Moore next week and I recommended proceeding with radical cystectomy if possible.  2. IV access: Port-A-Cath will continue to be flushed periodically.  3. Chronic renal insufficiency: His creatinine remains elevated at this time with evidence of follow-up left hydronephrosis.  4. Goals of care:His disease remains potentially curable unless he develops locally advanced disease then palliative approach would be implemented.  5.  Anemia: Multifactorial related to renal insufficiency, malignancy and chemotherapy.  His hemoglobin improved after packed red cell transfusion.   6. Follow-up: In the next 2 to 3 months for repeat evaluation.  30  minutes were dedicated to this encounter.  Time was spent on reviewing his disease status, discussing treatment options and reviewing imaging studies.   Zola Button, MD 12/17/202111:56  AM

## 2020-12-17 ENCOUNTER — Other Ambulatory Visit: Payer: Self-pay | Admitting: Urology

## 2020-12-17 ENCOUNTER — Ambulatory Visit (INDEPENDENT_AMBULATORY_CARE_PROVIDER_SITE_OTHER): Payer: Medicare Other | Admitting: Physician Assistant

## 2020-12-17 ENCOUNTER — Other Ambulatory Visit: Payer: Self-pay

## 2020-12-17 ENCOUNTER — Encounter: Payer: Self-pay | Admitting: Physician Assistant

## 2020-12-17 VITALS — BP 128/72 | HR 94 | Ht 69.0 in | Wt 179.0 lb

## 2020-12-17 DIAGNOSIS — I480 Paroxysmal atrial fibrillation: Secondary | ICD-10-CM

## 2020-12-17 DIAGNOSIS — I25810 Atherosclerosis of coronary artery bypass graft(s) without angina pectoris: Secondary | ICD-10-CM

## 2020-12-17 DIAGNOSIS — N183 Chronic kidney disease, stage 3 unspecified: Secondary | ICD-10-CM

## 2020-12-17 DIAGNOSIS — I10 Essential (primary) hypertension: Secondary | ICD-10-CM

## 2020-12-17 DIAGNOSIS — E785 Hyperlipidemia, unspecified: Secondary | ICD-10-CM

## 2020-12-17 DIAGNOSIS — E871 Hypo-osmolality and hyponatremia: Secondary | ICD-10-CM

## 2020-12-17 MED ORDER — METOPROLOL TARTRATE 50 MG PO TABS: 50.0000 mg | ORAL_TABLET | Freq: Two times a day (BID) | ORAL | 9 refills | Status: DC

## 2020-12-17 NOTE — Progress Notes (Signed)
Cardiology Office Note:    Date:  12/19/2020   ID:  Hunter [name], DOB [birth year], MRN [id number]  PCP:  Hunter [name]  [facility] Cardiologist:  Hunter Klein, MD  [facility] Electrophysiologist:  None   Referring MD: Hunter Alken, FNP   Chief Complaint  Patient presents with  . Follow-up    Seen for Dr. Sallyanne [name]    History of Present Illness:    Hunter [name] is a 74 y.o. male with a hx of CAD s/p CABG x 4 (LIMA-LAD, SVG-OM1, OM2, SVG-PD 2013), atrial fibrillation not on anticoagulation, bladder cancer followed by Dr. Alen [name], chronic hyponatremia, hypertension, hyperlipidemia, CKD stage III and GERD. He had postop atrial fibrillation in 2013. Treadmill test in 2016 was low risk. Patient was recently seen in August 2021 for preop clearance at which time he was doing well. He was cleared for surgery at the time. 93-month follow-up was recommended. More recently, patient was admitted with profound hyponatremia, hypotension, dehydration and atrial fibrillation with RVR in November 2021.  On arrival, his creatinine was 2.8 at the cancer center, sodium 118.  EKG showed atrial fibrillation with RVR.  Echocardiogram performed on 11/11/2020 showed EF 50%, mild LVH, normal LA size, no significant valve issue. He was placed on antibiotic for possible UTI. He was also hydrated for AKI as well. During the hospitalization, he converted back to sinus rhythm however quickly went back into atrial fibrillation. He was not placed on anticoagulation therapy due to high risk of bleeding as he had hematuria on heparin. He was also previously intolerant of amiodarone as well. Patient was eventually discharged on metoprolol 50 mg twice daily. Prior to discharge, diltiazem was stopped and it was recommended to uptitrate metoprolol as needed for rate control. Renal function also improved with IV fluid hydration with discharge creatinine 1.7, down from 2.5 on admission. After discharge, patient was  placed on 1 week Zio patch monitor to check for A. fib burden since he is asymptomatic while in A. Fib. Heart monitor does not show any recurrent atrial fibrillation.  Patient presents today for follow-up.  His blood pressure is well controlled at 128/72.  He is no longer hypotensive.  He denies any chest pain however does have shortness of breath and fatigue with minimal activity.  He is recovering from the recent hospitalization and the plan for bladder removal surgery near the end of January or early February.  Heart rate is still borderline high in the 90s, I recommended increase metoprolol tartrate to 50 mg twice daily for better rate control.  EKG today shows he is maintaining sinus rhythm.  I recommended 6 to 8 weeks follow-up however I would prefer him to be seen at least 3 weeks out after his bladder surgery to give him enough time to recover.  Otherwise there is no obvious contraindication from the cardiac perspective to pursue bladder surgery.   Past Medical History:  Diagnosis Date  . Atrial fibrillation, rapid ([place])    With high ventricular rates. Intol amio, was on Sotalol, now on BB  . Bladder cancer ([place]) dx'd 10/2019  . Bursitis of right shoulder   . CAD (coronary artery disease) 11/2012   CABG x 4 using left internal mammary artery and right endovein harvest. (LIMA-LAD; VG-OM1, OM2, VG-PD), EF 55-60% at cath  . Degenerative disc disease, lumbar   . Epigastric pain    Myoview 11/09/12 Showed evidence of reversible ischemia in both the inferior wall & the anteroapical distribution.   Marland Kitchen  Erectile dysfunction    H/O ED while taking a beta-blocker  . GERD (gastroesophageal reflux disease)   . Gout   . Hiatal hernia   . Hyperlipidemia   . Hypertension    Systemic HTN. Pt has H/O of ED when taking a beta-blocker. ECHO 11/26/08 Showed no evidence of any significant valvular disease, Estimated EF = 50-60%.  . Kidney stone    " i have  6-7 stones imbedded in my left kidney "   .  Mononeuritis of unspecified site   . Nocturia   . NSVT (nonsustained ventricular tachycardia) ([place])   . Obesity   . 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 PAF (paroxysmal atrial fibrillation) ([facility])    Post CABG. Amia intol. Was on Sotalol, now on Lopressor  . 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 Seasonal allergies   . Skin cancer (melanoma) ([place]) 08/04/2015   left side of face   . Skin lesion of face    hx of    Past Surgical History:  Procedure Laterality Date  . CARDIAC CATHETERIZATION     12/08/2012  . COLONOSCOPY    . CORONARY ARTERY BYPASS GRAFT  12/12/2012   CABG x 4 using left internal mammary artery and right endovein harvest. (LIMA-LAD; VG-OM1, OM2, VG-PD)  . EYE SURGERY     had lasik surgery bilaterally 1996  . IR IMAGING GUIDED PORT INSERTION  09/10/2020  . LEFT HEART CATHETERIZATION WITH CORONARY ANGIOGRAM N/A 12/08/2012   Procedure: LEFT HEART CATHETERIZATION WITH CORONARY ANGIOGRAM;  Surgeon: Hunter Klein, MD;  Location: [facility];  Service: Cardiovascular;  Laterality: N/A;  . SHOULDER SURGERY     for effects of polio  . SKIN CANCER EXCISION  2016   left side of face   . TRANSURETHRAL RESECTION OF BLADDER TUMOR N/A 11/16/2019   Procedure: TRANSURETHRAL RESECTION OF BLADDER TUMOR (TURBT) WITH INSTILLATION OF POST OPERATIVE CHEMOTHERAPY;  Surgeon: Kathie Rhodes, MD;  Location: [facility];  Service: Urology;  Laterality: N/A;  . TRANSURETHRAL RESECTION OF BLADDER TUMOR WITH MITOMYCIN-C N/A 08/12/2020   Procedure: TRANSURETHRAL RESECTION OF BLADDER TUMOR WITH GEMCITABINE;  Surgeon: Robley Fries, MD;  Location: [facility];  Service: Urology;  Laterality: N/A;  1 HR  . UPPER GI ENDOSCOPY      Current Medications: Current Meds  Medication Sig  . acetaminophen (TYLENOL) 325 MG tablet Take 650 mg by mouth every 6 (six) hours as needed for  mild pain, fever or headache.  . allopurinol (ZYLOPRIM) 300 MG tablet Take 300 mg by mouth daily.  Marland Kitchen atorvastatin (LIPITOR) 20 MG tablet Take 1 tablet (20 mg total) by mouth daily at 6 PM.  . omeprazole (PRILOSEC) 20 MG capsule Take 20 mg by mouth every Monday, Wednesday, and Friday.   . polyethylene glycol (MIRALAX / GLYCOLAX) 17 g packet Take 17 g by mouth daily as needed for mild constipation.  . prochlorperazine (COMPAZINE) 10 MG tablet Take 1 tablet (10 mg total) by mouth every 6 (six) hours as needed for nausea or vomiting.  . tamsulosin (FLOMAX) 0.4 MG CAPS capsule Take 1 capsule (0.4 mg total) by mouth daily.  . [DISCONTINUED] metoprolol tartrate (LOPRESSOR) 50 MG tablet Take half a tablet in the morning, 25 mg, and then a whole tablet in the evening, 50 mg.     Allergies:   Amiodarone  and Oxycodone   Social History   Socioeconomic History  . Marital status: Married    Spouse name: Not on file  . Number of children: 3  . Years of education: Not on file  . Highest education level: Not on file  Occupational History  . Occupation: Higher education careers adviser (RETIRED)    Employer: [employer]  Tobacco Use  . Smoking status: Never Smoker  . Smokeless tobacco: Never Used  Substance and Sexual Activity  . Alcohol use: Yes    Comment: social, very little amount, tequila  . Drug use: No  . Sexual activity: Not on file  Other Topics Concern  . Not on file  Social History Narrative  . Not on file   Social Determinants of Health   Financial Resource Strain: Not on file  Food Insecurity: Not on file  Transportation Needs: Not on file  Physical Activity: Not on file  Stress: Not on file  Social Connections: Not on file     Family History: The patient's family history includes AAA (abdominal aortic aneurysm) in his father; Aneurysm in his father; CAD in his father; Cancer in his mother; Heart attack in his paternal grandfather; Hypertension in his father.  ROS:   Please see the  history of present illness.     All other systems reviewed and are negative.  EKGs/Labs/Other Studies Reviewed:    The following studies were reviewed today:  Echo 11/11/2020 1. Left ventricular ejection fraction, by estimation, is 50%. The left  ventricle has low normal function with beat to beat variability. The left  ventricle has no regional wall motion abnormalities. There is mild left  ventricular hypertrophy. Left  ventricular diastolic parameters are indeterminate.  2. Right ventricular systolic function is normal. The right ventricular  size is normal. Tricuspid regurgitation signal is inadequate for assessing  PA pressure.  3. The mitral valve is normal in structure. Trivial mitral valve  regurgitation. No evidence of mitral stenosis.  4. The aortic valve is normal in structure. Aortic valve regurgitation is  not visualized. No aortic stenosis is present.   EKG:  EKG is not ordered today.  The ekg ordered today demonstrates normal sinus rhythm, no significant ST changes, possible Q waves in the inferior lead.  Recent Labs: 11/10/2020: Magnesium 2.2 11/11/2020: TSH 0.466 12/03/2020: ALT 18; BUN 38; Creatinine 1.89; Hemoglobin 10.6; Platelet Count 264; Potassium 3.6; Sodium 137  Recent Lipid Panel    Component Value Date/Time   CHOL 128 09/26/2014 0921   TRIG 146 09/26/2014 0921   HDL 46 09/26/2014 0921   CHOLHDL 2.8 09/26/2014 0921   VLDL 29 09/26/2014 0921   LDLCALC 53 09/26/2014 0921   LDLDIRECT 83.9 10/26/2010 1056     Risk Assessment/Calculations:     CHA2DS2-VASc Score = 3  This indicates a 3.2% annual risk of stroke. The patient's score is based upon: CHF History: No HTN History: Yes Diabetes History: No Stroke History: No Vascular Disease History: Yes Age Score: 1 Gender Score: 0      Physical Exam:    VS:  BP 128/72   Pulse 94   Ht 5\' 9"  (1.753 m)   Wt 179 lb (81.2 kg)   SpO2 98%   BMI 26.43 kg/m     Wt Readings from Last 3  Encounters:  12/17/20 179 lb (81.2 kg)  12/12/20 176 lb 9.6 oz (80.1 kg)  11/24/20 175 lb 8 oz (79.6 kg)     GEN: Well nourished, well developed in no acute distress  HEENT: Normal NECK: No JVD; No carotid bruits LYMPHATICS: No lymphadenopathy CARDIAC: RRR, no murmurs, rubs, gallops RESPIRATORY:  Clear to auscultation without rales, wheezing or rhonchi  ABDOMEN: Soft, non-tender, non-distended MUSCULOSKELETAL:  No edema; No deformity  SKIN: Warm and dry NEUROLOGIC:  Alert and oriented x 3 PSYCHIATRIC:  Normal affect   ASSESSMENT:    1. Paroxysmal atrial fibrillation (H[facility])   2. Coronary artery disease involving coronary bypass graft of native heart without angina pectoris   3. Essential hypertension   4. Hyperlipidemia LDL goal <70   5. Stage 3 chronic kidney disease, unspecified whether stage 3a or 3b CKD ([place])   6. Hyponatremia    PLAN:    In order of problems listed above:  1. Paroxysmal atrial fibrillation: Recent Zio monitor does not show any recurrence of atrial fibrillation.  He is not a candidate for anticoagulation therapy due to bleeding risk.  2. CAD: Denies any recent chest discomfort.  3. Hypertension: Patient was hypotensive during the recent hospitalization, this has resolved.  Blood pressure normalized  4. Hyperlipidemia: On Lipitor  5. CKD stage III: Renal function stable after discharge  6. Hyponatremia: Most recent lab work showed sodium 137.        Medication Adjustments/Labs and Tests Ordered: Current medicines are reviewed at length with the patient today.  Concerns regarding medicines are outlined above.  Orders Placed This Encounter  Procedures  . EKG 12-Lead   Meds ordered this encounter  Medications  . metoprolol tartrate (LOPRESSOR) 50 MG tablet    Sig: Take 1 tablet (50 mg total) by mouth 2 (two) times daily. Monitor BP before taking 2nd dose SBP>100    Dispense:  60 tablet    Refill:  9    Patient Instructions  Medication  Instructions:  INCREASE METOPROLOL 50MG  TWICE DAILY-TAKE BLOOD PRESSURE BEFORE 2nd DOSE >100  *If you need a refill on your cardiac medications before your next appointment, please call your pharmacy*  Lab Work:   Testing/Procedures:  ONCOLOGY    NONE  Follow-Up: Your next appointment:  6-8 month(s) In Person with You may see Hunter Klein, MD or one of the following Advanced Practice Providers on your designated Care Team:  IF UNAVAILABLE Almyra Deforest, PA-C OR Fabian Sharp, PA-C-FIRST or [employer], PA-C  MUST BE A DAY WHEN DR CROITORU IS HERE TO DISCUSS. ALSO, AT LEAST 3 WEEKS AFTER YOU GET HOME FROM YOUR BLADDER SURGERY. Please call our office 2 months in advance to schedule this appointment   At [facility], you and your health needs are our priority.  As part of our continuing mission to provide you with exceptional heart care, we have created designated Provider Care Teams.  These Care Teams include your primary Cardiologist (physician) and Advanced Practice Providers (APPs -  Physician Assistants and Nurse Practitioners) who all work together to provide you with the care you need, when you need it.      Hilbert Corrigan, [place]  12/19/2020 8:48 PM    [facility]

## 2020-12-17 NOTE — Patient Instructions (Addendum)
Medication Instructions:  INCREASE METOPROLOL 50MG  TWICE DAILY-TAKE BLOOD PRESSURE BEFORE 2nd DOSE >100  *If you need a refill on your cardiac medications before your next appointment, please call your pharmacy*  Lab Work:   Testing/Procedures:  ONCOLOGY    NONE  Follow-Up: Your next appointment:  6-8 month(s) In Person with You may see Sanda Klein, MD or one of the following Advanced Practice Providers on your designated Care Team:  IF UNAVAILABLE Almyra Deforest, PA-C OR Fabian Sharp, PA-C-FIRST or [employer], PA-C  MUST BE A DAY WHEN DR [place]. ALSO, AT LEAST 3 WEEKS AFTER YOU GET HOME FROM YOUR BLADDER SURGERY. Please call our office 2 months in advance to schedule this appointment   At [facility], you and your health needs are our priority.  As part of our continuing mission to provide you with exceptional heart care, we have created designated Provider Care Teams.  These Care Teams include your primary Cardiologist (physician) and Advanced Practice Providers (APPs -  Physician Assistants and Nurse Practitioners) who all work together to provide you with the care you need, when you need it.

## 2020-12-19 ENCOUNTER — Encounter: Payer: Self-pay | Admitting: Physician Assistant

## 2020-12-31 ENCOUNTER — Other Ambulatory Visit: Payer: Self-pay | Admitting: Cardiovascular Disease

## 2021-01-27 ENCOUNTER — Telehealth: Payer: Self-pay | Admitting: *Deleted

## 2021-01-27 MED ORDER — SIMVASTATIN 40 MG PO TABS: 40.0000 mg | ORAL_TABLET | Freq: Every day | ORAL | 3 refills | Status: DC

## 2021-01-27 NOTE — Telephone Encounter (Signed)
The patient came into the office to request that he be placed back on Simvastatin. He was switched to Atorvastatin at his hospital visit in November. He stated that it keeps him up at night and he does not want to be on it anymore. He was unclear as to why it was switched.

## 2021-01-27 NOTE — Telephone Encounter (Signed)
Patient has been made aware. New prescription has been sent in.

## 2021-01-27 NOTE — Telephone Encounter (Signed)
I think he was switched due to simvastatin interactions with meds that were necessary when he was hospitalized. No longer an issue. OK to switch back to simvastatin 40 mg in the evening.

## 2021-02-03 ENCOUNTER — Other Ambulatory Visit: Payer: Self-pay | Admitting: Urology

## 2021-02-03 ENCOUNTER — Encounter (HOSPITAL_COMMUNITY): Payer: Self-pay | Admitting: Urology

## 2021-02-03 ENCOUNTER — Inpatient Hospital Stay (HOSPITAL_COMMUNITY)
Admission: RE | Admit: 2021-02-03 | Discharge: 2021-02-10 | DRG: 654 | Disposition: A | Discharge status: Home Health | Payer: Medicare Other | Attending: Urology | Admitting: Urology

## 2021-02-03 ENCOUNTER — Other Ambulatory Visit: Payer: Self-pay

## 2021-02-03 DIAGNOSIS — Z85820 Personal history of malignant melanoma of skin: Secondary | ICD-10-CM

## 2021-02-03 DIAGNOSIS — I10 Essential (primary) hypertension: Secondary | ICD-10-CM | POA: Diagnosis present

## 2021-02-03 DIAGNOSIS — Z79899 Other long term (current) drug therapy: Secondary | ICD-10-CM

## 2021-02-03 DIAGNOSIS — K219 Gastro-esophageal reflux disease without esophagitis: Secondary | ICD-10-CM | POA: Diagnosis present

## 2021-02-03 DIAGNOSIS — I2510 Atherosclerotic heart disease of native coronary artery without angina pectoris: Secondary | ICD-10-CM | POA: Diagnosis present

## 2021-02-03 DIAGNOSIS — Z951 Presence of aortocoronary bypass graft: Secondary | ICD-10-CM | POA: Diagnosis not present

## 2021-02-03 DIAGNOSIS — N281 Cyst of kidney, acquired: Secondary | ICD-10-CM | POA: Diagnosis present

## 2021-02-03 DIAGNOSIS — Z20822 Contact with and (suspected) exposure to covid-19: Secondary | ICD-10-CM | POA: Diagnosis present

## 2021-02-03 DIAGNOSIS — E785 Hyperlipidemia, unspecified: Secondary | ICD-10-CM | POA: Diagnosis present

## 2021-02-03 DIAGNOSIS — C679 Malignant neoplasm of bladder, unspecified: Principal | ICD-10-CM | POA: Diagnosis present

## 2021-02-03 DIAGNOSIS — C61 Malignant neoplasm of prostate: Secondary | ICD-10-CM | POA: Diagnosis present

## 2021-02-03 DIAGNOSIS — Z800 Family history of malignant neoplasm of digestive organs: Secondary | ICD-10-CM

## 2021-02-03 DIAGNOSIS — N1339 Other hydronephrosis: Secondary | ICD-10-CM | POA: Diagnosis present

## 2021-02-03 DIAGNOSIS — K567 Ileus, unspecified: Secondary | ICD-10-CM | POA: Diagnosis not present

## 2021-02-03 DIAGNOSIS — I480 Paroxysmal atrial fibrillation: Secondary | ICD-10-CM | POA: Diagnosis present

## 2021-02-03 LAB — CBC
HCT: 34.1 % — ABNORMAL LOW (ref 39.0–52.0)
Hemoglobin: 11 g/dL — ABNORMAL LOW (ref 13.0–17.0)
MCH: 31.6 pg (ref 26.0–34.0)
MCHC: 32.3 g/dL (ref 30.0–36.0)
MCV: 98 fL (ref 80.0–100.0)
Platelets: 181 10*3/uL (ref 150–400)
RBC: 3.48 MIL/uL — ABNORMAL LOW (ref 4.22–5.81)
RDW: 14.3 % (ref 11.5–15.5)
WBC: 8.2 10*3/uL (ref 4.0–10.5)
nRBC: 0 % (ref 0.0–0.2)

## 2021-02-03 LAB — COMPREHENSIVE METABOLIC PANEL
ALT: 12 U/L (ref 0–44)
AST: 15 U/L (ref 15–41)
Albumin: 3.6 g/dL (ref 3.5–5.0)
Alkaline Phosphatase: 103 U/L (ref 38–126)
Anion gap: 9 (ref 5–15)
BUN: 22 mg/dL (ref 8–23)
CO2: 22 mmol/L (ref 22–32)
Calcium: 9 mg/dL (ref 8.9–10.3)
Chloride: 107 mmol/L (ref 98–111)
Creatinine, Ser: 2.03 mg/dL — ABNORMAL HIGH (ref 0.61–1.24)
GFR, Estimated: 34 mL/min — ABNORMAL LOW (ref >60)
Glucose, Bld: 93 mg/dL (ref 70–99)
Potassium: 3.5 mmol/L (ref 3.5–5.1)
Sodium: 138 mmol/L (ref 135–145)
Total Bilirubin: 0.6 mg/dL (ref 0.3–1.2)
Total Protein: 6.9 g/dL (ref 6.5–8.1)

## 2021-02-03 LAB — SURGICAL PCR SCREEN
MRSA, PCR: NEGATIVE
Staphylococcus aureus: POSITIVE — AB

## 2021-02-03 LAB — SARS CORONAVIRUS 2 BY RT PCR (HOSPITAL ORDER, PERFORMED IN ~~LOC~~ HOSPITAL LAB): SARS Coronavirus 2: NEGATIVE

## 2021-02-03 LAB — PREPARE RBC (CROSSMATCH)

## 2021-02-03 MED ORDER — PEG 3350-KCL-NA BICARB-NACL 420 G PO SOLR
4000.0000 mL | Freq: Once | ORAL | Status: AC
  Administered 2021-02-03: 4000 mL via ORAL

## 2021-02-03 MED ORDER — ALLOPURINOL 300 MG PO TABS
300.0000 mg | ORAL_TABLET | Freq: Every day | ORAL | Status: DC
  Administered 2021-02-05 – 2021-02-10 (×6): 300 mg via ORAL
  Filled 2021-02-03 (×6): qty 1

## 2021-02-03 MED ORDER — SIMVASTATIN 40 MG PO TABS
40.0000 mg | ORAL_TABLET | Freq: Every day | ORAL | Status: DC
  Administered 2021-02-03 – 2021-02-09 (×6): 40 mg via ORAL
  Filled 2021-02-03 (×6): qty 1

## 2021-02-03 MED ORDER — METOPROLOL TARTRATE 50 MG PO TABS
50.0000 mg | ORAL_TABLET | Freq: Every day | ORAL | Status: DC
  Administered 2021-02-03 – 2021-02-09 (×6): 50 mg via ORAL
  Filled 2021-02-03 (×6): qty 1

## 2021-02-03 MED ORDER — METRONIDAZOLE 500 MG PO TABS
500.0000 mg | ORAL_TABLET | ORAL | Status: AC
  Administered 2021-02-03 (×2): 500 mg via ORAL
  Filled 2021-02-03 (×2): qty 1

## 2021-02-03 MED ORDER — PIPERACILLIN-TAZOBACTAM 3.375 G IVPB 30 MIN: 3.3750 g | INTRAVENOUS | Status: DC

## 2021-02-03 MED ORDER — CHLORHEXIDINE GLUCONATE CLOTH 2 % EX PADS
6.0000 | MEDICATED_PAD | Freq: Every day | CUTANEOUS | Status: DC
  Administered 2021-02-03 – 2021-02-10 (×7): 6 via TOPICAL

## 2021-02-03 MED ORDER — CHLORHEXIDINE GLUCONATE CLOTH 2 % EX PADS: 6.0000 | MEDICATED_PAD | Freq: Every day | CUTANEOUS | Status: DC

## 2021-02-03 MED ORDER — METOPROLOL TARTRATE 50 MG PO TABS: 50.0000 mg | ORAL_TABLET | Freq: Two times a day (BID) | ORAL | Status: DC

## 2021-02-03 MED ORDER — PIPERACILLIN-TAZOBACTAM IN DEX 2-0.25 GM/50ML IV SOLN
2.2500 g | INTRAVENOUS | Status: AC
  Administered 2021-02-04: 2.25 g via INTRAVENOUS
  Filled 2021-02-03 (×2): qty 50

## 2021-02-03 MED ORDER — METOPROLOL TARTRATE 25 MG PO TABS
25.0000 mg | ORAL_TABLET | Freq: Every day | ORAL | Status: DC
  Administered 2021-02-04 – 2021-02-10 (×7): 25 mg via ORAL
  Filled 2021-02-03 (×7): qty 1

## 2021-02-03 MED ORDER — SODIUM CHLORIDE 0.9% FLUSH
10.0000 mL | Freq: Two times a day (BID) | INTRAVENOUS | Status: DC
  Administered 2021-02-03 – 2021-02-09 (×8): 10 mL

## 2021-02-03 MED ORDER — CHLORHEXIDINE GLUCONATE CLOTH 2 % EX PADS
6.0000 | MEDICATED_PAD | Freq: Every day | CUTANEOUS | Status: AC
  Administered 2021-02-04 – 2021-02-08 (×5): 6 via TOPICAL

## 2021-02-03 MED ORDER — MUPIROCIN 2 % EX OINT
1.0000 "application " | TOPICAL_OINTMENT | Freq: Two times a day (BID) | CUTANEOUS | Status: AC
  Administered 2021-02-03 – 2021-02-08 (×9): 1 via NASAL
  Filled 2021-02-03 (×3): qty 22

## 2021-02-03 MED ORDER — NEOMYCIN SULFATE 500 MG PO TABS
500.0000 mg | ORAL_TABLET | ORAL | Status: AC
  Administered 2021-02-03 (×2): 500 mg via ORAL
  Filled 2021-02-03 (×2): qty 1

## 2021-02-03 MED ORDER — PANTOPRAZOLE SODIUM 40 MG PO TBEC
40.0000 mg | DELAYED_RELEASE_TABLET | Freq: Every day | ORAL | Status: DC
  Administered 2021-02-05 – 2021-02-08 (×4): 40 mg via ORAL
  Filled 2021-02-03 (×5): qty 1

## 2021-02-03 MED ORDER — SODIUM CHLORIDE 0.9% FLUSH: 10.0000 mL | INTRAVENOUS | Status: DC | PRN

## 2021-02-03 MED ORDER — ALVIMOPAN 12 MG PO CAPS
12.0000 mg | ORAL_CAPSULE | ORAL | Status: AC
  Administered 2021-02-04: 12 mg via ORAL
  Filled 2021-02-03: qty 1

## 2021-02-03 NOTE — H&P (Signed)
Hunter [name] is an 75 y.o. male.    Chief Complaint: Pre-OP Cystoprostatectomy  HPI:   1 - Muscle Invasive Bladder Cancer - T2G3 bladder cacner with some CIS (negative prostaticurethrao BX) by TURBT 07/2020. Prior high grade T1 disease that progressed. Staging CT 08/2020 clinically localized. Single ureters bilaterally. Completed 3 cycles neoadjuvant chemo by [facility] complicated by some significant anorexia. Restaging CT c/a/p 11/2020 with some new left mild malig hydro to bladder but no overt metastatic disease, stable left 3mm equivocal iliac not and some bladder dome stranding.   2 - Large Rt Renal Cyst - 8cm right renal cyst with some slight mass effect on UPJ (no hydro). No enhancing nodules.   3 - Urolithiasis - incidetnal 57mm LLP stone on CT 11/2020   4 - Left Malignant Hydronephrosis / Renal Insuficiency - Cr 2's 11/2020 with new left hdyro to bladder. Baselien Cr <1.5.   PMH sig for CAD/CABG (follows Dr. [name] at [facility]), polio / L arm atrophy and child surgery, gout. His wife [name] is very involved. His PCP is [name] NP with [facility] in [place].   Today " Hunter [name] " is seen as pre-op admission, labs, bowel prep prior to curative intetn cystoprostatectomy tomorrow. He has been doing well and maintaiing weight.    Past Medical History:  Diagnosis Date  . 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 Intol amio, was on Sotalol, now on BB  . Bladder cancer ([place]) dx'd 10/2019  . Bursitis of right shoulder   . CAD (coronary artery disease) 11/2012   CABG x 4 using left internal mammary artery and right endovein harvest. (LIMA-LAD; VG-OM1, OM2, VG-PD), EF 55-60% at cath  . Degenerative disc disease, lumbar   . Epigastric pain    Myoview 11/09/12 Showed evidence of reversible ischemia in both the inferior wall & the anteroapical distribution.   . Erectile dysfunction    H/O ED while taking a beta-blocker  . GERD (gastroesophageal reflux disease)    . Gout   . Hiatal hernia   . Hyperlipidemia   . Hypertension    Systemic HTN. Pt has H/O of ED when taking a beta-blocker. ECHO 11/26/08 Showed no evidence of any significant valvular disease, Estimated EF = 50-60%.  . Kidney stone    " i have  6-7 stones imbedded in my left kidney "   . Mononeuritis of unspecified site   . Nocturia   . NSVT (nonsustained ventricular tachycardia) ([place])   . Obesity   . 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 PAF (paroxysmal atrial fibrillation) ([facility])    Post CABG. Amia intol. Was on Sotalol, now on Lopressor  . Polio    Age 45. 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 Seasonal allergies   . Skin cancer (melanoma) ([place]) 08/04/2015   left side of face   . Skin lesion of face    hx of    Past Surgical History:  Procedure Laterality Date  . CARDIAC CATHETERIZATION     12/08/2012  . COLONOSCOPY    . CORONARY ARTERY BYPASS GRAFT  12/12/2012   CABG x 4 using left internal mammary artery and right endovein harvest. (LIMA-LAD; VG-OM1, OM2, VG-PD)  . EYE SURGERY     had lasik surgery bilaterally 1996  . IR IMAGING GUIDED PORT INSERTION  09/10/2020  . LEFT HEART  CATHETERIZATION WITH CORONARY ANGIOGRAM N/A 12/08/2012   Procedure: LEFT HEART CATHETERIZATION WITH CORONARY ANGIOGRAM;  Surgeon: Sanda Klein, MD;  Location: [facility];  Service: Cardiovascular;  Laterality: N/A;  . SHOULDER SURGERY     for effects of polio  . SKIN CANCER EXCISION  2016   left side of face   . TRANSURETHRAL RESECTION OF BLADDER TUMOR N/A 11/16/2019   Procedure: TRANSURETHRAL RESECTION OF BLADDER TUMOR (TURBT) WITH INSTILLATION OF POST OPERATIVE CHEMOTHERAPY;  Surgeon: Kathie Rhodes, MD;  Location: [facility];  Service: Urology;  Laterality: N/A;  . TRANSURETHRAL RESECTION OF BLADDER TUMOR WITH MITOMYCIN-C N/A 08/12/2020   Procedure: TRANSURETHRAL RESECTION OF BLADDER  TUMOR WITH GEMCITABINE;  Surgeon: Robley Fries, MD;  Location: [facility];  Service: Urology;  Laterality: N/A;  1 HR  . UPPER GI ENDOSCOPY      Family History  Problem Relation Age of Onset  . CAD Father   . Hypertension Father   . AAA (abdominal aortic aneurysm) Father   . Aneurysm Father        Cause of death  . Heart attack Paternal Grandfather   . Cancer Mother        Colon. Cause of death   Social History:  reports that he has never smoked. He has never used smokeless tobacco. He reports current alcohol use. He reports that he does not use drugs.  Allergies:  Allergies  Allergen Reactions  . Amiodarone     fasciculations  . Oxycodone Other (See Comments)    Bad dreams    Medications Prior to Admission  Medication Sig Dispense Refill  . acetaminophen (TYLENOL) 325 MG tablet Take 650 mg by mouth every 6 (six) hours as needed for mild pain, fever or headache.    . allopurinol (ZYLOPRIM) 300 MG tablet Take 300 mg by mouth daily.    . metoprolol tartrate (LOPRESSOR) 50 MG tablet Take 1 tablet (50 mg total) by mouth 2 (two) times daily. Monitor BP before taking 2nd dose SBP>100 (Patient taking differently: Take 50 mg by mouth 2 (two) times daily. Take 0.5 tablet (25 mg) by mouth in the morning & take 1 tablet (50 mg) by mouth at night. Monitor BP before taking 2nd dose SBP>100) 60 tablet 9  . omeprazole (PRILOSEC) 20 MG capsule Take 20 mg by mouth every Monday, Wednesday, and Friday.     . tamsulosin (FLOMAX) 0.4 MG CAPS capsule Take 1 capsule (0.4 mg total) by mouth daily. (Patient taking differently: Take 0.4 mg by mouth 4 (four) times a week. Saturdays, Sundays, Tuesdays & Thursdays) 30 capsule 1  . megestrol (MEGACE) 400 MG/10ML suspension Take 10 mLs (400 mg total) by mouth daily. (Patient not taking: Reported on 01/21/2021) 240 mL 1  . polyethylene glycol (MIRALAX / GLYCOLAX) 17 g packet Take 17 g by mouth daily as needed for mild constipation. (Patient not taking: Reported  on 01/21/2021) 14 each 1  . prochlorperazine (COMPAZINE) 10 MG tablet Take 1 tablet (10 mg total) by mouth every 6 (six) hours as needed for nausea or vomiting. 30 tablet 0  . simvastatin (ZOCOR) 40 MG tablet Take 1 tablet (40 mg total) by mouth at bedtime. 90 tablet 3    No results found for this or any previous visit (from the past 48 hour(s)). No results found.  Review of Systems  Constitutional: Negative for chills and fever.  All other systems reviewed and are negative.   Blood pressure 128/83, pulse 89, temperature (!) 97.4 F (36.3  C), temperature source Oral, resp. rate 20, SpO2 100 %. Physical Exam Constitutional:      Comments: Wife at bedside. Both very pleasant and spry.   HENT:     Head: Normocephalic.     Nose: Nose normal.  Cardiovascular:     Comments: Well healed sternotimy scar. Rt sided port accessed and w/o site reaction.  Pulmonary:     Effort: Pulmonary effort is normal.  Abdominal:     General: Abdomen is flat.  Musculoskeletal:     Cervical back: Normal range of motion.     Comments: Chronic atrophie / weak LUE c/w remote polio.   Skin:    General: Skin is warm.  Neurological:     Mental Status: He is alert.  Psychiatric:        Mood and Affect: Mood normal.      Assessment/Plan  CMP, CBC, T+C 2 units, Bowel Prep, Stomal Marking, Clears, NPO p MN prior to cystoprostatectomy and conduit diversion tomorrow. Risks (including non-cure and mortality), benefits, alternatives, expected peri-op course (5.5 hour surgery, 7 day hospital stay, home with home health, 65mos to full recovery) discussed previously and reiterated today.   Alexis Frock, MD 02/03/2021, 4:52 PM

## 2021-02-03 NOTE — Anesthesia Preprocedure Evaluation (Addendum)
Anesthesia Evaluation  Patient identified by MRN, date of birth, ID band Patient awake    Reviewed: Allergy & Precautions, NPO status , Patient's Chart, lab work & pertinent test results  Airway Mallampati: II  TM Distance: >3 FB Neck ROM: Full    Dental  (+) Dental Advisory Given, Teeth Intact   Pulmonary neg pulmonary ROS,    Pulmonary exam normal breath sounds clear to auscultation       Cardiovascular hypertension, Pt. on home beta blockers + angina + CAD and + CABG  Normal cardiovascular exam Rhythm:Regular Rate:Normal  Echo 10/2020 1. Left ventricular ejection fraction, by estimation, is 50%. The left ventricle has low normal function with beat to beat variability. The left ventricle has no regional wall motion abnormalities. There is mild left ventricular hypertrophy. Left ventricular diastolic parameters are indeterminate.  2. Right ventricular systolic function is normal. The right ventricular size is normal. Tricuspid regurgitation signal is inadequate for assessing PA pressure.  3. The mitral valve is normal in structure. Trivial mitral valve regurgitation. No evidence of mitral stenosis.  4. The aortic valve is normal in structure. Aortic valve regurgitation is not visualized. No aortic stenosis is present.    Neuro/Psych  Neuromuscular disease    GI/Hepatic Neg liver ROS, hiatal hernia, GERD  ,  Endo/Other  negative endocrine ROS  Renal/GU Renal disease     Musculoskeletal  (+) Arthritis ,   Abdominal   Peds  Hematology negative hematology ROS (+)   Anesthesia Other Findings   Reproductive/Obstetrics                           Anesthesia Physical Anesthesia Plan  ASA: III  Anesthesia Plan: General   Post-op Pain Management:    Induction: Intravenous  PONV Risk Score and Plan: 4 or greater and Ondansetron, Aprepitant, Dexamethasone, Diphenhydramine and Treatment may vary  due to age or medical condition  Airway Management Planned: Oral ETT  Additional Equipment: None  Intra-op Plan:   Post-operative Plan: Extubation in OR  Informed Consent: I have reviewed the patients History and Physical, chart, labs and discussed the procedure including the risks, benefits and alternatives for the proposed anesthesia with the patient or authorized representative who has indicated his/her understanding and acceptance.     Dental advisory given  Plan Discussed with: CRNA  Anesthesia Plan Comments:      Anesthesia Quick Evaluation

## 2021-02-03 NOTE — Consult Note (Signed)
[facility] Nurse requested for preoperative stoma site marking  Discussed surgical procedure and stoma creation with patient and family.  Explained role of the [facility] nurse team.  Provided the patient with educational booklet and provided samples of pouching options.  Answered patient and family questions.   Examined patient lying, sitting, and standing in order to place the marking in the patient's visual field, away from any creases or abdominal contour issues and within the rectus muscle.  Attempted to mark below the patient's belt line. Patient has deep creasing above and below the umbilicus.  He has had a seventy pound weight loss.  I have marked the site and noted the creasing.     Marked for ileal conduit in the RLQ 4 cm to the right of the umbilicus and  2 cm below the umbilicus.   Patient's abdomen cleansed with CHG wipes at site markings, allowed to air dry prior to marking.Covered mark with thin film transparent dressing to preserve mark until date of surgery.   [facility] Nurse team will follow up with patient after surgery for continue ostomy care and teaching.   Domenic Moras MSN, RN, FNP-BC CWON Wound, Ostomy, Continence Nurse Pager [phone]

## 2021-02-04 ENCOUNTER — Encounter (HOSPITAL_COMMUNITY): Payer: Self-pay | Admitting: Urology

## 2021-02-04 ENCOUNTER — Inpatient Hospital Stay (HOSPITAL_COMMUNITY): Payer: Medicare Other | Admitting: Anesthesiology

## 2021-02-04 ENCOUNTER — Encounter (HOSPITAL_COMMUNITY)
Admission: RE | Disposition: A | Discharge status: Home Health | Payer: Self-pay | Source: Home / Self Care | Attending: Urology

## 2021-02-04 DIAGNOSIS — C679 Malignant neoplasm of bladder, unspecified: Secondary | ICD-10-CM | POA: Diagnosis present

## 2021-02-04 HISTORY — PX: LYMPHADENECTOMY: SHX5960

## 2021-02-04 HISTORY — PX: ROBOT ASSISTED LAPAROSCOPIC COMPLETE CYSTECT ILEAL CONDUIT: SHX5139

## 2021-02-04 HISTORY — PX: CYSTOSCOPY WITH INJECTION: SHX1424

## 2021-02-04 LAB — HEMOGLOBIN AND HEMATOCRIT, BLOOD
HCT: 35 % — ABNORMAL LOW (ref 39.0–52.0)
Hemoglobin: 11.4 g/dL — ABNORMAL LOW (ref 13.0–17.0)

## 2021-02-04 SURGERY — CYSTECTOMY, ROBOT-ASSISTED, WITH ILEAL CONDUIT CREATION
Anesthesia: General

## 2021-02-04 MED ORDER — MEPERIDINE HCL 50 MG/ML IJ SOLN: 6.2500 mg | INTRAMUSCULAR | Status: DC | PRN

## 2021-02-04 MED ORDER — PHENYLEPHRINE HCL (PRESSORS) 10 MG/ML IV SOLN
INTRAVENOUS | Status: DC | PRN
  Administered 2021-02-04 (×4): 80 ug via INTRAVENOUS

## 2021-02-04 MED ORDER — LIDOCAINE HCL (PF) 2 % IJ SOLN
INTRAMUSCULAR | Status: AC
  Filled 2021-02-04: qty 5

## 2021-02-04 MED ORDER — STERILE WATER FOR IRRIGATION IR SOLN
Status: DC | PRN
  Administered 2021-02-04: 3000 mL

## 2021-02-04 MED ORDER — DIPHENHYDRAMINE HCL 50 MG/ML IJ SOLN: 12.5000 mg | Freq: Four times a day (QID) | INTRAMUSCULAR | Status: DC | PRN

## 2021-02-04 MED ORDER — ACETAMINOPHEN 500 MG PO TABS
1000.0000 mg | ORAL_TABLET | Freq: Four times a day (QID) | ORAL | Status: AC
  Administered 2021-02-04 – 2021-02-05 (×2): 1000 mg via ORAL
  Filled 2021-02-04 (×2): qty 2

## 2021-02-04 MED ORDER — FENTANYL CITRATE (PF) 250 MCG/5ML IJ SOLN
INTRAMUSCULAR | Status: DC | PRN
  Administered 2021-02-04 (×3): 50 ug via INTRAVENOUS
  Administered 2021-02-04: 100 ug via INTRAVENOUS
  Administered 2021-02-04 (×2): 50 ug via INTRAVENOUS

## 2021-02-04 MED ORDER — OXYCODONE HCL 5 MG PO TABS: 5.0000 mg | ORAL_TABLET | ORAL | Status: DC | PRN

## 2021-02-04 MED ORDER — CHLORHEXIDINE GLUCONATE 0.12 % MT SOLN
15.0000 mL | Freq: Once | OROMUCOSAL | Status: AC
  Administered 2021-02-04: 15 mL via OROMUCOSAL

## 2021-02-04 MED ORDER — ROCURONIUM BROMIDE 10 MG/ML (PF) SYRINGE
PREFILLED_SYRINGE | INTRAVENOUS | Status: DC | PRN
  Administered 2021-02-04: 20 mg via INTRAVENOUS
  Administered 2021-02-04: 60 mg via INTRAVENOUS
  Administered 2021-02-04: 20 mg via INTRAVENOUS

## 2021-02-04 MED ORDER — STERILE WATER FOR INJECTION IJ SOLN
INTRAMUSCULAR | Status: DC | PRN
  Administered 2021-02-04: 10 mL via INTRAMUSCULAR

## 2021-02-04 MED ORDER — FENTANYL CITRATE (PF) 100 MCG/2ML IJ SOLN
INTRAMUSCULAR | Status: AC
  Filled 2021-02-04: qty 2

## 2021-02-04 MED ORDER — FENTANYL CITRATE (PF) 250 MCG/5ML IJ SOLN
INTRAMUSCULAR | Status: AC
  Filled 2021-02-04: qty 5

## 2021-02-04 MED ORDER — MIDAZOLAM HCL 2 MG/2ML IJ SOLN
INTRAMUSCULAR | Status: DC | PRN
  Administered 2021-02-04: 2 mg via INTRAVENOUS

## 2021-02-04 MED ORDER — LACTATED RINGERS IR SOLN
Status: DC | PRN
  Administered 2021-02-04: 1000 mL

## 2021-02-04 MED ORDER — HYDROMORPHONE HCL 1 MG/ML IJ SOLN
0.2500 mg | INTRAMUSCULAR | Status: DC | PRN
  Administered 2021-02-04 (×2): 0.5 mg via INTRAVENOUS

## 2021-02-04 MED ORDER — ROCURONIUM BROMIDE 10 MG/ML (PF) SYRINGE
PREFILLED_SYRINGE | INTRAVENOUS | Status: AC
  Filled 2021-02-04: qty 10

## 2021-02-04 MED ORDER — DIPHENHYDRAMINE HCL 12.5 MG/5ML PO ELIX: 12.5000 mg | ORAL_SOLUTION | Freq: Four times a day (QID) | ORAL | Status: DC | PRN

## 2021-02-04 MED ORDER — SODIUM CHLORIDE (PF) 0.9 % IJ SOLN
INTRAMUSCULAR | Status: DC | PRN
  Administered 2021-02-04: 20 mL

## 2021-02-04 MED ORDER — SODIUM CHLORIDE (PF) 0.9 % IJ SOLN
INTRAMUSCULAR | Status: AC
  Filled 2021-02-04: qty 20

## 2021-02-04 MED ORDER — MIDAZOLAM HCL 2 MG/2ML IJ SOLN
INTRAMUSCULAR | Status: AC
  Filled 2021-02-04: qty 2

## 2021-02-04 MED ORDER — ALVIMOPAN 12 MG PO CAPS
12.0000 mg | ORAL_CAPSULE | Freq: Two times a day (BID) | ORAL | Status: DC
  Administered 2021-02-05 – 2021-02-06 (×3): 12 mg via ORAL
  Filled 2021-02-04 (×4): qty 1

## 2021-02-04 MED ORDER — BUPIVACAINE LIPOSOME 1.3 % IJ SUSP
20.0000 mL | Freq: Once | INTRAMUSCULAR | Status: AC
  Administered 2021-02-04: 20 mL
  Filled 2021-02-04: qty 20

## 2021-02-04 MED ORDER — PHENYLEPHRINE HCL-NACL 10-0.9 MG/250ML-% IV SOLN
INTRAVENOUS | Status: DC | PRN
  Administered 2021-02-04: 50 ug/min via INTRAVENOUS

## 2021-02-04 MED ORDER — HYDROMORPHONE HCL 1 MG/ML IJ SOLN
INTRAMUSCULAR | Status: AC
  Administered 2021-02-04: 0.5 mg via INTRAVENOUS
  Filled 2021-02-04: qty 1

## 2021-02-04 MED ORDER — AMISULPRIDE (ANTIEMETIC) 5 MG/2ML IV SOLN: 10.0000 mg | Freq: Once | INTRAVENOUS | Status: DC | PRN

## 2021-02-04 MED ORDER — LACTATED RINGERS IV SOLN: INTRAVENOUS | Status: DC | PRN

## 2021-02-04 MED ORDER — LACTATED RINGERS IV SOLN: INTRAVENOUS | Status: DC

## 2021-02-04 MED ORDER — DEXAMETHASONE SODIUM PHOSPHATE 10 MG/ML IJ SOLN
INTRAMUSCULAR | Status: AC
  Filled 2021-02-04: qty 1

## 2021-02-04 MED ORDER — PROPOFOL 10 MG/ML IV BOLUS
INTRAVENOUS | Status: AC
  Filled 2021-02-04: qty 20

## 2021-02-04 MED ORDER — ONDANSETRON HCL 4 MG/2ML IJ SOLN
INTRAMUSCULAR | Status: DC | PRN
  Administered 2021-02-04: 4 mg via INTRAVENOUS

## 2021-02-04 MED ORDER — SENNOSIDES-DOCUSATE SODIUM 8.6-50 MG PO TABS
2.0000 | ORAL_TABLET | Freq: Every day | ORAL | Status: DC
  Administered 2021-02-06: 2 via ORAL
  Filled 2021-02-04 (×4): qty 2

## 2021-02-04 MED ORDER — ONDANSETRON HCL 4 MG/2ML IJ SOLN
INTRAMUSCULAR | Status: AC
  Filled 2021-02-04: qty 2

## 2021-02-04 MED ORDER — LIDOCAINE 2% (20 MG/ML) 5 ML SYRINGE
INTRAMUSCULAR | Status: DC | PRN
  Administered 2021-02-04: 50 mg via INTRAVENOUS

## 2021-02-04 MED ORDER — ONDANSETRON HCL 4 MG/2ML IJ SOLN: 4.0000 mg | INTRAMUSCULAR | Status: DC | PRN

## 2021-02-04 MED ORDER — SUGAMMADEX SODIUM 200 MG/2ML IV SOLN
INTRAVENOUS | Status: DC | PRN
  Administered 2021-02-04: 300 mg via INTRAVENOUS

## 2021-02-04 MED ORDER — PROSOURCE PLUS PO LIQD
30.0000 mL | Freq: Two times a day (BID) | ORAL | Status: DC
  Administered 2021-02-05 – 2021-02-08 (×5): 30 mL via ORAL
  Filled 2021-02-04 (×7): qty 30

## 2021-02-04 MED ORDER — ADULT MULTIVITAMIN LIQUID CH
15.0000 mL | Freq: Every day | ORAL | Status: DC
  Administered 2021-02-06 – 2021-02-08 (×3): 15 mL
  Filled 2021-02-04 (×6): qty 15

## 2021-02-04 MED ORDER — DEXAMETHASONE SODIUM PHOSPHATE 10 MG/ML IJ SOLN
INTRAMUSCULAR | Status: DC | PRN
  Administered 2021-02-04: 10 mg via INTRAVENOUS

## 2021-02-04 MED ORDER — PHENYLEPHRINE HCL (PRESSORS) 10 MG/ML IV SOLN
INTRAVENOUS | Status: AC
  Filled 2021-02-04: qty 1

## 2021-02-04 MED ORDER — PROPOFOL 10 MG/ML IV BOLUS
INTRAVENOUS | Status: DC | PRN
  Administered 2021-02-04: 120 mg via INTRAVENOUS

## 2021-02-04 MED ORDER — DEXTROSE-NACL 5-0.45 % IV SOLN: INTRAVENOUS | Status: DC

## 2021-02-04 MED ORDER — BOOST / RESOURCE BREEZE PO LIQD CUSTOM
1.0000 | Freq: Two times a day (BID) | ORAL | Status: DC
  Administered 2021-02-06 – 2021-02-09 (×3): 1 via ORAL

## 2021-02-04 MED ORDER — HYDROMORPHONE HCL 1 MG/ML IJ SOLN
0.5000 mg | INTRAMUSCULAR | Status: DC | PRN
  Administered 2021-02-05: 1 mg via INTRAVENOUS
  Administered 2021-02-05: 0.5 mg via INTRAVENOUS
  Administered 2021-02-05: 1 mg via INTRAVENOUS
  Filled 2021-02-04 (×3): qty 1

## 2021-02-04 SURGICAL SUPPLY — 110 items
ADH SKN CLS APL DERMABOND .7 (GAUZE/BANDAGES/DRESSINGS) ×2
AGENT HMST KT MTR STRL THRMB (HEMOSTASIS)
APL ESCP 34 STRL LF DISP (HEMOSTASIS)
APL PRP STRL LF DISP 70% ISPRP (MISCELLANEOUS) ×2
APL SKNCLS STERI-STRIP NONHPOA (GAUZE/BANDAGES/DRESSINGS)
APL SWBSTK 6 STRL LF DISP (MISCELLANEOUS) ×2
APPLICATOR COTTON TIP 6 STRL (MISCELLANEOUS) ×2 IMPLANT
APPLICATOR COTTON TIP 6IN STRL (MISCELLANEOUS) ×3
APPLICATOR SURGIFLO ENDO (HEMOSTASIS) IMPLANT
BAG LAPAROSCOPIC 12 15 PORT 16 (BASKET) ×2 IMPLANT
BAG RETRIEVAL 12/15 (BASKET) ×3
BAG URO CATCHER STRL LF (MISCELLANEOUS) ×3 IMPLANT
BENZOIN TINCTURE PRP APPL 2/3 (GAUZE/BANDAGES/DRESSINGS) IMPLANT
BLADE SURG SZ10 CARB STEEL (BLADE) IMPLANT
CATH SILICONE 5CC 18FR (INSTRUMENTS) ×3 IMPLANT
CELLS DAT CNTRL 66122 CELL SVR (MISCELLANEOUS) ×2 IMPLANT
CHLORAPREP W/TINT 26 (MISCELLANEOUS) ×3 IMPLANT
CLIP VESOLOCK LG 6/CT PURPLE (CLIP) ×8 IMPLANT
CLIP VESOLOCK MED LG 6/CT (CLIP) ×3 IMPLANT
CLIP VESOLOCK XL 6/CT (CLIP) ×6 IMPLANT
CLOTH BEACON ORANGE TIMEOUT ST (SAFETY) ×3 IMPLANT
CNTNR URN SCR LID CUP LEK RST (MISCELLANEOUS) ×2 IMPLANT
CONT SPEC 4OZ STRL OR WHT (MISCELLANEOUS) ×3
COVER SURGICAL LIGHT HANDLE (MISCELLANEOUS) ×3 IMPLANT
COVER TIP SHEARS 8 DVNC (MISCELLANEOUS) ×2 IMPLANT
COVER TIP SHEARS 8MM DA VINCI (MISCELLANEOUS) ×3
COVER WAND RF STERILE (DRAPES) IMPLANT
DECANTER SPIKE VIAL GLASS SM (MISCELLANEOUS) ×3 IMPLANT
DERMABOND ADVANCED (GAUZE/BANDAGES/DRESSINGS) ×1
DERMABOND ADVANCED .7 DNX12 (GAUZE/BANDAGES/DRESSINGS) ×4 IMPLANT
DRAIN CHANNEL RND F F (WOUND CARE) IMPLANT
DRAIN PENROSE 0.5X18 (DRAIN) IMPLANT
DRAPE ARM DVNC X/XI (DISPOSABLE) ×8 IMPLANT
DRAPE COLUMN DVNC XI (DISPOSABLE) ×2 IMPLANT
DRAPE DA VINCI XI ARM (DISPOSABLE) ×12
DRAPE DA VINCI XI COLUMN (DISPOSABLE) ×3
DRSG TEGADERM 4X4.75 (GAUZE/BANDAGES/DRESSINGS) IMPLANT
DRSG TEGADERM 6X8 (GAUZE/BANDAGES/DRESSINGS) ×1 IMPLANT
ELECT PENCIL ROCKER SW 15FT (MISCELLANEOUS) ×1 IMPLANT
ELECT REM PT RETURN 15FT ADLT (MISCELLANEOUS) ×3 IMPLANT
GAUZE 4X4 16PLY RFD (DISPOSABLE) IMPLANT
GLOVE SURG ENC MOIS LTX SZ6.5 (GLOVE) ×6 IMPLANT
GLOVE SURG ENC TEXT LTX SZ7.5 (GLOVE) ×9 IMPLANT
GLOVE SURG UNDER POLY LF SZ7.5 (GLOVE) IMPLANT
GOWN STRL REUS W/TWL LRG LVL3 (GOWN DISPOSABLE) ×15 IMPLANT
IRRIG SUCT STRYKERFLOW 2 WTIP (MISCELLANEOUS) ×3
IRRIGATION SUCT STRKRFLW 2 WTP (MISCELLANEOUS) ×2 IMPLANT
KIT PROCEDURE DA VINCI SI (MISCELLANEOUS)
KIT PROCEDURE DVNC SI (MISCELLANEOUS) IMPLANT
KIT TURNOVER KIT A (KITS) ×3 IMPLANT
LOOP VESSEL MAXI BLUE (MISCELLANEOUS) ×3 IMPLANT
MANIFOLD NEPTUNE II (INSTRUMENTS) ×3 IMPLANT
NDL ASPIRATION 22 (NEEDLE) ×2 IMPLANT
NDL INSUFFLATION 14GA 120MM (NEEDLE) ×2 IMPLANT
NEEDLE ASPIRATION 22 (NEEDLE) ×3 IMPLANT
NEEDLE INSUFFLATION 14GA 120MM (NEEDLE) ×3 IMPLANT
PACK CYSTO (CUSTOM PROCEDURE TRAY) ×3 IMPLANT
PACK ROBOT UROLOGY CUSTOM (CUSTOM PROCEDURE TRAY) ×3 IMPLANT
PAD POSITIONING PINK XL (MISCELLANEOUS) ×3 IMPLANT
PORT ACCESS TROCAR AIRSEAL 12 (TROCAR) ×2 IMPLANT
PORT ACCESS TROCAR AIRSEAL 5M (TROCAR) ×1
RELOAD STAPLE 60 2.6 WHT THN (STAPLE) ×6 IMPLANT
RELOAD STAPLE 60 4.1 GRN THCK (STAPLE) ×6 IMPLANT
RELOAD STAPLER GREEN 60MM (STAPLE) ×10 IMPLANT
RELOAD STAPLER WHITE 60MM (STAPLE) ×14 IMPLANT
RETRACTOR LONRSTAR 16.6X16.6CM (MISCELLANEOUS) IMPLANT
RETRACTOR STAY HOOK 5MM (MISCELLANEOUS) IMPLANT
RETRACTOR STER APS 16.6X16.6CM (MISCELLANEOUS)
RETRACTOR WND ALEXIS 18 MED (MISCELLANEOUS) ×2 IMPLANT
RTRCTR WOUND ALEXIS 18CM MED (MISCELLANEOUS) ×3
SEAL CANN UNIV 5-8 DVNC XI (MISCELLANEOUS) ×8 IMPLANT
SEAL XI 5MM-8MM UNIVERSAL (MISCELLANEOUS) ×12
SET TRI-LUMEN FLTR TB AIRSEAL (TUBING) ×3 IMPLANT
SOLUTION ELECTROLUBE (MISCELLANEOUS) ×3 IMPLANT
SPONGE LAP 18X18 RF (DISPOSABLE) ×6 IMPLANT
SPONGE LAP 4X18 RFD (DISPOSABLE) ×3 IMPLANT
STAPLER ECHELON LONG 60 440 (INSTRUMENTS) ×3 IMPLANT
STAPLER RELOAD GREEN 60MM (STAPLE) ×15
STAPLER RELOAD WHITE 60MM (STAPLE) ×21
STENT SET URETHERAL LEFT 7FR (STENTS) ×3 IMPLANT
STENT SET URETHERAL RIGHT 7FR (STENTS) ×3 IMPLANT
SURGIFLO W/THROMBIN 8M KIT (HEMOSTASIS) IMPLANT
SUT CHROMIC 4 0 RB 1X27 (SUTURE) ×3 IMPLANT
SUT ETHILON 3 0 PS 1 (SUTURE) ×3 IMPLANT
SUT MNCRL AB 4-0 PS2 18 (SUTURE) ×6 IMPLANT
SUT PDS AB 0 CTX 36 PDP370T (SUTURE) ×9 IMPLANT
SUT SILK 3 0 SH 30 (SUTURE) IMPLANT
SUT SILK 3 0 SH CR/8 (SUTURE) ×3 IMPLANT
SUT V-LOC BARB 180 2/0GR6 GS22 (SUTURE)
SUT VIC AB 2-0 CT1 27 (SUTURE)
SUT VIC AB 2-0 CT1 27XBRD (SUTURE) IMPLANT
SUT VIC AB 2-0 SH 18 (SUTURE) IMPLANT
SUT VIC AB 2-0 UR5 27 (SUTURE) ×12 IMPLANT
SUT VIC AB 3-0 SH 27 (SUTURE) ×18
SUT VIC AB 3-0 SH 27X BRD (SUTURE) ×4 IMPLANT
SUT VIC AB 3-0 SH 27XBRD (SUTURE) ×8 IMPLANT
SUT VIC AB 4-0 RB1 27 (SUTURE) ×12
SUT VIC AB 4-0 RB1 27XBRD (SUTURE) ×8 IMPLANT
SUT VLOC BARB 180 ABS3/0GR12 (SUTURE) ×3
SUTURE V-LC BRB 180 2/0GR6GS22 (SUTURE) IMPLANT
SUTURE VLOC BRB 180 ABS3/0GR12 (SUTURE) ×2 IMPLANT
SYR CONTROL 10ML LL (SYRINGE) IMPLANT
SYSTEM UROSTOMY GENTLE TOUCH (WOUND CARE) ×3 IMPLANT
TOWEL OR NON WOVEN STRL DISP B (DISPOSABLE) ×3 IMPLANT
TROCAR BLADELESS 15MM (ENDOMECHANICALS) ×3 IMPLANT
TROCAR XCEL NON-BLD 5MMX100MML (ENDOMECHANICALS) IMPLANT
TUBING CONNECTING 10 (TUBING) ×1 IMPLANT
WATER STERILE IRR 1000ML POUR (IV SOLUTION) ×3 IMPLANT
WATER STERILE IRR 3000ML UROMA (IV SOLUTION) ×3 IMPLANT
YANKAUER SUCT BULB TIP 10FT TU (MISCELLANEOUS) ×1 IMPLANT

## 2021-02-04 NOTE — Anesthesia Postprocedure Evaluation (Signed)
Anesthesia Post Note  Patient: Hunter [name]  Procedure(s) Performed: XI ROBOTIC ASSISTED LAPAROSCOPIC COMPLETE CYSTOPROSTATECTOMY ILEAL CONDUIT (N/A ) LYMPHADENECTOMY (Bilateral ) CYSTOSCOPY WITH INJECTION OF INDOCYANINE GREEN DYE (N/A )     Patient location during evaluation: PACU Anesthesia Type: General Level of consciousness: sedated and patient cooperative Pain management: pain level controlled Vital Signs Assessment: post-procedure vital signs reviewed and stable Respiratory status: spontaneous breathing Cardiovascular status: stable Anesthetic complications: no   No complications documented.  Last Vitals:  Vitals:   02/04/21 1600 02/04/21 1615  BP: 132/76 130/81  Pulse: 84 84  Resp: 13 17  Temp:    SpO2: 99% 99%    Last Pain:  Vitals:   02/04/21 1615  TempSrc:   PainSc: [place]

## 2021-02-04 NOTE — Progress Notes (Signed)
Initial Nutrition Assessment  DOCUMENTATION CODES:   Not applicable  INTERVENTION:  - will order Boost Breeze BID, each supplement provides 250 kcal and 9 grams of protein. - will order 30 ml Prosource Plus BID, each supplement provides 100 kcal and 15 grams protein.  - will order 1 tablet multivitamin with minerals/day. - complete NFPE at follow-up.    NUTRITION DIAGNOSIS:   Increased nutrient needs related to cancer and cancer related treatments,chronic illness,post-op healing as evidenced by estimated needs.  GOAL:   Patient will meet greater than or equal to 90% of their needs  MONITOR:   PO intake,Supplement acceptance,Diet advancement,Labs,Weight trends  REASON FOR ASSESSMENT:   Malnutrition Screening Tool  ASSESSMENT:   75 year-old male with medical history of bladder cancer, large renal mass, HLD, GERD, polio (L arm atrophy which is minimally functional), degenerative disc disease, CAD s/p CABG x4, hiatal hernia, HTN, gout, osteoarthritis, and skin cancer (melanoma). He presented to the hospital for planned cystoprostatectomy.  CLD ordered at the time of admission at 1805 yesterday and then changed to NPO at midnight; no intakes documented.   Weight today is 177 lb. Weight has been stable over the past 3 months. Weight on 10/29/20 was 183 lb. This indicates 6 lb weight loss (3.3% body weight).  Patient is currently out of the room to OR for planned cystoprostatectomy.   Patient was assessed by this RD on 11/14/20 at which time he was eating 50-100% of meals on Heart Healthy diet and accepting BID Ensure Enlive 100% of the time offered.    Labs reviewed; creatinine: 2.03 mg/dl, GFR: 34 ml/min. Medications reviewed.     NUTRITION - FOCUSED PHYSICAL EXAM:  unable to complete at this time.   Diet Order:   Diet Order            Diet clear liquid Room service appropriate? Yes; Fluid consistency: Thin  Diet effective now                 EDUCATION NEEDS:    Not appropriate for education at this time  Skin:  Skin Assessment: Reviewed RN Assessment  Last BM:  2/8  Height:   Ht Readings from Last 1 Encounters:  02/04/21 5\' 9"  (1.753 m)    Weight:   Wt Readings from Last 1 Encounters:  02/04/21 80.5 kg     Estimated Nutritional Needs:  Kcal:  2015-2225 kcal Protein:  90-105 grams Fluid:  >/= 2.5 L/day      Jarome Matin, MS, RD, LDN, CNSC Inpatient Clinical Dietitian RD pager # available in [facility]  After hours/weekend pager # available in [facility]

## 2021-02-04 NOTE — Progress Notes (Signed)
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 Prior high grade T1 disease that progressed. Staging CT 08/2020 clinically localized. Single ureters bilaterally. Completed 3 cycles neoadjuvant chemo by [facility] complicated by some significant anorexia. Restaging CT c/a/p 11/2020 with some new left mild malig hydro to bladder but no overt metastatic disease, stable left 73mm equivocal iliac not and some bladder dome stranding.   2 -Left Malignant Hydronephrosis / Renal Insuficiency - Cr 2's 11/2020 with new left hdyro to bladder. Baselien Cr <1.5.   Today " Mana " is seen to proceed with cystoprostatecotmy and conduit diversion. He complete bowel prep to clear. Hgb 11, Cr 2's (stable). Stomal marking complete.     Objective: Vital signs in last 24 hours: Temp:  [97.4 F (36.3 C)-98.4 F (36.9 C)] 98.2 F (36.8 C) (02/09 0652) Pulse Rate:  [70-89] 79 (02/09 0652) Resp:  [15-20] 18 (02/09 0652) BP: (113-128)/(64-83) 125/71 (02/09 0652) SpO2:  [99 %-100 %] 99 % (02/09 0652) Weight:  [80.5 kg] 80.5 kg (02/09 0703) Last BM Date: 02/03/21  Intake/Output from previous day: No intake/output data recorded. Intake/Output this shift: No intake/output data recorded.  Physical Exam Constitutional:      Comments: At baseline, in good spirits.  HENT:     Head: Normocephalic.     Nose: Nose normal.  Cardiovascular:     Comments: Well healed sternotimy scar. Rt sided port accessed and w/o site reaction.  Pulmonary:     Effort: Pulmonary effort is normal.  Abdominal:     General: Abdomen is flat. RLQ Urostomy site noted.  Musculoskeletal:     Cervical back: Normal range of motion.     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   Skin:    General: Skin is warm.  Neurological:     Mental Status: He is alert.  Psychiatric:        Mood and Affect: Mood normal.     Lab Results:  Recent Labs    02/03/21 1730  WBC 8.2  HGB 11.0*  HCT 34.1*  PLT 181   BMET Recent Labs    02/03/21 1730  NA 138  K 3.5  CL 107  CO2 22  GLUCOSE 93  BUN 22  CREATININE 2.03*  CALCIUM 9.0   PT/INR No results for input(s): LABPROT, INR in the last 72 hours. ABG No results for input(s): PHART, HCO3 in the last 72 hours.  Invalid input(s): PCO2, PO2  Studies/Results: No results found.  Anti-infectives: Anti-infectives (From admission, onward)   Start     Dose/Rate Route Frequency Ordered Stop   02/04/21 0800  piperacillin-tazobactam (ZOSYN) IVPB 2.25 g        2.25 g 100 mL/hr over 30 Minutes Intravenous On call to O.R. 02/03/21 1804 02/05/21 0559   02/03/21 1915  neomycin (MYCIFRADIN) tablet 500 mg        500 mg Oral Every 4 hours 02/03/21 1804 02/03/21 2210   02/03/21 1915  metroNIDAZOLE (FLAGYL) tablet 500 mg        500 mg Oral Every 4 hours 02/03/21 1804 02/03/21 2210   02/03/21 1657  piperacillin-tazobactam (ZOSYN) IVPB 3.375 g  Status:  Discontinued       Note to Pharmacy: 2.25g per Dr. Tresa Moore   3.375 g 100 mL/hr over 30 Minutes Intravenous 30 min pre-op 02/03/21 1657 02/03/21 1705  Assessment/Plan:  Proceed as planned with cysto/ICG injection and robotic cystoprostatectomy with conduit diversion / node dissection. WE have discussed risks, benefits, alternatives, expected peri-op course extensively previously and this was reiterated today.   Alexis Frock 02/04/2021

## 2021-02-04 NOTE — Anesthesia Procedure Notes (Signed)
Procedure Name: Intubation Date/Time: 02/04/2021 8:39 AM Performed by: Michele Rockers, CRNA Pre-anesthesia Checklist: Patient identified, Patient being monitored, Timeout performed, Emergency Drugs available and Suction available Patient Re-evaluated:Patient Re-evaluated prior to induction Oxygen Delivery Method: Circle System Utilized Preoxygenation: Pre-oxygenation with 100% oxygen Induction Type: IV induction Ventilation: Mask ventilation without difficulty Laryngoscope Size: Miller and 2 Grade View: Grade I Tube type: Oral Tube size: 8.0 mm Number of attempts: 1 Airway Equipment and Method: Stylet Placement Confirmation: ETT inserted through vocal cords under direct vision,  positive ETCO2 and breath sounds checked- equal and bilateral Secured at: 22 cm Tube secured with: Tape Dental Injury: Teeth and Oropharynx as per pre-operative assessment

## 2021-02-04 NOTE — Brief Op Note (Signed)
02/04/2021  3:48 PM  PATIENT:  Hunter [name]  75 y.o. male  PRE-OPERATIVE DIAGNOSIS:  BLADDER CANCER  POST-OPERATIVE DIAGNOSIS:  BLADDER CANCER  PROCEDURE:  Procedure(s) with comments: XI ROBOTIC ASSISTED LAPAROSCOPIC COMPLETE CYSTOPROSTATECTOMY ILEAL CONDUIT (N/A) - 6 HRS LYMPHADENECTOMY (Bilateral) CYSTOSCOPY WITH INJECTION OF INDOCYANINE GREEN DYE (N/A)  SURGEON:  Surgeon(s) and Role:    Alexis Frock, MD - Primary  PHYSICIAN ASSISTANT:   ASSISTANTS: Clemetine Marker PA   ANESTHESIA:   local and general  EBL:  150 mL   BLOOD ADMINISTERED:none  DRAINS: 1 - JP to bulb; 2 - RLQ Urostomy to gravity wtih Rt (red) and Lt (blue) bander stents.   LOCAL MEDICATIONS USED:  MARCAINE     SPECIMEN:  Source of Specimen:  1- pelvic lymph nodes; 2- ureteral margins; 3 - cystoprostatectomy  DISPOSITION OF SPECIMEN:  PATHOLOGY  COUNTS:  YES  TOURNIQUET:  * No tourniquets in log *  DICTATION: .Other Dictation: Dictation Number [id number]  PLAN OF CARE: Admit to inpatient   PATIENT DISPOSITION:  PACU - hemodynamically stable.   Delay start of Pharmacological VTE agent (>24hrs) due to surgical blood loss or risk of bleeding: yes

## 2021-02-04 NOTE — Transfer of Care (Signed)
Immediate Anesthesia Transfer of Care Note  Patient: Hunter [name]  Procedure(s) Performed: XI ROBOTIC ASSISTED LAPAROSCOPIC COMPLETE CYSTOPROSTATECTOMY ILEAL CONDUIT (N/A ) LYMPHADENECTOMY (Bilateral ) CYSTOSCOPY WITH INJECTION OF INDOCYANINE GREEN DYE (N/A )  Patient Location: PACU  Anesthesia Type:General  Level of Consciousness: awake, alert , oriented and patient cooperative  Airway & Oxygen Therapy: Patient Spontanous Breathing and Patient connected to face mask oxygen  Post-op Assessment: Report given to RN, Post -op Vital signs reviewed and stable and Patient moving all extremities  Post vital signs: Reviewed and stable  Last Vitals:  Vitals Value Taken Time  BP 134/75 02/04/21 1456  Temp    Pulse 83 02/04/21 1457  Resp 15 02/04/21 1457  SpO2 100 % 02/04/21 1457  Vitals shown include unvalidated device data.  Last Pain:  Vitals:   02/04/21 0702  TempSrc:   PainSc: 0-No pain         Complications: No complications documented.

## 2021-02-05 ENCOUNTER — Encounter (HOSPITAL_COMMUNITY): Payer: Self-pay | Admitting: Urology

## 2021-02-05 LAB — HEMOGLOBIN AND HEMATOCRIT, BLOOD
HCT: 28.5 % — ABNORMAL LOW (ref 39.0–52.0)
Hemoglobin: 9.5 g/dL — ABNORMAL LOW (ref 13.0–17.0)

## 2021-02-05 LAB — BASIC METABOLIC PANEL
Anion gap: 10 (ref 5–15)
BUN: 20 mg/dL (ref 8–23)
CO2: 22 mmol/L (ref 22–32)
Calcium: 8.4 mg/dL — ABNORMAL LOW (ref 8.9–10.3)
Chloride: 104 mmol/L (ref 98–111)
Creatinine, Ser: 1.71 mg/dL — ABNORMAL HIGH (ref 0.61–1.24)
GFR, Estimated: 41 mL/min — ABNORMAL LOW (ref >60)
Glucose, Bld: 186 mg/dL — ABNORMAL HIGH (ref 70–99)
Potassium: 3.9 mmol/L (ref 3.5–5.1)
Sodium: 136 mmol/L (ref 135–145)

## 2021-02-05 MED ORDER — ACETAMINOPHEN 500 MG PO TABS
1000.0000 mg | ORAL_TABLET | Freq: Four times a day (QID) | ORAL | Status: DC
Start: 2021-02-06 — End: 2021-02-05

## 2021-02-05 MED ORDER — ACETAMINOPHEN 500 MG PO TABS
1000.0000 mg | ORAL_TABLET | Freq: Four times a day (QID) | ORAL | Status: DC
  Administered 2021-02-05 – 2021-02-09 (×12): 1000 mg via ORAL
  Filled 2021-02-05 (×14): qty 2

## 2021-02-05 NOTE — Progress Notes (Addendum)
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 Prior high grade T1 disease that progressed. Staging CT 08/2020 clinically localized. Single ureters bilaterally. Completed 3 cycles neoadjuvant chemo by [facility] complicated by some significant anorexia. Restaging CT c/a/p 11/2020 with some new left mild malig hydro to bladder but no overt metastatic disease, stable left 66mm equivocal iliac not and some bladder dome stranding.   2 -Left Malignant Hydronephrosis / Renal Insuficiency - Cr 2's 11/2020 with new left hdyro to bladder. Baselien Cr <1.5.   Today " Hunter [name] " is seen on POD1 s/p cystoprostatecotmy and conduit diversion. He feels well this morning. Pain well controlled with oral medications. He is not passing flatus. Ostomy bag became full overnight and started leaking, will be changed this morning. Has not been up out of bed yet.  Drain output 165cc  Objective: Vital signs in last 24 hours: Temp:  [97.5 F (36.4 C)-98.6 F (37 C)] 98.6 F (37 C) (02/10 0517) Pulse Rate:  [81-90] 88 (02/10 0517) Resp:  [12-20] 20 (02/10 0517) BP: (113-137)/(73-84) 113/73 (02/10 0517) SpO2:  [95 %-100 %] 99 % (02/10 0517) Last BM Date: 02/04/21  Intake/Output from previous day: 02/09 0701 - 02/10 0700 In: 3075.1 [I.V.:3025.1; IV Piggyback:50] Out: 315 [Drains:165; Blood:150] Intake/Output this shift: No intake/output data recorded.  Physical Exam Constitutional:      Comments: At baseline, in good spirits this morning.  HENT:     Head: Normocephalic.     Nose: Nose normal.  Cardiovascular:     Comments: Well healed sternotimy scar. Rt sided port accessed and w/o site reaction.  Pulmonary:     Effort: Pulmonary effort is normal. On supplemental O2 Abdominal:     General: Abdomen is soft, mildly tender, incisions c/d/i. RLQ Urostomy pink and healthy with clear yellow urine. JP  drain with SS fluid.  Musculoskeletal:     Cervical back: Normal range of motion.     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   Skin:    General: Skin is warm.  Neurological:     Mental Status: He is alert.  Psychiatric:        Mood and Affect: Mood normal.    Lab Results:  Recent Labs    02/03/21 1730 02/04/21 1534 02/05/21 0409  WBC 8.2  --   --   HGB 11.0* 11.4* 9.5*  HCT 34.1* 35.0* 28.5*  PLT 181  --   --    BMET Recent Labs    02/03/21 1730 02/05/21 0409  NA 138 136  K 3.5 3.9  CL 107 104  CO2 22 22  GLUCOSE 93 186*  BUN 22 20  CREATININE 2.03* 1.71*  CALCIUM 9.0 8.4*   PT/INR No results for input(s): LABPROT, INR in the last 72 hours. ABG No results for input(s): PHART, HCO3 in the last 72 hours.  Invalid input(s): PCO2, PO2  Studies/Results: No results found.  Anti-infectives: Anti-infectives (From admission, onward)   Start     Dose/Rate Route Frequency Ordered Stop   02/04/21 0800  piperacillin-tazobactam (ZOSYN) IVPB 2.25 g        2.25 g 100 mL/hr over 30 Minutes Intravenous On call to O.R. 02/03/21 1804 02/04/21 0830   02/03/21 1915  neomycin (MYCIFRADIN) tablet 500 mg        500 mg Oral Every 4 hours 02/03/21 1804 02/03/21 2210  02/03/21 1915  metroNIDAZOLE (FLAGYL) tablet 500 mg        500 mg Oral Every 4 hours 02/03/21 1804 02/03/21 2210   02/03/21 1657  piperacillin-tazobactam (ZOSYN) IVPB 3.375 g  Status:  Discontinued       Note to Pharmacy: 2.25g per Dr. Tresa Moore   3.375 g 100 mL/hr over 30 Minutes Intravenous 30 min pre-op 02/03/21 1657 02/03/21 1705      Assessment/Plan: POD1 s/p cysto/ICG injection and robotic cystoprostatectomy with conduit diversion / node dissection.   - Continue NPO, IVF - Ostomy teaching today - Continue entereg until ROBF - OOB, IS - Please record all intake and output - [facility] daily labs - PT consult   [name] 02/05/2021

## 2021-02-05 NOTE — Progress Notes (Signed)
PCP on call was notified as the patient would like Tylenol to manage pain. Awaiting new orders.

## 2021-02-05 NOTE — Evaluation (Signed)
Physical Therapy Evaluation Patient Details Name: Hunter [name] MRN: [id number] DOB: [birth year] Today's Date: 02/05/2021   History of Present Illness  Patient is a 75 year old male PMH of bladder cancer on gemcitabine and cisplatin, melanoma, CAD s/p CABG 2013, chronic hyponatremia, hypertension, hyperlipidemia, gout, atrial fibrillation, recurrent staph epi UTIs who was admitted 02/04/21 for cystoprostatectomy.  Clinical Impression  Pt admitted with above diagnosis. Pt ambulated 6' with RW, no loss of balance. Good progress expected. Pt currently with functional limitations due to the deficits listed below (see PT Problem List). Pt will benefit from skilled PT to increase their independence and safety with mobility to allow discharge to the venue listed below.       Follow Up Recommendations No PT follow up    Equipment Recommendations  3in1 (PT)    Recommendations for Other Services       Precautions / Restrictions Precautions Precaution Comments: L JP drain; denies falls in past year Restrictions Weight Bearing Restrictions: No      Mobility  Bed Mobility Overal bed mobility: Needs Assistance Bed Mobility: Rolling;Sidelying to Sit Rolling: Min guard Sidelying to sit: Min assist       General bed mobility comments: VCs for log roll technique, min A to raise trunk (not able to assist using his LUE due to post-polio), used RUE on rail to assist with trunk elevation    Transfers Overall transfer level: Needs assistance Equipment used: Rolling walker (2 wheeled) Transfers: Sit to/from Stand Sit to Stand: From elevated surface;Min assist         General transfer comment: min A to place L hand on RW, VCs hand placement, min A to power up  Ambulation/Gait Ambulation/Gait assistance: Min guard Gait Distance (Feet): 90 Feet Assistive device: Rolling walker (2 wheeled) Gait Pattern/deviations: Step-through pattern;Decreased stride length Gait velocity: decr    General Gait Details: steady with RW, no loss of balance, pt reported mild dizziness  Stairs            Wheelchair Mobility    Modified Rankin (Stroke Patients Only)       Balance Overall balance assessment: Modified Independent                                           Pertinent Vitals/Pain Pain Assessment: 0-10 Pain Score: 6  Pain Location: lower abdomen incision site Pain Descriptors / Indicators: Grimacing Pain Intervention(s): Limited activity within patient's tolerance;Monitored during session;Premedicated before session    Home Living Family/patient expects to be discharged to:: Private residence Living Arrangements: Spouse/significant other Available Help at Discharge: Family;Available 24 hours/day;Friend(s) Type of Home: House Home Access: Stairs to enter   [employer] of Steps: 1 Home Layout: Multi-level;Other (Comment) Home Equipment: Shower seat - built in Additional Comments: patient can stay on main level once dressed in AM, only goes upstairs to sleep    Prior Function Level of Independence: Independent         Comments: Playing golf just prior to surgery     Hand Dominance        Extremity/Trunk Assessment   Upper Extremity Assessment Upper Extremity Assessment: LUE deficits/detail LUE Deficits / Details: LUE affected by polio as a 64 month old, required assist to place LUE on RW, minimal grip strength (trace movement of fingers into grip position)    Lower Extremity Assessment Lower Extremity Assessment: Overall WFL for tasks  assessed    Cervical / Trunk Assessment Cervical / Trunk Assessment: Normal  Communication   Communication: No difficulties  Cognition Arousal/Alertness: Awake/alert Behavior During Therapy: WFL for tasks assessed/performed Overall Cognitive Status: Within Functional Limits for tasks assessed                                        General Comments       Exercises     Assessment/Plan    PT Assessment Patient needs continued PT services  PT Problem List Decreased mobility;Decreased activity tolerance;Decreased knowledge of use of DME       PT Treatment Interventions Therapeutic activities;Therapeutic exercise;Functional mobility training;Gait training;DME instruction;Patient/family education    PT Goals (Current goals can be found in the Care Plan section)  Acute Rehab PT Goals Patient Stated Goal: golf PT Goal Formulation: With patient/family Time For Goal Achievement: 02/19/21 Potential to Achieve Goals: Good    Frequency Min 3X/week   Barriers to discharge        Co-evaluation               AM-PAC PT "6 Clicks" Mobility  Outcome Measure Help needed turning from your back to your side while in a flat bed without using bedrails?: A Little Help needed moving from lying on your back to sitting on the side of a flat bed without using bedrails?: A Little Help needed moving to and from a bed to a chair (including a wheelchair)?: A Little Help needed standing up from a chair using your arms (e.g., wheelchair or bedside chair)?: A Little Help needed to walk in hospital room?: A Little Help needed climbing 3-5 steps with a railing? : A Little 6 Click Score: 18    End of Session Equipment Utilized During Treatment: Gait belt Activity Tolerance: Patient tolerated treatment well Patient left: in chair;with call bell/phone within reach;with chair alarm set;with family/visitor present Nurse Communication: Mobility status PT Visit Diagnosis: Difficulty in walking, not elsewhere classified (R26.2);Pain;Muscle weakness (generalized) (M62.81)    Time: 5427-0623 PT Time Calculation (min) (ACUTE ONLY): 26 min   Charges:   PT Evaluation $PT Eval Low Complexity: 1 Low PT Treatments $Gait Training: 8-22 mins        Blondell Reveal Kistler PT 02/05/2021  Acute Rehabilitation Services Pager [phone] Office  [phone]

## 2021-02-05 NOTE — Op Note (Signed)
Hunter [name], Hunter [name] MEDICAL RECORD [id number] ACCOUNT [id number] DATE OF BIRTH:1946-03-25 FACILITY: [facility] LOCATION: [facility] PHYSICIAN:Jakobi Thetford, MD  OPERATIVE REPORT  DATE OF PROCEDURE:  02/04/2021  PREOPERATIVE DIAGNOSIS:  Muscle invasive bladder cancer.  PROCEDURE: 1.  Cystoscopy with injection of indocyanine green dye. 2.  Robotic-assisted laparoscopic radical cystectomy with prostatectomy. 3.  Bilateral pelvic node dissection. 4.  Ileal conduit urinary diversion.  ESTIMATED BLOOD LOSS:  150 mL.  COMPLICATIONS:  None.  ASSISTANT:  Debbrah Alar, PA  FINDINGS: 1.  Trabeculated bladder with multiple diverticula, minimal intraluminal tumor with cystoscopy. 2.  Sentinel lymph nodes in the right hemipelvis denoted on pathology requisition. 3.  Some extravesical tissue lateral to the left pedicle somewhat concerning for extravesical disease.  Additional margins taken.  DRAINS: 1.  Jackson-Pratt drain to low suction. 2.  Right lower quadrant urostomy to gravity drainage with right (red), left (blue) bander stents to gravity drainage.  SPECIMENS: 1.  Bladder plus prostate en bloc. 2.  Right external iliac lymph nodes, sentinel. 3.  Right obturator lymph nodes, sentinel. 4.  Right common iliac lymph nodes. 5.  Aortic bifurcation lymph nodes. 6.  Left common iliac lymph nodes. 7.  Left external plus internal iliac lymph nodes. 8.  Left obturator lymph nodes. 9.  Additional left perivesical tissue. 10.  Bilateral distal ureteral margins for frozen section, negative.  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  He was found to have significant bladder cancer that was muscle invasive  on transurethral  resection.  Initial staging imaging was localized.  He is in the midst of curative intent path with neoadjuvant chemotherapy, which he tolerated marginally  followed by cystoprostatectomy.  He completed 3 cycles of neoadjuvant chemotherapy and although it  took him a while, he is back to baseline.  He underwent restaging imaging, which did not reveal any overt locally advanced or distant disease.  He wished to proceed with cystoprostatectomy with urinary diversion as part of his curative intent pathway.   He was admitted yesterday for bowel prep, stomal marking and labs, which revealed unstable creatinine at 2 due to known mild to moderate left malignant obstruction.  He wished to proceed.  Informed consent was obtained and placed in medical record.  DESCRIPTION OF PROCEDURE:  The patient being himself verified, procedure being cystoscopy with injection of indocyanine green dye and robotic-assisted prostatectomy with ileal conduit diversion was confirmed.  Procedure timeout was performed.   Intravenous antibiotics administered.  General endotracheal anesthesia induced.  The patient was placed into a low lithotomy position.  Sterile field was created, prepping and draping the patient's penis, perineum and proximal thighs using iodine and his  infra-xiphoid abdomen using chlorhexidine gluconate.  After he was further fastened to operative table using 3-inch tape over foam padding across the supraxiphoid chest, his arms were tucked to the side and padded using gel rolls.  A test of steep  Trendelenburg positioning was performed and found to be suitably positioned.  Foley catheter was placed free to straight drain with silicone type catheter.  Next, a high-flow, low-pressure pneumoperitoneum was obtained using Veress technique in the  supraumbilical midline having passed the aspiration and drop test.  An 8 mm robotic camera port was then placed in same location.  Laparoscopic examination of the peritoneal cavity revealed minimal pelvic adhesions.  Additional ports were placed as  follows:  Right paramedian 8 mm robotic port, right  paramedian 15 mm assistant port at  the previously marked stomal site, right far lateral 12 mm AirSeal port, left paramedian 8 mm robotic port, left far lateral 8 mm robotic port.  Robot was docked and  passed electronic checks.  Simultaneously,  cystourethroscopy was performed using a 24-French injection scope set with 0-degree lens.  Inspection of anterior and posterior urethra was unremarkable.  Inspection of bladder revealed some mild papillary  changes at the trigone.  Bladder was overall fairly trabeculated.  There was minimal intraluminal tumor and 2 mL of indocyanine green dye was injected across approximately 6 submucosal blebs in the intertrigone area and a new silicone type Foley catheter  was placed free to straight drain.  Via robotic approach, initial attention was directed at left retroperitoneal dissection.  Incision was made lateral to the descending colon from the area of the iliac crossing superiorly distance of approximately 10  cm and inferiorly coursing lateral to the left medial umbilical ligament towards the anterior abdominal wall.  This created a very large retroperitoneal flap, which was used to rotate the descending colon medially.  Left ureter was encountered as it  coursed over the iliac vessels with marked vessel loop, dissected proximally a distance of approximately 8 cm and then distally to the ureterovesical junction, which was very carefully doubly clipped and ligated.  Proximal clip containing a dyed tag  suture.  Frozen section negative for carcinoma and left ureters tucked out of the true pelvis.  Attention was directed at left-sided pelvic lymph node dissection.  On using near infrared fluorescence light, there was no obvious sentinel lymph nodes in  the left hemipelvis.  As such, standard template lymphadenectomy was performed first on the left common iliac group from the area of the aortic bifurcation to the iliac bifurcation.  This was started with left common iliac lymph nodes and the left  external  and internal iliac lymph nodes with the boundaries being of the external iliac artery, and internal iliac artery, and left external iliac vein.  Lymphostasis was achieved with cold clips and finally the left obturator group was dissected free  with the boundaries being left external iliac vein, obturator nerve.  Lymphostasis was achieved with cold clips.  Left obturator nerve was inspected following maneuvers and found to be uninjured.  Dissection then proceeded inferiorly towards the  endopelvic fascia, which was swept away from the lateral aspect of the prostate on the left side towards the area of the apex of the prostate, which exposed the lateral edge of the dorsal venous complex on the left side.  This completed the left-sided  retroperitoneal dissection.  Attention was then directed at the right-sided dissection.  The ileocecal junction was identified as verified by terminal ileum entering the cecum just opposite of the appendix, which was visually normal and the distal ileum  was traced proximally for a distance of approximately 14 cm and a tagged suture clip of silk was placed at the epiploic fat with a distal clip to this to denote proximal distal orientation.  An incision was made lateral to the ascending colon from the  area of the cecum superiorly a distance of approximately 8 cm and then inferiorly coursing lateral to the right medial umbilical ligament to the area of the anterior abdominal wall.  Next, a retroperitoneal incision was extended with a Y shape and a  counter incision coursing along the right common iliac vessels towards the area of the aortic bifurcation.  The right ureter was encountered  in the retroperitoneum as it coursed over the right iliac vessels, marked a vessel loop, dissected proximally a  distance of approximately 6 cm and distally to the ureterovesical junction, which was doubly clipped and ligated the proximal clip containing a undyed tag suture.  Frozen section  negative for carcinoma.  The right ureter was tucked out of the true  pelvis.  Attention was directed at right-sided lymphadenectomy, first over the right external iliac group, then of the right obturator group.  There was sentinel lymph nodes within these packets which were completely removed and labeled as such.  The  right obturator nerve was inspected following maneuvers and found to be uninjured.  Next, the right common iliac group was dissected free from the aortic bifurcation to the iliac bifurcation and additional lymphatic material was taken from the area of  the aortic bifurcation.  Lymphostasis was achieved with cold clips.  The retroperitoneal window was then continued just anterior to the aortic bifurcation and carried to the left hemipelvis and the left ureter was brought through this retroperitoneal  window to the right side and the right ureter, left ureter, terminal ileum tag sutures were placed into a single clip for later manipulation during urinary diversion.  Similarly, the right lateral bladder was dissected away from the pelvic sidewall  towards the area of the endopelvic fascia, which was swept away from the lateral aspect of the prostate towards the area of the apex of the prostate.  Posterior dissection was performed by connecting the 2 previous lateral retroperitoneal incisions by  performing an inverted U incision on the bladder and creating a posterior peritoneal flap, which was anterior to the rectum and this plane was carried down to the prostatic apex and the plane posterior to the vas and seminal vesicles and this posterior  peritoneal flap was then swept laterally, thus exposing the vascular pedicles of the bladder and prostate on each side.  These were controlled using a sequential clipping technique x3 vascular load each side, taking exquisite care to avoid any injury to  the great vessels, obturator nerve or bladder or prostate.  This resulted in excellent hemostatic  control of the bilateral bladder and prostate pedicles.  Attention was then directed at anterior dissection and the space of Retzius was developed between  the medial umbilical ligaments from the umbilicus towards the area of the dorsal venous complex, was controlled using a green load stapler, taking exquisite care to avoid membranous urethral injury which did not occur.  Final apical dissection was  performed in the anterior plane placing the bladder and prostate on superior traction, transecting the anterior 50% of the membranous urethra coldly placing 2 extra-large Hem-o-Lok clips on the in situ Foley catheter, which was purposely ligated and used  as a bucket handle as the posterior 50% of the membranous urethra was transected.  An additional retrograde dissection was performed off the anterior and posterior prostate.  This completely freed up the bladder plus prostate.  En bloc specimen was  placed in EndoCatch bag for later retrieval.  Under direct laparoscopic vision, digital rectal exam was performed using indicator glove and no evidence of rectal violation was noted.  With careful inspection of the pelvis on the left side, there appeared  to be some tissue in the area of the prior left pedicle, which was somewhat concerning for possible extravesical disease and residual tumor, although certainly not definitive.  This area was very carefully dissected away from the remaining pedicle  tissue labeled as  additional left perivesical tissue.  An additional digital rectal exam was performed and under laparoscopic vision, no evidence of rectal violation was noted despite additional dissection of the left pedicle.  The membranous urethra was  then oversewn using running 3-0 V-Loc to prevent prolonged penile drainage.  At this point, hemostasis was excellent.  Sponge, needle counts were correct.  We completed extirpative portions of the procedure today.  A closed suction drain was brought up  of the previous  left most lateral site near the peritoneal cavity.  The specimen bag string was brought to the left paramedian robotic port site and the right ureter, left ureter, terminal ileum tag sutures were grasped with a laparoscopic grasper via  the right paramedian robotic port site such that they did not cross with the specimen bag.  Robot was then undocked.  The patient was leveled and specimen was retrieved by extending the previous camera port site inferiorly erring to the left side of the  umbilicus for a distance of approximately 6 cm, removing the bladder plus prostate specimen en bloc setting aside for permanent pathology.  A wound protector type retractor was then placed via this extraction site.  Attention was directed at urinary  diversion.  The right ureter, left ureter, terminal ileum tag sutures were brought through this site and tagged with some Babcock type clamps, three gently.  A segment of distal ileum 14 cm in length was taken out of continuity using a green load stapler  proximal and distal at the previously marked site.  The mesentery was developed using 2 loads of white stapler proximal and 1 load distal and taking exquisite care to avoid devascularization of the mesenteric or conduit segments.  Conduit segment was  then aligned to retroperitoneal orientation and bowel-bowel anastomosis was performed using 1.5 loads of the green load stapler on the antimesenteric border and the free end was oversewn using running silk, followed by a second imbricating layer of  running silk.  The acute angle of the anastomosis was bolstered using interrupted silk.  Mesenteric defect was closed using interrupted silk.  The bowel-bowel anastomosis was visibly viable and palpably patent and redelivered into the abdominal cavity.   The distal staple line and conduit segment was removed.  The proximal staple line was oversewn using a 3-0 Vicryl to prevent stone formation.  Attention was directed at ureteroenteric  anastomosis, first on the right side.  An approximately 4 mm segment  of proximal conduit was excised using Potts scissors.  Four mucosal everting sutures of 4-0 Vicryl were placed.  The right ureter was trimmed to length and spatulated for a distance of approximately 1 cm with a final right distal ureteral margin being  sent for permanent pathology.  A Heel stitch of 4-0 Vicryl was applied.  A right bander colored stent was advanced across this to 26 cm anastomosis and ureteroenteric anastomosis was very carefully performed using 2 separate running suture lines of 4-0  Vicryl, which revealed an excellent tension-free apposition.  A 4-0 chromic suture was used to anchor the right bander stent to the mid portion of the conduit segment using a purposeful air knot to prevent inadvertent early distal migration.  Left ureter  was anastomosed as per the right, but on the contralateral side of the conduit again using mucosal everting sutures.  A heel stitch and 2 separate running suture lines of 4-0 Vicryl, but placing a blue colored bander stent to 26 cm anastomosis.  A final  left ureteral margin  was also sent.  I was quite happy with the geometry of the ureteroenteric anastomosis, the conduit remained visibly very pink and viable.  The previously marked stomal site was further developed by excising a quarter-sized diameter  column of skin and fibrofatty tissue to the level of fascia, which was dilated to accommodate 2 surgeon's fingers and 4-quadrant 2-0 Vicryl sutures were placed at this location.  The conduit was then brought through this area and exquisite care to avoid  twisting of the mesentery and the previous 4 fascial sutures were used to anchor the conduit at the proximal level to prevent parastomal hernia formation.  These same 4 sutures were used to provide rosebudding sutures in interrupted fashion quadrantly.   The abdomen was once again inspected via the extraction site.  Hemostasis was excellent.   Sponge, needle counts were correct.  There was a paucity of omentum to be placed over the extraction site, which was then reapproximated using interrupted PDS x6  followed by reapproximation of Scarpa's with running Vicryl.  All incision sites were infiltrated with lipolyzed Marcaine and closed at the level of the skin using subcuticular modified Dermabond.  Final maturation of the stoma was performed by tying  down the rosebudding sutures, which revealed an excellent rose budding of the stoma, which again was visibly viable and very pink and further conduit mucosa to skin reapproximation was performed in a quadrant fashion using interrupted Vicryl x2 each  quadrant.  Stoma appliance was placed.  Dermabond was applied.  Procedure was terminated.  The patient tolerated the procedure well.  No immediate complications.  The patient was taken to the postanesthesia care unit in stable condition with plan for  progressive care admission.  Please note, first assistant Debbrah Alar was crucial for all portions of surgery today.  She provided invaluable retraction, suctioning, specimen manipulation, vascular clipping, vascular stapling, robotic instrument transition and general first  assistance.  IN/NUANCE  D:02/04/2021 T:02/05/2021 JOB:[id number]

## 2021-02-06 LAB — BASIC METABOLIC PANEL
Anion gap: 7 (ref 5–15)
BUN: 18 mg/dL (ref 8–23)
CO2: 23 mmol/L (ref 22–32)
Calcium: 8.4 mg/dL — ABNORMAL LOW (ref 8.9–10.3)
Chloride: 108 mmol/L (ref 98–111)
Creatinine, Ser: 1.43 mg/dL — ABNORMAL HIGH (ref 0.61–1.24)
GFR, Estimated: 51 mL/min — ABNORMAL LOW (ref >60)
Glucose, Bld: 121 mg/dL — ABNORMAL HIGH (ref 70–99)
Potassium: 3.3 mmol/L — ABNORMAL LOW (ref 3.5–5.1)
Sodium: 138 mmol/L (ref 135–145)

## 2021-02-06 LAB — HEMOGLOBIN AND HEMATOCRIT, BLOOD
HCT: 25.8 % — ABNORMAL LOW (ref 39.0–52.0)
Hemoglobin: 8.4 g/dL — ABNORMAL LOW (ref 13.0–17.0)

## 2021-02-06 MED ORDER — HEPARIN SODIUM (PORCINE) 5000 UNIT/ML IJ SOLN
5000.0000 [IU] | Freq: Three times a day (TID) | INTRAMUSCULAR | Status: DC
  Administered 2021-02-06 – 2021-02-10 (×13): 5000 [IU] via SUBCUTANEOUS
  Filled 2021-02-06 (×13): qty 1

## 2021-02-06 NOTE — Consult Note (Signed)
Hunter [name] ostomy consult note Stoma type/location: RLQ ileal conduit.  Patient has left sided impaired hand dexterity due to polio. .Wife will be aiding in ostomy care.  She is taping the session today and patient and family are participative in care.  Stomal assessment/size: 1" pink and moist Red and blue stents intact and draining blood tinged liquid.  Nearly 843mL in bedside drainage bag noted.  Peristomal assessment: intact  Barrier ring used Treatment options for stomal/peristomal skin: Barrier ring and 1 piece convex pouch Output blood tinged liquid Ostomy pouching: 1pc.convex.  Education provided: POuch change performed with patient and wife  Discussed twice weekly pouch changes.  Spout opening in off and on positions.  (on when connected to bedside drainage. ) CLeansed skin and pat dry.  Applied barrier ring and cut pouch to fit. Applied pouch and reconnected to bedside drainage.   Enrolled patient in [place] program: No Will follow.  Hunter Moras MSN, RN, FNP-BC CWON Wound, Ostomy, Continence [name] Pager [phone]

## 2021-02-06 NOTE — Plan of Care (Signed)
  Problem: Clinical Measurements: Goal: Will remain free from infection Outcome: Completed/Met

## 2021-02-06 NOTE — Progress Notes (Signed)
2 Days Post-Op   Subjective/Chief Complaint:   1 - Muscle Invasive Bladder Cancer - T2G3 bladder cacner with some CIS (negative prostaticurethrao BX) by TURBT 07/2020. Prior high grade T1 disease that progressed. Staging CT 08/2020 clinically localized. Single ureters bilaterally. Completed 3 cycles neoadjuvant chemo by [facility] complicated by some significant anorexia. Restaging CT c/a/p 11/2020 with some new left mild malig hydro to bladder but no overt metastatic disease, stable left 76mm equivocal iliac not and some bladder dome stranding.   2 -Left Malignant Hydronephrosis / Renal Insuficiency - Cr 2's 11/2020 with new left hdyro to bladder. Baselien Cr <1.5.   Today " Hunter [name] " is seen on PODs s/p cystoprostatecotmy and conduit diversion. He is doing well. Reports that he walked down the halls yesterday and was in chair for 3 hours. Still some tenderness when he coughs. No flauts. No nausea or emesis.   Urine outpud 2.5L Drain output 150cc  Objective: Vital signs in last 24 hours: Temp:  [98.4 F (36.9 C)] 98.4 F (36.9 C) (02/11 0530) Pulse Rate:  [66-70] 66 (02/11 0530) Resp:  [17-21] 17 (02/11 0530) BP: (109-122)/(61-71) 109/65 (02/11 0530) SpO2:  [98 %-100 %] 98 % (02/11 0530) Last BM Date: 02/04/21  Intake/Output from previous day: 02/10 0701 - 02/11 0700 In: 3109.7 [P.O.:480; I.V.:2629.7] Out: 2700 [Urine:2550; Drains:150] Intake/Output this shift: No intake/output data recorded.  Physical Exam Constitutional:      Comments: At baseline, in good spirits this morning.  HENT:     Head: Normocephalic.     Nose: Nose normal.  Cardiovascular:     Comments: Well healed sternotimy scar. Rt sided port accessed and w/o site reaction.  Pulmonary:     Effort: Pulmonary effort is normal. On supplemental O2 Abdominal:     General: Abdomen is soft, mildly tender, incisions c/d/i. RLQ Urostomy pink and healthy with clear yellow urine. Stents in place. JP drain with SS fluid.   Musculoskeletal:     Cervical back: Normal range of motion.     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   Skin:    General: Skin is warm.  Neurological:     Mental Status: He is alert.  Psychiatric:        Mood and Affect: Mood normal.    Lab Results:  Recent Labs    02/03/21 1730 02/04/21 1534 02/05/21 0409 02/06/21 0450  WBC 8.2  --   --   --   HGB 11.0*   < > 9.5* 8.4*  HCT 34.1*   < > 28.5* 25.8*  PLT 181  --   --   --    < > = values in this interval not displayed.   BMET Recent Labs    02/05/21 0409 02/06/21 0450  NA 136 138  K 3.9 3.3*  CL 104 108  CO2 22 23  GLUCOSE 186* 121*  BUN 20 18  CREATININE 1.71* 1.43*  CALCIUM 8.4* 8.4*   PT/INR No results for input(s): LABPROT, INR in the last 72 hours. ABG No results for input(s): PHART, HCO3 in the last 72 hours.  Invalid input(s): PCO2, PO2  Studies/Results: No results found.  Anti-infectives: Anti-infectives (From admission, onward)   Start     Dose/Rate Route Frequency Ordered Stop   02/04/21 0800  piperacillin-tazobactam (ZOSYN) IVPB 2.25 g        2.25 g 100 mL/hr over 30 Minutes Intravenous On call to O.R. 02/03/21 1804 02/04/21 0830   02/03/21 1915  neomycin [facility]) tablet 500 mg        500 mg Oral Every 4 hours 02/03/21 1804 02/03/21 2210   02/03/21 1915  metroNIDAZOLE (FLAGYL) tablet 500 mg        500 mg Oral Every 4 hours 02/03/21 1804 02/03/21 2210   02/03/21 1657  piperacillin-tazobactam (ZOSYN) IVPB 3.375 g  Status:  Discontinued       Note to Pharmacy: 2.25g per Dr. Tresa Moore   3.375 g 100 mL/hr over 30 Minutes Intravenous 30 min pre-op 02/03/21 1657 02/03/21 1705      Assessment/Plan: POD2 s/p cysto/ICG injection and robotic cystoprostatectomy with conduit diversion / node dissection.   -  Advance to clears, decrease IVF to 32 - Ostomy teaching today - Continue entereg until ROBF - OOB, IS - Montior daily labs  - PT consult - SQH for ppx   [name] 02/06/2021

## 2021-02-06 NOTE — Care Management Important Message (Signed)
Medicare IM printed for Hunter [name] to give to the patient.  

## 2021-02-07 LAB — BASIC METABOLIC PANEL
Anion gap: 7 (ref 5–15)
BUN: 14 mg/dL (ref 8–23)
CO2: 23 mmol/L (ref 22–32)
Calcium: 8.2 mg/dL — ABNORMAL LOW (ref 8.9–10.3)
Chloride: 109 mmol/L (ref 98–111)
Creatinine, Ser: 1.31 mg/dL — ABNORMAL HIGH (ref 0.61–1.24)
GFR, Estimated: 57 mL/min — ABNORMAL LOW (ref >60)
Glucose, Bld: 119 mg/dL — ABNORMAL HIGH (ref 70–99)
Potassium: 3 mmol/L — ABNORMAL LOW (ref 3.5–5.1)
Sodium: 139 mmol/L (ref 135–145)

## 2021-02-07 LAB — BPAM RBC
Blood Product Expiration Date: 202203072359
Blood Product Expiration Date: 202203072359
Unit Type and Rh: 5100
Unit Type and Rh: 5100

## 2021-02-07 LAB — TYPE AND SCREEN
ABO/RH(D): O POS
Antibody Screen: NEGATIVE
Unit division: 0
Unit division: 0

## 2021-02-07 LAB — HEMOGLOBIN AND HEMATOCRIT, BLOOD
HCT: 27.5 % — ABNORMAL LOW (ref 39.0–52.0)
Hemoglobin: 9.1 g/dL — ABNORMAL LOW (ref 13.0–17.0)

## 2021-02-07 NOTE — Progress Notes (Signed)
3 Days Post-Op Subjective: No acute events overnight.  He had a bowel movement last night and feels like he needs to have one again this morning.  He denies nausea or emesis.  Pain controlled.  Ambulating without difficulty.  Objective: Vital signs in last 24 hours: Temp:  [97.7 F (36.5 C)-98.5 F (36.9 C)] 97.7 F (36.5 C) (02/12 0448) Pulse Rate:  [69-81] 69 (02/12 0448) Resp:  [16-20] 16 (02/12 0448) BP: (123-128)/(64-75) 127/75 (02/12 0448) SpO2:  [98 %-99 %] 99 % (02/12 0448)  Intake/Output from previous day: 02/11 0701 - 02/12 0700 In: 2428.6 [P.O.:480; I.V.:1948.6] Out: 2850 [Urine:2790; Drains:60]  Intake/Output this shift: Total I/O In: 240 [P.O.:240] Out: -   Physical Exam:  General: Alert and oriented CV: RRR, palpable distal pulses Lungs: CTAB, equal chest rise Abdomen: Soft, NTND, no rebound or guarding.  JP drain with serosanguineous output Incisions: Clean, dry and intact Gu: Ileostomy is draining slightly blood-tinged urine with bilateral stents in place Ext: NT, No erythema  Lab Results: Recent Labs    02/05/21 0409 02/06/21 0450 02/07/21 0412  HGB 9.5* 8.4* 9.1*  HCT 28.5* 25.8* 27.5*   BMET Recent Labs    02/06/21 0450 02/07/21 0412  NA 138 139  K 3.3* 3.0*  CL 108 109  CO2 23 23  GLUCOSE 121* 119*  BUN 18 14  CREATININE 1.43* 1.31*  CALCIUM 8.4* 8.2*     Studies/Results: No results found.  Assessment/Plan: POD3 s/p cysto/ICG injection and robotic cystoprostatectomy with conduit diversion / node dissection.   -Advance diet as tolerated -OOBTC and ambulate -Heparin for DVT prophylaxis -Continue IV fluids--renal function stable -Making excellent progress.  Will continue to monitor over the weekend.   LOS: 4 days   Ellison Hughs, MD [facility] Pager: [phone]  02/07/2021, 10:51 AM

## 2021-02-08 LAB — BASIC METABOLIC PANEL
Anion gap: 7 (ref 5–15)
BUN: 13 mg/dL (ref 8–23)
CO2: 23 mmol/L (ref 22–32)
Calcium: 7.9 mg/dL — ABNORMAL LOW (ref 8.9–10.3)
Chloride: 108 mmol/L (ref 98–111)
Creatinine, Ser: 1.49 mg/dL — ABNORMAL HIGH (ref 0.61–1.24)
GFR, Estimated: 49 mL/min — ABNORMAL LOW (ref >60)
Glucose, Bld: 108 mg/dL — ABNORMAL HIGH (ref 70–99)
Potassium: 2.8 mmol/L — ABNORMAL LOW (ref 3.5–5.1)
Sodium: 138 mmol/L (ref 135–145)

## 2021-02-08 LAB — HEMOGLOBIN AND HEMATOCRIT, BLOOD
HCT: 26.7 % — ABNORMAL LOW (ref 39.0–52.0)
Hemoglobin: 8.8 g/dL — ABNORMAL LOW (ref 13.0–17.0)

## 2021-02-08 MED ORDER — POTASSIUM CHLORIDE 20 MEQ PO PACK
20.0000 meq | PACK | Freq: Two times a day (BID) | ORAL | Status: DC
  Administered 2021-02-08 – 2021-02-10 (×5): 20 meq via ORAL
  Filled 2021-02-08 (×5): qty 1

## 2021-02-08 MED ORDER — CALCIUM CARBONATE ANTACID 500 MG PO CHEW
1.0000 | CHEWABLE_TABLET | Freq: Two times a day (BID) | ORAL | Status: DC
  Administered 2021-02-08 – 2021-02-09 (×3): 200 mg via ORAL
  Filled 2021-02-08 (×3): qty 1

## 2021-02-08 NOTE — Progress Notes (Signed)
Physical Therapy Treatment Patient Details Name: Hunter [name] MRN: [id number] DOB: 23-Jun-1946 Today's Date: 02/08/2021    History of Present Illness Patient is a 75 year old male PMH of bladder cancer on gemcitabine and cisplatin, melanoma, CAD s/p CABG 2013, chronic hyponatremia, hypertension, hyperlipidemia, gout, atrial fibrillation, recurrent staph epi UTIs who was admitted 02/04/21 for cystoprostatectomy.    PT Comments    Progressing well with mobility.    Follow Up Recommendations  No PT follow up     Equipment Recommendations  3in1 (PT)    Recommendations for Other Services       Precautions / Restrictions Precautions Precaution Comments: L JP drain Restrictions Weight Bearing Restrictions: No    Mobility  Bed Mobility Overal bed mobility: Needs Assistance Bed Mobility: Sit to Supine       Sit to supine: Supervision        Transfers Overall transfer level: Needs assistance Equipment used: None Transfers: Sit to/from Stand Sit to Stand: Supervision            Ambulation/Gait Ambulation/Gait assistance: Min guard Gait Distance (Feet): 400 Feet Assistive device: None Gait Pattern/deviations: Step-through pattern;Decreased stride length         Stairs             Wheelchair Mobility    Modified Rankin (Stroke Patients Only)       Balance                                            Cognition Arousal/Alertness: Awake/alert Behavior During Therapy: WFL for tasks assessed/performed Overall Cognitive Status: Within Functional Limits for tasks assessed                                        Exercises      General Comments        Pertinent Vitals/Pain Pain Assessment: Faces Faces Pain Scale: Hurts little more Pain Location: lower abdomen incision site Pain Descriptors / Indicators: Discomfort;Sore;Grimacing Pain Intervention(s): Monitored during session    Home Living                       Prior Function            PT Goals (current goals can now be found in the care plan section) Progress towards PT goals: Progressing toward goals    Frequency    Min 3X/week      PT Plan Current plan remains appropriate    Co-evaluation              AM-PAC PT "6 Clicks" Mobility   Outcome Measure  Help needed turning from your back to your side while in a flat bed without using bedrails?: None Help needed moving from lying on your back to sitting on the side of a flat bed without using bedrails?: None Help needed moving to and from a bed to a chair (including a wheelchair)?: None Help needed standing up from a chair using your arms (e.g., wheelchair or bedside chair)?: A Little Help needed to walk in hospital room?: A Little Help needed climbing 3-5 steps with a railing? : A Little 6 Click Score: 21    End of Session   Activity Tolerance: Patient tolerated treatment well Patient left: in bed;with  call bell/phone within reach   PT Visit Diagnosis: Pain;Muscle weakness (generalized) (M62.81) Pain - part of body:  (abd)     Time: 8315-1761 PT Time Calculation (min) (ACUTE ONLY): 8 min  Charges:  $Gait Training: 8-22 mins              Doreatha Massed, PT Acute Rehabilitation  Office: [phone] Pager: [phone]

## 2021-02-08 NOTE — Progress Notes (Signed)
4 Days Post-Op Subjective: No acute events overnight.  Pain controlled.  States that his appetite is "fair".  He is tolerating a regular diet and denies nausea or vomiting.  He continues to have 2-3 loose bowel movements per day and passing flatus.  H&H stable.  Potassium and calcium levels slightly depleted  Objective: Vital signs in last 24 hours: Temp:  [98.1 F (36.7 C)-99.1 F (37.3 C)] 98.1 F (36.7 C) (02/13 0420) Pulse Rate:  [68-85] 68 (02/13 0420) Resp:  [18-20] 20 (02/13 0420) BP: (113-136)/(65-76) 113/65 (02/13 0420) SpO2:  [98 %-100 %] 100 % (02/13 0420)  Intake/Output from previous day: 02/12 0701 - 02/13 0700 In: 2553 [P.O.:840; I.V.:1713] Out: 1551 [Urine:1250; Drains:300; Stool:1]  Intake/Output this shift: Total I/O In: 240 [P.O.:240] Out: -   Physical Exam:  General: Alert and oriented CV: RRR, palpable distal pulses Lungs: CTAB, equal chest rise Abdomen: Soft, NTND, no rebound or guarding.  Urostomy is viable and draining clear urine Incisions: Clean, dry and intact Ext: NT, No erythema  Lab Results: Recent Labs    02/06/21 0450 02/07/21 0412 02/08/21 0430  HGB 8.4* 9.1* 8.8*  HCT 25.8* 27.5* 26.7*   BMET Recent Labs    02/07/21 0412 02/08/21 0430  NA 139 138  K 3.0* 2.8*  CL 109 108  CO2 23 23  GLUCOSE 119* 108*  BUN 14 13  CREATININE 1.31* 1.49*  CALCIUM 8.2* 7.9*     Studies/Results: No results found.  Assessment/Plan: POD4s/p cysto/ICG injection and robotic cystoprostatectomy with conduit diversion / node dissection.   -Continue regular diet.  I encouraged p.o. fluid intake -Out of bed to chair and ambulate -Heparin for DVT prophylaxis -Continue IV fluids.  Potassium and calcium supplements ordered -He continues to make great progress.  We will continue to monitor   LOS: 5 days   Ellison Hughs, MD [facility] Pager: [phone]  02/08/2021, 11:09 AM

## 2021-02-08 NOTE — Plan of Care (Signed)
  Problem: Education: Goal: Knowledge of General Education information will improve Description: Including pain rating scale, medication(s)/side effects and non-pharmacologic comfort measures Outcome: Progressing   Problem: Activity: Goal: Risk for activity intolerance will decrease Outcome: Progressing   Problem: Nutrition: Goal: Adequate nutrition will be maintained Outcome: Progressing   Problem: Coping: Goal: Level of anxiety will decrease Outcome: Progressing   Problem: Elimination: Goal: Will not experience complications related to bowel motility Outcome: Progressing Goal: Will not experience complications related to urinary retention Outcome: Progressing   Problem: Pain Managment: Goal: General experience of comfort will improve Outcome: Progressing   Problem: Safety: Goal: Ability to remain free from injury will improve Outcome: Progressing   Problem: Skin Integrity: Goal: Risk for impaired skin integrity will decrease Outcome: Progressing   Problem: Skin Integrity: Goal: Demonstration of wound healing without infection will improve Outcome: Progressing

## 2021-02-09 LAB — BASIC METABOLIC PANEL
Anion gap: 8 (ref 5–15)
BUN: 12 mg/dL (ref 8–23)
CO2: 22 mmol/L (ref 22–32)
Calcium: 8.1 mg/dL — ABNORMAL LOW (ref 8.9–10.3)
Chloride: 109 mmol/L (ref 98–111)
Creatinine, Ser: 1.23 mg/dL (ref 0.61–1.24)
GFR, Estimated: >60 mL/min (ref >60)
Glucose, Bld: 105 mg/dL — ABNORMAL HIGH (ref 70–99)
Potassium: 3 mmol/L — ABNORMAL LOW (ref 3.5–5.1)
Sodium: 139 mmol/L (ref 135–145)

## 2021-02-09 LAB — HEMOGLOBIN AND HEMATOCRIT, BLOOD
HCT: 27.7 % — ABNORMAL LOW (ref 39.0–52.0)
Hemoglobin: 9.1 g/dL — ABNORMAL LOW (ref 13.0–17.0)

## 2021-02-09 LAB — CREATININE, FLUID (PLEURAL, PERITONEAL, JP DRAINAGE): Creat, Fluid: 1.3 mg/dL

## 2021-02-09 LAB — SURGICAL PATHOLOGY

## 2021-02-09 NOTE — Progress Notes (Signed)
Physical Therapy Treatment and Discharge from Acute PT Patient Details Name: Hunter [name] MRN: [id number] DOB: December 21, 1946 Today's Date: 02/09/2021    History of Present Illness Patient is a 75 year old male PMH of bladder cancer on gemcitabine and cisplatin, melanoma, CAD s/p CABG 2013, chronic hyponatremia, hypertension, hyperlipidemia, gout, atrial fibrillation, recurrent staph epi UTIs who was admitted 02/04/21 for cystoprostatectomy.    PT Comments    Pt used bathroom to empty ileal conduit pouch prior to ambulating.  Pt ambulated good distance in hallway and presents at modified independent level.  Pt anticipates d/c home tomorrow.  Pt has met all PT goals and plans to continue ambulating ad lib during remainder of hospitalization.  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  Pt agreeable to no further PT needs.  Spouse also reports they no longer feel they need BSC at home.   Follow Up Recommendations  No PT follow up     Equipment Recommendations  None recommended by PT    Recommendations for Other Services       Precautions / Restrictions Precautions Precaution Comments: L JP drain, RLQ ileal conduit    Mobility  Bed Mobility Overal bed mobility: Modified Independent                  Transfers Overall transfer level: Modified independent                  Ambulation/Gait Ambulation/Gait assistance: Modified independent (Device/Increase time) Gait Distance (Feet): 800 Feet Assistive device: None Gait Pattern/deviations: WFL(Within Functional Limits)     General Gait Details: good pace, no unsteadiness or LOB   Stairs             Wheelchair Mobility    Modified Rankin (Stroke Patients Only)       Balance                                            Cognition Arousal/Alertness:  Awake/alert Behavior During Therapy: WFL for tasks assessed/performed Overall Cognitive Status: Within Functional Limits for tasks assessed                                        Exercises      General Comments        Pertinent Vitals/Pain Pain Assessment: No/denies pain    Home Living                      Prior Function            PT Goals (current goals can now be found in the care plan section) Progress towards PT goals: Goals met/education completed, patient discharged from PT    Frequency    Min 3X/week      PT Plan Current plan remains appropriate    Co-evaluation              AM-PAC PT "6 Clicks" Mobility   Outcome Measure  Help needed turning from your back to your side while in a flat bed without using bedrails?: None Help needed moving from lying on your back to sitting on  the side of a flat bed without using bedrails?: None Help needed moving to and from a bed to a chair (including a wheelchair)?: None Help needed standing up from a chair using your arms (e.g., wheelchair or bedside chair)?: None Help needed to walk in hospital room?: A Little Help needed climbing 3-5 steps with a railing? : A Little 6 Click Score: 22    End of Session   Activity Tolerance: Patient tolerated treatment well Patient left: in bed;with call bell/phone within reach;with family/visitor present   PT Visit Diagnosis: Muscle weakness (generalized) (M62.81)     Time: 2341-4436 PT Time Calculation (min) (ACUTE ONLY): 13 min  Charges:  $Gait Training: 8-22 mins                    Arlyce Dice, DPT Acute Rehabilitation Services Pager: [phone] Office: [phone]  York Ram E 02/09/2021, 4:10 PM

## 2021-02-09 NOTE — Progress Notes (Signed)
5 Days Post-Op   Subjective/Chief Complaint:  1 - Bladder Cancer - s/p robotic cystoprostatecotmy + ICG sentinal / template node dissection and ileal conduit diversion 02/04/21. Path pending. JP removed 2/14 as output scant and Cr same as serum. Admitted 2/8 for bowel prep and stomal marking.  2 - Ileus  - NPO initially post-op and received entereg given bowel anastamoses. Diet slowly advanced as bowel funciton resumed and tollerating regular diet with BM's by 2/13.  3 - Disposition / Rehab - independent in ADL's at baseline. Ostomy RN team working with in house for initial stomal care teaching.  Today "Hunter [name]" is stable. Hgb stabe 9s, pain contolled, ambulating, tollerating PO with resumed bowel function. Path still pending.   Objective: Vital signs in last 24 hours: Temp:  [98.4 F (36.9 C)-98.6 F (37 C)] 98.4 F (36.9 C) (02/14 1511) Pulse Rate:  [82-85] 82 (02/14 1511) Resp:  [18-20] 20 (02/14 1511) BP: (109-125)/(64-74) 109/64 (02/14 1511) SpO2:  [99 %-100 %] 100 % (02/14 1511) Last BM Date: 02/08/21  Intake/Output from previous day: 02/13 0701 - 02/14 0700 In: 240 [P.O.:240] Out: 1820 [Urine:1600; Drains:220] Intake/Output this shift: Total I/O In: -  Out: 150 [Drains:150]  General appearance: alert, cooperative and very pleasant, at baseline.  Eyes: negative Nose: Nares normal. Septum midline. Mucosa normal. No drainage or sinus tenderness. Throat: lips, mucosa, and tongue normal; teeth and gums normal Neck: supple, symmetrical, trachea midline Back: symmetric, no curvature. ROM normal. No CVA tenderness. Resp: Non-labored on room air.  Chest wall: no tenderness Cardio: Nl rate GI: soft, non-tender; bowel sounds normal; no masses,  no organomegaly and RLQ Urostomy pink / patent wtih Rt (red) and Lt (blue) bander stents in situ and copious non-foul urine and scant mucus.  Recetn port and extraction sites c/d/i. LLQ JP with scant non-foul serosanguinous output.  Male  genitalia: normal Extremities: extremities normal, atraumatic, no cyanosis or edema and stable LUE atrophy due to childhood polio.  Lymph nodes: Cervical, supraclavicular, and axillary nodes normal. Neurologic: Grossly normal  Lab Results:  Recent Labs    02/08/21 0430 02/09/21 0330  HGB 8.8* 9.1*  HCT 26.7* 27.7*   BMET Recent Labs    02/08/21 0430 02/09/21 0330  NA 138 139  K 2.8* 3.0*  CL 108 109  CO2 23 22  GLUCOSE 108* 105*  BUN 13 12  CREATININE 1.49* 1.23  CALCIUM 7.9* 8.1*   PT/INR No results for input(s): LABPROT, INR in the last 72 hours. ABG No results for input(s): PHART, HCO3 in the last 72 hours.  Invalid input(s): PCO2, PO2  Studies/Results: No results found.  Anti-infectives: Anti-infectives (From admission, onward)   Start     Dose/Rate Route Frequency Ordered Stop   02/04/21 0800  piperacillin-tazobactam (ZOSYN) IVPB 2.25 g        2.25 g 100 mL/hr over 30 Minutes Intravenous On call to O.R. 02/03/21 1804 02/04/21 0830   02/03/21 1915  neomycin (MYCIFRADIN) tablet 500 mg        500 mg Oral Every 4 hours 02/03/21 1804 02/03/21 2210   02/03/21 1915  metroNIDAZOLE (FLAGYL) tablet 500 mg        500 mg Oral Every 4 hours 02/03/21 1804 02/03/21 2210   02/03/21 1657  piperacillin-tazobactam (ZOSYN) IVPB 3.375 g  Status:  Discontinued       Note to Pharmacy: 2.25g per Dr. Tresa Moore   3.375 g 100 mL/hr over 30 Minutes Intravenous 30 min pre-op 02/03/21 1657 02/03/21 1705  Assessment/Plan:  Doing very well POD 5. SLIV, ambulate, CM consult to help arrange home health with goal so DC tomorrow. Check JP Cr and remove as saem ase serum.   Alexis Frock 02/09/2021

## 2021-02-09 NOTE — Plan of Care (Signed)
  Problem: Education: Goal: Knowledge of General Education information will improve Description: Including pain rating scale, medication(s)/side effects and non-pharmacologic comfort measures Outcome: Progressing   Problem: Health Behavior/Discharge Planning: Goal: Ability to manage health-related needs will improve Outcome: Progressing   Problem: Clinical Measurements: Goal: Ability to maintain clinical measurements within normal limits will improve Outcome: Progressing Goal: Diagnostic test results will improve Outcome: Progressing Goal: Respiratory complications will improve Outcome: Progressing Goal: Cardiovascular complication will be avoided Outcome: Progressing   Problem: Activity: Goal: Risk for activity intolerance will decrease Outcome: Progressing   Problem: Nutrition: Goal: Adequate nutrition will be maintained Outcome: Progressing   Problem: Coping: Goal: Level of anxiety will decrease Outcome: Progressing   Problem: Elimination: Goal: Will not experience complications related to bowel motility Outcome: Progressing Goal: Will not experience complications related to urinary retention Outcome: Progressing   Problem: Pain Managment: Goal: General experience of comfort will improve Outcome: Progressing   

## 2021-02-09 NOTE — Consult Note (Signed)
[place] Nurse ostomy follow up Wife performed pouch change with minimal assistance.  Stoma type/location: RLQ ileal conduit Stomal assessment/size: 1" pink patent and producing clear yellow urine.  Blue and red stents in place.  Peristomal assessment: intact  Less creasing today.  Treatment options for stomal/peristomal skin: barrier ring and convex pouch Output clear yellow urine Ostomy pouching: 1pc.convex with barrier ring Education provided: Wife performed pouch change today. Was able to remove pouch , cut barrier opening and apply barrier ring.  She applied pouch and demonstrated opening and closing and connecting to bedside drainage.  WE have left him disconnected from bedside drainage so urine can accumulate in pouch and patient can walk to bathroom and empty his own pouch.  They are anticipating discharge tomorrow. Given 3 pouch sets and have ordered 2 more.   Enrolled patient in [place] Discharge program: Yes today.  Will not follow at this time.  Please re-consult if needed.  Domenic Moras MSN, RN, FNP-BC [facility] Wound, Ostomy, Continence Nurse Pager [phone]

## 2021-02-09 NOTE — Care Management Important Message (Signed)
Important Message  Patient Details IM Letter given to the Patient. Name: Hunter [name] MRN: [id number] Date of Birth: [birth year]   Medicare Important Message Given:  Yes     Kerin Salen 02/09/2021, 12:24 PM

## 2021-02-10 ENCOUNTER — Other Ambulatory Visit (HOSPITAL_COMMUNITY): Payer: Self-pay | Admitting: Urology

## 2021-02-10 MED ORDER — TRAMADOL HCL 50 MG PO TABS: 50.0000 mg | ORAL_TABLET | Freq: Four times a day (QID) | ORAL | 0 refills | Status: DC | PRN

## 2021-02-10 MED ORDER — HEPARIN SOD (PORK) LOCK FLUSH 100 UNIT/ML IV SOLN
500.0000 [IU] | INTRAVENOUS | Status: AC | PRN
  Administered 2021-02-10: 500 [IU]

## 2021-02-10 MED FILL — traMADol HCL 50 MG TABS: 50 | 3 days supply | Qty: 15 | Fill #0

## 2021-02-10 NOTE — Plan of Care (Signed)
Problem: Education: Goal: Knowledge of General Education information will improve Description: Including pain rating scale, medication(s)/side effects and non-pharmacologic comfort measures Outcome: Completed/Met   Problem: Health Behavior/Discharge Planning: Goal: Ability to manage health-related needs will improve Outcome: Completed/Met   Problem: Clinical Measurements: Goal: Ability to maintain clinical measurements within normal limits will improve Outcome: Completed/Met Goal: Diagnostic test results will improve Outcome: Completed/Met Goal: Respiratory complications will improve Outcome: Completed/Met Goal: Cardiovascular complication will be avoided Outcome: Completed/Met   Problem: Activity: Goal: Risk for activity intolerance will decrease Outcome: Completed/Met   Problem: Nutrition: Goal: Adequate nutrition will be maintained Outcome: Completed/Met   Problem: Coping: Goal: Level of anxiety will decrease Outcome: Completed/Met   Problem: Elimination: Goal: Will not experience complications related to bowel motility Outcome: Completed/Met Goal: Will not experience complications related to urinary retention Outcome: Completed/Met   Problem: Pain Managment: Goal: General experience of comfort will improve Outcome: Completed/Met   Problem: Safety: Goal: Ability to remain free from injury will improve Outcome: Completed/Met   Problem: Skin Integrity: Goal: Risk for impaired skin integrity will decrease Outcome: Completed/Met   Problem: Education: Goal: Required Educational Video(s) Outcome: Completed/Met   Problem: Clinical Measurements: Goal: Ability to maintain clinical measurements within normal limits will improve Outcome: Completed/Met Goal: Postoperative complications will be avoided or minimized Outcome: Completed/Met   Problem: Skin Integrity: Goal: Demonstration of wound healing without infection will improve Outcome: Completed/Met    Problem: Education: Goal: Knowledge of ostomy care will improve Outcome: Completed/Met Goal: Understanding of discharge needs will improve Outcome: Completed/Met   Problem: Coping: Goal: Coping ability will improve Outcome: Completed/Met   Problem: Skin Integrity: Goal: Risk for impaired skin integrity will decrease Outcome: Completed/Met

## 2021-02-10 NOTE — TOC Transition Note (Signed)
Transition of Care [facility]) - CM/SW Discharge Note   Patient Details  Name: Hunter [name] MRN: [id number] Date of Birth: 01/30/1946  Transition of [facility]) CM/SW Contact:  Ross Ludwig, LCSW Phone Number: 02/10/2021, 11:42 AM   Clinical Narrative:     CSW was informed that patient will need a home health RN.  CSW spoke to [facility] and they can accept patient.  CSW contacted patient's wife and updated her that [facility] will be able to see patient.  CSW signing off, no other needs.  Patient discharging back home with his wife.   Final next level of care: [place] Barriers to Discharge: Barriers Resolved   Patient Goals and CMS Choice Patient states their goals for this hospitalization and ongoing recovery are:: To return back home with his wife. CMS Medicare.gov Compare Post Acute Care list provided to:: Patient Represenative (must comment) Choice offered to / list presented to : Spouse  Discharge Placement  Home with home health RN.                     Discharge Plan and Services  Home health Rn.                        HH Arranged: RN [facility] Agency: [place] Date [facility] Agency Contacted: 02/10/21 Time HH Agency Contacted: 1000 Representative spoke with at [place]: [place] ([place]) Interventions     Readmission Risk Interventions No flowsheet data found.

## 2021-02-10 NOTE — Discharge Instructions (Signed)

## 2021-02-10 NOTE — Plan of Care (Signed)
  Problem: Education: Goal: Knowledge of General Education information will improve Description: Including pain rating scale, medication(s)/side effects and non-pharmacologic comfort measures Outcome: Completed/Met   Problem: Health Behavior/Discharge Planning: Goal: Ability to manage health-related needs will improve Outcome: Completed/Met   Problem: Clinical Measurements: Goal: Ability to maintain clinical measurements within normal limits will improve Outcome: Completed/Met Goal: Diagnostic test results will improve Outcome: Completed/Met Goal: Respiratory complications will improve Outcome: Completed/Met Goal: Cardiovascular complication will be avoided Outcome: Completed/Met   Problem: Activity: Goal: Risk for activity intolerance will decrease Outcome: Completed/Met   Problem: Nutrition: Goal: Adequate nutrition will be maintained Outcome: Completed/Met   Problem: Coping: Goal: Level of anxiety will decrease Outcome: Completed/Met   Problem: Elimination: Goal: Will not experience complications related to bowel motility Outcome: Completed/Met Goal: Will not experience complications related to urinary retention Outcome: Completed/Met   Problem: Pain Managment: Goal: General experience of comfort will improve Outcome: Completed/Met   Problem: Safety: Goal: Ability to remain free from injury will improve Outcome: Completed/Met   Problem: Skin Integrity: Goal: Risk for impaired skin integrity will decrease Outcome: Completed/Met   Problem: Education: Goal: Required Educational Video(s) Outcome: Completed/Met   Problem: Clinical Measurements: Goal: Ability to maintain clinical measurements within normal limits will improve Outcome: Completed/Met Goal: Postoperative complications will be avoided or minimized Outcome: Completed/Met   Problem: Skin Integrity: Goal: Demonstration of wound healing without infection will improve Outcome: Completed/Met

## 2021-02-10 NOTE — Discharge Summary (Signed)
Physician Discharge Summary  Patient ID: Hunter [name] MRN: [id number] DOB/AGE: 1946/09/30 75 y.o.  Admit date: 02/03/2021 Discharge date: 02/10/2021  Admission Diagnoses: Bladder Cancer  Discharge Diagnoses:  Active Problems:   Bladder cancer [facility]) and Prostate Cancer  Discharged Condition: good  Hospital Course:   1 - Bladder + Cancer - s/p robotic cystoprostatecotmy + ICG sentinal / template node dissection and ileal conduit diversion 02/04/21. Path [id number] bladder and some incidetnal moderate risk prostate cancer, negative margins. JP removed 2/14 as output scant and Cr same as serum. Admitted 2/8 for bowel prep and stomal marking.  2 - Ileus  - NPO initially post-op and received entereg given bowel anastamoses. Diet slowly advanced as bowel funciton resumed and tollerating regular diet with BM's by 2/13.  3 - Disposition / Rehab - independent in ADL's at baseline. Ostomy RN team working with in house for initial stomal care teaching. HHRN for discharge.   By the AM of 02/10/21 he is ambulatory, pain controlled on non-narcotic meds, maintaining PO nutrition with resumed bowel function, and felt to be adequate for discharge.   Consults: case management, PT  Significant Diagnostic Studies: labs: as per above  Treatments: surgery: as per above  Discharge Exam: Blood pressure 121/65, pulse 82, temperature 98 F (36.7 C), temperature source Oral, resp. rate 20, height 5\' 9"  (1.753 m), weight 80.5 kg, SpO2 96 %.  General appearance: alert, cooperative and very pleasant, at baseline.  Eyes: negative Nose: Nares normal. Septum midline. Mucosa normal. No drainage or sinus tenderness. Throat: lips, mucosa, and tongue normal; teeth and gums normal Neck: supple, symmetrical, trachea midline Back: symmetric, no curvature. ROM normal. No CVA tenderness. Resp: Non-labored on room air.  Chest wall: no tenderness Cardio: Nl rate GI: soft, non-tender; bowel sounds normal; no masses,   no organomegaly and RLQ Urostomy pink / patent wtih Rt (red) and Lt (blue) bander stents in situ and copious non-foul urine and scant mucus.  Recetn port and extraction sites c/d/i. LLQ JP site dry with dry dressing, drain now out. 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  Lymph nodes: Cervical, supraclavicular, and axillary nodes normal. Neurologic: Grossly normal   Disposition:  HOME   Allergies as of 02/10/2021      Reactions   Amiodarone    Hallucinations   Oxycodone Other (See Comments)   Bad dreams      Medication List    STOP taking these medications   megestrol 400 MG/10ML suspension Commonly known as: MEGACE   tamsulosin 0.4 MG Caps capsule Commonly known as: FLOMAX     TAKE these medications   acetaminophen 325 MG tablet Commonly known as: TYLENOL Take 650 mg by mouth every 6 (six) hours as needed for mild pain, fever or headache.   allopurinol 300 MG tablet Commonly known as: ZYLOPRIM Take 300 mg by mouth daily.   metoprolol tartrate 50 MG tablet Commonly known as: LOPRESSOR Take 1 tablet (50 mg total) by mouth 2 (two) times daily. Monitor BP before taking 2nd dose SBP>100 What changed: additional instructions   omeprazole 20 MG capsule Commonly known as: PRILOSEC Take 20 mg by mouth every Monday, Wednesday, and Friday.   polyethylene glycol 17 g packet Commonly known as: MIRALAX / GLYCOLAX Take 17 g by mouth daily as needed for mild constipation.   prochlorperazine 10 MG tablet Commonly known as: COMPAZINE Take 1 tablet (10 mg total) by mouth every 6 (six) hours as needed  for nausea or vomiting.   simvastatin 40 MG tablet Commonly known as: ZOCOR Take 1 tablet (40 mg total) by mouth at bedtime.   traMADol 50 MG tablet Commonly known as: Ultram Take 1 tablet (50 mg total) by mouth every 6 (six) hours as needed for moderate pain or severe pain (post-operatively).        Follow-up Information    Alexis Frock, MD On 03/03/2021.   Specialty: Urology Why: at 2:15 for post-op appointment Contact information: [address] [id number] [phone]               Signed: Alexis Frock 02/10/2021, 7:12 AM

## 2021-02-17 ENCOUNTER — Telehealth: Payer: Self-pay | Admitting: Oncology

## 2021-02-17 NOTE — Telephone Encounter (Signed)
Called patient regarding upcoming March appointments, left a voicemail. ?

## 2021-03-13 ENCOUNTER — Inpatient Hospital Stay (HOSPITAL_BASED_OUTPATIENT_CLINIC_OR_DEPARTMENT_OTHER): Payer: Medicare Other | Admitting: Oncology

## 2021-03-13 ENCOUNTER — Other Ambulatory Visit: Payer: Self-pay

## 2021-03-13 ENCOUNTER — Inpatient Hospital Stay: Payer: Medicare Other | Attending: Oncology

## 2021-03-13 ENCOUNTER — Inpatient Hospital Stay: Payer: Medicare Other

## 2021-03-13 VITALS — BP 121/65 | HR 66 | Temp 97.7°F | Resp 17 | Ht 69.0 in | Wt 174.5 lb

## 2021-03-13 DIAGNOSIS — C61 Malignant neoplasm of prostate: Secondary | ICD-10-CM | POA: Insufficient documentation

## 2021-03-13 DIAGNOSIS — C679 Malignant neoplasm of bladder, unspecified: Secondary | ICD-10-CM

## 2021-03-13 DIAGNOSIS — Z95828 Presence of other vascular implants and grafts: Secondary | ICD-10-CM

## 2021-03-13 DIAGNOSIS — D630 Anemia in neoplastic disease: Secondary | ICD-10-CM | POA: Insufficient documentation

## 2021-03-13 DIAGNOSIS — N189 Chronic kidney disease, unspecified: Secondary | ICD-10-CM | POA: Insufficient documentation

## 2021-03-13 LAB — SAMPLE TO BLOOD BANK

## 2021-03-13 LAB — CMP (CANCER CENTER ONLY)
ALT: 12 U/L (ref 0–44)
AST: 16 U/L (ref 15–41)
Albumin: 3 g/dL — ABNORMAL LOW (ref 3.5–5.0)
Alkaline Phosphatase: 88 U/L (ref 38–126)
Anion gap: 7 (ref 5–15)
BUN: 13 mg/dL (ref 8–23)
CO2: 24 mmol/L (ref 22–32)
Calcium: 8.7 mg/dL — ABNORMAL LOW (ref 8.9–10.3)
Chloride: 110 mmol/L (ref 98–111)
Creatinine: 1.45 mg/dL — ABNORMAL HIGH (ref 0.61–1.24)
GFR, Estimated: 51 mL/min — ABNORMAL LOW (ref >60)
Glucose, Bld: 111 mg/dL — ABNORMAL HIGH (ref 70–99)
Potassium: 3.4 mmol/L — ABNORMAL LOW (ref 3.5–5.1)
Sodium: 141 mmol/L (ref 135–145)
Total Bilirubin: 0.3 mg/dL (ref 0.3–1.2)
Total Protein: 6.2 g/dL — ABNORMAL LOW (ref 6.5–8.1)

## 2021-03-13 LAB — CBC WITH DIFFERENTIAL (CANCER CENTER ONLY)
Abs Immature Granulocytes: 0.02 10*3/uL (ref 0.00–0.07)
Basophils Absolute: 0 10*3/uL (ref 0.0–0.1)
Basophils Relative: 0 %
Eosinophils Absolute: 0.5 10*3/uL (ref 0.0–0.5)
Eosinophils Relative: 8 %
HCT: 30.1 % — ABNORMAL LOW (ref 39.0–52.0)
Hemoglobin: 10 g/dL — ABNORMAL LOW (ref 13.0–17.0)
Immature Granulocytes: 0 %
Lymphocytes Relative: 17 %
Lymphs Abs: 1.2 10*3/uL (ref 0.7–4.0)
MCH: 31 pg (ref 26.0–34.0)
MCHC: 33.2 g/dL (ref 30.0–36.0)
MCV: 93.2 fL (ref 80.0–100.0)
Monocytes Absolute: 0.5 10*3/uL (ref 0.1–1.0)
Monocytes Relative: 7 %
Neutro Abs: 4.7 10*3/uL (ref 1.7–7.7)
Neutrophils Relative %: 68 %
Platelet Count: 183 10*3/uL (ref 150–400)
RBC: 3.23 MIL/uL — ABNORMAL LOW (ref 4.22–5.81)
RDW: 13.5 % (ref 11.5–15.5)
WBC Count: 6.9 10*3/uL (ref 4.0–10.5)
nRBC: 0 % (ref 0.0–0.2)

## 2021-03-13 MED ORDER — HEPARIN SOD (PORK) LOCK FLUSH 100 UNIT/ML IV SOLN
500.0000 [IU] | Freq: Once | INTRAVENOUS | Status: AC
  Administered 2021-03-13: 500 [IU] via INTRAVENOUS
  Filled 2021-03-13: qty 5

## 2021-03-13 MED ORDER — SODIUM CHLORIDE 0.9% FLUSH
10.0000 mL | INTRAVENOUS | Status: DC | PRN
  Administered 2021-03-13: 10 mL via INTRAVENOUS
  Filled 2021-03-13: qty 10

## 2021-03-13 NOTE — Patient Instructions (Signed)
Implanted Port Insertion, Care After This sheet gives you information about how to care for yourself after your procedure. Your health care provider may also give you more specific instructions. If you have problems or questions, contact your health care provider. What can I expect after the procedure? After the procedure, it is common to have:  Discomfort at the port insertion site.  Bruising on the skin over the port. This should improve over 3-4 days. Follow these instructions at home: Port care  After your port is placed, you will get a manufacturer's information card. The card has information about your port. Keep this card with you at all times.  Take care of the port as told by your health care provider. Ask your health care provider if you or a family member can get training for taking care of the port at home. A home health care nurse may also take care of the port.  Make sure to remember what type of port you have. Incision care  Follow instructions from your health care provider about how to take care of your port insertion site. Make sure you: ? Wash your hands with soap and water before and after you change your bandage (dressing). If soap and water are not available, use hand sanitizer. ? Change your dressing as told by your health care provider. ? Leave stitches (sutures), skin glue, or adhesive strips in place. These skin closures may need to stay in place for 2 weeks or longer. If adhesive strip edges start to loosen and curl up, you may trim the loose edges. Do not remove adhesive strips completely unless your health care provider tells you to do that.  Check your port insertion site every day for signs of infection. Check for: ? Redness, swelling, or pain. ? Fluid or blood. ? Warmth. ? Pus or a bad smell.      Activity  Return to your normal activities as told by your health care provider. Ask your health care provider what activities are safe for you.  Do not  lift anything that is heavier than 10 lb (4.5 kg), or the limit that you are told, until your health care provider says that it is safe. General instructions  Take over-the-counter and prescription medicines only as told by your health care provider.  Do not take baths, swim, or use a hot tub until your health care provider approves. Ask your health care provider if you may take showers. You may only be allowed to take sponge baths.  Do not drive for 24 hours if you were given a sedative during your procedure.  Wear a medical alert bracelet in case of an emergency. This will tell any health care providers that you have a port.  Keep all follow-up visits as told by your health care provider. This is important. Contact a health care provider if:  You cannot flush your port with saline as directed, or you cannot draw blood from the port.  You have a fever or chills.  You have redness, swelling, or pain around your port insertion site.  You have fluid or blood coming from your port insertion site.  Your port insertion site feels warm to the touch.  You have pus or a bad smell coming from the port insertion site. Get help right away if:  You have chest pain or shortness of breath.  You have bleeding from your port that you cannot control. Summary  Take care of the port as told by your   health care provider. Keep the manufacturer's information card with you at all times.  Change your dressing as told by your health care provider.  Contact a health care provider if you have a fever or chills or if you have redness, swelling, or pain around your port insertion site.  Keep all follow-up visits as told by your health care provider. This information is not intended to replace advice given to you by your health care provider. Make sure you discuss any questions you have with your health care provider. Document Revised: 07/11/2018 Document Reviewed: 07/11/2018 Elsevier Patient Education   2021 [employer]  

## 2021-03-13 NOTE — Progress Notes (Signed)
Hematology and Oncology Follow Up Visit  Hunter [name] [id number] 27-Feb-1946 75 y.o. 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     Prior Therapy:  He is status post TURBT completed in August 2021 which confirmed the presence of muscle invasion.  Neoadjuvant chemotherapy utilizing cisplatin and gemcitabine started on September 17, 2020.  He is status post 3 cycles of therapy completed in November 2021.  He is status post robotic assisted laparoscopic cystoprostatectomy and ileal conduit formation and lymphadenectomy.  The final pathology showed residual focus of urothelial carcinoma in situ without any lymph node involvement.  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  Current therapy: Active surveillance.     Interim History: Hunter [name] returns today for a follow-up visit.  Since the last visit, he underwent laparoscopic assisted prostatectomy under the care of Dr. Tresa Moore with the pathology showed very little to no residual disease.  Since his surgery, he reports a feeling well and has recovered without any major complications.  He denies any nausea, vomiting or abdominal pain.  He denies any constitutional symptoms or any hospitalizations.  He denies any complications related to his Port-A-Cath or urostomy bag.      Medications: Updated on review. Current Outpatient Medications  Medication Sig Dispense Refill  . acetaminophen (TYLENOL) 325 MG tablet Take 650 mg by mouth every 6 (six) hours as needed for mild pain, fever or headache.    . allopurinol (ZYLOPRIM) 300 MG tablet Take 300 mg by mouth daily.    . metoprolol tartrate (LOPRESSOR) 25 MG tablet Take 25 mg by mouth daily. Monitor BP before taking second dose SBP > 100    . metoprolol tartrate (LOPRESSOR) 25 MG tablet Take 50 mg by mouth at bedtime. Monitor BP before taking  second dose SBP > 100    . omeprazole (PRILOSEC) 20 MG capsule Take 20 mg by mouth every Monday, Wednesday, and Friday.     . polyethylene glycol (MIRALAX / GLYCOLAX) 17 g packet Take 17 g by mouth daily as needed for mild constipation. (Patient not taking: Reported on 01/21/2021) 14 each 1  . prochlorperazine (COMPAZINE) 10 MG tablet Take 1 tablet (10 mg total) by mouth every 6 (six) hours as needed for nausea or vomiting. 30 tablet 0  . simvastatin (ZOCOR) 40 MG tablet Take 1 tablet (40 mg total) by mouth at bedtime. 90 tablet 3  . traMADol (ULTRAM) 50 MG tablet Take 1 tablet (50 mg total) by mouth every 6 (six) hours as needed for moderate pain or severe pain (post-operatively). 15 tablet 0   No current facility-administered medications for this visit.   Facility-Administered Medications Ordered in Other Visits  Medication Dose Route Frequency Provider Last Rate Last Admin  . gemcitabine (GEMZAR) chemo syringe for bladder instillation 2,000 mg  2,000 mg Bladder Instillation Once Robley Fries, MD         Allergies:  Allergies  Allergen Reactions  . Amiodarone     Hallucinations   . Oxycodone Other (See Comments)    Bad dreams      Physical Exam:    Blood pressure 121/65, pulse 66, temperature 97.7 F (36.5 C), temperature source Tympanic, resp. rate 17, height 5\' 9"  (1.753 m), weight 174 lb 8 oz (79.2 kg), SpO2 100 %.     ECOG: 1     General appearance: Alert, awake without  any distress. Head: Atraumatic without abnormalities Oropharynx: Without any thrush or ulcers. Eyes: No scleral icterus. Lymph nodes: No lymphadenopathy noted in the cervical, supraclavicular, or axillary nodes Heart:regular rate and rhythm, without any murmurs or gallops.   Lung: Clear to auscultation without any rhonchi, wheezes or dullness to percussion. Abdomin: Soft, nontender without any shifting dullness or ascites. Musculoskeletal: No clubbing or cyanosis. Neurological: No motor or  sensory deficits. Skin: No rashes or lesions.          Lab Results: Lab Results  Component Value Date   WBC 8.2 02/03/2021   HGB 9.1 (L) 02/09/2021   HCT 27.7 (L) 02/09/2021   MCV 98.0 02/03/2021   PLT 181 02/03/2021     Chemistry      Component Value Date/Time   NA 139 02/09/2021 0330   NA 142 08/24/2019 1415   K 3.0 (L) 02/09/2021 0330   CL 109 02/09/2021 0330   CO2 22 02/09/2021 0330   BUN 12 02/09/2021 0330   BUN 36 (H) 08/24/2019 1415   CREATININE 1.23 02/09/2021 0330   CREATININE 1.89 (H) 12/03/2020 0935   CREATININE 1.25 09/26/2014 0921      Component Value Date/Time   CALCIUM 8.1 (L) 02/09/2021 0330   CALCIUM 8.6 12/03/2020 0935   ALKPHOS 103 02/03/2021 1730   AST 15 02/03/2021 1730   AST 15 12/03/2020 0935   ALT 12 02/03/2021 1730   ALT 18 12/03/2020 0935   BILITOT 0.6 02/03/2021 1730   BILITOT 0.5 12/03/2020 0935        Impression and Plan:  75 year old man with:  1.    Bladder cancer diagnosed in August 2021.  He was found to have T2N0 high-grade urothelial carcinoma.   His disease status was updated at this time and treatment options were reviewed.  He had an excellent response to neoadjuvant chemotherapy with very little residual disease after radical cystectomy and lymphadenectomy.  At this point, his risk of relapse remains low I recommended active surveillance without any additional therapy.  Salvage therapy with immune agents would be recommended if he developed relapsed disease in the future.  He is agreeable to proceed.  2. IV access: Port-A-Cath removal was discussed at this time.  He opted to keep it for the time being.  We will continue to flush every 2 months.  3. Chronic renal insufficiency: Kidney function is close to normal range after his surgery.  4. Goals of care:His disease is incurable and aggressive measures are warranted.  5.  Anemia: Related to his malignancy and treatment as well as surgery.  Hemoglobin was  9.1 on February 14.  Laboratory data from today showed improvement in hemoglobin without any need for transfusion.   6. Follow-up: In 6 months for repeat follow-up.  30  minutes were spent on this visit.  The time was dedicated to reviewing laboratory data, disease status update and future treatment options.   Zola Button, MD 3/18/20229:27 AM

## 2021-05-01 ENCOUNTER — Telehealth: Payer: Self-pay | Admitting: Oncology

## 2021-05-01 NOTE — Telephone Encounter (Signed)
R/s appt per 5/6 sch msg. Pt aware.  

## 2021-05-08 ENCOUNTER — Inpatient Hospital Stay: Payer: Medicare Other | Attending: Oncology

## 2021-05-08 ENCOUNTER — Other Ambulatory Visit: Payer: Self-pay

## 2021-05-08 DIAGNOSIS — C679 Malignant neoplasm of bladder, unspecified: Secondary | ICD-10-CM | POA: Diagnosis present

## 2021-05-08 DIAGNOSIS — Z452 Encounter for adjustment and management of vascular access device: Secondary | ICD-10-CM | POA: Insufficient documentation

## 2021-06-03 ENCOUNTER — Ambulatory Visit (INDEPENDENT_AMBULATORY_CARE_PROVIDER_SITE_OTHER): Payer: Medicare Other | Admitting: Cardiovascular Disease

## 2021-06-03 ENCOUNTER — Encounter: Payer: Self-pay | Admitting: Cardiovascular Disease

## 2021-06-03 ENCOUNTER — Other Ambulatory Visit: Payer: Self-pay

## 2021-06-03 VITALS — BP 120/74 | HR 60 | Ht 69.0 in | Wt 173.4 lb

## 2021-06-03 DIAGNOSIS — E7800 Pure hypercholesterolemia, unspecified: Secondary | ICD-10-CM | POA: Diagnosis not present

## 2021-06-03 DIAGNOSIS — R7303 Prediabetes: Secondary | ICD-10-CM

## 2021-06-03 DIAGNOSIS — I25810 Atherosclerosis of coronary artery bypass graft(s) without angina pectoris: Secondary | ICD-10-CM | POA: Diagnosis not present

## 2021-06-03 DIAGNOSIS — I10 Essential (primary) hypertension: Secondary | ICD-10-CM | POA: Diagnosis not present

## 2021-06-03 DIAGNOSIS — I480 Paroxysmal atrial fibrillation: Secondary | ICD-10-CM | POA: Diagnosis not present

## 2021-06-03 DIAGNOSIS — N1831 Chronic kidney disease, stage 3a: Secondary | ICD-10-CM

## 2021-06-03 NOTE — Patient Instructions (Signed)

## 2021-06-03 NOTE — Progress Notes (Signed)
Cardiology Office Note    Date:  06/03/2021   ID:  Hunter [name], DOB 03-08-1946, MRN [id number]  PCP:  [name] E  Cardiologist:   Sanda Klein, MD   Chief Complaint  Patient presents with  . Coronary Artery Disease  . Atrial Fibrillation    History of Present Illness:  Hunter [name] is a 75 y.o. male with coronary artery disease who underwent bypass surgery in 6314, complicated by transitory postoperative atrial fibrillation, with subsequent recurrence of atrial fibrillation during critical illness and electrolyte imbalances in November 2021. He has hypertension and hyperlipidemia.   He had severe hematuria in November 2021 and was receiving chemotherapy for bladder cancer.  He subsequently underwent urinary cystectomy with placement of a right lower quadrant urostomy in February 2022.  He has recovered very well from that surgery.  He is lost a lot of weight gradually over the last few years, even before having his urological problems.  He is now very close to ideal body mass index at 25.  He walks on a regular basis.  He watches his diet carefully.  The patient specifically denies any chest pain at rest exertion, dyspnea at rest or with exertion, orthopnea, paroxysmal nocturnal dyspnea, syncope, palpitations, focal neurological deficits, intermittent claudication, lower extremity edema, unexplained weight gain, cough, hemoptysis or wheezing.  He is having some problems with pain in his right knee.  He has not been on anticoagulation due to problems with severe hematuria due to his bladder cancer.  He has not had any recurrent palpitations since his medical condition has improved.  He was aware of the atrial fibrillation when it occurred.  He was intolerant of amiodarone.  He has completed chemotherapy.  He has a Port-A-Cath in place.  His urologist is Dr. Tresa Moore and his oncologist is Dr. Alen Blew.  His last formal functional study was a normal treadmill ECG stress test in  2016.  LVEF was low normal at 50% by echo in November 2021 (during atrial fibrillation).  Past Medical History:  Diagnosis Date  . Atrial fibrillation, rapid ([place])    With high ventricular rates. Intol amio, was on Sotalol, now on BB  . Bladder cancer ([place]) dx'd 10/2019  . Bursitis of right shoulder   . CAD (coronary artery disease) 11/2012   CABG x 4 using left internal mammary artery and right endovein harvest. (LIMA-LAD; VG-OM1, OM2, VG-PD), EF 55-60% at cath  . Degenerative disc disease, lumbar   . Epigastric pain    Myoview 11/09/12 Showed evidence of reversible ischemia in both the inferior wall & the anteroapical distribution.   . Erectile dysfunction    H/O ED while taking a beta-blocker  . GERD (gastroesophageal reflux disease)   . Gout   . Hiatal hernia   . Hyperlipidemia   . Hypertension    Systemic HTN. Pt has H/O of ED when taking a beta-blocker. ECHO 11/26/08 Showed no evidence of any significant valvular disease, Estimated EF = 50-60%.  . Kidney stone    " i have  6-7 stones imbedded in my left kidney "   . Mononeuritis of unspecified site   . Nocturia   . NSVT (nonsustained ventricular tachycardia) ([place])   . Obesity   . 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 PAF (paroxysmal atrial fibrillation) (H[facility])    Post CABG. Amia intol. Was on Sotalol, now on Lopressor  . 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 Seasonal allergies   . Skin cancer (melanoma) ([place]) 08/04/2015   left side of face   . Skin lesion of face    hx of    Past Surgical History:  Procedure Laterality Date  . CARDIAC CATHETERIZATION     12/08/2012  . COLONOSCOPY    . CORONARY ARTERY BYPASS GRAFT  12/12/2012   CABG x 4 using left internal mammary artery and right endovein harvest. (LIMA-LAD; VG-OM1, OM2, VG-PD)  . CYSTOSCOPY WITH INJECTION N/A 02/04/2021   Procedure:  CYSTOSCOPY WITH INJECTION OF INDOCYANINE GREEN DYE;  Surgeon: Alexis Frock, MD;  Location: [facility];  Service: Urology;  Laterality: N/A;  . EYE SURGERY     had lasik surgery bilaterally 1996  . IR IMAGING GUIDED PORT INSERTION  09/10/2020  . LEFT HEART CATHETERIZATION WITH CORONARY ANGIOGRAM N/A 12/08/2012   Procedure: LEFT HEART CATHETERIZATION WITH CORONARY ANGIOGRAM;  Surgeon: Sanda Klein, MD;  Location: [facility];  Service: Cardiovascular;  Laterality: N/A;  . LYMPHADENECTOMY Bilateral 02/04/2021   Procedure: LYMPHADENECTOMY;  Surgeon: Alexis Frock, MD;  Location: [facility];  Service: Urology;  Laterality: Bilateral;  . ROBOT ASSISTED LAPAROSCOPIC COMPLETE CYSTECT ILEAL CONDUIT N/A 02/04/2021   Procedure: XI ROBOTIC ASSISTED LAPAROSCOPIC COMPLETE CYSTOPROSTATECTOMY ILEAL CONDUIT;  Surgeon: Alexis Frock, MD;  Location: [facility];  Service: Urology;  Laterality: N/A;  6 HRS  . SHOULDER SURGERY     for effects of polio  . SKIN CANCER EXCISION  2016   left side of face   . TRANSURETHRAL RESECTION OF BLADDER TUMOR N/A 11/16/2019   Procedure: TRANSURETHRAL RESECTION OF BLADDER TUMOR (TURBT) WITH INSTILLATION OF POST OPERATIVE CHEMOTHERAPY;  Surgeon: Kathie Rhodes, MD;  Location: [facility];  Service: Urology;  Laterality: N/A;  . TRANSURETHRAL RESECTION OF BLADDER TUMOR WITH MITOMYCIN-C N/A 08/12/2020   Procedure: TRANSURETHRAL RESECTION OF BLADDER TUMOR WITH GEMCITABINE;  Surgeon: Robley Fries, MD;  Location: [facility];  Service: Urology;  Laterality: N/A;  1 HR  . UPPER GI ENDOSCOPY      Current Medications: Outpatient Medications Prior to Visit  Medication Sig Dispense Refill  . acetaminophen (TYLENOL) 325 MG tablet Take 650 mg by mouth every 6 (six) hours as needed for mild pain, fever or headache.    . allopurinol (ZYLOPRIM) 300 MG tablet Take 300 mg by mouth daily.    . metoprolol tartrate (LOPRESSOR) 25 MG tablet Take 25 mg by mouth daily. Monitor BP before taking second dose SBP > 100     . metoprolol tartrate (LOPRESSOR) 25 MG tablet Take 50 mg by mouth at bedtime. Monitor BP before taking second dose SBP > 100    . omeprazole (PRILOSEC) 20 MG capsule Take 20 mg by mouth every Monday, Wednesday, and Friday.     Marland Kitchen omeprazole (PRILOSEC) 20 MG capsule 1 capsule DAILY (route: oral)    . polyethylene glycol (MIRALAX / GLYCOLAX) 17 g packet Take 17 g by mouth daily as needed for mild constipation. 14 each 1  . prochlorperazine (COMPAZINE) 10 MG tablet Take 1 tablet (10 mg total) by mouth every 6 (six) hours as needed for nausea or vomiting. 30 tablet 0  . traMADol (ULTRAM) 50 MG tablet TAKE 1 TABLET BY MOUTH EVERY 6 HOURS AS NEEDED FOR MODERATE TO SEVERE PAIN (POST-OPERATIVELY) 15 tablet 0  . simvastatin (ZOCOR) 40 MG tablet Take 1 tablet (40 mg total) by mouth at bedtime. 90 tablet 3  Facility-Administered Medications Prior to Visit  Medication Dose Route Frequency Provider Last Rate Last Admin  . gemcitabine (GEMZAR) chemo syringe for bladder instillation 2,000 mg  2,000 mg Bladder Instillation Once Robley Fries, MD         Allergies:   Amiodarone and Oxycodone   Social History   Socioeconomic History  . Marital status: Married    Spouse name: Not on file  . Number of children: 3  . Years of education: Not on file  . Highest education level: Not on file  Occupational History  . Occupation: Higher education careers adviser (RETIRED)    Employer: [employer]  Tobacco Use  . Smoking status: Never Smoker  . Smokeless tobacco: Never Used  Vaping Use  . Vaping Use: Never used  Substance and Sexual Activity  . Alcohol use: Yes    Comment: social, very little amount, tequila  . Drug use: No  . Sexual activity: Not on file  Other Topics Concern  . Not on file  Social History Narrative  . Not on file   Social Determinants of Health   Financial Resource Strain: Not on file  Food Insecurity: Not on file  Transportation Needs: Not on file  Physical Activity: Not on file   Stress: Not on file  Social Connections: Not on file     Family History:  The patient's family history includes AAA (abdominal aortic aneurysm) in his father; Aneurysm in his father; CAD in his father; Cancer in his mother; Heart attack in his paternal grandfather; Hypertension in his father.   ROS:   Please see the history of present illness.    ROS All other systems reviewed and are negative.   PHYSICAL EXAM:   VS:  BP 120/74   Pulse 60   Ht 5\' 9"  (1.753 m)   Wt 173 lb 6.4 oz (78.7 kg)   SpO2 99%   BMI 25.61 kg/m     General: Alert, oriented x3, no distress, appears well Head: no evidence of trauma, PERRL, EOMI, no exophtalmos or lid lag, no myxedema, no xanthelasma; normal ears, nose and oropharynx Neck: normal jugular venous pulsations and no hepatojugular reflux; brisk carotid pulses without delay and no carotid bruits Chest: clear to auscultation, no signs of consolidation by percussion or palpation, normal fremitus, symmetrical and full respiratory excursions Cardiovascular: normal position and quality of the apical impulse, regular rhythm, normal first and second heart sounds, no murmurs, rubs or gallops Abdomen: no tenderness or distention, no masses by palpation, no abnormal pulsatility or arterial bruits, normal bowel sounds, no hepatosplenomegaly Extremities: no clubbing, cyanosis or edema; 2+ radial, ulnar and brachial pulses bilaterally; 2+ right femoral, posterior tibial and dorsalis pedis pulses; 2+ left femoral, posterior tibial and dorsalis pedis pulses; no subclavian or femoral bruits Neurological: Severe left upper extremity paresis and hypotrophy, post polio, left lower extremity is slightly hypotrophic compared with the right for the same reason Psych: Normal mood and affect   Wt Readings from Last 3 Encounters:  06/03/21 173 lb 6.4 oz (78.7 kg)  03/13/21 174 lb 8 oz (79.2 kg)  02/04/21 177 lb 8 oz (80.5 kg)      Studies/Labs Reviewed:   EKG:  EKG is  ordered today.  It shows normal sinus rhythm with somewhat low voltage in multiple leads but otherwise a completely normal tracing, QTC 404 ms  Recent Labs: 11/10/2020: Magnesium 2.2 11/11/2020: TSH 0.466 03/13/2021: ALT 12; BUN 13; Creatinine 1.45; Hemoglobin 10.0; Platelet Count 183; Potassium 3.4; Sodium 141  Lipid Panel    Component Value Date/Time   CHOL 128 09/26/2014 0921   TRIG 146 09/26/2014 0921   HDL 46 09/26/2014 0921   CHOLHDL 2.8 09/26/2014 0921   VLDL 29 09/26/2014 0921   LDLCALC 53 09/26/2014 0921   LDLDIRECT 83.9 10/26/2010 1056      ASSESSMENT:    1. Coronary artery disease involving coronary bypass graft of native heart without angina pectoris   2. Paroxysmal atrial fibrillation (HCC)   3. Essential hypertension   4. Pure hypercholesterolemia   5. Prediabetes   6. Stage 3a chronic kidney disease ([place])      PLAN:  In order of problems listed above:  1. CAD s/p CABG: He remains very active and is asymptomatic.  On statin. Start back on ASA 81 mg daily. 2. AFib: This has occurred twice, immediately after bypass surgery and during acute critical illness in November 2021 in the setting of multiple electrolyte imbalances.  Currently he is not on antiarrhythmics or anticoagulation due to previous bleeding problems.  Only on beta-blockers.  CHA2DS2-VASc 3-4 (age, hypertension, +/-prediabetes, CAD).  No history of CVA/TIA. 3. HTN: Very well controlled now on metoprolol monotherapy, after weight loss.  4. HLP: He is due to have a repeat lipid profile with his primary care provider. On statin. 5. PreDM: Hemoglobin A1c was 6.3% in November 2021 and he has lost quite a bit of weight since then. 6. CKD 3A: Most recent creatinine 1.45, GFR right around 50.   Medication Adjustments/Labs and Tests Ordered: Current medicines are reviewed at length with the patient today.  Concerns regarding medicines are outlined above.  Medication changes, Labs and Tests ordered today  are listed in the Patient Instructions below. Patient Instructions  Medication Instructions:  No changes *If you need a refill on your cardiac medications before your next appointment, please call your pharmacy*   Lab Work: None ordered If you have labs (blood work) drawn today and your tests are completely normal, you will receive your results only by: Marland Kitchen MyChart Message (if you have MyChart) OR . A paper copy in the mail If you have any lab test that is abnormal or we need to change your treatment, we will call you to review the results.   Testing/Procedures: None ordered   Follow-Up: At [facility], you and your health needs are our priority.  As part of our continuing mission to provide you with exceptional heart care, we have created designated Provider Care Teams.  These Care Teams include your primary Cardiologist (physician) and Advanced Practice Providers (APPs -  Physician Assistants and Nurse Practitioners) who all work together to provide you with the care you need, when you need it.  We recommend signing up for the patient portal called "MyChart".  Sign up information is provided on this After Visit Summary.  MyChart is used to connect with patients for Virtual Visits (Telemedicine).  Patients are able to view lab/test results, encounter notes, upcoming appointments, etc.  Non-urgent messages can be sent to your provider as well.   To learn more about what you can do with MyChart, go to [email or web address].    Your next appointment:   12 month(s)  The format for your next appointment:   In Person  Provider:   You may see Sanda Klein, MD or one of the following Advanced Practice Providers on your designated Care Team:    Almyra Deforest, PA-C  Fabian Sharp, PA-C or   Roby Lofts, [place]  Signed, Sanda Klein, MD  06/03/2021 11:54 AM    [facility] [address], [place], [place]  [address] Phone: [phone]; Fax: [phone]

## 2021-07-10 ENCOUNTER — Inpatient Hospital Stay: Payer: Medicare Other | Attending: Oncology

## 2021-07-10 ENCOUNTER — Other Ambulatory Visit: Payer: Self-pay

## 2021-07-10 DIAGNOSIS — C679 Malignant neoplasm of bladder, unspecified: Secondary | ICD-10-CM | POA: Insufficient documentation

## 2021-07-10 DIAGNOSIS — Z452 Encounter for adjustment and management of vascular access device: Secondary | ICD-10-CM | POA: Diagnosis present

## 2021-07-10 DIAGNOSIS — Z95828 Presence of other vascular implants and grafts: Secondary | ICD-10-CM

## 2021-07-10 MED ORDER — HEPARIN SOD (PORK) LOCK FLUSH 100 UNIT/ML IV SOLN
500.0000 [IU] | Freq: Once | INTRAVENOUS | Status: AC
  Administered 2021-07-10: 500 [IU] via INTRAVENOUS
  Filled 2021-07-10: qty 5

## 2021-07-10 MED ORDER — SODIUM CHLORIDE 0.9% FLUSH
10.0000 mL | INTRAVENOUS | Status: DC | PRN
  Administered 2021-07-10: 10 mL via INTRAVENOUS
  Filled 2021-07-10: qty 10

## 2021-07-27 ENCOUNTER — Other Ambulatory Visit (HOSPITAL_COMMUNITY): Payer: Self-pay | Admitting: Urology

## 2021-07-27 ENCOUNTER — Other Ambulatory Visit: Payer: Self-pay

## 2021-07-27 ENCOUNTER — Ambulatory Visit (HOSPITAL_COMMUNITY)
Admission: RE | Admit: 2021-07-27 | Discharge: 2021-07-27 | Disposition: A | Discharge status: Home / Self Care | Payer: Medicare Other | Source: Ambulatory Visit | Attending: Urology | Admitting: Urology

## 2021-07-27 DIAGNOSIS — C673 Malignant neoplasm of anterior wall of bladder: Secondary | ICD-10-CM | POA: Insufficient documentation

## 2021-09-11 ENCOUNTER — Inpatient Hospital Stay: Payer: Medicare Other | Attending: Oncology

## 2021-09-11 ENCOUNTER — Other Ambulatory Visit: Payer: Medicare Other

## 2021-09-11 ENCOUNTER — Inpatient Hospital Stay (HOSPITAL_BASED_OUTPATIENT_CLINIC_OR_DEPARTMENT_OTHER): Payer: Medicare Other | Admitting: Oncology

## 2021-09-11 ENCOUNTER — Other Ambulatory Visit: Payer: Self-pay

## 2021-09-11 VITALS — BP 126/77 | HR 59 | Temp 97.8°F | Resp 17 | Ht 69.0 in | Wt 181.2 lb

## 2021-09-11 DIAGNOSIS — C679 Malignant neoplasm of bladder, unspecified: Secondary | ICD-10-CM

## 2021-09-11 DIAGNOSIS — Z9221 Personal history of antineoplastic chemotherapy: Secondary | ICD-10-CM | POA: Insufficient documentation

## 2021-09-11 DIAGNOSIS — D6481 Anemia due to antineoplastic chemotherapy: Secondary | ICD-10-CM | POA: Insufficient documentation

## 2021-09-11 DIAGNOSIS — N189 Chronic kidney disease, unspecified: Secondary | ICD-10-CM | POA: Diagnosis not present

## 2021-09-11 DIAGNOSIS — T451X5A Adverse effect of antineoplastic and immunosuppressive drugs, initial encounter: Secondary | ICD-10-CM | POA: Insufficient documentation

## 2021-09-11 DIAGNOSIS — Z8551 Personal history of malignant neoplasm of bladder: Secondary | ICD-10-CM | POA: Diagnosis present

## 2021-09-11 DIAGNOSIS — Z95828 Presence of other vascular implants and grafts: Secondary | ICD-10-CM

## 2021-09-11 LAB — CBC WITH DIFFERENTIAL (CANCER CENTER ONLY)
Abs Immature Granulocytes: 0.01 10*3/uL (ref 0.00–0.07)
Basophils Absolute: 0 10*3/uL (ref 0.0–0.1)
Basophils Relative: 1 %
Eosinophils Absolute: 0.4 10*3/uL (ref 0.0–0.5)
Eosinophils Relative: 8 %
HCT: 33.3 % — ABNORMAL LOW (ref 39.0–52.0)
Hemoglobin: 11.2 g/dL — ABNORMAL LOW (ref 13.0–17.0)
Immature Granulocytes: 0 %
Lymphocytes Relative: 17 %
Lymphs Abs: 0.9 10*3/uL (ref 0.7–4.0)
MCH: 30.9 pg (ref 26.0–34.0)
MCHC: 33.6 g/dL (ref 30.0–36.0)
MCV: 92 fL (ref 80.0–100.0)
Monocytes Absolute: 0.5 10*3/uL (ref 0.1–1.0)
Monocytes Relative: 9 %
Neutro Abs: 3.4 10*3/uL (ref 1.7–7.7)
Neutrophils Relative %: 65 %
Platelet Count: 152 10*3/uL (ref 150–400)
RBC: 3.62 MIL/uL — ABNORMAL LOW (ref 4.22–5.81)
RDW: 14.1 % (ref 11.5–15.5)
WBC Count: 5.2 10*3/uL (ref 4.0–10.5)
nRBC: 0 % (ref 0.0–0.2)

## 2021-09-11 MED ORDER — HEPARIN SOD (PORK) LOCK FLUSH 100 UNIT/ML IV SOLN
500.0000 [IU] | INTRAVENOUS | Status: AC | PRN
  Administered 2021-09-11: 500 [IU]

## 2021-09-11 MED ORDER — SODIUM CHLORIDE 0.9% FLUSH
10.0000 mL | Freq: Once | INTRAVENOUS | Status: AC
  Administered 2021-09-11: 10 mL

## 2021-09-11 NOTE — Progress Notes (Signed)
Hematology and Oncology Follow Up Visit  Hunter [name] [id number] [birth year] 75 y.o. 09/11/2021 9:36 AM [name] [name], [name], FNP   Principle Diagnosis: 75 year old man with bladder cancer diagnosed in August 2021.  He presented with T2N0 high-grade urothelial carcinoma and had no residual disease after surgical resection in February 2022.   Prior Therapy:  He is status post TURBT completed in August 2021 which confirmed the presence of muscle invasion.  Neoadjuvant chemotherapy utilizing cisplatin and gemcitabine started on September 17, 2020.  He is status post 3 cycles of therapy completed in November 2021.  He is status post robotic assisted laparoscopic cystoprostatectomy and ileal conduit formation and lymphadenectomy completed in February 2022.  The final pathology showed residual focus of urothelial carcinoma in situ without any lymph node involvement.  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  Current therapy: Active surveillance.     Interim History: [name] is here for return evaluation.  Since last visit, he reports no major changes in his health.  He continues to recover well from his surgery and has resumed most activities of daily living.  He is able to play golf and eating well.  He denies any back pain shoulder pain.  He denies any abdominal distention.      Medications: Reviewed without changes. Current Outpatient Medications  Medication Sig Dispense Refill   acetaminophen (TYLENOL) 325 MG tablet Take 650 mg by mouth every 6 (six) hours as needed for mild pain, fever or headache.     allopurinol (ZYLOPRIM) 300 MG tablet Take 300 mg by mouth daily.     metoprolol tartrate (LOPRESSOR) 25 MG tablet Take 25 mg by mouth daily. Monitor BP before taking second dose SBP > 100     metoprolol tartrate (LOPRESSOR) 25 MG tablet Take 50 mg by mouth at bedtime. Monitor BP before taking second dose SBP > 100     omeprazole (PRILOSEC) 20 MG  capsule Take 20 mg by mouth every Monday, Wednesday, and Friday.      omeprazole (PRILOSEC) 20 MG capsule 1 capsule DAILY (route: oral)     polyethylene glycol (MIRALAX / GLYCOLAX) 17 g packet Take 17 g by mouth daily as needed for mild constipation. 14 each 1   prochlorperazine (COMPAZINE) 10 MG tablet Take 1 tablet (10 mg total) by mouth every 6 (six) hours as needed for nausea or vomiting. 30 tablet 0   simvastatin (ZOCOR) 40 MG tablet Take 1 tablet (40 mg total) by mouth at bedtime. 90 tablet 3   No current facility-administered medications for this visit.   Facility-Administered Medications Ordered in Other Visits  Medication Dose Route Frequency Provider Last Rate Last Admin   gemcitabine (GEMZAR) chemo syringe for bladder instillation 2,000 mg  2,000 mg Bladder Instillation Once Robley Fries, MD         Allergies:  Allergies  Allergen Reactions   Amiodarone     Hallucinations    Oxycodone Other (See Comments)    Bad dreams      Physical Exam:     Blood pressure 126/77, pulse (!) 59, temperature 97.8 F (36.6 C), temperature source Oral, resp. rate 17, height '5\' 9"'$  (1.753 m), weight 181 lb 3.2 oz (82.2 kg), SpO2 100 %.     ECOG: 1   General appearance: Comfortable appearing without any discomfort Head: Normocephalic without any trauma Oropharynx: Mucous membranes are moist and pink without any thrush or ulcers. Eyes: Pupils are equal and round reactive to  light. Lymph nodes: No cervical, supraclavicular, inguinal or axillary lymphadenopathy.   Heart:regular rate and rhythm.  S1 and S2 without leg edema. Lung: Clear without any rhonchi or wheezes.  No dullness to percussion. Abdomin: Soft, nontender, nondistended with good bowel sounds.  No hepatosplenomegaly. Musculoskeletal: No joint deformity or effusion.  Full range of motion noted. Neurological: No deficits noted on motor, sensory and deep tendon reflex exam. Skin: No petechial rash or dryness.   Appeared moist.            Lab Results: Lab Results  Component Value Date   WBC 6.9 03/13/2021   HGB 10.0 (L) 03/13/2021   HCT 30.1 (L) 03/13/2021   MCV 93.2 03/13/2021   PLT 183 03/13/2021     Chemistry      Component Value Date/Time   NA 141 03/13/2021 0950   NA 142 08/24/2019 1415   K 3.4 (L) 03/13/2021 0950   CL 110 03/13/2021 0950   CO2 24 03/13/2021 0950   BUN 13 03/13/2021 0950   BUN 36 (H) 08/24/2019 1415   CREATININE 1.45 (H) 03/13/2021 0950   CREATININE 1.25 09/26/2014 0921      Component Value Date/Time   CALCIUM 8.7 (L) 03/13/2021 0950   CALCIUM 8.6 12/03/2020 0935   ALKPHOS 88 03/13/2021 0950   AST 16 03/13/2021 0950   ALT 12 03/13/2021 0950   BILITOT 0.3 03/13/2021 0950     IMPRESSION: 1. Post cystoprostatectomy and creation of urinary diversion with ileal conduit. 2. No signs of adenopathy or metastatic disease in the abdomen or pelvis. 3. No upper tract lesion. 4. Nephrolithiasis. 5. Scattered calculi in the LEFT kidney largest in the lower pole approximately 7 mm. Potential migration of an interpolar calculus that measured approximately 5 mm since the previous study. Not visible on the current exam. 6. 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 Recommend follow-up every 5 years. This recommendation follows ACR consensus guidelines: White Paper of the ACR Incidental Findings Committee II on Vascular Findings. J Am Coll Radiol 2013; 10:789-794.   Impression and Plan:  75 year old man with:   1.   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  He has a complete response to neoadjuvant chemotherapy and no residual disease after surgery.   The natural course of his disease was reviewed at this time and risk of relapse was assessed.   He has low risk of relapse given his excellent response to therapy.  The role for additional systemic therapy was discussed gust this time and will be deferred unless he has recurrent disease.  Imaging studies obtained on July 27, 2021 under the care of Dr. Tammi Klippel were personally reviewed today and discussed with the patient.  He continues to have no evidence of metastatic disease.  He is agreeable to continue with active surveillance at this time.   2.  IV access: Risks and benefits of removing his Port-A-Cath was discussed at this time.  He opted to keep it for 6 more months.  He will be open to the idea of his neck scan is clear.   3.  Chronic renal insufficiency: His kidney function continues to be close to baseline.   4.  Anemia: Related to malignancy and chemotherapy.  His hemoglobin is up to 11 today and approaching recovery.  5.  Follow-up: In 6 months  for follow-up evaluation.   30  minutes were dedicated to this encounter.  Time was spent on reviewing his labs, imaging studies, treatment choices and future plan of care discussion.    Zola Button, MD 9/16/20229:36 AM

## 2021-10-23 IMAGING — CT CT CHEST-ABD-PELV W/ CM
4 of 12 series · 11 of 46 positions shown, 17 images · IV contrast (APPLIED)
Comparison: CT abdomen pelvis, 10/29/2019

CLINICAL DATA: Bladder cancer, chemotherapy to begin next week,
hematuria, planned cystoprostatectomy

EXAM:
CT CHEST, ABDOMEN, AND PELVIS WITH CONTRAST
TECHNIQUE: Multidetector CT imaging of the chest, abdomen and pelvis was
performed following the standard protocol during bolus
administration of intravenous contrast.
CONTRAST:  100mL OMNIPAQUE IOHEXOL 300 MG/ML SOLN, additional oral
enteric contrast

[Series 5: axial post · axial · 0.82mm/px · z∈[+945,+1195]mm · 3 of 100 slices shown]
[im 25/100  soft-tissue]
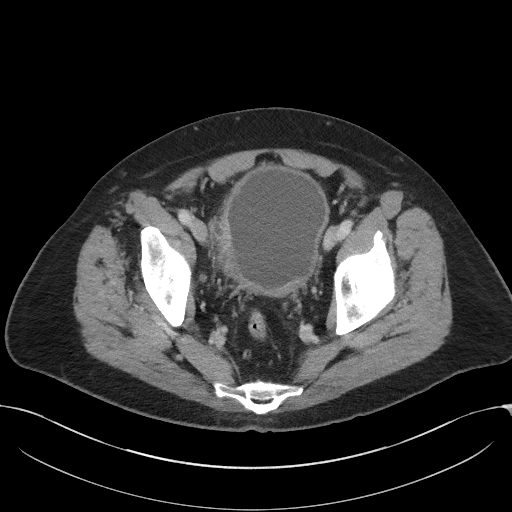
[im 50/100  soft-tissue]
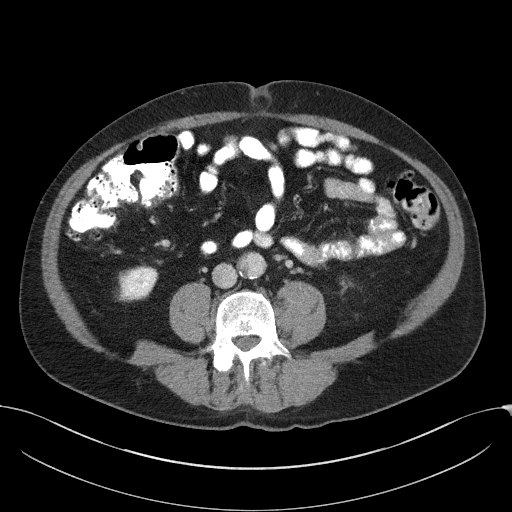
[im 75/100  soft-tissue]
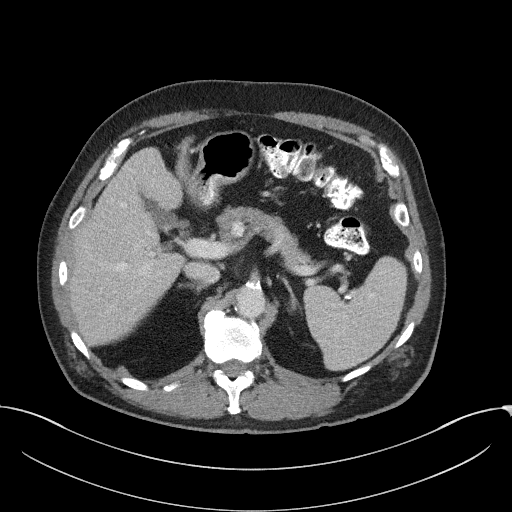

[Series 9: lung post · axial · 0.82mm/px · z∈[+1238,+1280]mm · 2 of 146 slices shown]
[im 21/146  bone]
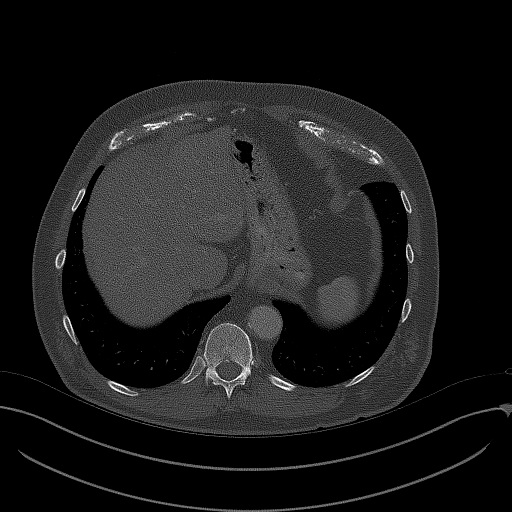
[im 42/146  bone]
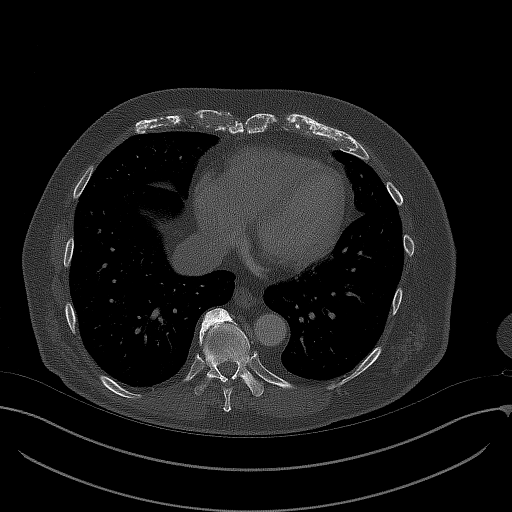

[Series 10: coronal post · coronal · 0.93mm/px · 2 of 110 slices shown, 3 images]
[im 37/110  soft-tissue]
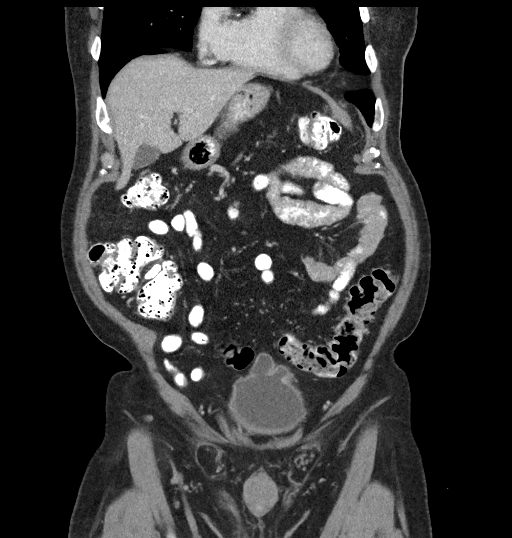
[im 37/110  bone]
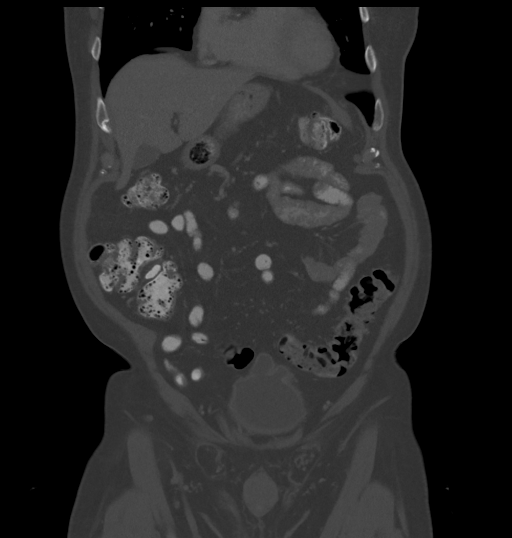
[im 73/110  soft-tissue]
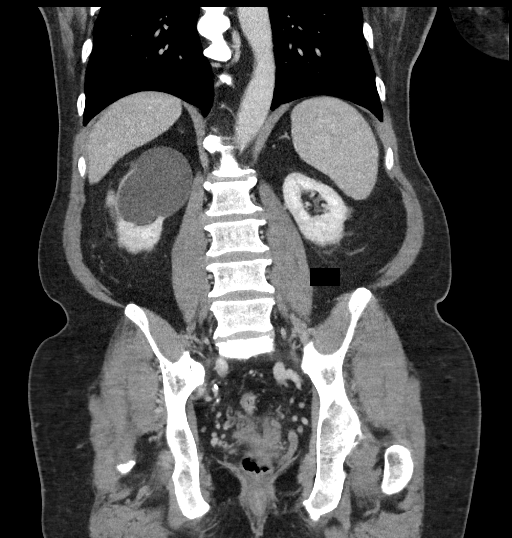

[Series 14: axial delay · axial · delayed · 0.96mm/px · z∈[+957,+1302]mm · 4 of 116 slices shown, 9 images]
[im 24/116  soft-tissue]
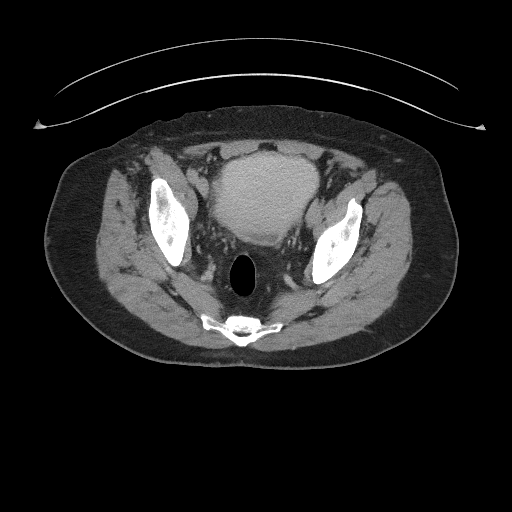
[im 24/116  lung]
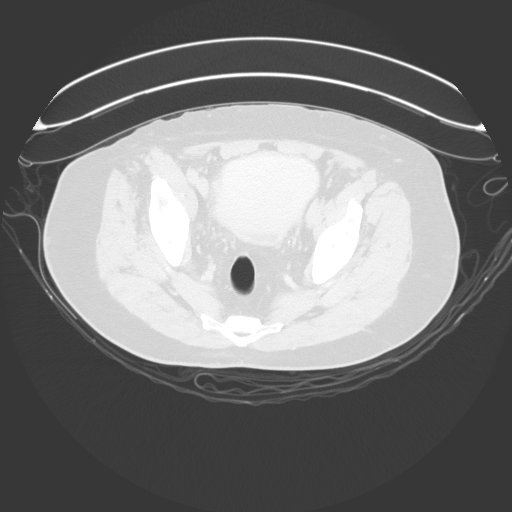
[im 24/116  bone]
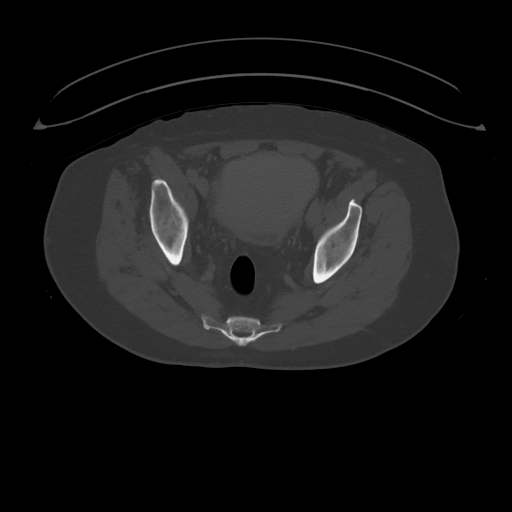
[im 47/116  soft-tissue]
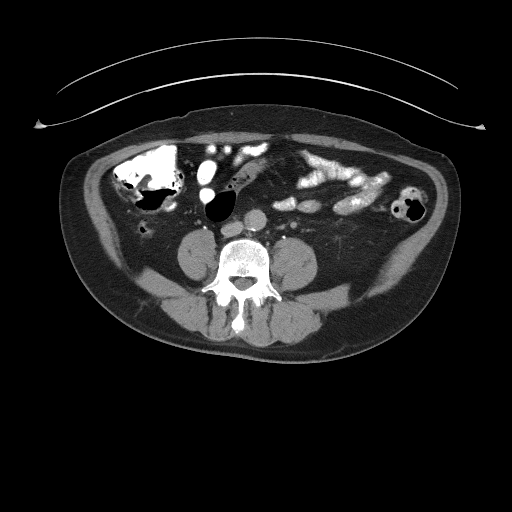
[im 47/116  lung]
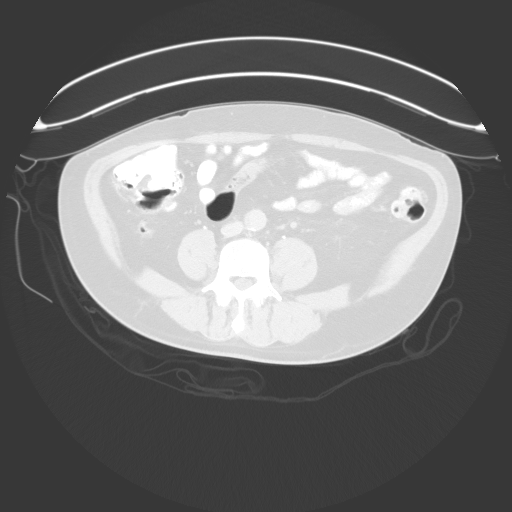
[im 70/116  soft-tissue]
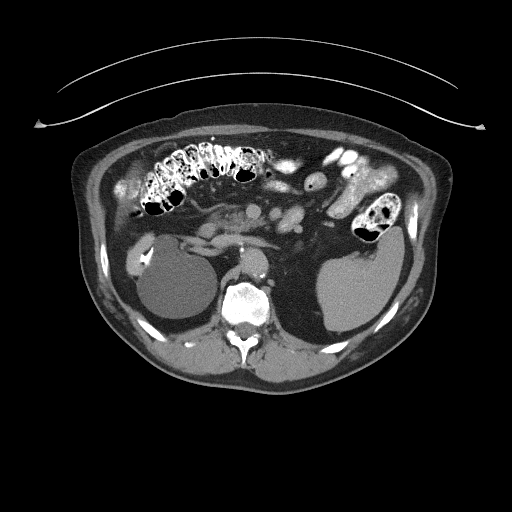
[im 70/116  lung]
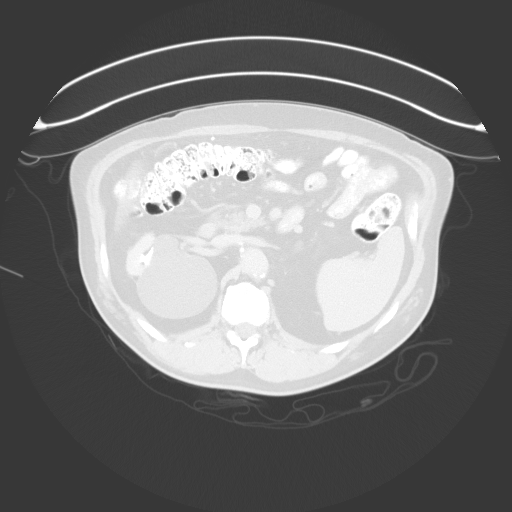
[im 93/116  soft-tissue]
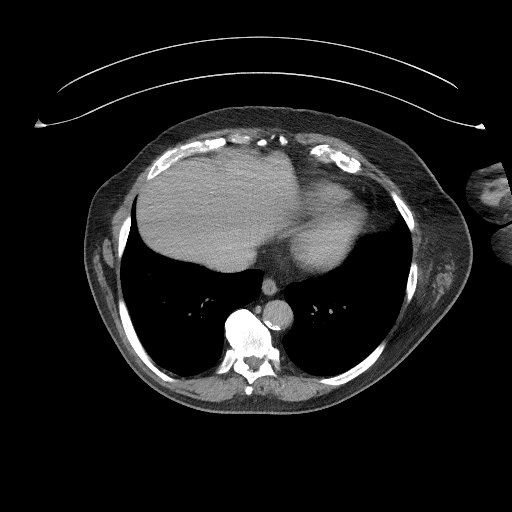
[im 93/116  lung]
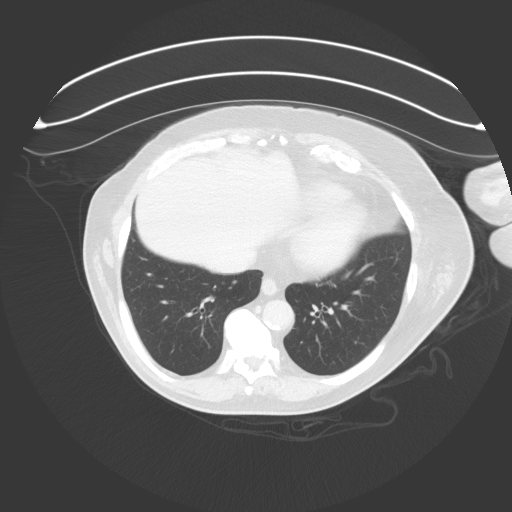

[11 of 46 positions shown; findings below may reference images not displayed]

FINDINGS: CT CHEST FINDINGS

Cardiovascular: Aortic atherosclerosis. Normal heart size.
Three-vessel coronary artery calcifications. No pericardial
effusion.

Mediastinum/Nodes: No enlarged mediastinal, hilar, or axillary lymph
nodes. Small hiatal hernia. Thyroid gland, trachea, and esophagus
demonstrate no significant findings.

Lungs/Pleura: Mild, diffuse bilateral bronchial wall thickening.
Innumerable tiny centrilobular pulmonary nodules, most numerous in
the lung apices. No pleural effusion or pneumothorax.

Musculoskeletal: No chest wall mass or suspicious bone lesions
identified.

CT ABDOMEN PELVIS FINDINGS

Hepatobiliary: No solid liver abnormality is seen. No gallstones,
gallbladder wall thickening, or biliary dilatation.

Pancreas: Unremarkable. No pancreatic ductal dilatation or
surrounding inflammatory changes.

Spleen: Normal in size without significant abnormality.

Adrenals/Urinary Tract: Adrenal glands are unremarkable. Large
exophytic cyst of the superior pole of the right kidney. Multiple
small nonobstructive left renal calculi. No ureteral calculus or
hydronephrosis. There is extensive thickening of the superior
urinary bladder wall (series 11, image 72), with multiple small
diverticula and extensive associated fat stranding. There is a
prominent diverticulum of the bladder dome.

Stomach/Bowel: Stomach is within normal limits. Appendix appears
normal. No evidence of bowel wall thickening, distention, or
inflammatory changes.

Vascular/Lymphatic: Severe, irregular aortic atherosclerosis with
multiple chronic appearing penetrating ulcerations (series 10, image
60). Overall appearance and configuration is not significantly
changed compared to prior examination. There is a prominent
although subcentimeter left iliac lymph node measuring 5 mm,
increased in size compared to prior examination at which time it
measured 3 mm (series 5, image 72). No other changed or abnormally
enlarged lymph nodes.

Reproductive: Mild prostatomegaly.

Other: Small, fat containing bilateral inguinal hernias. No
abdominopelvic ascites.

Musculoskeletal: No acute or significant osseous findings.
IMPRESSION: 1. There is extensive thickening of the superior urinary bladder
wall, with multiple small diverticula and extensive associated fat
stranding. Findings are consistent with known bladder malignancy.
2. There is a prominent although subcentimeter left iliac lymph node
measuring 5 mm, increased in size compared to prior examination at
which time it measured 3 mm, suspicious, although not definite for
nodal metastatic disease. Attention on follow-up. No other changed
or abnormally enlarged lymph nodes in the chest, abdomen, or pelvis.
3. No evidence of distant metastatic disease in the chest, abdomen,
or pelvis.
4. Prostatomegaly.
5. Nonobstructive left nephrolithiasis.
6. Mild, diffuse bilateral bronchial wall thickening with
innumerable tiny centrilobular pulmonary nodules, most numerous in
the lung apices. Findings are most consistent with smoking-related
respiratory bronchiolitis.
7. Severe, irregular aortic atherosclerosis with multiple chronic
appearing penetrating ulcerations. Overall appearance and
configuration is not significantly changed compared to prior
examination. Aortic Atherosclerosis ([id number]).
8. Coronary artery disease.

## 2021-11-13 ENCOUNTER — Inpatient Hospital Stay: Payer: Medicare Other | Attending: Oncology

## 2021-11-13 ENCOUNTER — Other Ambulatory Visit: Payer: Self-pay

## 2021-11-13 DIAGNOSIS — Z8551 Personal history of malignant neoplasm of bladder: Secondary | ICD-10-CM | POA: Insufficient documentation

## 2021-11-13 DIAGNOSIS — Z452 Encounter for adjustment and management of vascular access device: Secondary | ICD-10-CM | POA: Diagnosis not present

## 2021-11-13 DIAGNOSIS — Z95828 Presence of other vascular implants and grafts: Secondary | ICD-10-CM

## 2021-11-13 MED ORDER — SODIUM CHLORIDE 0.9% FLUSH
10.0000 mL | Freq: Once | INTRAVENOUS | Status: AC
  Administered 2021-11-13: 10 mL

## 2021-11-13 MED ORDER — HEPARIN SOD (PORK) LOCK FLUSH 100 UNIT/ML IV SOLN
500.0000 [IU] | Freq: Once | INTRAVENOUS | Status: AC
  Administered 2021-11-13: 500 [IU] via INTRAVENOUS

## 2021-12-06 ENCOUNTER — Other Ambulatory Visit: Payer: Self-pay | Admitting: Physician Assistant

## 2021-12-28 IMAGING — DX DG HIP (WITH OR WITHOUT PELVIS) 2-3V*R*
3 series · 3 of 3 positions shown · non-contrast
Comparison: None.

CLINICAL DATA: Right hip pain since being in hospital over the last
4 days. No prior injuries.

EXAM:
DG HIP (WITH OR WITHOUT PELVIS) 2-3V RIGHT

[hip ap]
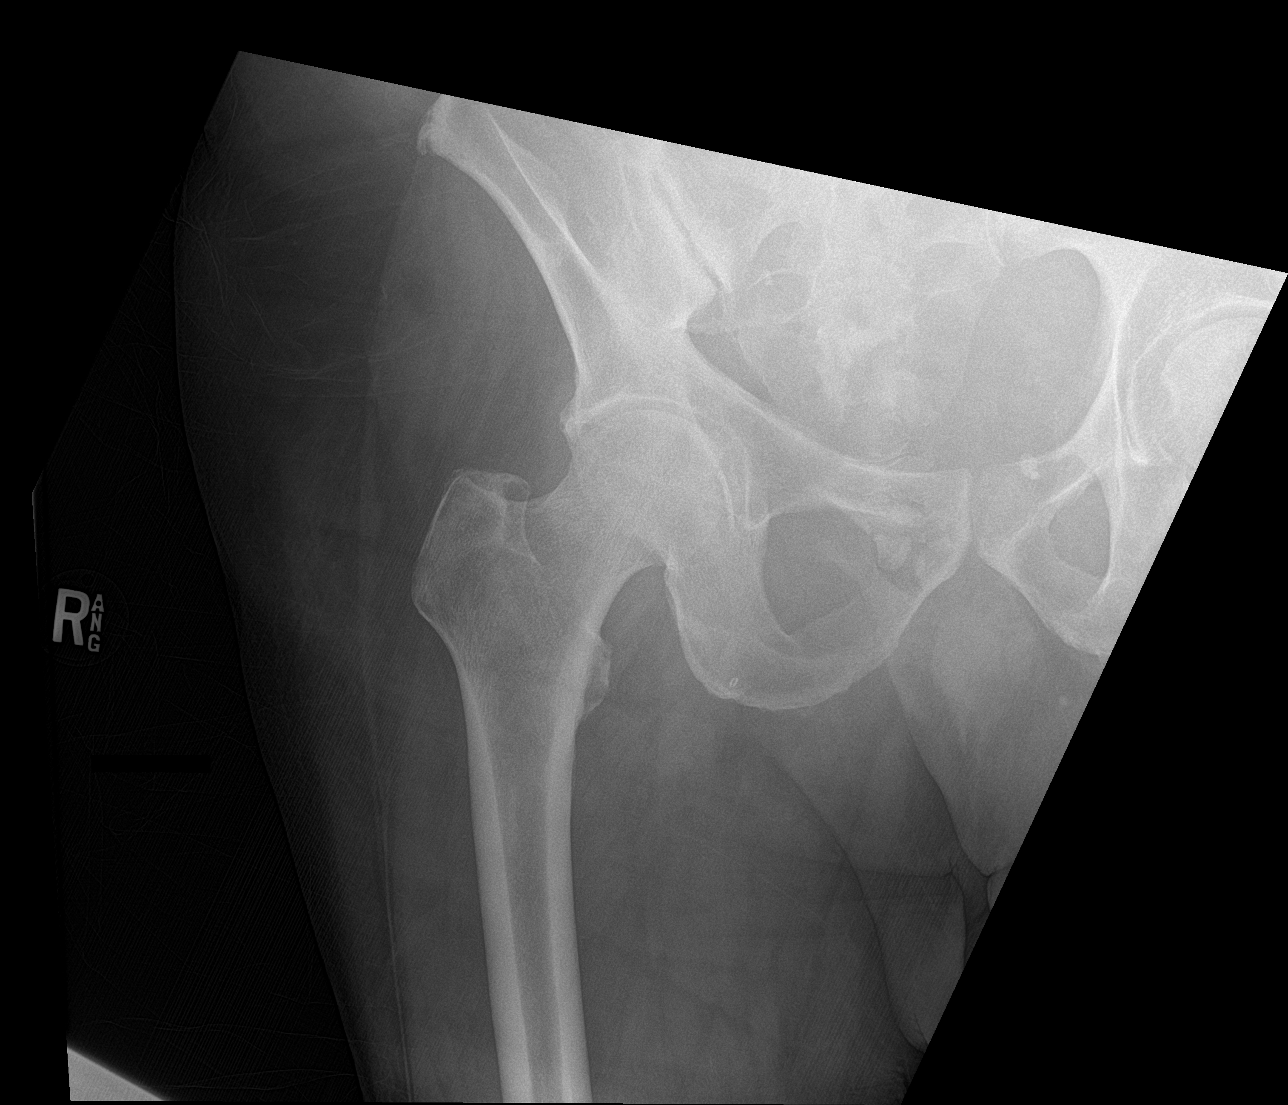

[hip lat]
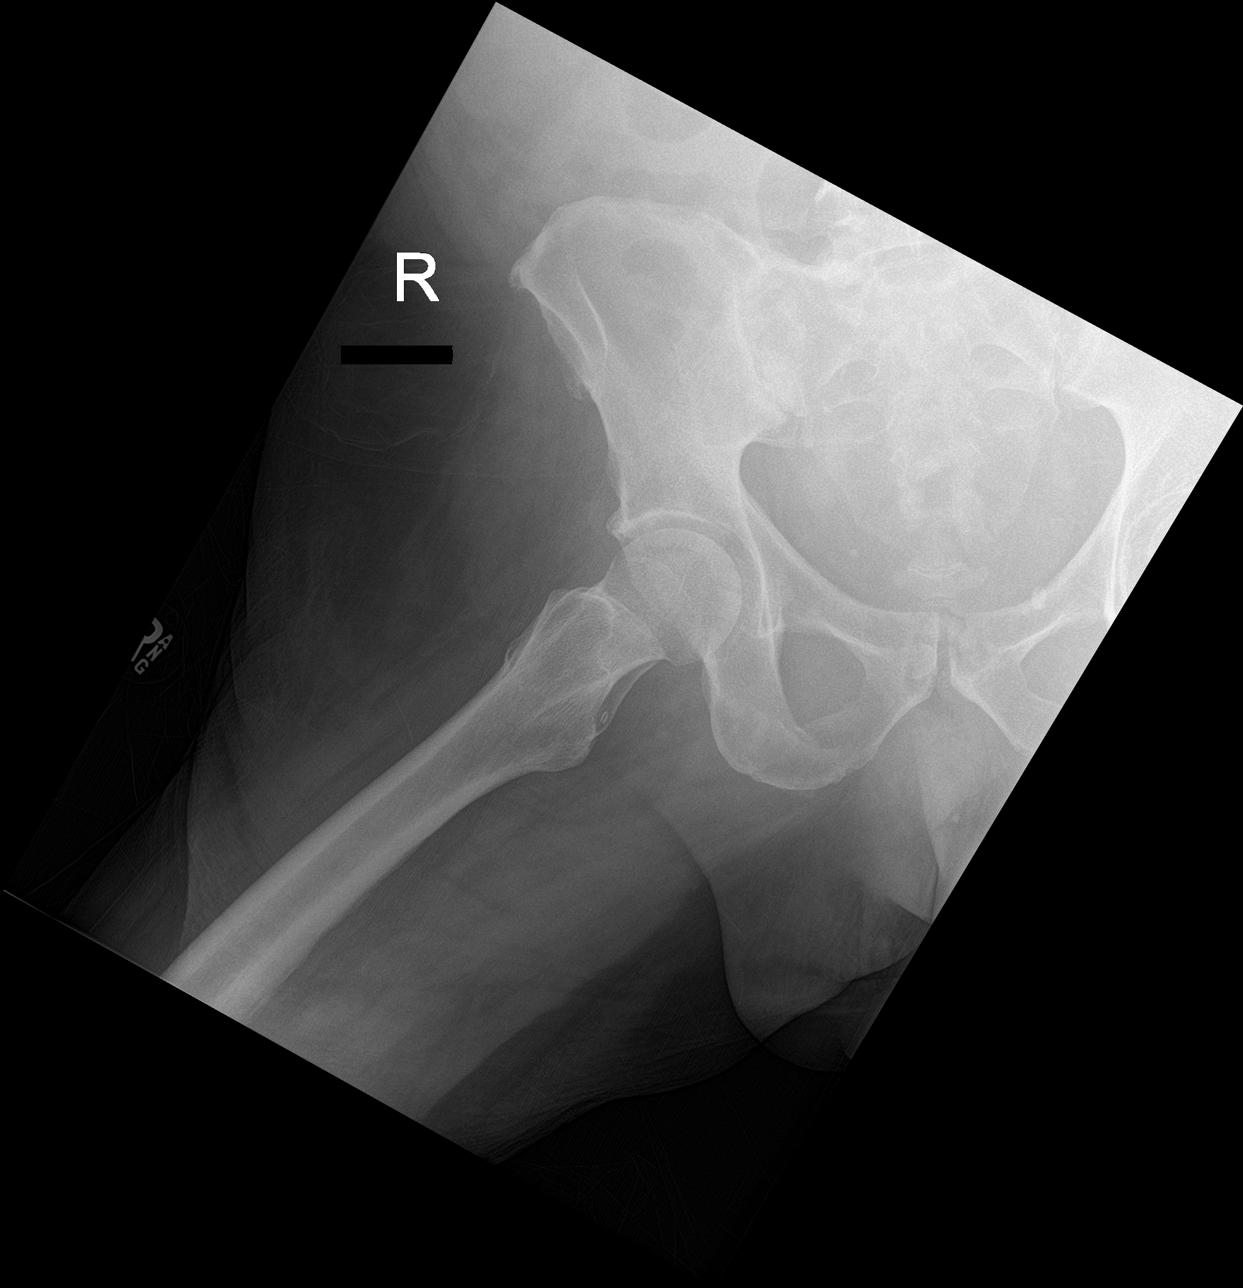

[pelvis ap]
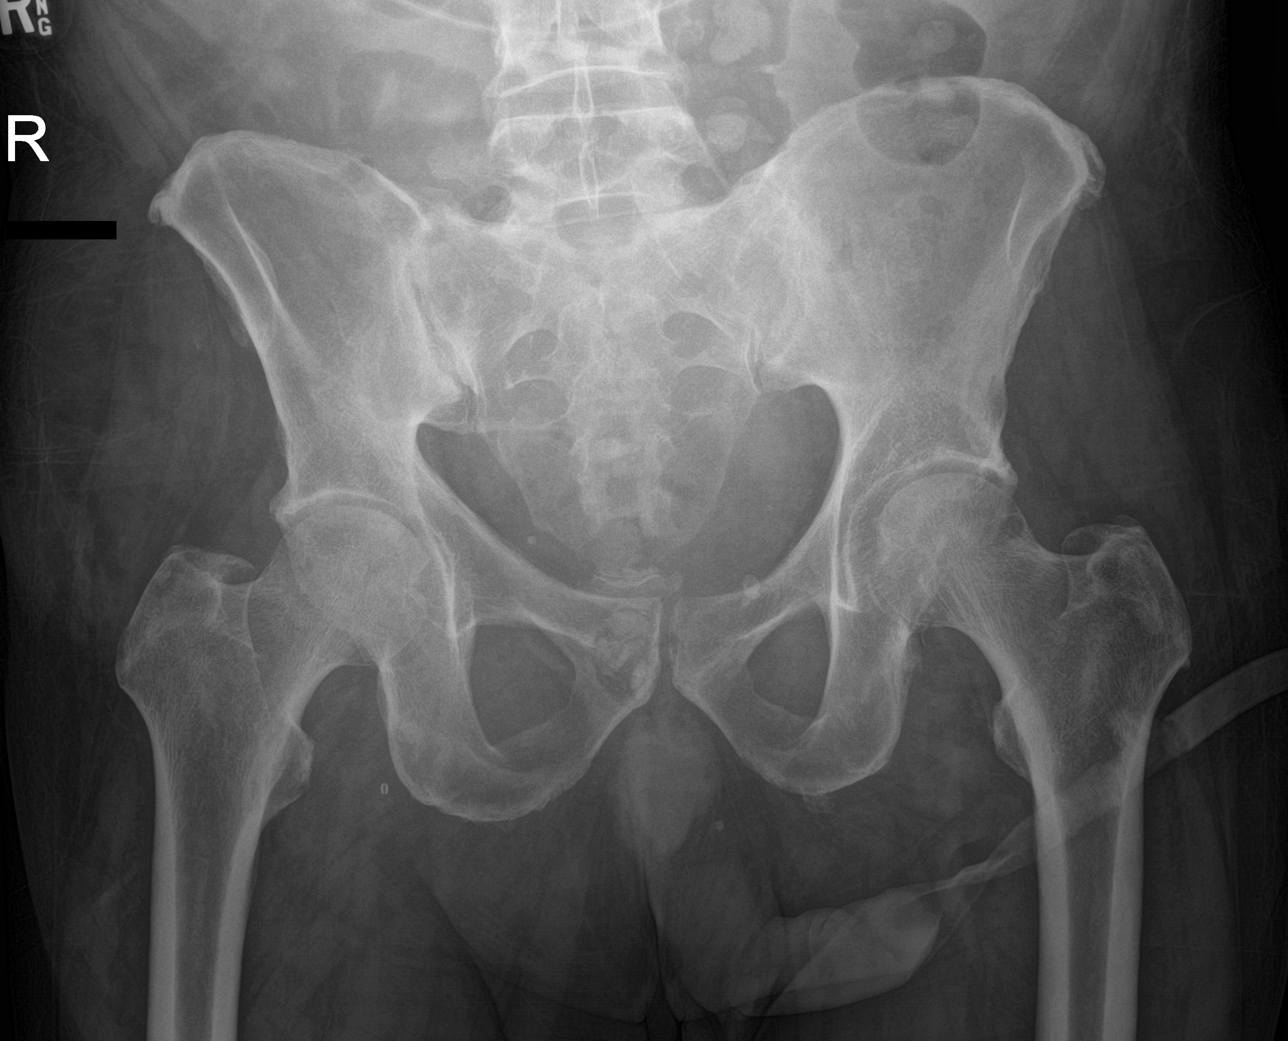

[3 of 3 positions shown; findings below may reference images not displayed]

FINDINGS: There is no evidence of hip fracture or dislocation. There is no
evidence of arthropathy or other focal bone abnormality.
IMPRESSION: Negative.

## 2022-01-15 ENCOUNTER — Inpatient Hospital Stay: Payer: Medicare Other | Attending: Oncology

## 2022-01-15 ENCOUNTER — Other Ambulatory Visit: Payer: Self-pay

## 2022-01-15 DIAGNOSIS — Z8551 Personal history of malignant neoplasm of bladder: Secondary | ICD-10-CM | POA: Insufficient documentation

## 2022-01-15 DIAGNOSIS — Z452 Encounter for adjustment and management of vascular access device: Secondary | ICD-10-CM | POA: Diagnosis not present

## 2022-01-15 DIAGNOSIS — Z95828 Presence of other vascular implants and grafts: Secondary | ICD-10-CM

## 2022-01-15 MED ORDER — HEPARIN SOD (PORK) LOCK FLUSH 100 UNIT/ML IV SOLN
500.0000 [IU] | Freq: Once | INTRAVENOUS | Status: AC
  Administered 2022-01-15: 500 [IU] via INTRAVENOUS

## 2022-01-15 MED ORDER — SODIUM CHLORIDE 0.9% FLUSH
10.0000 mL | INTRAVENOUS | Status: DC | PRN
  Administered 2022-01-15: 10 mL via INTRAVENOUS

## 2022-01-23 ENCOUNTER — Other Ambulatory Visit: Payer: Self-pay | Admitting: Cardiovascular Disease

## 2022-02-01 ENCOUNTER — Other Ambulatory Visit (HOSPITAL_COMMUNITY): Payer: Self-pay | Admitting: Urology

## 2022-02-01 ENCOUNTER — Other Ambulatory Visit: Payer: Self-pay

## 2022-02-01 ENCOUNTER — Ambulatory Visit (HOSPITAL_COMMUNITY)
Admission: RE | Admit: 2022-02-01 | Discharge: 2022-02-01 | Disposition: A | Discharge status: Home / Self Care | Payer: Medicare Other | Source: Ambulatory Visit | Attending: Urology | Admitting: Urology

## 2022-02-01 DIAGNOSIS — C673 Malignant neoplasm of anterior wall of bladder: Secondary | ICD-10-CM | POA: Insufficient documentation

## 2022-03-11 ENCOUNTER — Telehealth: Payer: Self-pay | Admitting: Oncology

## 2022-03-11 NOTE — Telephone Encounter (Signed)
Called and spoke with patient's spouse regarding 03/17 appointment, patient is notified. ?

## 2022-03-12 ENCOUNTER — Other Ambulatory Visit: Payer: Self-pay

## 2022-03-12 ENCOUNTER — Inpatient Hospital Stay: Payer: Medicare Other | Attending: Oncology

## 2022-03-12 ENCOUNTER — Inpatient Hospital Stay (HOSPITAL_BASED_OUTPATIENT_CLINIC_OR_DEPARTMENT_OTHER): Payer: Medicare Other | Admitting: Oncology

## 2022-03-12 VITALS — BP 114/68 | HR 68 | Temp 97.9°F | Resp 17 | Ht 69.0 in | Wt 179.4 lb

## 2022-03-12 DIAGNOSIS — K435 Parastomal hernia without obstruction or  gangrene: Secondary | ICD-10-CM | POA: Insufficient documentation

## 2022-03-12 DIAGNOSIS — K4020 Bilateral inguinal hernia, without obstruction or gangrene, not specified as recurrent: Secondary | ICD-10-CM | POA: Insufficient documentation

## 2022-03-12 DIAGNOSIS — Z8551 Personal history of malignant neoplasm of bladder: Secondary | ICD-10-CM | POA: Diagnosis present

## 2022-03-12 DIAGNOSIS — C679 Malignant neoplasm of bladder, unspecified: Secondary | ICD-10-CM

## 2022-03-12 DIAGNOSIS — N200 Calculus of kidney: Secondary | ICD-10-CM | POA: Diagnosis not present

## 2022-03-12 DIAGNOSIS — Z906 Acquired absence of other parts of urinary tract: Secondary | ICD-10-CM | POA: Diagnosis not present

## 2022-03-12 DIAGNOSIS — N189 Chronic kidney disease, unspecified: Secondary | ICD-10-CM | POA: Insufficient documentation

## 2022-03-12 DIAGNOSIS — D631 Anemia in chronic kidney disease: Secondary | ICD-10-CM | POA: Diagnosis not present

## 2022-03-12 DIAGNOSIS — Z95828 Presence of other vascular implants and grafts: Secondary | ICD-10-CM

## 2022-03-12 DIAGNOSIS — Z9221 Personal history of antineoplastic chemotherapy: Secondary | ICD-10-CM | POA: Insufficient documentation

## 2022-03-12 LAB — CBC WITH DIFFERENTIAL (CANCER CENTER ONLY)
Abs Immature Granulocytes: 0.03 10*3/uL (ref 0.00–0.07)
Basophils Absolute: 0 10*3/uL (ref 0.0–0.1)
Basophils Relative: 1 %
Eosinophils Absolute: 0.3 10*3/uL (ref 0.0–0.5)
Eosinophils Relative: 4 %
HCT: 34.3 % — ABNORMAL LOW (ref 39.0–52.0)
Hemoglobin: 11.4 g/dL — ABNORMAL LOW (ref 13.0–17.0)
Immature Granulocytes: 0 %
Lymphocytes Relative: 17 %
Lymphs Abs: 1.3 10*3/uL (ref 0.7–4.0)
MCH: 30.2 pg (ref 26.0–34.0)
MCHC: 33.2 g/dL (ref 30.0–36.0)
MCV: 90.7 fL (ref 80.0–100.0)
Monocytes Absolute: 0.5 10*3/uL (ref 0.1–1.0)
Monocytes Relative: 7 %
Neutro Abs: 5.5 10*3/uL (ref 1.7–7.7)
Neutrophils Relative %: 71 %
Platelet Count: 182 10*3/uL (ref 150–400)
RBC: 3.78 MIL/uL — ABNORMAL LOW (ref 4.22–5.81)
RDW: 14.5 % (ref 11.5–15.5)
WBC Count: 7.6 10*3/uL (ref 4.0–10.5)
nRBC: 0 % (ref 0.0–0.2)

## 2022-03-12 MED ORDER — HEPARIN SOD (PORK) LOCK FLUSH 100 UNIT/ML IV SOLN
500.0000 [IU] | Freq: Once | INTRAVENOUS | Status: AC
  Administered 2022-03-12: 500 [IU] via INTRAVENOUS

## 2022-03-12 MED ORDER — SODIUM CHLORIDE 0.9% FLUSH
10.0000 mL | Freq: Once | INTRAVENOUS | Status: AC
  Administered 2022-03-12: 10 mL

## 2022-03-12 NOTE — Progress Notes (Signed)
Hematology and Oncology Follow Up Visit ? ?Hunter [name] ?[id number] ?[birth year] 76 y.o. ?03/12/2022 8:29 AM ?Hunter [name] [name], Hunter Grist, FNP  ? 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  He had a complete response after surgical resection in 2022. ? ? ?Prior Therapy: ? ?He is status post TURBT completed in August 2021 which confirmed the presence of muscle invasion. ? ?Neoadjuvant chemotherapy utilizing cisplatin and gemcitabine started on September 17, 2020.  He is status post 3 cycles of therapy completed in November 2021. ? ?He is status post robotic assisted laparoscopic cystoprostatectomy and ileal conduit formation and lymphadenectomy completed in February 2022.  The final pathology showed residual focus of urothelial carcinoma in situ without any lymph node involvement.  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 ? ?Current therapy: Active surveillance.  ? ? ? ?Interim History: [name] returns today for a follow-up visit.  Since the last visit, he reports feeling well without any major complaints.  He denies any nausea, vomiting or abdominal pain.  He denies any fevers or chills or sweats.  He had recent urinary tract infection which has resolved at this time. ? ? ? ? ? ?Medications: Updated on review. ?Current Outpatient Medications  ?Medication Sig Dispense Refill  ? acetaminophen (TYLENOL) 325 MG tablet Take 650 mg by mouth every 6 (six) hours as needed for mild pain, fever or headache.    ? allopurinol (ZYLOPRIM) 300 MG tablet Take 300 mg by mouth daily.    ? metoprolol tartrate (LOPRESSOR) 25 MG tablet Take 25 mg by mouth daily. Monitor BP before taking second dose SBP > 100    ? metoprolol tartrate (LOPRESSOR) 25 MG tablet Take 50 mg by mouth at bedtime. Monitor BP before taking second dose SBP > 100    ? metoprolol tartrate (LOPRESSOR) 50 MG tablet TAKE 1 TABLET BY MOUTH TWICE A DAY MONITOR BP BEFORE TAKING  2ND DOSE SBP >100 180 tablet 1  ? omeprazole (PRILOSEC) 20 MG capsule Take 20 mg by mouth every Monday, Wednesday, and Friday.     ? omeprazole (PRILOSEC) 20 MG capsule 1 capsule DAILY (route: oral)    ? polyethylene glycol (MIRALAX / GLYCOLAX) 17 g packet Take 17 g by mouth daily as needed for mild constipation. 14 each 1  ? prochlorperazine (COMPAZINE) 10 MG tablet Take 1 tablet (10 mg total) by mouth every 6 (six) hours as needed for nausea or vomiting. 30 tablet 0  ? simvastatin (ZOCOR) 40 MG tablet TAKE 1 TABLET BY MOUTH EVERYDAY AT BEDTIME 90 tablet 3  ? ?No current facility-administered medications for this visit.  ? ?Facility-Administered Medications Ordered in Other Visits  ?Medication Dose Route Frequency Provider Last Rate Last Admin  ? gemcitabine (GEMZAR) chemo syringe for bladder instillation 2,000 mg  2,000 mg Bladder Instillation Once Robley Fries, MD      ? ? ? ?Allergies:  ?Allergies  ?Allergen Reactions  ? Amiodarone   ?  Hallucinations ?  ? Oxycodone Other (See Comments)  ?  Bad dreams  ? ? ? ? ?Physical Exam: ?  ? ? ?Blood pressure 114/68, pulse 68, temperature 97.9 ?F (36.6 ?C), resp. rate 17, height '5\' 9"'$  (1.753 m), weight 179 lb 6.4 oz (81.4 kg), SpO2 98 %. ? ? ? ? ?ECOG: 1 ? ? ?General appearance: Alert, awake without any distress. ?Head: Atraumatic without abnormalities ?Oropharynx: Without any thrush or ulcers. ?Eyes: No scleral icterus. ?Lymph nodes:  No lymphadenopathy noted in the cervical, supraclavicular, or axillary nodes ?Heart:regular rate and rhythm, without any murmurs or gallops.   ?Lung: Clear to auscultation without any rhonchi, wheezes or dullness to percussion. ?Abdomin: Soft, nontender without any shifting dullness or ascites. ?Musculoskeletal: No clubbing or cyanosis. ?Neurological: No motor or sensory deficits. ?Skin: No rashes or lesions. ? ? ? ? ? ? ? ? ? ? ? ?Lab Results: ?Lab Results  ?Component Value Date  ? WBC 7.6 03/12/2022  ? HGB 11.4 (L) 03/12/2022  ? HCT  34.3 (L) 03/12/2022  ? MCV 90.7 03/12/2022  ? PLT 182 03/12/2022  ? ?  Chemistry   ?   ?Component Value Date/Time  ? NA 141 03/13/2021 0950  ? NA 142 08/24/2019 1415  ? K 3.4 (L) 03/13/2021 0950  ? CL 110 03/13/2021 0950  ? CO2 24 03/13/2021 0950  ? BUN 13 03/13/2021 0950  ? BUN 36 (H) 08/24/2019 1415  ? CREATININE 1.45 (H) 03/13/2021 0950  ? CREATININE 1.25 09/26/2014 0921  ?    ?Component Value Date/Time  ? CALCIUM 8.7 (L) 03/13/2021 0950  ? CALCIUM 8.6 12/03/2020 0935  ? ALKPHOS 88 03/13/2021 0950  ? AST 16 03/13/2021 0950  ? ALT 12 03/13/2021 0950  ? BILITOT 0.3 03/13/2021 0950  ?  ? ?IMPRESSION: ?1. Status post cystoprostatectomy and right lower quadrant ileal ?conduit urinary diversion. ?2. No evidence of recurrent or metastatic disease in the abdomen or ?pelvis. ?3. No evidence of urinary tract filling defect to the ileal conduit ?on delayed phase imaging. ?4. Nonobstructive left nephrolithiasis. ?5. Midline ventral hernia containing multiple loops of nonobstructed ?small bowel. Small parastomal hernia in the right lower quadrant ?containing distal ileal conduit. Small, fat containing bilateral ?inguinal hernias. ?6. Cholelithiasis. ?7. 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 Attention on follow-up. ? ?Aortic Atherosclerosis (ICD10-I70.0).  ? ?Impression and Plan: ? ?76 year old man with: ?  ?1.   Bladder cancer diagnosed in 2021.  He was found to have T2N0 high-grade urothelial carcinoma with a complete response to chemotherapy and surgical resection. ? ? ?His disease status was updated at this time.  He continues to be disease-free based on imaging studies completed in February 2023.  The risk of relapse remains low and salvage chemotherapy or immunotherapy will be used at that time if needed. ? ? ?2.  IV access: Port-A-Cath remains in place without any complications.  Risks and benefits of  Port-A-Cath removal were discussed at this time.  He opted to keep it and will be reasonable to keep flushing it every 2 months for the time being. ? ? 3.  Chronic renal insufficiency: No worsening renal failure noted at this time.  We will continue to monitor. ?  ?4.  Anemia: His hemoglobin continues to improve slowly and approaching normal range.  This is related to previous malignancy, chemotherapy and renal insufficiency. ? ?5.  Age-appropriate cancer screening: I recommended obtaining a colonoscopy given his family history of colon cancer. ? ?6.  Follow-up: Every 2 months for Port-A-Cath flush and MD follow-up in 6 months. ?  ?30  minutes were spent on this visit.  The time was dedicated to reviewing imaging studies, disease status update and outlining future plan of care discussion. ?  ? ?Zola Button, MD ?3/17/20238:29 AM ? ?

## 2022-04-09 ENCOUNTER — Telehealth: Payer: Self-pay | Admitting: Oncology

## 2022-04-09 NOTE — Telephone Encounter (Signed)
Per 4/14 phone lines, called pt to reschedule appointment.  Pt confirmed appointment   ?

## 2022-05-14 ENCOUNTER — Other Ambulatory Visit: Payer: Self-pay

## 2022-05-14 ENCOUNTER — Inpatient Hospital Stay: Payer: Medicare Other | Attending: Oncology

## 2022-05-14 DIAGNOSIS — Z8551 Personal history of malignant neoplasm of bladder: Secondary | ICD-10-CM | POA: Diagnosis present

## 2022-05-14 DIAGNOSIS — Z452 Encounter for adjustment and management of vascular access device: Secondary | ICD-10-CM | POA: Diagnosis present

## 2022-05-14 DIAGNOSIS — Z95828 Presence of other vascular implants and grafts: Secondary | ICD-10-CM

## 2022-05-14 MED ORDER — HEPARIN SOD (PORK) LOCK FLUSH 100 UNIT/ML IV SOLN
500.0000 [IU] | Freq: Once | INTRAVENOUS | Status: AC
  Administered 2022-05-14: 500 [IU] via INTRAVENOUS

## 2022-05-14 MED ORDER — SODIUM CHLORIDE 0.9% FLUSH
10.0000 mL | Freq: Once | INTRAVENOUS | Status: AC
Start: 2022-05-14 — End: 2022-05-14
  Administered 2022-05-14: 10 mL

## 2022-07-12 ENCOUNTER — Other Ambulatory Visit: Payer: Self-pay

## 2022-07-12 ENCOUNTER — Inpatient Hospital Stay: Payer: Medicare Other | Attending: Oncology

## 2022-07-12 DIAGNOSIS — Z8551 Personal history of malignant neoplasm of bladder: Secondary | ICD-10-CM | POA: Insufficient documentation

## 2022-07-12 DIAGNOSIS — Z95828 Presence of other vascular implants and grafts: Secondary | ICD-10-CM

## 2022-07-12 DIAGNOSIS — Z452 Encounter for adjustment and management of vascular access device: Secondary | ICD-10-CM | POA: Insufficient documentation

## 2022-07-12 MED ORDER — HEPARIN SOD (PORK) LOCK FLUSH 100 UNIT/ML IV SOLN
500.0000 [IU] | INTRAVENOUS | Status: AC | PRN
  Administered 2022-07-12: 500 [IU]

## 2022-07-12 MED ORDER — SODIUM CHLORIDE 0.9% FLUSH
10.0000 mL | Freq: Once | INTRAVENOUS | Status: AC
  Administered 2022-07-12: 10 mL

## 2022-08-09 ENCOUNTER — Other Ambulatory Visit (HOSPITAL_COMMUNITY): Payer: Self-pay | Admitting: Urology

## 2022-08-09 ENCOUNTER — Ambulatory Visit (HOSPITAL_COMMUNITY)
Admission: RE | Admit: 2022-08-09 | Discharge: 2022-08-09 | Disposition: A | Discharge status: Home / Self Care | Payer: Medicare Other | Source: Ambulatory Visit | Attending: Urology | Admitting: Urology

## 2022-08-09 DIAGNOSIS — C673 Malignant neoplasm of anterior wall of bladder: Secondary | ICD-10-CM | POA: Insufficient documentation

## 2022-09-10 ENCOUNTER — Inpatient Hospital Stay: Payer: Medicare Other | Attending: Oncology

## 2022-09-10 ENCOUNTER — Other Ambulatory Visit: Payer: Self-pay

## 2022-09-10 ENCOUNTER — Inpatient Hospital Stay (HOSPITAL_BASED_OUTPATIENT_CLINIC_OR_DEPARTMENT_OTHER): Payer: Medicare Other | Admitting: Oncology

## 2022-09-10 VITALS — BP 130/70 | HR 65 | Temp 97.6°F | Resp 17 | Ht 69.0 in | Wt 178.8 lb

## 2022-09-10 DIAGNOSIS — D630 Anemia in neoplastic disease: Secondary | ICD-10-CM | POA: Insufficient documentation

## 2022-09-10 DIAGNOSIS — Z8551 Personal history of malignant neoplasm of bladder: Secondary | ICD-10-CM | POA: Diagnosis present

## 2022-09-10 DIAGNOSIS — N189 Chronic kidney disease, unspecified: Secondary | ICD-10-CM | POA: Insufficient documentation

## 2022-09-10 DIAGNOSIS — C679 Malignant neoplasm of bladder, unspecified: Secondary | ICD-10-CM | POA: Diagnosis not present

## 2022-09-10 DIAGNOSIS — D6481 Anemia due to antineoplastic chemotherapy: Secondary | ICD-10-CM | POA: Diagnosis not present

## 2022-09-10 DIAGNOSIS — Z95828 Presence of other vascular implants and grafts: Secondary | ICD-10-CM

## 2022-09-10 DIAGNOSIS — T451X5A Adverse effect of antineoplastic and immunosuppressive drugs, initial encounter: Secondary | ICD-10-CM | POA: Diagnosis not present

## 2022-09-10 LAB — CBC WITH DIFFERENTIAL (CANCER CENTER ONLY)
Abs Immature Granulocytes: 0.02 10*3/uL (ref 0.00–0.07)
Basophils Absolute: 0 10*3/uL (ref 0.0–0.1)
Basophils Relative: 0 %
Eosinophils Absolute: 0.4 10*3/uL (ref 0.0–0.5)
Eosinophils Relative: 5 %
HCT: 36.9 % — ABNORMAL LOW (ref 39.0–52.0)
Hemoglobin: 12.2 g/dL — ABNORMAL LOW (ref 13.0–17.0)
Immature Granulocytes: 0 %
Lymphocytes Relative: 16 %
Lymphs Abs: 1.3 10*3/uL (ref 0.7–4.0)
MCH: 31.1 pg (ref 26.0–34.0)
MCHC: 33.1 g/dL (ref 30.0–36.0)
MCV: 94.1 fL (ref 80.0–100.0)
Monocytes Absolute: 0.6 10*3/uL (ref 0.1–1.0)
Monocytes Relative: 7 %
Neutro Abs: 5.6 10*3/uL (ref 1.7–7.7)
Neutrophils Relative %: 72 %
Platelet Count: 137 10*3/uL — ABNORMAL LOW (ref 150–400)
RBC: 3.92 MIL/uL — ABNORMAL LOW (ref 4.22–5.81)
RDW: 14.6 % (ref 11.5–15.5)
WBC Count: 7.8 10*3/uL (ref 4.0–10.5)
nRBC: 0 % (ref 0.0–0.2)

## 2022-09-10 MED ORDER — SODIUM CHLORIDE 0.9% FLUSH
10.0000 mL | Freq: Once | INTRAVENOUS | Status: AC
  Administered 2022-09-10: 10 mL

## 2022-09-10 NOTE — Progress Notes (Signed)
Hematology and Oncology Follow Up Visit  Hunter [name] [id number] October 17, 1946 76 y.o. 09/10/2022 2:44 PM Hunter [name], Hunter Grist, FNP   Principle Diagnosis: 76 year old man with bladder cancer diagnosed in 2021.  He was found to have T2N0 high-grade urothelial carcinoma without any evidence of relapsed disease after surgical resection.   Prior Therapy:  He is status post TURBT completed in August 2021 which confirmed the presence of muscle invasion.  Neoadjuvant chemotherapy utilizing cisplatin and gemcitabine started on September 17, 2020.  He is status post 3 cycles of therapy completed in November 2021.  He is status post robotic assisted laparoscopic cystoprostatectomy and ileal conduit formation and lymphadenectomy completed in February 2022.  The final pathology showed residual focus of urothelial carcinoma in situ without any lymph node involvement.  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  Current therapy: Active surveillance.     Interim History: [name] is here for a follow-up visit.  Since the last visit, he reports feeling well without any major complaints.  He denies any nausea, vomiting or abdominal pain.  He denies any hospitalizations or illnesses.  His performance status quality of life remains unchanged.     Medications: Reviewed without changes. Current Outpatient Medications  Medication Sig Dispense Refill   acetaminophen (TYLENOL) 325 MG tablet Take 650 mg by mouth every 6 (six) hours as needed for mild pain, fever or headache.     allopurinol (ZYLOPRIM) 300 MG tablet Take 300 mg by mouth daily.     metoprolol tartrate (LOPRESSOR) 25 MG tablet Take 25 mg by mouth daily. Monitor BP before taking second dose SBP > 100     metoprolol tartrate (LOPRESSOR) 25 MG tablet Take 50 mg by mouth at bedtime. Monitor BP before taking second dose SBP > 100     metoprolol tartrate (LOPRESSOR) 50 MG tablet TAKE 1 TABLET BY MOUTH TWICE A DAY MONITOR  BP BEFORE TAKING 2ND DOSE SBP >100 180 tablet 1   omeprazole (PRILOSEC) 20 MG capsule Take 20 mg by mouth every Monday, Wednesday, and Friday.      omeprazole (PRILOSEC) 20 MG capsule 1 capsule DAILY (route: oral)     polyethylene glycol (MIRALAX / GLYCOLAX) 17 g packet Take 17 g by mouth daily as needed for mild constipation. 14 each 1   prochlorperazine (COMPAZINE) 10 MG tablet Take 1 tablet (10 mg total) by mouth every 6 (six) hours as needed for nausea or vomiting. 30 tablet 0   simvastatin (ZOCOR) 40 MG tablet TAKE 1 TABLET BY MOUTH EVERYDAY AT BEDTIME 90 tablet 3   No current facility-administered medications for this visit.   Facility-Administered Medications Ordered in Other Visits  Medication Dose Route Frequency Provider Last Rate Last Admin   gemcitabine (GEMZAR) chemo syringe for bladder instillation 2,000 mg  2,000 mg Bladder Instillation Once Hunter Fries, MD         Allergies:  Allergies  Allergen Reactions   Amiodarone     Hallucinations    Oxycodone Other (See Comments)    Bad dreams      Physical Exam:     Blood pressure 130/70, pulse 65, temperature 97.6 F (36.4 C), temperature source Tympanic, resp. rate 17, height '5\' 9"'$  (1.753 m), weight 178 lb 12.8 oz (81.1 kg), SpO2 97 %.      ECOG: 1    General appearance: Comfortable appearing without any discomfort Head: Normocephalic without any trauma Oropharynx: Mucous membranes are moist and pink without any thrush  or ulcers. Eyes: Pupils are equal and round reactive to light. Lymph nodes: No cervical, supraclavicular, inguinal or axillary lymphadenopathy.   Heart:regular rate and rhythm.  S1 and S2 without leg edema. Lung: Clear without any rhonchi or wheezes.  No dullness to percussion. Abdomin: Soft, nontender, nondistended with good bowel sounds.  No hepatosplenomegaly. Musculoskeletal: No joint deformity or effusion.  Full range of motion noted. Neurological: No deficits noted on motor,  sensory and deep tendon reflex exam. Skin: No petechial rash or dryness.  Appeared moist.             Lab Results: Lab Results  Component Value Date   WBC 7.6 03/12/2022   HGB 11.4 (L) 03/12/2022   HCT 34.3 (L) 03/12/2022   MCV 90.7 03/12/2022   PLT 182 03/12/2022     Chemistry      Component Value Date/Time   NA 141 03/13/2021 0950   NA 142 08/24/2019 1415   K 3.4 (L) 03/13/2021 0950   CL 110 03/13/2021 0950   CO2 24 03/13/2021 0950   BUN 13 03/13/2021 0950   BUN 36 (H) 08/24/2019 1415   CREATININE 1.45 (H) 03/13/2021 0950   CREATININE 1.25 09/26/2014 0921      Component Value Date/Time   CALCIUM 8.7 (L) 03/13/2021 0950   CALCIUM 8.6 12/03/2020 0935   ALKPHOS 88 03/13/2021 0950   AST 16 03/13/2021 0950   ALT 12 03/13/2021 0950   BILITOT 0.3 03/13/2021 0950      Impression and Plan:  76 year old man with:   1.   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     He continues to be on active surveillance without any evidence of relapsed disease.  Imaging studies obtained in August 2023 continues to show evidence of remission.  Salvage therapy options including immunotherapy as well as oral targeted therapy will be deferred at this time.  He is agreeable to continue with active surveillance.   2.  IV access: Risks and benefits of a Port-A-Cath removal will be discussed at this time.  He prefers to keep it and continue to flush every 2 months.   3.  Chronic renal insufficiency: His kidney function remains stable at this time.   4.  Anemia: Due to his malignancy and chemotherapy.  His hemoglobin today is 12.2 and approaching normal range.   5.  Follow-up: In 2 months and 4 months for Port-A-Cath flush and MD follow-up in 6 months.   30  minutes were dedicated to this encounter.  The time was spent on reviewing laboratory data, disease status update and outlining future plan of care discussion.    Hunter Button, MD 9/15/20232:44  PM

## 2022-11-15 ENCOUNTER — Other Ambulatory Visit: Payer: Self-pay

## 2022-11-15 ENCOUNTER — Inpatient Hospital Stay: Payer: Medicare Other | Attending: Oncology

## 2022-11-15 DIAGNOSIS — Z95828 Presence of other vascular implants and grafts: Secondary | ICD-10-CM

## 2022-11-15 DIAGNOSIS — C673 Malignant neoplasm of anterior wall of bladder: Secondary | ICD-10-CM

## 2022-11-15 DIAGNOSIS — Z8551 Personal history of malignant neoplasm of bladder: Secondary | ICD-10-CM | POA: Diagnosis present

## 2022-11-15 DIAGNOSIS — Z452 Encounter for adjustment and management of vascular access device: Secondary | ICD-10-CM | POA: Insufficient documentation

## 2022-11-15 MED ORDER — HEPARIN SOD (PORK) LOCK FLUSH 100 UNIT/ML IV SOLN
500.0000 [IU] | Freq: Once | INTRAVENOUS | Status: AC
  Administered 2022-11-15: 500 [IU]

## 2022-11-15 MED ORDER — SODIUM CHLORIDE 0.9% FLUSH
10.0000 mL | Freq: Once | INTRAVENOUS | Status: AC
  Administered 2022-11-15: 10 mL

## 2022-12-27 DIAGNOSIS — Z9181 History of falling: Secondary | ICD-10-CM | POA: Insufficient documentation

## 2023-01-11 ENCOUNTER — Telehealth: Payer: Self-pay | Admitting: Oncology

## 2023-01-11 NOTE — Telephone Encounter (Signed)
Called patient regarding provider departure, spoke with patient's spouse. Patient will be notified. Patient will be rescheduled with new provider.

## 2023-01-17 ENCOUNTER — Inpatient Hospital Stay: Payer: Medicare Other | Attending: Oncology

## 2023-01-17 ENCOUNTER — Other Ambulatory Visit: Payer: Self-pay | Admitting: *Deleted

## 2023-01-17 ENCOUNTER — Other Ambulatory Visit: Payer: Self-pay

## 2023-01-17 ENCOUNTER — Telehealth: Payer: Self-pay | Admitting: *Deleted

## 2023-01-17 ENCOUNTER — Other Ambulatory Visit (HOSPITAL_COMMUNITY): Payer: Self-pay | Admitting: *Deleted

## 2023-01-17 DIAGNOSIS — C673 Malignant neoplasm of anterior wall of bladder: Secondary | ICD-10-CM

## 2023-01-17 DIAGNOSIS — Z8551 Personal history of malignant neoplasm of bladder: Secondary | ICD-10-CM | POA: Diagnosis not present

## 2023-01-17 DIAGNOSIS — Z95828 Presence of other vascular implants and grafts: Secondary | ICD-10-CM

## 2023-01-17 DIAGNOSIS — Z8546 Personal history of malignant neoplasm of prostate: Secondary | ICD-10-CM | POA: Insufficient documentation

## 2023-01-17 LAB — CMP (CANCER CENTER ONLY)
ALT: 21 U/L (ref 0–44)
AST: 22 U/L (ref 15–41)
Albumin: 3.7 g/dL (ref 3.5–5.0)
Alkaline Phosphatase: 94 U/L (ref 38–126)
Anion gap: 6 (ref 5–15)
BUN: 28 mg/dL — ABNORMAL HIGH (ref 8–23)
CO2: 23 mmol/L (ref 22–32)
Calcium: 9 mg/dL (ref 8.9–10.3)
Chloride: 108 mmol/L (ref 98–111)
Creatinine: 1.76 mg/dL — ABNORMAL HIGH (ref 0.61–1.24)
GFR, Estimated: 40 mL/min — ABNORMAL LOW (ref >60)
Glucose, Bld: 93 mg/dL (ref 70–99)
Potassium: 3.8 mmol/L (ref 3.5–5.1)
Sodium: 137 mmol/L (ref 135–145)
Total Bilirubin: 0.5 mg/dL (ref 0.3–1.2)
Total Protein: 6.3 g/dL — ABNORMAL LOW (ref 6.5–8.1)

## 2023-01-17 MED ORDER — SODIUM CHLORIDE 0.9% FLUSH
10.0000 mL | Freq: Once | INTRAVENOUS | Status: AC
  Administered 2023-01-17: 10 mL

## 2023-01-17 MED ORDER — HEPARIN SOD (PORK) LOCK FLUSH 100 UNIT/ML IV SOLN
500.0000 [IU] | Freq: Once | INTRAVENOUS | Status: AC
  Administered 2023-01-17: 500 [IU]

## 2023-01-17 NOTE — Telephone Encounter (Signed)
Patient arrived here for port flush, has order in hand from Dr Tresa Moore at [facility] for Hunter [name] & PSA.  Patient is requesting labs be drawn here using his port, because he will have peripheral stick if labs are drawn at [facility].  Patient is previous patient of Dr Alen Blew, will be seeing Dr Delton Coombes in March.  OK for labs to be drawn here, orders received from T. Doyle Askew RN/Dr Buckner.

## 2023-01-18 LAB — PROSTATE-SPECIFIC AG, SERUM (LABCORP): Prostate Specific Ag, Serum: <0.1 ng/mL (ref 0.0–4.0)

## 2023-01-31 ENCOUNTER — Other Ambulatory Visit: Payer: Self-pay | Admitting: Oncology

## 2023-02-13 ENCOUNTER — Other Ambulatory Visit: Payer: Self-pay | Admitting: Cardiovascular Disease

## 2023-02-27 NOTE — Progress Notes (Signed)
[facility] [address], [place] [address]    Clinic Day:  02/28/2023  Referring physician: Thea Alken, FNP  Patient Care Team: Hunter [name] as PCP - General (Nurse Practitioner) Hunter Klein, MD as PCP - Cardiology (Cardiology) Hunter Klein, MD as Attending Physician (Cardiology) Hunter [name], Hunter Goody, FNP as [place] Nurse (Nurse Practitioner) Hunter Jack, MD as Medical Oncologist (Medical Oncology)   ASSESSMENT & PLAN:   Assessment:  1.  T2 N0 high-grade urothelial bladder cancer: - TURBT in August 2021 - Neoadjuvant chemotherapy with cisplatin and gemcitabine from 09/17/2020 through November 2021, 3 cycles - Radical cystoprostatectomy and ileal conduit formation and lymph node dissection on 02/04/2021.  Pathology Y pTis ypN0  2.  Social/family history: - He lives at home with his wife.  He plays golf twice weekly.  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  Non-smoker. - Mother had colon cancer in her 24s.  3.  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  All margins of resection are negative for carcinoma.  Plan:  1.  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 - He does not have any clinical signs or symptoms of recurrence at this time. - He recently had an MRI of his abdomen and then will by Dr. Megan [name] which did not show any evidence of recurrence.  He was also seen by Dr. Tresa [name] a month ago. - I have reviewed his recent CMP which showed creatinine elevated at 1.76.  LFTs were normal. - I have recommended that he follow-up in 6 months with repeat labs.  2.  Prostate cancer: - PSA on 01/17/2019 full undetectable.  We will repeat PSA at next visit.  3.  Chronic renal insufficiency: - Creatinine is elevated at 1.76 and stable.  4.  Normocytic anemia: - He has mild  normocytic anemia likely from CKD.  Will check ferritin and iron panel at next visit.  5.  Port-A-Cath: - Continue port flushes every 3 months.  Orders Placed This Encounter  Procedures   CBC with Differential    Standing Status:   Future    Standing Expiration Date:   02/28/2024   Comprehensive metabolic panel    Standing Status:   Future    Standing Expiration Date:   02/28/2024   PSA    Standing Status:   Future    Standing Expiration Date:   02/28/2024   CBC with Differential    Standing Status:   Future    Standing Expiration Date:   02/28/2024   Comprehensive metabolic panel    Standing Status:   Future    Standing Expiration Date:   02/28/2024   PSA    Standing Status:   Future    Standing Expiration Date:   02/28/2024   Ferritin    Standing Status:   Future    Standing Expiration Date:   02/28/2024   Iron and TIBC ([facility] ONLY)    Standing Status:   Future    Standing Expiration Date:   02/28/2024      I,Hunter [name],acting as a Education administrator for [employer], MD.,have documented all relevant documentation on the behalf of Hunter Jack, MD,as directed by  Hunter Jack, MD while in the presence of Hunter Jack, MD.   I, Hunter Jack MD, have reviewed the above documentation for accuracy and completeness, and  I agree with the above.   Hunter Jack, MD   3/4/20247:02 PM  CHIEF COMPLAINT:   Diagnosis:  bladder cancer diagnosed in 2021; T2N0 high-grade urothelial carcinoma without any evidence of relapsed disease after surgical resection   Cancer Staging  No matching staging information was found for the patient.   Prior Therapy:  Status post TURBT completed in August 2021  Neoadjuvant chemotherapy utilizing cisplatin and gemcitabine started on September 17, 2020. He is status post 3 cycles of therapy completed in November 2021.  Robotic assisted laparoscopic cystoprostatectomy and ileal conduit formation and  lymphadenectomy completed in February 2022- The final pathology showed residual focus of urothelial carcinoma in situ without any lymph node involvement. 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   Current Therapy:  Surveillance   HISTORY OF PRESENT ILLNESS:   Oncology History  Malignant neoplasm of urinary bladder ([facility])  09/03/2020 Initial Diagnosis   Malignant neoplasm of urinary bladder ([facility])   09/17/2020 - 11/05/2020 Chemotherapy   The patient had palonosetron (ALOXI) injection 0.25 mg, 0.25 mg, Intravenous,  Once, 3 of 3 cycles Administration: 0.25 mg (09/17/2020), 0.25 mg (10/07/2020), 0.25 mg (10/29/2020) CISplatin (PLATINOL) 100 mg in sodium chloride 0.9 % 250 mL chemo infusion, 49.5 mg/m2 = 101 mg (100 % of original dose 50 mg/m2), Intravenous,  Once, 3 of 3 cycles Dose modification: 50 mg/m2 (original dose 50 mg/m2, Cycle 1, Reason: Provider Judgment) Administration: 100 mg (09/17/2020), 100 mg (10/29/2020) gemcitabine (GEMZAR) 2,000 mg in sodium chloride 0.9 % 250 mL chemo infusion, 2,014 mg (100 % of original dose 1,000 mg/m2), Intravenous,  Once, 3 of 3 cycles Dose modification: 1,000 mg/m2 (original dose 1,000 mg/m2, Cycle 1, Reason: Provider Judgment), 800 mg/m2 (original dose 1,000 mg/m2, Cycle 2, Reason: Provider Judgment) Administration: 2,000 mg (09/17/2020), 2,000 mg (09/23/2020), 1,634 mg (10/07/2020), 1,634 mg (10/15/2020), 1,634 mg (10/29/2020), 1,634 mg (11/05/2020) fosaprepitant (EMEND) 150 mg in sodium chloride 0.9 % 145 mL IVPB, 150 mg, Intravenous,  Once, 3 of 3 cycles Administration: 150 mg (09/17/2020), 150 mg (10/07/2020), 150 mg (10/29/2020)  for chemotherapy treatment.       INTERVAL HISTORY:   Hunter [name] is a 77 y.o. male presenting to clinic today for follow up of bladder cancer. He presents today as a transfer of care from Dr. Zola [name] who he last saw on 09/10/2022.  Today, he states that he is doing well overall. His appetite level is at 100%. His  energy level is at 100%.  He reports being active and playing golf twice weekly.  Prior to his bladder cancer diagnosis, he used to play 5 times a week.  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  He has seen Dr. Tresa [name] about a month ago.  He reportedly had MRI of his abdomen/back in October 2023 at [facility] ordered by Dr. Fredderick Phenix.  Apparently the MRI did not show any evidence of metastatic disease.   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 Intol amio, was on Sotalol, now on BB   Bladder cancer ([facility]) dx'd 10/2019   Bursitis of right shoulder    CAD (coronary artery disease) 11/2012   CABG x 4 using left internal mammary artery and right endovein harvest. (LIMA-LAD; VG-OM1, OM2, VG-PD), EF 55-60% at cath   Degenerative disc disease, lumbar    Epigastric pain  Myoview 11/09/12 Showed evidence of reversible ischemia in both the inferior wall & the anteroapical distribution.    Erectile dysfunction    H/O ED while taking a beta-blocker   GERD (gastroesophageal reflux disease)    Gout    Hiatal hernia    Hyperlipidemia    Hypertension    Systemic HTN. Pt has H/O of ED when taking a beta-blocker. ECHO 11/26/08 Showed no evidence of any significant valvular disease, Estimated EF = 50-60%.   Kidney stone    " i have  6-7 stones imbedded in my left kidney "    Mononeuritis of unspecified site    Nocturia    NSVT (nonsustained ventricular tachycardia) ([facility])    Obesity    Osteoarthritis 08/2019   right knee; cortisone shot    PAF (paroxysmal atrial fibrillation) ([facility])    Post CABG. [place]ia intol. Was on Sotalol, now on Lopressor   Polio    Age 52. 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 (LIMA-LAD; VG-OM1, OM2, VG-PD)   CYSTOSCOPY WITH INJECTION N/A 02/04/2021   Procedure: CYSTOSCOPY WITH INJECTION OF INDOCYANINE GREEN DYE;  Surgeon: MAlexis Frock MD;  Location: [facility];  Service: Urology;  Laterality: N/A;   EYE SURGERY     had lasik surgery bilaterally 1996   IR IMAGING GUIDED PORT INSERTION  09/10/2020   LEFT HEART CATHETERIZATION WITH CORONARY ANGIOGRAM N/A 12/08/2012   Procedure: LEFT HEART CATHETERIZATION WITH CORONARY ANGIOGRAM;  Surgeon: MSanda Klein MD;  [facility];  Service: Cardiovascular;  Laterality: N/A;   LYMPHADENECTOMY Bilateral 02/04/2021   Procedure: LYMPHADENECTOMY;  Surgeon: MAlexis Frock MD;  Location: [facility];  Service: Urology;  Laterality: Bilateral;   ROBOT ASSISTED LAPAROSCOPIC COMPLETE CYSTECT ILEAL CONDUIT N/A 02/04/2021   Procedure: XI ROBOTIC ASSISTED LAPAROSCOPIC COMPLETE CYSTOPROSTATECTOMY ILEAL CONDUIT;  Surgeon: MAlexis Frock MD;  Location: [facility];  Service: Urology;  Laterality: N/A;  6 HRS   SHOULDER SURGERY     for effects of polio   SKIN CANCER EXCISION  2016   left side of face    TRANSURETHRAL RESECTION OF BLADDER TUMOR N/A 11/16/2019   Procedure: TRANSURETHRAL RESECTION OF BLADDER TUMOR (TURBT) WITH INSTILLATION OF POST OPERATIVE CHEMOTHERAPY;  Surgeon: OKathie Rhodes MD;  Location: [facility];  Service: Urology;  Laterality: N/A;   TRANSURETHRAL RESECTION OF BLADDER TUMOR WITH MITOMYCIN-C N/A 08/12/2020   Procedure: TRANSURETHRAL RESECTION OF BLADDER TUMOR WITH GEMCITABINE;  Surgeon: PRobley Fries MD;  Location: [facility];  Service: Urology;  Laterality: N/A;  1 HR   UPPER GI ENDOSCOPY      Social  History: Social History   Socioeconomic History   Marital status: Married    Spouse name: Not on file   Number of children: 3   Years of education: Not on file   Highest education level: Not on file  Occupational History   Occupation: THigher education careers adviser(RETIRED)    Employer: [employer]  Tobacco Use   Smoking  status: Never   Smokeless tobacco: Never  Vaping Use   Vaping Use: Never used  Substance and Sexual Activity   Alcohol use: Yes    Comment: social, very little amount, tequila   Drug use: No   Sexual activity: Not on file  Other Topics Concern   Not on file  Social History Narrative   Not on file   Social Determinants of Health   Financial Resource Strain: Not on file  Food Insecurity: No Food Insecurity (02/28/2023)   Hunger Vital Sign    Worried About Running Out of Food in the Last Year: Never true    Ran Out of Food in the Last Year: Never true  Transportation Needs: No Transportation Needs (02/28/2023)   PRAPARE - Hydrologist (Medical): No    Lack of Transportation (Non-Medical): No  Physical Activity: Not on file  Stress: Not on file  Social Connections: Not on file  Intimate Partner Violence: Not At Risk (02/28/2023)   Humiliation, Afraid, Rape, and Kick questionnaire    Fear of Current or Ex-Partner: No    Emotionally Abused: No    Physically Abused: No    Sexually Abused: No    Family History: Family History  Problem Relation Age of Onset   CAD Father    Hypertension Father    AAA (abdominal aortic aneurysm) Father    Aneurysm Father        Cause of death   Heart attack Paternal Grandfather    Cancer Mother        Colon. Cause of death    Current Medications:  Current Outpatient Medications:    allopurinol (ZYLOPRIM) 300 MG tablet, Take 300 mg by mouth daily., Disp: , Rfl:    metoprolol tartrate (LOPRESSOR) 50 MG tablet, TAKE 1 TABLET BY MOUTH TWICE A DAY MONITOR BP BEFORE TAKING 2ND DOSE SBP >100, Disp: 60 tablet,  Rfl: 0   simvastatin (ZOCOR) 40 MG tablet, TAKE 1 TABLET BY MOUTH EVERYDAY AT BEDTIME, Disp: 90 tablet, Rfl: 3   Allergies: Allergies  Allergen Reactions   Amiodarone     Hallucinations    Oxycodone Other (See Comments)    Bad dreams  Other Reaction(s): hallucinations, Not available    REVIEW OF SYSTEMS:   Review of Systems  Constitutional:  Negative for chills, fatigue and fever.  HENT:   Negative for lump/mass, mouth sores, nosebleeds, sore throat and trouble swallowing.   Eyes:  Negative for eye problems.  Respiratory:  Negative for cough and shortness of breath.   Cardiovascular:  Negative for chest pain, leg swelling and palpitations.  Gastrointestinal:  Negative for abdominal pain, constipation, diarrhea, nausea and vomiting.  Genitourinary:  Negative for bladder incontinence, difficulty urinating, dysuria, frequency, hematuria and nocturia.   Musculoskeletal:  Negative for arthralgias, back pain, flank pain, myalgias and neck pain.  Skin:  Negative for itching and rash.  Neurological:  Negative for dizziness, headaches and numbness.  Hematological:  Does not bruise/bleed easily.  Psychiatric/Behavioral:  Negative for depression, sleep disturbance and suicidal ideas. The patient is not nervous/anxious.   All other systems reviewed and are negative.    VITALS:   Blood pressure 122/84, pulse (!) 57, temperature 97.7 F (36.5 C), temperature source Oral, resp. rate 16, height '5\' 9"'$  (1.753 m), weight 187 lb 11.2 oz (85.1 kg).  Wt Readings from Last 3 Encounters:  02/28/23 187 lb 11.2 oz (85.1 kg)  09/10/22 178 lb 12.8 oz (81.1 kg)  03/12/22 179  lb 6.4 oz (81.4 kg)    Body mass index is 27.72 kg/m.  Performance status (ECOG): 1 - Symptomatic but completely ambulatory  PHYSICAL EXAM:   Physical Exam Vitals and nursing note reviewed. Exam conducted with a chaperone present.  Constitutional:      Appearance: Normal appearance.  Cardiovascular:     Rate and Rhythm:  Normal rate and regular rhythm.     Pulses: Normal pulses.     Heart sounds: Normal heart sounds.  Pulmonary:     Effort: Pulmonary effort is normal.     Breath sounds: Normal breath sounds.  Abdominal:     Palpations: Abdomen is soft. There is no hepatomegaly, splenomegaly or mass.     Tenderness: There is no abdominal tenderness.  Musculoskeletal:     Right lower leg: No edema.     Left lower leg: No edema.  Lymphadenopathy:     Cervical: No cervical adenopathy.     Right cervical: No superficial, deep or posterior cervical adenopathy.    Left cervical: No superficial, deep or posterior cervical adenopathy.     Upper Body:     Right upper body: No supraclavicular or axillary adenopathy.     Left upper body: No supraclavicular or axillary adenopathy.  Neurological:     General: No focal deficit present.     Mental Status: He is alert and oriented to person, place, and time.  Psychiatric:        Mood and Affect: Mood normal.        Behavior: Behavior normal.     LABS:      Latest Ref Rng & Units 09/10/2022    2:29 PM 03/12/2022    8:03 AM 09/11/2021   10:00 AM  CBC  WBC 4.0 - 10.5 K/uL 7.8  7.6  5.2   Hemoglobin 13.0 - 17.0 g/dL 12.2  11.4  11.2   Hematocrit 39.0 - 52.0 % 36.9  34.3  33.3   Platelets 150 - 400 K/uL 137  182  152       Latest Ref Rng & Units 01/17/2023   11:53 AM 03/13/2021    9:50 AM 02/09/2021    3:30 AM  CMP  Glucose 70 - 99 mg/dL 93  111  105   BUN 8 - 23 mg/dL '28  13  12   '$ Creatinine 0.61 - 1.24 mg/dL 1.76  1.45  1.23   Sodium 135 - 145 mmol/L 137  141  139   Potassium 3.5 - 5.1 mmol/L 3.8  3.4  3.0   Chloride 98 - 111 mmol/L 108  110  109   CO2 22 - 32 mmol/L '23  24  22   '$ Calcium 8.9 - 10.3 mg/dL 9.0  8.7  8.1   Total Protein 6.5 - 8.1 g/dL 6.3  6.2    Total Bilirubin 0.3 - 1.2 mg/dL 0.5  0.3    Alkaline Phos 38 - 126 U/L 94  88    AST 15 - 41 U/L 22  16    ALT 0 - 44 U/L 21  12       No results found for: "CEA1", "CEA" / No results found  for: "CEA1", "CEA" Lab Results  Component Value Date   PSA1 <0.1 01/17/2023   No results found for: "[id number]" No results found for: "CAN125"  No results found for: "TOTALPROTELP", "ALBUMINELP", "A1GS", "A2GS", "BETS", "BETA2SER", "GAMS", "MSPIKE", "SPEI" Lab Results  Component Value Date   TIBC 186 (L) 11/12/2020  FERRITIN >1,500 (H) 11/12/2020   IRONPCTSAT 44 (H) 11/12/2020   No results found for: "LDH"   STUDIES:   No results found.

## 2023-02-28 ENCOUNTER — Inpatient Hospital Stay: Payer: Medicare Other | Attending: Oncology | Admitting: Hematology

## 2023-02-28 ENCOUNTER — Encounter: Payer: Self-pay | Admitting: Hematology

## 2023-02-28 ENCOUNTER — Inpatient Hospital Stay: Payer: Medicare Other

## 2023-02-28 VITALS — BP 122/84 | HR 57 | Temp 97.7°F | Resp 16 | Ht 69.0 in | Wt 187.7 lb

## 2023-02-28 DIAGNOSIS — C679 Malignant neoplasm of bladder, unspecified: Secondary | ICD-10-CM

## 2023-02-28 DIAGNOSIS — N189 Chronic kidney disease, unspecified: Secondary | ICD-10-CM | POA: Diagnosis not present

## 2023-02-28 DIAGNOSIS — C673 Malignant neoplasm of anterior wall of bladder: Secondary | ICD-10-CM

## 2023-02-28 DIAGNOSIS — D649 Anemia, unspecified: Secondary | ICD-10-CM

## 2023-02-28 DIAGNOSIS — C61 Malignant neoplasm of prostate: Secondary | ICD-10-CM | POA: Diagnosis not present

## 2023-02-28 DIAGNOSIS — C675 Malignant neoplasm of bladder neck: Secondary | ICD-10-CM

## 2023-02-28 MED ORDER — HEPARIN SOD (PORK) LOCK FLUSH 100 UNIT/ML IV SOLN
500.0000 [IU] | Freq: Once | INTRAVENOUS | Status: AC
  Administered 2023-02-28: 500 [IU] via INTRAVENOUS

## 2023-02-28 NOTE — Patient Instructions (Addendum)
[facility]  Discharge Instructions  You were seen and examined today by Dr. Delton Coombes. Dr. Delton Coombes is a medical oncologist, meaning that he specializes in the treatment of cancer diagnoses. Dr. Delton Coombes discussed your past medical history, family history of cancers, and the events that led to you being here today.  You were referred to Dr. Delton Coombes for ongoing management of your bladder cancer.  Dr. Delton Coombes has reviewed your most recent labs, and your PSA is undetectable. Your other labs are stable. This is great!  Your kidney function has worsened, increase your water intake. You are slightly anemic. Please take one over-the-counter Ferrous Sulfate (iron) '325mg'$  once daily. If constipation becomes an issue, decrease it to 3 days a week.  We will arrange for you to have your Port-A-Cath flushed every 3 months.  Follow-up as scheduled with Dr. Delton Coombes.   You will have labs prior to your next visit with Dr. Delton Coombes.  Thank you for choosing [facility] to provide your oncology and hematology care.   To afford each patient quality time with our provider, please arrive at least 15 minutes before your scheduled appointment time. You may need to reschedule your appointment if you arrive late (10 or more minutes). Arriving late affects you and other patients whose appointments are after yours.  Also, if you miss three or more appointments without notifying the office, you may be dismissed from the clinic at the provider's discretion.    Again, thank you for choosing [facility].  Our hope is that these requests will decrease the amount of time that you wait before being seen by our physicians.   If you have a lab appointment with the [facility] please come in thru the Main Entrance and check in at the main information desk.           _____________________________________________________________  Should you  have questions after your visit to [facility], please contact our office at [phone] and follow the prompts.  Our office hours are 8:00 a.m. to 4:30 p.m. Monday - Thursday and 8:00 a.m. to 2:30 p.m. Friday.  Please note that voicemails left after 4:00 p.m. may not be returned until the following business day.  We are closed weekends and all major holidays.  You do have access to a nurse 24-7, just call the main number to the clinic [phone] and do not press any options, hold on the line and a nurse will answer the phone.    For prescription refill requests, have your pharmacy contact our office and allow 72 hours.    Masks are optional in the cancer centers. If you would like for your care team to wear a mask while they are taking care of you, please let them know. You may have one support person who is at least 77 years old accompany you for your appointments.

## 2023-02-28 NOTE — Progress Notes (Signed)
Port accessed using sterile technique.  Accessed with 20 G needle.  Blood return noted, however was unable to collect samples.  Site CDI.  Patient tolerated well and was flushed with 500 units heparin flush per protocol.  Patient discharged ambulatory in stable condition.

## 2023-03-08 ENCOUNTER — Other Ambulatory Visit: Payer: Self-pay | Admitting: Cardiovascular Disease

## 2023-03-11 ENCOUNTER — Ambulatory Visit: Payer: Medicare Other | Admitting: Oncology

## 2023-03-11 ENCOUNTER — Other Ambulatory Visit: Payer: Medicare Other

## 2023-03-16 ENCOUNTER — Other Ambulatory Visit: Payer: Self-pay | Admitting: Cardiovascular Disease

## 2023-04-01 ENCOUNTER — Other Ambulatory Visit: Payer: Self-pay | Admitting: Neurological Surgery

## 2023-04-08 ENCOUNTER — Other Ambulatory Visit: Payer: Self-pay

## 2023-04-08 ENCOUNTER — Encounter (HOSPITAL_COMMUNITY): Payer: Self-pay

## 2023-04-08 ENCOUNTER — Encounter (HOSPITAL_COMMUNITY)
Admission: RE | Admit: 2023-04-08 | Discharge: 2023-04-08 | Disposition: A | Discharge status: Home / Self Care | Payer: Medicare Other | Source: Ambulatory Visit | Attending: Neurological Surgery | Admitting: Neurological Surgery

## 2023-04-08 VITALS — BP 133/83 | HR 68 | Temp 97.8°F | Resp 18 | Ht 69.0 in | Wt 180.6 lb

## 2023-04-08 DIAGNOSIS — I480 Paroxysmal atrial fibrillation: Secondary | ICD-10-CM | POA: Insufficient documentation

## 2023-04-08 DIAGNOSIS — I2510 Atherosclerotic heart disease of native coronary artery without angina pectoris: Secondary | ICD-10-CM | POA: Insufficient documentation

## 2023-04-08 DIAGNOSIS — Z01818 Encounter for other preprocedural examination: Secondary | ICD-10-CM

## 2023-04-08 DIAGNOSIS — I10 Essential (primary) hypertension: Secondary | ICD-10-CM | POA: Insufficient documentation

## 2023-04-08 DIAGNOSIS — Z8551 Personal history of malignant neoplasm of bladder: Secondary | ICD-10-CM | POA: Insufficient documentation

## 2023-04-08 DIAGNOSIS — Z951 Presence of aortocoronary bypass graft: Secondary | ICD-10-CM | POA: Insufficient documentation

## 2023-04-08 DIAGNOSIS — M5412 Radiculopathy, cervical region: Secondary | ICD-10-CM | POA: Insufficient documentation

## 2023-04-08 LAB — CBC
HCT: 43.4 % (ref 39.0–52.0)
Hemoglobin: 14.2 g/dL (ref 13.0–17.0)
MCH: 31.3 pg (ref 26.0–34.0)
MCHC: 32.7 g/dL (ref 30.0–36.0)
MCV: 95.6 fL (ref 80.0–100.0)
Platelets: 273 10*3/uL (ref 150–400)
RBC: 4.54 MIL/uL (ref 4.22–5.81)
RDW: 14.6 % (ref 11.5–15.5)
WBC: 10.5 10*3/uL (ref 4.0–10.5)
nRBC: 0.2 % (ref 0.0–0.2)

## 2023-04-08 LAB — BASIC METABOLIC PANEL
Anion gap: 9 (ref 5–15)
BUN: 45 mg/dL — ABNORMAL HIGH (ref 8–23)
CO2: 21 mmol/L — ABNORMAL LOW (ref 22–32)
Calcium: 8.8 mg/dL — ABNORMAL LOW (ref 8.9–10.3)
Chloride: 108 mmol/L (ref 98–111)
Creatinine, Ser: 1.81 mg/dL — ABNORMAL HIGH (ref 0.61–1.24)
GFR, Estimated: 38 mL/min — ABNORMAL LOW (ref >60)
Glucose, Bld: 98 mg/dL (ref 70–99)
Potassium: 4.1 mmol/L (ref 3.5–5.1)
Sodium: 138 mmol/L (ref 135–145)

## 2023-04-08 LAB — TYPE AND SCREEN
ABO/RH(D): O POS
Antibody Screen: NEGATIVE

## 2023-04-08 LAB — SURGICAL PCR SCREEN
MRSA, PCR: NEGATIVE
Staphylococcus aureus: POSITIVE — AB

## 2023-04-08 NOTE — Progress Notes (Signed)
Anesthesia Chart Review:   Case: [id number] Date/Time: 04/11/23 1015   Procedure: C3-4 C45 C5-6 C6-7 ACDF - RM 19 to follow   Anesthesia type: General   Pre-op diagnosis: Cervical radiculopathy   Location: [facility] ROOM 19 / [facility]   Surgeons: Barnett Abu, MD       DISCUSSION: Pt is 77 years old with hx CAD (s/p CABG 2013), afib (x2 episodes - one post-op after CABG, another 2021 during critical illness and electrolyte imbalance), HTN, bladder cancer  Per Shanda Bumps in Dr. Verlee Rossetti office, Dr. Danielle Dess notes the reason for surgery is weakness in RUE, cord flattening and severe foraminal stenosis, concern is neuropathic weakening. Per pt, he is in constant severe pain.   Per pt, he does not exercise anymore due to pain. Used to play golf. Can walk up a flight of stairs without chest pain or SOB.   Denies chest pain, SOB, dizziness, syncope, peripheral edema, palpitations.   Reviewed case with Dr. Hyacinth Meeker.    VS: BP 133/83   Pulse 68   Temp 36.6 C (Oral)   Resp 18   Ht 5\' 9"  (1.753 m)   Wt 81.9 kg   SpO2 100%   BMI 26.67 kg/m   PROVIDERS: - PCP is Garald Braver - Cardiologist is Thurmon Fair, MD. Last office visit 06/03/21  LABS: Labs reviewed: Acceptable for surgery. - Cr 1.81 is consistent with prior lab results; PCP note by Jinny Blossom, NP at [facility] (notes in care everywhere) 01/18/23 documents Cr was 1.8 last September.   (all labs ordered are listed, but only abnormal results are displayed)  Labs Reviewed  BASIC METABOLIC PANEL - Abnormal; Notable for the following components:      Result Value   CO2 21 (*)    BUN 45 (*)    Creatinine, Ser 1.81 (*)    Calcium 8.8 (*)    GFR, Estimated 38 (*)    All other components within normal limits  SURGICAL PCR SCREEN  CBC  TYPE AND SCREEN     IMAGES: CXR 08/09/22:  - No acute abnormality noted.   EKG 04/08/23: NSR    CV: Cardiac monitor 12/10/20:  2 episodes of NSVT, both lasting 7 beats 5 episodes of SVT, longest  lasting 10 beats No atrial fibrillation  Echo 11/11/20:  1. Left ventricular ejection fraction, by estimation, is 50%. The left ventricle has low normal function with beat to beat variability. The left ventricle has no regional wall motion abnormalities. There is mild left ventricular hypertrophy. Left ventricular diastolic parameters are indeterminate.  2. Right ventricular systolic function is normal. The right ventricular size is normal. Tricuspid regurgitation signal is inadequate for assessing PA pressure.  3. The mitral valve is normal in structure. Trivial mitral valve regurgitation. No evidence of mitral stenosis.  4. The aortic valve is normal in structure. Aortic valve regurgitation is not visualized. No aortic stenosis is present.   Exercise tolerance test 06/13/15:  There was no ST segment deviation noted during stress. No T wave inversion was noted during stress. The patient reported shortness of breath during the stress test. The patient experienced no angina during the stress test. Duke Treadmill Score: low risk   Past Medical History:  Diagnosis Date   Atrial fibrillation, rapid    With high ventricular rates. Intol amio, was on Sotalol, now on BB   Bladder cancer dx'd 10/2019   Bursitis of right shoulder    CAD (coronary artery disease) 11/2012   CABG  x 4 using left internal mammary artery and right endovein harvest. (LIMA-LAD; VG-OM1, OM2, VG-PD), EF 55-60% at cath   Degenerative disc disease, lumbar    Epigastric pain    Myoview 11/09/12 Showed evidence of reversible ischemia in both the inferior wall & the anteroapical distribution.    Erectile dysfunction    H/O ED while taking a beta-blocker   GERD (gastroesophageal reflux disease)    Gout    Hiatal hernia    Hyperlipidemia    Hypertension    Systemic HTN. Pt has H/O of ED when taking a beta-blocker. ECHO 11/26/08 Showed no evidence of any significant valvular disease, Estimated EF = 50-60%.   Kidney stone    "  i have  6-7 stones imbedded in my left kidney "    Mononeuritis of unspecified site    Nocturia    NSVT (nonsustained ventricular tachycardia)    Obesity    Osteoarthritis 08/2019   right knee; cortisone shot    PAF (paroxysmal atrial fibrillation)    Post CABG. Amia intol. 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 (LIMA-LAD; VG-OM1, OM2, VG-PD)   CYSTOSCOPY WITH INJECTION N/A 02/04/2021   Procedure: CYSTOSCOPY WITH INJECTION OF INDOCYANINE GREEN DYE;  Surgeon: Sebastian Ache, MD;  Location: [facility]S;  Service: Urology;  Laterality: N/A;   EYE SURGERY  1996   had lasik surgery bilaterally   IR IMAGING GUIDED PORT INSERTION  09/10/2020   LEFT HEART CATHETERIZATION WITH CORONARY ANGIOGRAM N/A 12/08/2012   Procedure: LEFT HEART CATHETERIZATION WITH CORONARY ANGIOGRAM;  Surgeon: Thurmon Fair, MD;  Location: [facility];  Service: Cardiovascular;  Laterality: N/A;   LYMPHADENECTOMY Bilateral 02/04/2021   Procedure: LYMPHADENECTOMY;  Surgeon: Sebastian Ache, MD;  Location: [facility];  Service: Urology;  Laterality: Bilateral;   ROBOT ASSISTED LAPAROSCOPIC COMPLETE CYSTECT ILEAL CONDUIT N/A 02/04/2021   Procedure: XI ROBOTIC ASSISTED LAPAROSCOPIC COMPLETE CYSTOPROSTATECTOMY ILEAL CONDUIT;  Surgeon: Sebastian Ache, MD;  Location: [facility];  Service: Urology;  Laterality: N/A;  6 HRS   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   SKIN CANCER EXCISION  2016   left side of face    TRANSURETHRAL RESECTION OF BLADDER TUMOR N/A 11/16/2019   Procedure:  TRANSURETHRAL RESECTION OF BLADDER TUMOR (TURBT) WITH INSTILLATION OF POST OPERATIVE CHEMOTHERAPY;  Surgeon: Ihor Gully, MD;  Location: [facility];  Service: Urology;  Laterality: N/A;   TRANSURETHRAL RESECTION OF BLADDER TUMOR WITH MITOMYCIN-C N/A 08/12/2020   Procedure: TRANSURETHRAL RESECTION OF BLADDER TUMOR WITH GEMCITABINE;  Surgeon: Noel Christmas, MD;  Location: [facility];  Service: Urology;  Laterality: N/A;  1 HR   UPPER GI ENDOSCOPY      MEDICATIONS:  allopurinol (ZYLOPRIM) 300 MG tablet   diclofenac Sodium (VOLTAREN) 1 % GEL   ferrous sulfate 325 (65 FE) MG tablet   fluticasone (FLONASE) 50 MCG/ACT nasal spray   ibuprofen (ADVIL) 200 MG tablet   Menthol, Topical Analgesic, (BIOFREEZE  EX)   Menthol-Methyl Salicylate (SALONPAS PAIN RELIEF PATCH EX)   methylPREDNISolone (MEDROL DOSEPAK) 4 MG TBPK tablet   metoprolol tartrate (LOPRESSOR) 50 MG tablet   simvastatin (ZOCOR) 40 MG tablet   traMADol (ULTRAM) 50 MG tablet   No current facility-administered medications for this encounter.    If no changes, I anticipate pt can proceed with surgery as scheduled.   Rica Mast, PhD, FNP-BC [facility]/Anesthesiology Phone: [phone] 04/08/2023 12:58 PM

## 2023-04-08 NOTE — Progress Notes (Signed)
PCP - Richarda Blade, FNP Cardiologist - Dr. Rachelle Hora Croitoru  PPM/ICD - denies   Chest x-ray - 08/09/22 EKG - 04/08/23 Stress Test - 06/13/15 ECHO - 11/11/20 Cardiac Cath - 12/08/12  OSA- denies  DM- denies  ASA/Blood Thinner Instructions: n/a   ERAS Protcol - yes, no drink   COVID TEST- n/a   Anesthesia review: yes, cardiac hx. Pt has not seen cardiologist since 2022. Rica Mast notified and will discuss with anesthesia  Patient denies shortness of breath, fever, cough and chest pain at PAT appointment   All instructions explained to the patient, with a verbal understanding of the material. Patient agrees to go over the instructions while at home for a better understanding. The opportunity to ask questions was provided.

## 2023-04-08 NOTE — Anesthesia Preprocedure Evaluation (Signed)
Anesthesia Evaluation  Patient identified by MRN, date of birth, ID band Patient awake    Reviewed: Allergy & Precautions, NPO status , Patient's Chart, lab work & pertinent test results  Airway Mallampati: II  TM Distance: >3 FB Neck ROM: Full    Dental  (+) Dental Advisory Given   Pulmonary neg pulmonary ROS   breath sounds clear to auscultation       Cardiovascular hypertension, Pt. on medications and Pt. on home beta blockers + CAD and + CABG   Rhythm:Regular Rate:Normal     Neuro/Psych  Neuromuscular disease    GI/Hepatic Neg liver ROS, hiatal hernia,GERD  ,,  Endo/Other  negative endocrine ROS    Renal/GU CRFRenal disease     Musculoskeletal  (+) Arthritis ,    Abdominal   Peds  Hematology negative hematology ROS (+)   Anesthesia Other Findings   Reproductive/Obstetrics                             Anesthesia Physical Anesthesia Plan  ASA: 3  Anesthesia Plan: General   Post-op Pain Management: Tylenol PO (pre-op)*   Induction: Intravenous  PONV Risk Score and Plan: 2 and Dexamethasone, Ondansetron and Treatment may vary due to age or medical condition  Airway Management Planned: Oral ETT and Video Laryngoscope Planned  Additional Equipment: ClearSight  Intra-op Plan:   Post-operative Plan: Extubation in OR  Informed Consent: I have reviewed the patients History and Physical, chart, labs and discussed the procedure including the risks, benefits and alternatives for the proposed anesthesia with the patient or authorized representative who has indicated his/her understanding and acceptance.     Dental advisory given  Plan Discussed with: CRNA  Anesthesia Plan Comments:        Anesthesia Quick Evaluation

## 2023-04-08 NOTE — Pre-Procedure Instructions (Addendum)
Surgical Instructions    Your procedure is scheduled on Monday, April 15th.  Report to [facility] Main Entrance "A" at 08:30 A.M., then check in with the Admitting office.  Call this number if you have problems the morning of surgery:  [phone]  If you have any questions prior to your surgery date call [phone]: Open Monday-Friday 8am-4pm If you experience any cold or flu symptoms such as cough, fever, chills, shortness of breath, etc. between now and your scheduled surgery, please notify us at the above number.     Remember:  Do not eat after midnight the night before your surgery  You may drink clear liquids until 07:30 AM the morning of your surgery.   Clear liquids allowed are: Water, Non-Citrus Juices (without pulp), Carbonated Beverages, Clear Tea, Black Coffee Only (NO MILK, CREAM OR POWDERED CREAMER of any kind), and Gatorade.    Take these medicines the morning of surgery with A SIP OF WATER  allopurinol (ZYLOPRIM)  methylPREDNISolone (MEDROL DOSEPAK)  metoprolol tartrate (LOPRESSOR)  traMADol (ULTRAM) if needed   As of today, STOP taking any Aspirin (unless otherwise instructed by your surgeon) Aleve, Naproxen, Ibuprofen, Motrin, Advil, Goody's, BC's, all herbal medications, fish oil, and all vitamins. This includes diclofenac Sodium (VOLTAREN) GEL                      Do NOT Smoke (Tobacco/Vaping) for 24 hours prior to your procedure.  If you use a CPAP at night, you may bring your mask/headgear for your overnight stay.   Contacts, glasses, piercing's, hearing aid's, dentures or partials may not be worn into surgery, please bring cases for these belongings.    For patients admitted to the hospital, discharge time will be determined by your treatment team.   Patients discharged the day of surgery will not be allowed to drive home, and someone needs to stay with them for 24 hours.  SURGICAL WAITING ROOM VISITATION Patients having surgery or a procedure may have  no more than 2 support people in the waiting area - these visitors may rotate.   Children under the age of 74 must have an adult with them who is not the patient. If the patient needs to stay at the hospital during part of their recovery, the visitor guidelines for inpatient rooms apply. Pre-op nurse will coordinate an appropriate time for 1 support person to accompany patient in pre-op.  This support person may not rotate.   Please refer to the [facility] website for the visitor guidelines for Inpatients (after your surgery is over and you are in a regular room).    Special instructions:   [place]- Preparing For Surgery  Before surgery, you can play an important role. Because skin is not sterile, your skin needs to be as free of germs as possible. You can reduce the number of germs on your skin by washing with CHG (chlorahexidine gluconate) Soap before surgery.  CHG is an antiseptic cleaner which kills germs and bonds with the skin to continue killing germs even after washing.    Oral Hygiene is also important to reduce your risk of infection.  Remember - BRUSH YOUR TEETH THE MORNING OF SURGERY WITH YOUR REGULAR TOOTHPASTE     Day of Surgery: Take a shower with CHG soap. Do not wear jewelry  Do not wear lotions, powders, colognes, or deodorant. Men may shave face and neck. Do not bring valuables to the hospital. [facility] is not responsible for any  belongings or valuables.  Wear Clean/Comfortable clothing the morning of surgery Remember to brush your teeth WITH YOUR REGULAR TOOTHPASTE.   Please read over the following fact sheets that you were given.    If you received a COVID test during your pre-op visit  it is requested that you wear a mask when out in public, stay away from anyone that may not be feeling well and notify your surgeon if you develop symptoms. If you have been in contact with anyone that has tested positive in the last 10 days please notify you surgeon.

## 2023-04-11 ENCOUNTER — Inpatient Hospital Stay (HOSPITAL_COMMUNITY): Payer: Medicare Other | Admitting: Emergency Medicine

## 2023-04-11 ENCOUNTER — Other Ambulatory Visit: Payer: Self-pay

## 2023-04-11 ENCOUNTER — Inpatient Hospital Stay (HOSPITAL_COMMUNITY): Payer: Medicare Other

## 2023-04-11 ENCOUNTER — Inpatient Hospital Stay (HOSPITAL_COMMUNITY): Payer: Medicare Other | Admitting: Certified Registered"

## 2023-04-11 ENCOUNTER — Inpatient Hospital Stay (HOSPITAL_COMMUNITY)
Admission: RE | Admit: 2023-04-11 | Discharge: 2023-04-12 | DRG: 472 | Disposition: A | Discharge status: Home Health | Payer: Medicare Other | Attending: Neurological Surgery | Admitting: Neurological Surgery

## 2023-04-11 ENCOUNTER — Encounter (HOSPITAL_COMMUNITY): Payer: Self-pay | Admitting: Neurological Surgery

## 2023-04-11 ENCOUNTER — Inpatient Hospital Stay (HOSPITAL_COMMUNITY)
Admission: RE | Disposition: A | Discharge status: Home / Self Care | Payer: Self-pay | Source: Home / Self Care | Attending: Neurological Surgery

## 2023-04-11 DIAGNOSIS — M5136 Other intervertebral disc degeneration, lumbar region: Secondary | ICD-10-CM | POA: Diagnosis present

## 2023-04-11 DIAGNOSIS — K219 Gastro-esophageal reflux disease without esophagitis: Secondary | ICD-10-CM | POA: Diagnosis present

## 2023-04-11 DIAGNOSIS — M50121 Cervical disc disorder at C4-C5 level with radiculopathy: Secondary | ICD-10-CM | POA: Diagnosis present

## 2023-04-11 DIAGNOSIS — Z8546 Personal history of malignant neoplasm of prostate: Secondary | ICD-10-CM

## 2023-04-11 DIAGNOSIS — M109 Gout, unspecified: Secondary | ICD-10-CM | POA: Diagnosis present

## 2023-04-11 DIAGNOSIS — I10 Essential (primary) hypertension: Secondary | ICD-10-CM | POA: Diagnosis present

## 2023-04-11 DIAGNOSIS — I129 Hypertensive chronic kidney disease with stage 1 through stage 4 chronic kidney disease, or unspecified chronic kidney disease: Secondary | ICD-10-CM | POA: Diagnosis not present

## 2023-04-11 DIAGNOSIS — Z932 Ileostomy status: Secondary | ICD-10-CM | POA: Diagnosis not present

## 2023-04-11 DIAGNOSIS — M50122 Cervical disc disorder at C5-C6 level with radiculopathy: Secondary | ICD-10-CM | POA: Diagnosis present

## 2023-04-11 DIAGNOSIS — M4802 Spinal stenosis, cervical region: Secondary | ICD-10-CM | POA: Diagnosis present

## 2023-04-11 DIAGNOSIS — M5412 Radiculopathy, cervical region: Principal | ICD-10-CM | POA: Diagnosis present

## 2023-04-11 DIAGNOSIS — M4712 Other spondylosis with myelopathy, cervical region: Principal | ICD-10-CM | POA: Diagnosis present

## 2023-04-11 DIAGNOSIS — M4803 Spinal stenosis, cervicothoracic region: Secondary | ICD-10-CM | POA: Diagnosis present

## 2023-04-11 DIAGNOSIS — E785 Hyperlipidemia, unspecified: Secondary | ICD-10-CM | POA: Diagnosis present

## 2023-04-11 DIAGNOSIS — M2578 Osteophyte, vertebrae: Secondary | ICD-10-CM | POA: Diagnosis present

## 2023-04-11 DIAGNOSIS — N189 Chronic kidney disease, unspecified: Secondary | ICD-10-CM | POA: Diagnosis not present

## 2023-04-11 DIAGNOSIS — B91 Sequelae of poliomyelitis: Secondary | ICD-10-CM

## 2023-04-11 DIAGNOSIS — M4722 Other spondylosis with radiculopathy, cervical region: Secondary | ICD-10-CM | POA: Diagnosis present

## 2023-04-11 DIAGNOSIS — M5000 Cervical disc disorder with myelopathy, unspecified cervical region: Secondary | ICD-10-CM | POA: Diagnosis not present

## 2023-04-11 DIAGNOSIS — I480 Paroxysmal atrial fibrillation: Secondary | ICD-10-CM | POA: Diagnosis present

## 2023-04-11 DIAGNOSIS — M5010 Cervical disc disorder with radiculopathy, unspecified cervical region: Secondary | ICD-10-CM

## 2023-04-11 DIAGNOSIS — Z8551 Personal history of malignant neoplasm of bladder: Secondary | ICD-10-CM

## 2023-04-11 DIAGNOSIS — Z888 Allergy status to other drugs, medicaments and biological substances status: Secondary | ICD-10-CM

## 2023-04-11 DIAGNOSIS — M5011 Cervical disc disorder with radiculopathy,  high cervical region: Secondary | ICD-10-CM | POA: Diagnosis present

## 2023-04-11 DIAGNOSIS — G8324 Monoplegia of upper limb affecting left nondominant side: Secondary | ICD-10-CM | POA: Diagnosis present

## 2023-04-11 DIAGNOSIS — Z85828 Personal history of other malignant neoplasm of skin: Secondary | ICD-10-CM

## 2023-04-11 DIAGNOSIS — Z87442 Personal history of urinary calculi: Secondary | ICD-10-CM

## 2023-04-11 DIAGNOSIS — M50123 Cervical disc disorder at C6-C7 level with radiculopathy: Secondary | ICD-10-CM | POA: Diagnosis present

## 2023-04-11 DIAGNOSIS — I2510 Atherosclerotic heart disease of native coronary artery without angina pectoris: Secondary | ICD-10-CM | POA: Diagnosis present

## 2023-04-11 DIAGNOSIS — Z79899 Other long term (current) drug therapy: Secondary | ICD-10-CM

## 2023-04-11 DIAGNOSIS — M50022 Cervical disc disorder at C5-C6 level with myelopathy: Secondary | ICD-10-CM | POA: Diagnosis present

## 2023-04-11 DIAGNOSIS — M50023 Cervical disc disorder at C6-C7 level with myelopathy: Secondary | ICD-10-CM | POA: Diagnosis present

## 2023-04-11 DIAGNOSIS — M5001 Cervical disc disorder with myelopathy,  high cervical region: Secondary | ICD-10-CM | POA: Diagnosis present

## 2023-04-11 DIAGNOSIS — M1711 Unilateral primary osteoarthritis, right knee: Secondary | ICD-10-CM | POA: Diagnosis present

## 2023-04-11 DIAGNOSIS — Z885 Allergy status to narcotic agent status: Secondary | ICD-10-CM

## 2023-04-11 DIAGNOSIS — G959 Disease of spinal cord, unspecified: Principal | ICD-10-CM | POA: Diagnosis present

## 2023-04-11 DIAGNOSIS — Z85820 Personal history of malignant melanoma of skin: Secondary | ICD-10-CM

## 2023-04-11 DIAGNOSIS — M50021 Cervical disc disorder at C4-C5 level with myelopathy: Secondary | ICD-10-CM | POA: Diagnosis present

## 2023-04-11 DIAGNOSIS — Z951 Presence of aortocoronary bypass graft: Secondary | ICD-10-CM

## 2023-04-11 HISTORY — PX: ANTERIOR CERVICAL DECOMPRESSION/DISCECTOMY FUSION 4 LEVELS: SHX5556

## 2023-04-11 SURGERY — ANTERIOR CERVICAL DECOMPRESSION/DISCECTOMY FUSION 4 LEVELS
Anesthesia: General

## 2023-04-11 MED ORDER — 0.9 % SODIUM CHLORIDE (POUR BTL) OPTIME
TOPICAL | Status: DC | PRN
  Administered 2023-04-11: 1000 mL

## 2023-04-11 MED ORDER — FENTANYL CITRATE (PF) 250 MCG/5ML IJ SOLN
INTRAMUSCULAR | Status: AC
  Filled 2023-04-11: qty 5

## 2023-04-11 MED ORDER — MORPHINE SULFATE (PF) 2 MG/ML IV SOLN: 2.0000 mg | INTRAVENOUS | Status: DC | PRN

## 2023-04-11 MED ORDER — POLYETHYLENE GLYCOL 3350 17 G PO PACK: 17.0000 g | PACK | Freq: Every day | ORAL | Status: DC | PRN

## 2023-04-11 MED ORDER — KETAMINE HCL 50 MG/5ML IJ SOSY
PREFILLED_SYRINGE | INTRAMUSCULAR | Status: AC
  Filled 2023-04-11: qty 5

## 2023-04-11 MED ORDER — LIDOCAINE-EPINEPHRINE 1 %-1:100000 IJ SOLN
INTRAMUSCULAR | Status: DC | PRN
  Administered 2023-04-11: 3.5 mL

## 2023-04-11 MED ORDER — CEFAZOLIN SODIUM-DEXTROSE 2-3 GM-%(50ML) IV SOLR
INTRAVENOUS | Status: DC | PRN
  Administered 2023-04-11: 2 g via INTRAVENOUS

## 2023-04-11 MED ORDER — ONDANSETRON HCL 4 MG/2ML IJ SOLN: 4.0000 mg | Freq: Four times a day (QID) | INTRAMUSCULAR | Status: DC | PRN

## 2023-04-11 MED ORDER — ACETAMINOPHEN 500 MG PO TABS
1000.0000 mg | ORAL_TABLET | Freq: Once | ORAL | Status: AC
  Administered 2023-04-11: 1000 mg via ORAL
  Filled 2023-04-11: qty 2

## 2023-04-11 MED ORDER — LIDOCAINE-EPINEPHRINE 1 %-1:100000 IJ SOLN: INTRAMUSCULAR | Status: DC | PRN

## 2023-04-11 MED ORDER — ORAL CARE MOUTH RINSE: 15.0000 mL | Freq: Once | OROMUCOSAL | Status: AC

## 2023-04-11 MED ORDER — FLUTICASONE PROPIONATE 50 MCG/ACT NA SUSP
1.0000 | Freq: Every day | NASAL | Status: DC
  Filled 2023-04-11: qty 16

## 2023-04-11 MED ORDER — THROMBIN 5000 UNITS EX SOLR
CUTANEOUS | Status: AC
  Filled 2023-04-11: qty 10000

## 2023-04-11 MED ORDER — SODIUM CHLORIDE 0.9 % IV SOLN
250.0000 mL | INTRAVENOUS | Status: DC
  Administered 2023-04-11: 250 mL via INTRAVENOUS

## 2023-04-11 MED ORDER — DEXAMETHASONE SODIUM PHOSPHATE 10 MG/ML IJ SOLN
INTRAMUSCULAR | Status: AC
  Filled 2023-04-11: qty 1

## 2023-04-11 MED ORDER — CEFAZOLIN SODIUM-DEXTROSE 2-4 GM/100ML-% IV SOLN: 2.0000 g | INTRAVENOUS | Status: DC

## 2023-04-11 MED ORDER — METHOCARBAMOL 1000 MG/10ML IJ SOLN
500.0000 mg | Freq: Four times a day (QID) | INTRAVENOUS | Status: DC | PRN
  Filled 2023-04-11: qty 5

## 2023-04-11 MED ORDER — PROPOFOL 10 MG/ML IV BOLUS
INTRAVENOUS | Status: DC | PRN
  Administered 2023-04-11: 140 mg via INTRAVENOUS

## 2023-04-11 MED ORDER — ONDANSETRON HCL 4 MG PO TABS: 4.0000 mg | ORAL_TABLET | Freq: Four times a day (QID) | ORAL | Status: DC | PRN

## 2023-04-11 MED ORDER — DOCUSATE SODIUM 100 MG PO CAPS
100.0000 mg | ORAL_CAPSULE | Freq: Two times a day (BID) | ORAL | Status: DC
  Administered 2023-04-11 – 2023-04-12 (×2): 100 mg via ORAL
  Filled 2023-04-11 (×2): qty 1

## 2023-04-11 MED ORDER — LIDOCAINE-EPINEPHRINE 1 %-1:100000 IJ SOLN
INTRAMUSCULAR | Status: AC
  Filled 2023-04-11: qty 1

## 2023-04-11 MED ORDER — PHENYLEPHRINE 80 MCG/ML (10ML) SYRINGE FOR IV PUSH (FOR BLOOD PRESSURE SUPPORT)
PREFILLED_SYRINGE | INTRAVENOUS | Status: AC
  Filled 2023-04-11: qty 10

## 2023-04-11 MED ORDER — SODIUM CHLORIDE 0.9% FLUSH: 3.0000 mL | INTRAVENOUS | Status: DC | PRN

## 2023-04-11 MED ORDER — THROMBIN 5000 UNITS EX SOLR
OROMUCOSAL | Status: DC | PRN
  Administered 2023-04-11: 5 mL via TOPICAL

## 2023-04-11 MED ORDER — METHOCARBAMOL 500 MG PO TABS
500.0000 mg | ORAL_TABLET | Freq: Four times a day (QID) | ORAL | Status: DC | PRN
  Administered 2023-04-11 – 2023-04-12 (×4): 500 mg via ORAL
  Filled 2023-04-11 (×4): qty 1

## 2023-04-11 MED ORDER — CHLORHEXIDINE GLUCONATE 0.12 % MT SOLN
15.0000 mL | Freq: Once | OROMUCOSAL | Status: AC
  Administered 2023-04-11: 15 mL via OROMUCOSAL
  Filled 2023-04-11: qty 15

## 2023-04-11 MED ORDER — CEFAZOLIN SODIUM-DEXTROSE 2-4 GM/100ML-% IV SOLN
2.0000 g | Freq: Three times a day (TID) | INTRAVENOUS | Status: AC
  Administered 2023-04-11 – 2023-04-12 (×2): 2 g via INTRAVENOUS
  Filled 2023-04-11 (×2): qty 100

## 2023-04-11 MED ORDER — PHENYLEPHRINE HCL-NACL 20-0.9 MG/250ML-% IV SOLN
INTRAVENOUS | Status: DC | PRN
  Administered 2023-04-11: 40 ug/min via INTRAVENOUS

## 2023-04-11 MED ORDER — PROPOFOL 1000 MG/100ML IV EMUL
INTRAVENOUS | Status: AC
  Filled 2023-04-11: qty 200

## 2023-04-11 MED ORDER — SODIUM CHLORIDE 0.9% FLUSH
3.0000 mL | Freq: Two times a day (BID) | INTRAVENOUS | Status: DC
  Administered 2023-04-11: 3 mL via INTRAVENOUS

## 2023-04-11 MED ORDER — ALLOPURINOL 300 MG PO TABS
300.0000 mg | ORAL_TABLET | Freq: Every day | ORAL | Status: DC
  Administered 2023-04-12: 300 mg via ORAL
  Filled 2023-04-11: qty 1

## 2023-04-11 MED ORDER — MENTHOL 3 MG MT LOZG: 1.0000 | LOZENGE | OROMUCOSAL | Status: DC | PRN

## 2023-04-11 MED ORDER — CHLORHEXIDINE GLUCONATE CLOTH 2 % EX PADS: 6.0000 | MEDICATED_PAD | Freq: Once | CUTANEOUS | Status: DC

## 2023-04-11 MED ORDER — BUPIVACAINE-EPINEPHRINE (PF) 0.5% -1:200000 IJ SOLN
INTRAMUSCULAR | Status: DC | PRN
  Administered 2023-04-11: 3.5 mL via PERINEURAL

## 2023-04-11 MED ORDER — AMISULPRIDE (ANTIEMETIC) 5 MG/2ML IV SOLN: 10.0000 mg | Freq: Once | INTRAVENOUS | Status: DC | PRN

## 2023-04-11 MED ORDER — MIDAZOLAM HCL 2 MG/2ML IJ SOLN
INTRAMUSCULAR | Status: AC
  Filled 2023-04-11: qty 2

## 2023-04-11 MED ORDER — ONDANSETRON HCL 4 MG/2ML IJ SOLN
INTRAMUSCULAR | Status: AC
  Filled 2023-04-11: qty 2

## 2023-04-11 MED ORDER — ACETAMINOPHEN 10 MG/ML IV SOLN
INTRAVENOUS | Status: AC
  Filled 2023-04-11: qty 100

## 2023-04-11 MED ORDER — MIDAZOLAM HCL 2 MG/2ML IJ SOLN
INTRAMUSCULAR | Status: DC | PRN
  Administered 2023-04-11 (×2): 1 mg via INTRAVENOUS

## 2023-04-11 MED ORDER — LIDOCAINE 2% (20 MG/ML) 5 ML SYRINGE
INTRAMUSCULAR | Status: DC | PRN
  Administered 2023-04-11: 40 mg via INTRAVENOUS

## 2023-04-11 MED ORDER — SUGAMMADEX SODIUM 200 MG/2ML IV SOLN
INTRAVENOUS | Status: DC | PRN
  Administered 2023-04-11: 200 mg via INTRAVENOUS

## 2023-04-11 MED ORDER — ACETAMINOPHEN 650 MG RE SUPP: 650.0000 mg | RECTAL | Status: DC | PRN

## 2023-04-11 MED ORDER — SENNA 8.6 MG PO TABS
1.0000 | ORAL_TABLET | Freq: Two times a day (BID) | ORAL | Status: DC
  Administered 2023-04-11 – 2023-04-12 (×2): 8.6 mg via ORAL
  Filled 2023-04-11 (×2): qty 1

## 2023-04-11 MED ORDER — FENTANYL CITRATE (PF) 100 MCG/2ML IJ SOLN
INTRAMUSCULAR | Status: AC
  Filled 2023-04-11: qty 2

## 2023-04-11 MED ORDER — PHENYLEPHRINE 80 MCG/ML (10ML) SYRINGE FOR IV PUSH (FOR BLOOD PRESSURE SUPPORT)
PREFILLED_SYRINGE | INTRAVENOUS | Status: DC | PRN
  Administered 2023-04-11 (×2): 80 ug via INTRAVENOUS

## 2023-04-11 MED ORDER — DEXAMETHASONE SODIUM PHOSPHATE 10 MG/ML IJ SOLN
INTRAMUSCULAR | Status: DC | PRN
  Administered 2023-04-11: 10 mg via INTRAVENOUS

## 2023-04-11 MED ORDER — BISACODYL 10 MG RE SUPP: 10.0000 mg | Freq: Every day | RECTAL | Status: DC | PRN

## 2023-04-11 MED ORDER — FENTANYL CITRATE (PF) 250 MCG/5ML IJ SOLN
INTRAMUSCULAR | Status: DC | PRN
  Administered 2023-04-11: 50 ug via INTRAVENOUS
  Administered 2023-04-11: 100 ug via INTRAVENOUS
  Administered 2023-04-11 (×2): 50 ug via INTRAVENOUS

## 2023-04-11 MED ORDER — LIDOCAINE 2% (20 MG/ML) 5 ML SYRINGE
INTRAMUSCULAR | Status: AC
  Filled 2023-04-11: qty 5

## 2023-04-11 MED ORDER — OXYCODONE-ACETAMINOPHEN 5-325 MG PO TABS
1.0000 | ORAL_TABLET | Freq: Four times a day (QID) | ORAL | Status: DC | PRN
  Administered 2023-04-11 – 2023-04-12 (×4): 2 via ORAL
  Filled 2023-04-11 (×5): qty 2

## 2023-04-11 MED ORDER — BUPIVACAINE HCL (PF) 0.5 % IJ SOLN
INTRAMUSCULAR | Status: AC
  Filled 2023-04-11: qty 30

## 2023-04-11 MED ORDER — ACETAMINOPHEN 325 MG PO TABS
650.0000 mg | ORAL_TABLET | ORAL | Status: DC | PRN
  Filled 2023-04-11: qty 2

## 2023-04-11 MED ORDER — PROPOFOL 10 MG/ML IV BOLUS
INTRAVENOUS | Status: AC
  Filled 2023-04-11: qty 20

## 2023-04-11 MED ORDER — METOPROLOL TARTRATE 50 MG PO TABS
50.0000 mg | ORAL_TABLET | Freq: Two times a day (BID) | ORAL | Status: DC
  Administered 2023-04-11 – 2023-04-12 (×2): 50 mg via ORAL
  Filled 2023-04-11 (×2): qty 1

## 2023-04-11 MED ORDER — FENTANYL CITRATE (PF) 100 MCG/2ML IJ SOLN
25.0000 ug | INTRAMUSCULAR | Status: DC | PRN
  Administered 2023-04-11: 50 ug via INTRAVENOUS

## 2023-04-11 MED ORDER — ROCURONIUM BROMIDE 10 MG/ML (PF) SYRINGE
PREFILLED_SYRINGE | INTRAVENOUS | Status: AC
  Filled 2023-04-11: qty 10

## 2023-04-11 MED ORDER — LACTATED RINGERS IV SOLN: INTRAVENOUS | Status: DC

## 2023-04-11 MED ORDER — THROMBIN 20000 UNITS EX SOLR
CUTANEOUS | Status: AC
  Filled 2023-04-11: qty 20000

## 2023-04-11 MED ORDER — FLEET ENEMA 7-19 GM/118ML RE ENEM: 1.0000 | ENEMA | Freq: Once | RECTAL | Status: DC | PRN

## 2023-04-11 MED ORDER — ROCURONIUM BROMIDE 10 MG/ML (PF) SYRINGE
PREFILLED_SYRINGE | INTRAVENOUS | Status: DC | PRN
  Administered 2023-04-11: 40 mg via INTRAVENOUS
  Administered 2023-04-11: 20 mg via INTRAVENOUS

## 2023-04-11 MED ORDER — ONDANSETRON HCL 4 MG/2ML IJ SOLN
INTRAMUSCULAR | Status: DC | PRN
  Administered 2023-04-11: 4 mg via INTRAVENOUS

## 2023-04-11 MED ORDER — PHENOL 1.4 % MT LIQD: 1.0000 | OROMUCOSAL | Status: DC | PRN

## 2023-04-11 MED ORDER — SIMVASTATIN 20 MG PO TABS
40.0000 mg | ORAL_TABLET | Freq: Every day | ORAL | Status: DC
  Administered 2023-04-11: 40 mg via ORAL
  Filled 2023-04-11: qty 2

## 2023-04-11 MED ORDER — EPHEDRINE 5 MG/ML INJ
INTRAVENOUS | Status: AC
  Filled 2023-04-11: qty 5

## 2023-04-11 MED ORDER — TRAMADOL HCL 50 MG PO TABS: 50.0000 mg | ORAL_TABLET | Freq: Four times a day (QID) | ORAL | Status: DC | PRN

## 2023-04-11 SURGICAL SUPPLY — 59 items
ADH SKN CLS APL DERMABOND .7 (GAUZE/BANDAGES/DRESSINGS) ×1
BAG COUNTER SPONGE SURGICOUNT (BAG) ×2 IMPLANT
BAG SPNG CNTER NS LX DISP (BAG) ×2
BAND INSRT 18 STRL LF DISP RB (MISCELLANEOUS)
BAND RUBBER #18 3X1/16 STRL (MISCELLANEOUS) IMPLANT
BASKET BONE COLLECTION (BASKET) IMPLANT
BIT DRILL ACP 15 (DRILL) IMPLANT
BIT DRILL NEURO 2X3.1 SFT TUCH (MISCELLANEOUS) ×2 IMPLANT
BNDG GAUZE DERMACEA FLUFF 4 (GAUZE/BANDAGES/DRESSINGS) IMPLANT
BNDG GZE DERMACEA 4 6PLY (GAUZE/BANDAGES/DRESSINGS)
BUR BARREL STRAIGHT FLUTE 4.0 (BURR) IMPLANT
CAGE CERV MOD 7X17X14 7D (Cage) IMPLANT
CANISTER SUCT 3000ML PPV (MISCELLANEOUS) ×2 IMPLANT
DERMABOND ADVANCED .7 DNX12 (GAUZE/BANDAGES/DRESSINGS) ×2 IMPLANT
DRAPE LAPAROTOMY 100X72 PEDS (DRAPES) ×2 IMPLANT
DRAPE MICROSCOPE SLANT 54X150 (MISCELLANEOUS) IMPLANT
DRILL ACP 15 (DRILL) ×1
DRILL NEURO 2X3.1 SOFT TOUCH (MISCELLANEOUS) ×1
DURAPREP 6ML APPLICATOR 50/CS (WOUND CARE) ×2 IMPLANT
ELECT COATED BLADE 2.86 ST (ELECTRODE) ×2 IMPLANT
ELECT REM PT RETURN 9FT ADLT (ELECTROSURGICAL) ×1
ELECTRODE REM PT RTRN 9FT ADLT (ELECTROSURGICAL) ×2 IMPLANT
EVACUATOR 1/8 PVC DRAIN (DRAIN) IMPLANT
GAUZE 4X4 16PLY ~~LOC~~+RFID DBL (SPONGE) IMPLANT
GAUZE SPONGE 4X4 12PLY STRL (GAUZE/BANDAGES/DRESSINGS) IMPLANT
GEL DBM PROPEL 5ML (Putty) IMPLANT
GLOVE BIOGEL PI IND STRL 8.5 (GLOVE) ×2 IMPLANT
GLOVE ECLIPSE 8.5 STRL (GLOVE) ×2 IMPLANT
GLOVE EXAM NITRILE XL STR (GLOVE) IMPLANT
GOWN STRL REUS W/ TWL LRG LVL3 (GOWN DISPOSABLE) IMPLANT
GOWN STRL REUS W/ TWL XL LVL3 (GOWN DISPOSABLE) ×2 IMPLANT
GOWN STRL REUS W/TWL 2XL LVL3 (GOWN DISPOSABLE) ×2 IMPLANT
GOWN STRL REUS W/TWL LRG LVL3 (GOWN DISPOSABLE) ×1
GOWN STRL REUS W/TWL XL LVL3 (GOWN DISPOSABLE)
HALTER HD/CHIN CERV TRACTION D (MISCELLANEOUS) ×2 IMPLANT
HEMOSTAT POWDER KIT SURGIFOAM (HEMOSTASIS) ×2 IMPLANT
KIT BASIN OR (CUSTOM PROCEDURE TRAY) ×2 IMPLANT
KIT TURNOVER KIT B (KITS) ×2 IMPLANT
NDL SPNL 22GX3.5 QUINCKE BK (NEEDLE) ×2 IMPLANT
NEEDLE HYPO 22GX1.5 SAFETY (NEEDLE) ×2 IMPLANT
NEEDLE SPNL 22GX3.5 QUINCKE BK (NEEDLE) ×1 IMPLANT
NS IRRIG 1000ML POUR BTL (IV SOLUTION) ×2 IMPLANT
PACK LAMINECTOMY NEURO (CUSTOM PROCEDURE TRAY) ×2 IMPLANT
PAD ARMBOARD 7.5X6 YLW CONV (MISCELLANEOUS) ×6 IMPLANT
PATTIES SURGICAL .5 X1 (DISPOSABLE) ×2 IMPLANT
PIN ACP TEMP FIXATION (EXFIX) IMPLANT
PLATE SPINAL ACP 78 4L 2.1H (Plate) IMPLANT
SCREW ACP VA ST 3.5X15 (Screw) IMPLANT
SET WALTER ACTIVATION W/DRAPE (SET/KITS/TRAYS/PACK) ×2 IMPLANT
SOL ELECTROSURG ANTI STICK (MISCELLANEOUS)
SOLUTION ELECTROSURG ANTI STCK (MISCELLANEOUS) ×2 IMPLANT
SPIKE FLUID TRANSFER (MISCELLANEOUS) ×2 IMPLANT
SPONGE INTESTINAL PEANUT (DISPOSABLE) ×2 IMPLANT
SUT VIC AB 4-0 RB1 18 (SUTURE) ×4 IMPLANT
TAPE CLOTH SURG 4X10 WHT LF (GAUZE/BANDAGES/DRESSINGS) IMPLANT
TOWEL GREEN STERILE (TOWEL DISPOSABLE) ×2 IMPLANT
TOWEL GREEN STERILE FF (TOWEL DISPOSABLE) ×2 IMPLANT
TUBING FEATHERFLOW (TUBING) ×2 IMPLANT
WATER STERILE IRR 1000ML POUR (IV SOLUTION) ×2 IMPLANT

## 2023-04-11 NOTE — Anesthesia Postprocedure Evaluation (Signed)
Anesthesia Post Note  Patient: Hunter [name]  Procedure(s) Performed: Cervical three-four Cervical four-five Cervical five-six Cervical six-seven Anterior Cervical Decompression Fusion     Patient location during evaluation: PACU Anesthesia Type: General Level of consciousness: awake and alert and oriented Pain management: pain level controlled Vital Signs Assessment: post-procedure vital signs reviewed and stable Respiratory status: spontaneous breathing, nonlabored ventilation and respiratory function stable Cardiovascular status: blood pressure returned to baseline and stable Postop Assessment: no apparent nausea or vomiting Anesthetic complications: no   No notable events documented.  Last Vitals:  Vitals:   04/11/23 1630 04/11/23 1645  BP: (!) 146/77 122/72  Pulse: 72 68  Resp: 12 13  Temp:    SpO2: 97% 91%    Last Pain:  Vitals:   04/11/23 1645  TempSrc:   PainSc: 7                  Wylie Coon A.

## 2023-04-11 NOTE — Progress Notes (Signed)
Orthopedic Tech Progress Note Patient Details:  Hunter [name] [birth year] [id number]  Ortho Devices Type of Ortho Device: Soft collar Ortho Device/Splint Location: NECK Ortho Device/Splint Interventions: Ordered, Application   Post Interventions Patient Tolerated: Well Instructions Provided: Care of device  Donald Pore 04/11/2023, 6:31 PM

## 2023-04-11 NOTE — Op Note (Signed)
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 Postoperative diagnosis: Same Procedure: Anterior cervical decompression C3-4 C4-5 C5-6 and C6-C7 arthrodesis with structural spacer and morselized allograft and autograft C3-4 C4-5 C5-6 and C6-7 anterior plate fixation with 78 mm ACP plate and 10 locking 3-1/2 x 15 mm screws. Surgeon: Barnett Abu Anesthesia: General endotracheal Indications: Hunter [name] is a 77 year old individual who had polio as a young child.  He has had weakness in the left upper extremity and his right upper extremity has been strongly dominant.  The patient has developed progressive difficulty with walking in addition to the difficulty with strength in his right upper extremity he has profound spondylitic stenosis with cord compression at C3-4 and C4-5 and to a lesser extent at C5-6.  He has evidence of severe right foraminal stenosis at each of these levels also.  Has been advised regarding the need for surgical decompression and stabilization.  Procedure: The patient was brought to the operating room supine on the stretcher.  After the smooth induction of general endotracheal anesthesia, he was carefully placed and 5 pounds of halter traction.  The neck was prepped with alcohol DuraPrep and draped in a sterile fashion.  Transverse incision was made in the left side of the neck and carried down through the platysma.  Plane between the sternocleidomastoid and strap muscles dissected bluntly into the prevertebral space was reached.  First identifiable disc space was noted to be that of C4-5.  Then by releasing tissues well cephalad and distally in the subcutaneous region and allowing for greater exposure the C3-4 level was exposed.  Longus coli muscle was stripped off of each side of midline and a self-retaining retractor was placed in this region.  The anterior aspect of the  disc base was opened with a 15 blade and a combination of curettes and rongeurs was used to remove a substantial quantity of severely degenerated and desiccated disc material.  High-speed drill was used to remove substantial overgrown osteophyte from either lateral gutter and the dissection was carried out to expose the right-sided foramen and decompress it fully.  The central canal was decompressed as was the left-sided foramen.  Then a 7 x 17 x 14 mm spacer with 7 degrees lordosis was fitted with a combination of autograft and allograft which was morselized demineralized bony matrix.  This was placed into the interspace attention was turned to C4-5 where similar process was carried out same process was then carried out at C5-6 and C6-7 each time using the same size spacer 7 x 17 x 14 mm with a 7 degrees lordosis.  Once all the levels were decompressed and hemostasis in all the soft tissues was obtained a 78 mm precontoured ACP plate was fitted to the ventral aspect of the vertebral bodies and secured with 10 locking 3-1/2 x 15 mm screws.  Again hemostasis in the soft tissues was checked as the retractor was removed.  When the hemostasis was verified and final radiograph was of obtained to verify position of the spacers the plate and the graft and always sound a medium size Hemovac drain was placed deep into the wound brought out through separate stab incision and then the wound was closed with 4-0 Vicryl in the platysma 4-0 Vicryl in the subcuticular tissue and a dry sterile dressing on the skin.  Patient tolerated procedure well blood loss was estimated at approximately 120 cc.

## 2023-04-11 NOTE — H&P (Signed)
CHIEF COMPLAINT: Right arm pain, numbness, tingling, and weakness.  HISTORY OF PRESENT ILLNESS: Hunter [name] is a 77 year old right-handed individual.  Hunter [name] tells me that as an infant he had polio and this affected his left side, and his left upper extremity has been substantially weakened and paralyzed.  He had a shoulder dystocia on that left side and ultimately had to have surgery.  He notes that his left arm he can use for a simple extension exercise, but it does not work well for any useful purposes in doing things.  He, therefore, has become very strongly right-handed, and he has depended on his right upper extremity all his life.  He notes that in the past 3 weeks or so he has developed progressive pain in the right arm and shoulder, particularly if the arm hangs down.  He notes that he tends to put his arm up over his head.  Moreover though he has developed tingling and numbness into the right hand in basically all the digits, but mostly in the index and the center long fingers that he has the worst numbness and tingling.  He does not feel that he has had that much weakness, but he has become aware that certain fine motor activities are becoming increasingly difficult, such as buttoning buttons and such.  He has been seen in the [place] area, and an MRI of the cervical spine was performed about March 18th.  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  There is particularly severe right-sided foraminal stenosis at each of the levels, and there is some moderate left-sided foraminal stenosis.  At C6-C7, he has perhaps the worst right-sided foraminal stenosis for the C7 nerve root.  At C7-T1, he has some mild-to-moderate foraminal stenosis and no evidence of cord compression.  Today in the office,  to further his workup, I obtained new radiographs of the cervical spine with a flexion-extension view.  The radiographs demonstrate that he maintains his alignment between flexion and extension, does not have a substantial listhesis.  However, on the oblique views, one can appreciate the severe foraminal stenosis he has on the right side compared to the mild foraminal stenosis noted on the left side on the oblique views.  PAST MEDICAL HISTORY: Notable for a history of having had some open-heart surgery that was done in December 2013.  He also tells me that he has had prostate and bladder cancer that was resected here in [facility] by Dr. 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  He also notes that he has some residual hernias, but he is tolerating that.  Patient notes that he has some lumbar spondylitic disease that bothers him also.  PHYSICAL EXAMINATION: Range of motion of his neck allows him to turn 30 degrees to the right, 45 degrees to the left.  He flexes and extends about 80% of normal.  There is moderate tenderness to palpation of the supraclavicular fossa on that right side, none on the left side.  His deltoid strength is absent on that left side, as is his biceps, and wrist extensor strength graded, at best, at 3/5.  Intrinsic strength is 1/5 on that left side.  On the right side though he has 4/5 strength in the deltoid, 4- out of 5  in the biceps, 4- out of 5 in the triceps, wrist extensors 4/5, grip strength is diminished to 4- out of 5, and intrinsic and finger extensor strength is diminished to 4/5.  Sensation is slightly diminished to vibration in the distal upper extremities.  IMPRESSION: The patient has profound weakness developing in that right upper extremity, which is his dominant arm.  I noted the findings on the MRI and correlate them with the findings on his physical exam.  I am concerned that, because of the combination of the cord flattening  and foraminal stenosis, he is having significant neuropathic weakening.  In that regard, I suggested he should have these areas decompressed.  This is a substantial operation that requires an anterior approach at C3-4, C4-5, C5-6, and C6-7 to remove the entirety of the disc, open the foramen, and then place a spacer in the interspace to stabilize this and place an anterior plate to stabilize all the joints together.  I demonstrated the plate on a radiographic image that we have in individuals who had such a procedure, and I demonstrated on a model the exact nature of the surgery.  I noted that the surgery typically requires only an overnight stay in the hospital.  The biggest thing that most patients notice is that they feel they have a lump in their throat after surgery and that is because they have a lump in their throat from the surgery that typically resolves itself over a period of a week or 2.  Some individuals have some difficulty swallowing, which may need to be remedied with the passage of time, and of course, there is the concern of a potential risk to the nerves themselves.  All these things not withstanding, given the fact that the patient is losing function already, I believe that he should consider and perform the surgery to decompress and stabilize these regions.  We will work to schedule him at his earliest convenience.

## 2023-04-11 NOTE — Transfer of Care (Signed)
Immediate Anesthesia Transfer of Care Note  Patient: Hunter [name]  Procedure(s) Performed: Cervical three-four Cervical four-five Cervical five-six Cervical six-seven Anterior Cervical Decompression Fusion  Patient Location: PACU  Anesthesia Type:General  Level of Consciousness: awake, alert , and patient cooperative  Airway & Oxygen Therapy: Patient Spontanous Breathing and Patient connected to nasal cannula oxygen  Post-op Assessment: Report given to RN and Post -op Vital signs reviewed and stable  Post vital signs: Reviewed and stable  Last Vitals:  Vitals Value Taken Time  BP 147/77 04/11/23 1602  Temp    Pulse 74 04/11/23 1607  Resp 12 04/11/23 1607  SpO2 97 % 04/11/23 1607  Vitals shown include unvalidated device data.  Last Pain:  Vitals:   04/11/23 0906  TempSrc:   PainSc: 6       Patients Stated Pain Goal: 2 (04/11/23 0906)  Complications: No notable events documented.

## 2023-04-12 ENCOUNTER — Encounter (HOSPITAL_COMMUNITY): Payer: Self-pay | Admitting: Neurological Surgery

## 2023-04-12 DIAGNOSIS — M50021 Cervical disc disorder at C4-C5 level with myelopathy: Secondary | ICD-10-CM | POA: Insufficient documentation

## 2023-04-12 MED ORDER — METHOCARBAMOL 500 MG PO TABS: 500.0000 mg | ORAL_TABLET | Freq: Four times a day (QID) | ORAL | 3 refills | Status: DC | PRN

## 2023-04-12 MED ORDER — OXYCODONE-ACETAMINOPHEN 5-325 MG PO TABS: 1.0000 | ORAL_TABLET | ORAL | 0 refills | Status: DC | PRN

## 2023-04-12 MED FILL — Thrombin For Soln 5000 Unit: CUTANEOUS | Qty: 5000 | Status: AC

## 2023-04-12 NOTE — Evaluation (Signed)
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 Pt with paralysis in L UE from polio as an infanct but now present with R UE weakness and inability to raise UE >45deg at shld. Pt able to ambulate without AD and close supervision and able to negotiate necessary stairs to enter home. Pt to benefit from outpt therapy for R UE strengthening as R UE is patients primary UE due to life long paralysis of L UE once cleared by MD. Acute PT to cont to follow.       Recommendations for follow up therapy are one component of a multi-disciplinary discharge planning process, led by the attending physician.  Recommendations may be updated based on patient status, additional functional criteria and insurance authorization.  Follow Up Recommendations       Assistance Recommended at Discharge Frequent or constant Supervision/Assistance  Patient can return home with the following  A little help with walking and/or transfers;A little help with bathing/dressing/bathroom;Assistance with feeding;Direct supervision/assist for medications management;Direct supervision/assist for financial management;Assist for transportation;Help with stairs or ramp for entrance    Equipment Recommendations None recommended by PT  Recommendations for Other Services       Functional Status Assessment Patient has had a recent decline in their functional status and demonstrates the ability to make significant improvements in function in a reasonable and predictable amount of time.     Precautions / Restrictions  Precautions Precautions: Cervical Precaution Booklet Issued: Yes (comment) Precaution Comments: pt and spouse with good undestanding Restrictions Weight Bearing Restrictions: No      Mobility  Bed Mobility Overal bed mobility: Needs Assistance Bed Mobility: Rolling, Sidelying to Sit Rolling: Min assist Sidelying to sit: Min assist       General bed mobility comments: minA to complete rolling due to limited bilat UE use, minA for trunk elevation    Transfers Overall transfer level: Needs assistance Equipment used: None Transfers: Sit to/from Stand Sit to Stand: Min guard           General transfer comment: pt able to stand from elevated surface to mimic bed height at home    Ambulation/Gait Ambulation/Gait assistance: Min guard Gait Distance (Feet): 200 Feet Assistive device: None Gait Pattern/deviations: Step-through pattern, Decreased stride length Gait velocity: dec     General Gait Details: step height and length but steady, good upright posture  Stairs Stairs: Yes Stairs assistance: Min guard Stair Management: One rail Right, Forwards   General stair comments: educated on sequence "up with the good, down with the bad." incresaed time, able to go up forward with step to pattern, down sideways due to handrail now on the L  Wheelchair Mobility    Modified Rankin (Stroke Patients Only)       Balance Overall balance assessment: Mild deficits observed, not formally tested                                           Pertinent Vitals/Pain Pain Assessment Pain Assessment:  No/denies pain    Home Living Family/patient expects to be discharged to:: Private residence Living Arrangements: Spouse/significant other Available Help at Discharge: Family;Friend(s);Available 24 hours/day Type of Home: House Home Access: Stairs to enter Entrance Stairs-Rails: None Entrance Stairs-Number of Steps: 1 Alternate Level Stairs-Number of Steps:  7 Home Layout: Multi-level (split level) Home Equipment: Rolling Walker (2 wheels);Shower seat - built in Additional Comments: patient can stay on den level which is no STE and wouldn't need to go up stairs to bathroom    Prior Function Prior Level of Function : Independent/Modified Independent;Driving             Mobility Comments: was golfing ADLs Comments: indep     Hand Dominance   Dominant Hand: Right    Extremity/Trunk Assessment   Upper Extremity Assessment Upper Extremity Assessment: RUE deficits/detail;LUE deficits/detail RUE Deficits / Details: unable to raise R UE >45 deg at shld height, can bend elbow and wrist LUE Deficits / Details: minimal active movement due to paralysis from polio as an infant    Lower Extremity Assessment Lower Extremity Assessment: Generalized weakness    Cervical / Trunk Assessment Cervical / Trunk Assessment: Neck Surgery  Communication   Communication: No difficulties  Cognition Arousal/Alertness: Awake/alert Behavior During Therapy: WFL for tasks assessed/performed Overall Cognitive Status: Within Functional Limits for tasks assessed                                 General Comments: tangtenial but redirectable        General Comments General comments (skin integrity, edema, etc.): surgical incision covered with honeycomb dressing    Exercises     Assessment/Plan    PT Assessment Patient needs continued PT services  PT Problem List Decreased strength;Decreased activity tolerance;Decreased balance       PT Treatment Interventions DME instruction;Gait training;Stair training;Functional mobility training;Therapeutic activities;Therapeutic exercise;Balance training    PT Goals (Current goals can be found in the Care Plan section)  Acute Rehab PT Goals Patient Stated Goal: home today PT Goal Formulation: With patient Time For Goal Achievement: 04/26/23 Potential to Achieve Goals: Good    Frequency Min  5X/week     Co-evaluation               AM-PAC PT "6 Clicks" Mobility  Outcome Measure Help needed turning from your back to your side while in a flat bed without using bedrails?: A Little Help needed moving from lying on your back to sitting on the side of a flat bed without using bedrails?: A Little Help needed moving to and from a bed to a chair (including a wheelchair)?: A Little Help needed standing up from a chair using your arms (e.g., wheelchair or bedside chair)?: A Little Help needed to walk in hospital room?: A Little Help needed climbing 3-5 steps with a railing? : A Little 6 Click Score: 18    End of Session Equipment Utilized During Treatment: Cervical collar (soft collar) Activity Tolerance: Patient tolerated treatment well Patient left: in bed;with call bell/phone within reach;with family/visitor present (EOB) Nurse Communication: Mobility status PT Visit Diagnosis: Difficulty in walking, not elsewhere classified (R26.2)    Time: 1610-9604 PT Time Calculation (min) (ACUTE ONLY): 40 min   Charges:   PT Evaluation $PT Eval Moderate Complexity: 1 Mod PT Treatments $Gait Training: 8-22 mins $Therapeutic Activity: 8-22 mins        Lewis Shock, PT, DPT Acute Rehabilitation Services  Secure chat preferred Office #: [phone]   Iona Hansen 04/12/2023, 9:54 AM

## 2023-04-12 NOTE — Evaluation (Addendum)
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 Patient currently completing functional ambulation with supervision no AD, but remains limited in completing bADLs/iADLs due to RUE deficits and weakness. RUE deficits impair patient's ability to complete bathing and dressing independently. Pt would benefit from continued acute skilled OT services to address above deficits and help transition to next level of care. Pt would benefit from outpatient OT services for RUE deficits.     Recommendations for follow up therapy are one component of a multi-disciplinary discharge planning process, led by the attending physician.  Recommendations may be updated based on patient status, additional functional criteria and insurance authorization.   Assistance Recommended at Discharge Set up Supervision/Assistance  Patient can return home with the following Assistance with feeding;Assist for transportation;Assistance with cooking/housework    Functional Status Assessment  Patient has had a recent decline in their functional status and demonstrates the ability to make significant improvements in function in a reasonable and predictable amount of time.  Equipment Recommendations  None recommended by OT    Recommendations for Other Services       Precautions / Restrictions Precautions Precautions: Cervical Precaution Booklet Issued: Yes  (comment) Precaution Comments: PT gave patient precautions handout, reinforced precautions Required Braces or Orthoses: Cervical Brace Cervical Brace: Soft collar;For comfort Restrictions Weight Bearing Restrictions: No      Mobility Bed Mobility Overal bed mobility: Needs Assistance             General bed mobility comments: Pt received and left sitting EOB    Transfers Overall transfer level: Needs assistance Equipment used: None Transfers: Sit to/from Stand Sit to Stand: Min guard                  Balance Overall balance assessment: Mild deficits observed, not formally tested                                         ADL either performed or assessed with clinical judgement   ADL Overall ADL's : Needs assistance/impaired Eating/Feeding: Supervision/ safety;Sitting;Set up   Grooming: Standing;Moderate assistance Grooming Details (indicate cue type and reason): d/t RUE deficits Upper Body Bathing: Moderate assistance;Sitting   Lower Body Bathing: Sitting/lateral leans;Moderate assistance   Upper Body Dressing : Moderate assistance;Sitting Upper Body Dressing Details (indicate cue type and reason): To don shirt, pulling over head d/t RUE deficits Lower Body Dressing: Moderate assistance;Sitting/lateral leans;Sit to/from stand Lower Body Dressing Details (indicate cue type and reason): to don socks, able to don slip on shoes in figure four with Supervision, Mod A to pull up pants and underwear Toilet Transfer: Supervision/safety;Ambulation Toilet Transfer Details (indicate cue type and reason): no AD Toileting- Clothing Manipulation and Hygiene: Supervision/safety;Sitting/lateral lean   Tub/ Shower Transfer: Min guard;Ambulation   Functional mobility during ADLs: Supervision/safety       Vision  Baseline Vision/History: 0 No visual deficits       Perception     Praxis      Pertinent Vitals/Pain Pain Assessment Pain Assessment:  0-10 Pain Score: 4  Pain Location: Neck incision cite Pain Descriptors / Indicators: Aching Pain Intervention(s): Limited activity within patient's tolerance, Monitored during session, RN gave pain meds during session     Hand Dominance Right   Extremity/Trunk Assessment Upper Extremity Assessment Upper Extremity Assessment: RUE deficits/detail;LUE deficits/detail;Generalized weakness RUE Deficits / Details: unable to raise R UE >45 deg at shld height, can bend elbow and wrist. MMT 3- LUE Deficits / Details: minimal active movement due to paralysis from polio as an infant   Lower Extremity Assessment Lower Extremity Assessment: Defer to PT evaluation   Cervical / Trunk Assessment Cervical / Trunk Assessment: Neck Surgery   Communication Communication Communication: No difficulties   Cognition Arousal/Alertness: Awake/alert Behavior During Therapy: WFL for tasks assessed/performed Overall Cognitive Status: Within Functional Limits for tasks assessed                                       General Comments  VSS on RA, no c/o dizziness    Exercises     Shoulder Instructions      Home Living Family/patient expects to be discharged to:: Private residence Living Arrangements: Spouse/significant other Available Help at Discharge: Family;Friend(s);Available 24 hours/day Type of Home: House Home Access: Stairs to enter [employer] of Steps: 1 Entrance Stairs-Rails: None Home Layout: Multi-level (split level) Alternate Level Stairs-Number of Steps: 7 Alternate Level Stairs-Rails: Right Bathroom Shower/Tub: Producer, television/film/video: Standard     Home Equipment: Agricultural consultant (2 wheels);Shower seat - built in;Grab bars - tub/shower   Additional Comments: patient can stay on den level which is no STE and wouldn't need to go up stairs to bathroom      Prior Functioning/Environment Prior Level of Function : Independent/Modified  Independent;Driving             Mobility Comments: Pt golfs as a hobby, independent no AD. ADLs Comments: indep        OT Problem List: Decreased activity tolerance;Impaired balance (sitting and/or standing);Decreased strength;Pain      OT Treatment/Interventions:      OT Goals(Current goals can be found in the care plan section) Acute Rehab OT Goals Patient Stated Goal: to go home OT Goal Formulation: With patient Time For Goal Achievement: 04/26/23 Potential to Achieve Goals: Good  OT Frequency: Min 2X/week    Co-evaluation              AM-PAC OT "6 Clicks" Daily Activity     Outcome Measure Help from another person eating meals?: A Little Help from another person taking care of personal grooming?: A Little Help from another person toileting, which includes using toliet, bedpan, or urinal?: A Little Help from another person bathing (including washing, rinsing, drying)?: A Lot Help from another person to put on and taking off regular upper body clothing?: A Little Help from another person to put on and taking off regular lower body clothing?: A Lot 6 Click Score: 16   End of Session Equipment Utilized During Treatment: Gait belt Nurse Communication: Mobility status  Activity Tolerance: Patient tolerated treatment well Patient left: in bed;with family/visitor present  OT Visit Diagnosis: Muscle weakness (generalized) (M62.81);Pain Pain - part of body:  (neck)  Time: 1610-9604 OT Time Calculation (min): 36 min Charges:  OT General Charges $OT Visit: 1 Visit OT Evaluation $OT Eval Moderate Complexity: 1 Mod OT Treatments $Self Care/Home Management : 8-22 mins  04/12/2023  AB, OTR/L  Acute Rehabilitation Services  Office: [phone]   Tristan Schroeder 04/12/2023, 1:48 PM

## 2023-04-12 NOTE — TOC Transition Note (Signed)
Transition of Care [facility]) - CM/SW Discharge Note   Patient Details  Name: Jamire Macnab MRN: [id number] Date of Birth: 1946/03/04  [facility] CM/SW Contact:  Kermit Balo, RN Phone Number: 04/12/2023, 2:13 PM   Clinical Narrative:    Pt is discharging home with home health services through [facility]. Information provided to the patient.  Pt has transportation home and supervision at home.   Final next level of care: Home w Home Health Services Barriers to Discharge: No Barriers Identified   Patient Goals and CMS Choice CMS Medicare.gov Compare Post Acute Care list provided to:: Patient Choice offered to / list presented to : Spouse, Patient  Discharge Placement                         Discharge Plan and Services Additional resources added to the After Visit Summary for                            [facility] Arranged: PT, OT [facility] Agency: [employer] [facility] Services Date [facility] Contacted: 04/12/23   Representative spoke with at [facility] Agency: Elnita Maxwell  Social Determinants of Health (SDOH) Interventions SDOH Screenings   Food Insecurity: No Food Insecurity (02/28/2023)  Housing: Low Risk  (02/28/2023)  Transportation Needs: No Transportation Needs (02/28/2023)  Utilities: Not At Risk (02/28/2023)  Depression (PHQ2-9): Low Risk  (02/28/2023)  Tobacco Use: Low Risk  (04/11/2023)     Readmission Risk Interventions     No data to display

## 2023-04-12 NOTE — Plan of Care (Signed)
Pt doing well. Pt and wife given D/C instructions with verbal understanding. Rx's were sent to the pharmacy by MD. Pt's incision is clean and dry with no sign of infection. Pt's IV and drain were removed prior to D/C. Pt D/C'd home via wheelchair per MD order. Pt is stable @ D/C and has no other needs at this time. Halle Davlin, RN  

## 2023-04-12 NOTE — Discharge Summary (Signed)
Physician Discharge Summary  Patient ID: Hunter [name] MRN: [id number] DOB/AGE: 77-Jul-1947 77 y.o.  Admit date: 04/11/2023 Discharge date: 04/12/2023  Admission Diagnoses: Cervical spondylosis with myelopathy and radiculopathy C3-4 C4-5 C5-6 and C6-7 right cervical radiculopathy.  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  Right cervical radiculopathy.  History of polio involving the left upper extremity. Principal Problem:   Cervical myelopathy with cervical radiculopathy   Discharged Condition: fair  Hospital Course: Patient was admitted after surgical intervention to decompress for levels of the neck.  He tolerated surgery well but he had some weakness in the deltoid particularly on the right side is worse after surgery than before surgery.  His incision is clean and dry.  He is swallowing well.  He will start some physical therapy and Occupational Therapy at home to help with his recuperation.  Consults: None  Significant Diagnostic Studies: None  Treatments: surgery: See op note  Discharge Exam: Blood pressure 112/69, pulse 64, temperature 98.2 F (36.8 C), temperature source Oral, resp. rate 16, height  (1.753 m), weight 81.6 kg, SpO2 95 %. Vision is clean and dry Station and gait are intact right upper extremity weakness in the deltoid bicep and grip slightly worse than before surgery with deltoid function essentially being out.  Disposition: Discharge disposition: 01-Home or Self Care       Discharge Instructions     Call MD for:  redness, tenderness, or signs of infection (pain, swelling, redness, odor or green/yellow discharge around incision site)   Complete by: As directed    Call MD for:  severe uncontrolled pain   Complete by: As directed    Call MD for:  temperature >100.4   Complete by: As directed    Diet - low sodium heart healthy   Complete by: As directed    Discharge instructions    Complete by: As directed    Remove honeycomb dressing after shower.  May leave incision open to air after shower.   Incentive spirometry RT   Complete by: As directed    Increase activity slowly   Complete by: As directed       Allergies as of 04/12/2023       Reactions   Amiodarone    Hallucinations   Oxycodone Other (See Comments)   Bad dreams and hallucinations        Medication List     TAKE these medications    allopurinol 300 MG tablet Commonly known as: ZYLOPRIM Take 300 mg by mouth daily.   BIOFREEZE EX Apply 1 Application topically daily as needed (pain).   diclofenac Sodium 1 % Gel Commonly known as: VOLTAREN Apply 1 Application topically 4 (four) times daily as needed (pain).   ferrous sulfate 325 (65 FE) MG tablet Take 325 mg by mouth daily with breakfast.   fluticasone 50 MCG/ACT nasal spray Commonly known as: FLONASE 2 spray topically prior to emptying urostomy bag   ibuprofen 200 MG tablet Commonly known as: ADVIL Take 600 mg by mouth daily as needed for moderate pain.   methocarbamol 500 MG tablet Commonly known as: ROBAXIN Take 1 tablet (500 mg total) by mouth every 6 (six) hours as needed for muscle spasms.   methylPREDNISolone 4 MG Tbpk tablet Commonly known as: MEDROL DOSEPAK (Typical regimens for 21 tablet dose packs of Methylprednisolone , Prednisone , and Prednisone ) Day 1: 2 tabs before breakfast, 1 tab after lunch, 1 tab after supper, and 2 tabs at  bedtime. Day 2: 1 tab before breakfast, 1 tab after lunch, 1 tab after supper, and 2 tabs at bedtime. Day 3: 1 tab before breakfast, 1 tab after lunch, 1 tab after supper, and 1 tab at bedtime. Day 4: 1 tab before breakfast, 1 tab after lunch, and 1 tab at bedtime. Day 5: 1 tab before breakfast and 1 tab at bedtime. Day 6: 1 tab before breakfast.   metoprolol tartrate 50 MG tablet Commonly known as: LOPRESSOR Take 1 tablet (50 mg total) by mouth 2 (two) times daily. What  changed: how much to take   oxyCODONE-acetaminophen 5-325 MG tablet Commonly known as: PERCOCET/ROXICET Take 1 tablet by mouth every 4 (four) hours as needed for moderate pain or severe pain.   SALONPAS PAIN RELIEF PATCH EX Apply 1 patch topically daily as needed (pain).   simvastatin 40 MG tablet Commonly known as: ZOCOR TAKE 1 TABLET BY MOUTH EVERYDAY AT BEDTIME   traMADol 50 MG tablet Commonly known as: ULTRAM Take 50 mg by mouth every 6 (six) hours as needed for moderate pain.         Signed: Shary Key Hunter [name] 04/12/2023, 1:15 PM

## 2023-05-12 NOTE — Progress Notes (Unsigned)
Office Visit    Patient Name: Hunter [name] Date of Encounter: 05/13/2023  Primary Care Provider:  Garald Braver Primary Cardiologist:  Thurmon Fair, MD  Chief Complaint    77 year old male with a history of CAD s/p CABG x 4 (LIMA-LAD; SVG-OM1, OM2, SVG-PD) in 2013, paroxysmal atrial fibrillation, hypertension, hyperlipidemia, CKD stage IIIa, prediabetes, and bladder cancer who presents for follow-up related to CAD and atrial fibrillation.  Past Medical History     Past Surgical History:  Procedure Laterality Date   ANTERIOR CERVICAL DECOMPRESSION/DISCECTOMY FUSION 4 LEVELS N/A 04/11/2023   Procedure: Cervical three-four Cervical four-five Cervical five-six Cervical six-seven Anterior Cervical Decompression Fusion;  Surgeon: Barnett Abu, MD;  Location: [facility];  Service: Neurosurgery;  Laterality: N/A;   COLONOSCOPY     CORONARY ARTERY BYPASS GRAFT  12/12/2012   CABG x 4 using left internal mammary artery and right endovein harvest. (LIMA-LAD; VG-OM1, OM2, VG-PD)   CYSTOSCOPY WITH INJECTION N/A 02/04/2021   Procedure: CYSTOSCOPY WITH INJECTION OF INDOCYANINE GREEN DYE;  Surgeon: Sebastian Ache, MD;  Location: [facility];  Service: Urology;  Laterality: N/A;   EYE SURGERY  1996   had lasik surgery bilaterally   IR IMAGING GUIDED PORT INSERTION  09/10/2020   LEFT HEART CATHETERIZATION WITH CORONARY ANGIOGRAM N/A 12/08/2012   Procedure: LEFT HEART CATHETERIZATION WITH CORONARY ANGIOGRAM;  Surgeon: Thurmon Fair, MD;  Location: [facility];  Service: Cardiovascular;  Laterality: N/A;   LYMPHADENECTOMY Bilateral 02/04/2021   Procedure: LYMPHADENECTOMY;  Surgeon: Sebastian Ache, MD;  Location: [facility];  Service: Urology;  Laterality: Bilateral;   ROBOT ASSISTED LAPAROSCOPIC COMPLETE CYSTECT ILEAL CONDUIT N/A 02/04/2021   Procedure: XI ROBOTIC ASSISTED LAPAROSCOPIC COMPLETE CYSTOPROSTATECTOMY ILEAL CONDUIT;  Surgeon: Sebastian Ache, MD;  Location: [facility];  Service: Urology;   Laterality: N/A;  6 HRS   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   SKIN CANCER EXCISION  2016   left side of face    TRANSURETHRAL RESECTION OF BLADDER TUMOR N/A 11/16/2019   Procedure: TRANSURETHRAL RESECTION OF BLADDER TUMOR (TURBT) WITH INSTILLATION OF POST OPERATIVE CHEMOTHERAPY;  Surgeon: Ihor Gully, MD;  Location: [facility];  Service: Urology;  Laterality: N/A;   TRANSURETHRAL RESECTION OF BLADDER TUMOR WITH MITOMYCIN-C N/A 08/12/2020   Procedure: TRANSURETHRAL RESECTION OF BLADDER TUMOR WITH GEMCITABINE;  Surgeon: Noel Christmas, MD;  Location: [facility];  Service: Urology;  Laterality: N/A;  1 HR   UPPER GI ENDOSCOPY      Allergies  Allergies  Allergen Reactions   Amiodarone     Hallucinations    Oxycodone Other (See Comments)    Bad dreams and hallucinations     Labs/Other Studies Reviewed    The following studies were reviewed today:  Echo 10/2020: IMPRESSIONS     1. Left ventricular ejection fraction, by estimation, is 50%. The left  ventricle has low normal function with beat to beat variability. The left  ventricle has no regional wall motion abnormalities. There is mild left  ventricular hypertrophy. Left  ventricular diastolic parameters are indeterminate.   2. Right ventricular systolic function is normal. The right ventricular  size is normal. Tricuspid regurgitation signal is inadequate for assessing  PA pressure.   3. The mitral valve is normal in structure. Trivial mitral valve  regurgitation. No evidence of mitral stenosis.   4. The aortic valve is normal in structure. Aortic valve regurgitation is  not visualized. No aortic stenosis is present.   Zio 11/2020: 2 episodes of  NSVT, both lasting 7 beats 5 episodes of SVT, longest lasting 10 beats No atrial fibrillation   7 days of data recorded on Zio monitor. Patient had a min HR of 55 bpm, max HR of 203 bpm, and avg HR of 94 bpm. Predominant underlying rhythm was Sinus Rhythm. No atrial  fibrillation, high degree block, or pauses noted.  2 episodes of NSVT, both lasting 7 beats.  5 episodes of SVT, longest lasting 10 beats.  Isolated atrial and ventricular ectopy was rare (<1%). There were 0 triggered events.   ETT 2016: There was no ST segment deviation noted during stress. No T wave inversion was noted during stress. The patient reported shortness of breath during the stress test. The patient experienced no angina during the stress test. Duke Treadmill Score: low risk   Negative stress test without evidence of ischemia at given workload.   Recent Labs: 01/17/2023: ALT 21 04/08/2023: BUN 45; Creatinine, Ser 1.81; Hemoglobin 14.2; Platelets 273; Potassium 4.1; Sodium 138  Recent Lipid Panel    Component Value Date/Time   CHOL 128 09/26/2014 0921   TRIG 146 09/26/2014 0921   HDL 46 09/26/2014 0921   CHOLHDL 2.8 09/26/2014 0921   VLDL 29 09/26/2014 0921   LDLCALC 53 09/26/2014 0921   LDLDIRECT 83.9 10/26/2010 1056    History of Present Illness    77 year old male with the above past medical history including CAD s/p CABG x 4 (LIMA-LAD; SVG-OM1, OM2, SVG-PD) in 2013, paroxysmal atrial fibrillation, hypertension, hyperlipidemia, CKD stage IIIa, prediabetes, and bladder cancer.  He underwent bypass surgery in 2013, complicated by postop atrial fibrillation, with subsequent recurrence of atrial fibrillation during critical illness and electrolyte imbalances in November 2021.  ETT in 2016 was normal.  Most recent echocardiogram in 2021 showed EF 50%.  He was last seen in the office on 06/03/2021 and was stable overall from a cardiac standpoint.  He denies symptoms concerning for angina.  He was maintaining sinus rhythm.  He presents today for follow-up accompanied by his wife.  Since his last visit he has done well from a cardiac standpoint.  He denies any symptoms concerning for angina, denies palpitations or awareness of any breakthrough atrial fibrillation.  He notes that he  had anterior approach C-spine surgery in April 2024 he has had difficulty swallowing.  He has had decreased energy, decreased appetite/oral intake.  He is discouraged by his slow recovery following his recent surgery.    Home Medications    Current Outpatient Medications  Medication Sig Dispense Refill   allopurinol (ZYLOPRIM) 300 MG tablet Take 300 mg by mouth daily.     diclofenac Sodium (VOLTAREN) 1 % GEL Apply 1 Application topically 4 (four) times daily as needed (pain).     ferrous sulfate 325 (65 FE) MG tablet Take 325 mg by mouth daily with breakfast.     fluticasone (FLONASE) 50 MCG/ACT nasal spray 2 spray topically prior to emptying urostomy bag     ibuprofen (ADVIL) 200 MG tablet Take 600 mg by mouth daily as needed for moderate pain.     Menthol, Topical Analgesic, (BIOFREEZE EX) Apply 1 Application topically daily as needed (pain).     Menthol-Methyl Salicylate (SALONPAS PAIN RELIEF PATCH EX) Apply 1 patch topically daily as needed (pain).     methocarbamol (ROBAXIN) 500 MG tablet Take 1 tablet (500 mg total) by mouth every 6 (six) hours as needed for muscle spasms. 30 tablet 3   methylPREDNISolone (MEDROL DOSEPAK) 4 MG TBPK tablet (Typical  regimens for 21 tablet dose packs of Methylprednisolone 4mg , Prednisone 5mg , and Prednisone 10mg ) Day 1: 2 tabs before breakfast, 1 tab after lunch, 1 tab after supper, and 2 tabs at bedtime. Day 2: 1 tab before breakfast, 1 tab after lunch, 1 tab after supper, and 2 tabs at bedtime. Day 3: 1 tab before breakfast, 1 tab after lunch, 1 tab after supper, and 1 tab at bedtime. Day 4: 1 tab before breakfast, 1 tab after lunch, and 1 tab at bedtime. Day 5: 1 tab before breakfast and 1 tab at bedtime. Day 6: 1 tab before breakfast.     metoprolol tartrate (LOPRESSOR) 50 MG tablet Take 1 tablet (50 mg total) by mouth 2 (two) times daily. (Patient taking differently: Take 25 mg by mouth 2 (two) times daily.) 30 tablet 0   oxyCODONE-acetaminophen  (PERCOCET/ROXICET) 5-325 MG tablet Take 1 tablet by mouth every 4 (four) hours as needed for moderate pain or severe pain. 30 tablet 0   simvastatin (ZOCOR) 40 MG tablet TAKE 1 TABLET BY MOUTH EVERYDAY AT BEDTIME 90 tablet 3   traMADol (ULTRAM) 50 MG tablet Take 50 mg by mouth every 6 (six) hours as needed for moderate pain.     No current facility-administered medications for this visit.     Review of Systems    He denies chest pain, palpitations, dyspnea, pnd, orthopnea, n, v, dizziness, syncope, edema, weight gain, or early satiety. All other systems reviewed and are otherwise negative except as noted above.   Physical Exam    VS:  BP 102/70   Pulse 73   Ht 5\' 9"  (1.753 m)   Wt 176 lb 9.6 oz (80.1 kg)   SpO2 100%   BMI 26.08 kg/m   GEN: Well nourished, well developed, in no acute distress. HEENT: normal. Neck: Supple, no JVD, carotid bruits, or masses. Cardiac: RRR, no murmurs, rubs, or gallops. No clubbing, cyanosis, edema.  Radials/DP/PT 2+ and equal bilaterally.  Respiratory:  Respirations regular and unlabored, clear to auscultation bilaterally. GI: Soft, nontender, nondistended, BS + x 4. MS: no deformity or atrophy. Skin: warm and dry, no rash. Neuro:  Strength and sensation are intact. Psych: Normal affect.  Accessory Clinical Findings    ECG personally reviewed by me today -NSR, 73 bpm, LAD- no acute changes.   Lab Results  Component Value Date   WBC 10.5 04/08/2023   HGB 14.2 04/08/2023   HCT 43.4 04/08/2023   MCV 95.6 04/08/2023   PLT 273 04/08/2023   Lab Results  Component Value Date   CREATININE 1.81 (H) 04/08/2023   BUN 45 (H) 04/08/2023   NA 138 04/08/2023   K 4.1 04/08/2023   CL 108 04/08/2023   CO2 21 (L) 04/08/2023   Lab Results  Component Value Date   ALT 21 01/17/2023   AST 22 01/17/2023   ALKPHOS 94 01/17/2023   BILITOT 0.5 01/17/2023   Lab Results  Component Value Date   CHOL 128 09/26/2014   HDL 46 09/26/2014   LDLCALC 53  09/26/2014   LDLDIRECT 83.9 10/26/2010   TRIG 146 09/26/2014   CHOLHDL 2.8 09/26/2014    Lab Results  Component Value Date   HGBA1C 6.3 (H) 11/10/2020    Assessment & Plan    1. CAD: S/p CABG x 4 (LIMA-LAD; SVG-OM1, OM2, SVG-PD) in 2013. Stable with no anginal symptoms. No indication for ischemic evaluation.  He has had R arm pain, decreased energy, decreased oral intake in the setting of difficulty  swallowing following anterior approach c-spine surgery. Encouraged increased oral intake as tolerated, follow-up with neurosurgery. Continue metoprolol, simvastatin.   2. Paroxysmal atrial fibrillation: Maintaining NSR. Not on anticoagulation.   3. Hypertension: BP well controlled. Continue current antihypertensive regimen.   4 Hyperlipidemia: No recent LDL on file. Will update fasting lipid panel, CMET, CBC and hemoglobin A1c (labs will be drawn when he has his port flushed at any pen in early June).    5. CKD stage IIIa: Creatinine was stable at 1.81 in 03/2023.  6. Prediabetes: No recent A1c on file. Will update.   7. Bladder cancer: Following with oncology.   8. Disposition: Follow-up in 6 months.       Joylene Grapes, NP 05/13/2023, 11:06 AM

## 2023-05-13 ENCOUNTER — Ambulatory Visit: Payer: Medicare Other | Attending: Nurse Practitioner | Admitting: Nurse Practitioner

## 2023-05-13 VITALS — BP 102/70 | HR 73 | Ht 69.0 in | Wt 176.6 lb

## 2023-05-13 DIAGNOSIS — I2510 Atherosclerotic heart disease of native coronary artery without angina pectoris: Secondary | ICD-10-CM | POA: Diagnosis present

## 2023-05-13 DIAGNOSIS — E785 Hyperlipidemia, unspecified: Secondary | ICD-10-CM | POA: Insufficient documentation

## 2023-05-13 DIAGNOSIS — I480 Paroxysmal atrial fibrillation: Secondary | ICD-10-CM | POA: Insufficient documentation

## 2023-05-13 DIAGNOSIS — R7303 Prediabetes: Secondary | ICD-10-CM | POA: Insufficient documentation

## 2023-05-13 DIAGNOSIS — N1831 Chronic kidney disease, stage 3a: Secondary | ICD-10-CM

## 2023-05-13 DIAGNOSIS — I10 Essential (primary) hypertension: Secondary | ICD-10-CM | POA: Insufficient documentation

## 2023-05-13 MED ORDER — METOPROLOL TARTRATE 50 MG PO TABS: 50.0000 mg | ORAL_TABLET | Freq: Two times a day (BID) | ORAL | 3 refills | Status: DC

## 2023-05-13 NOTE — Patient Instructions (Signed)
Medication Instructions:  Your physician recommends that you continue on your current medications as directed. Please refer to the Current Medication list given to you today.  *If you need a refill on your cardiac medications before your next appointment, please call your pharmacy*   Lab Work: CBC, CMET, A1C, Fasting lipid panel at your convenience.   If you have labs (blood work) drawn today and your tests are completely normal, you will receive your results only by: MyChart Message (if you have MyChart) OR A paper copy in the mail If you have any lab test that is abnormal or we need to change your treatment, we will call you to review the results.   Testing/Procedures: NONE ordered at this time of appointment    Follow-Up: At [facility], you and your health needs are our priority.  As part of our continuing mission to provide you with exceptional heart care, we have created designated Provider Care Teams.  These Care Teams include your primary Cardiologist (physician) and Advanced Practice Providers (APPs -  Physician Assistants and Nurse Practitioners) who all work together to provide you with the care you need, when you need it.  We recommend signing up for the patient portal called "MyChart".  Sign up information is provided on this After Visit Summary.  MyChart is used to connect with patients for Virtual Visits (Telemedicine).  Patients are able to view lab/test results, encounter notes, upcoming appointments, etc.  Non-urgent messages can be sent to your provider as well.   To learn more about what you can do with MyChart, go to [email or web address].    Your next appointment:   6 month(s)  Provider:   [name], NP        Other Instructions

## 2023-05-15 ENCOUNTER — Encounter: Payer: Self-pay | Admitting: Nurse Practitioner

## 2023-05-30 ENCOUNTER — Telehealth: Payer: Self-pay

## 2023-05-30 ENCOUNTER — Inpatient Hospital Stay: Payer: Medicare Other | Attending: Oncology

## 2023-05-30 ENCOUNTER — Other Ambulatory Visit: Payer: Self-pay | Admitting: *Deleted

## 2023-05-30 VITALS — BP 108/70 | HR 60 | Temp 97.7°F | Resp 18 | Wt 178.0 lb

## 2023-05-30 DIAGNOSIS — E785 Hyperlipidemia, unspecified: Secondary | ICD-10-CM | POA: Diagnosis not present

## 2023-05-30 DIAGNOSIS — Z8551 Personal history of malignant neoplasm of bladder: Secondary | ICD-10-CM | POA: Diagnosis present

## 2023-05-30 DIAGNOSIS — E7800 Pure hypercholesterolemia, unspecified: Secondary | ICD-10-CM

## 2023-05-30 DIAGNOSIS — Z95828 Presence of other vascular implants and grafts: Secondary | ICD-10-CM

## 2023-05-30 LAB — LIPID PANEL
Cholesterol: 120 mg/dL (ref 0–200)
HDL: 42 mg/dL (ref >40)
LDL Cholesterol: 62 mg/dL (ref 0–99)
Total CHOL/HDL Ratio: 2.9 RATIO
Triglycerides: 81 mg/dL (ref <150)
VLDL: 16 mg/dL (ref 0–40)

## 2023-05-30 MED ORDER — SODIUM CHLORIDE 0.9% FLUSH
10.0000 mL | Freq: Once | INTRAVENOUS | Status: AC
  Administered 2023-05-30: 10 mL via INTRAVENOUS

## 2023-05-30 MED ORDER — HEPARIN SOD (PORK) LOCK FLUSH 100 UNIT/ML IV SOLN
500.0000 [IU] | Freq: Once | INTRAVENOUS | Status: AC
  Administered 2023-05-30: 500 [IU] via INTRAVENOUS

## 2023-05-30 NOTE — Patient Instructions (Signed)
[facility]  Discharge Instructions: Thank you for choosing [facility] to provide your oncology and hematology care.  If you have a lab appointment with the Cancer Center - please note that after April 8th, 2024, all labs will be drawn in the cancer center.  You do not have to check in or register with the main entrance as you have in the past but will complete your check-in in the cancer center.  Wear comfortable clothing and clothing appropriate for easy access to any Portacath or PICC line.   We strive to give you quality time with your provider. You may need to reschedule your appointment if you arrive late (15 or more minutes).  Arriving late affects you and other patients whose appointments are after yours.  Also, if you miss three or more appointments without notifying the office, you may be dismissed from the clinic at the provider's discretion.      For prescription refill requests, have your pharmacy contact our office and allow 72 hours for refills to be completed.    Today you received the following port flush with lab draw, return as scheduled.    To help prevent nausea and vomiting after your treatment, we encourage you to take your nausea medication as directed.  BELOW ARE SYMPTOMS THAT SHOULD BE REPORTED IMMEDIATELY: *FEVER GREATER THAN 100.4 F (38 C) OR HIGHER *CHILLS OR SWEATING *NAUSEA AND VOMITING THAT IS NOT CONTROLLED WITH YOUR NAUSEA MEDICATION *UNUSUAL SHORTNESS OF BREATH *UNUSUAL BRUISING OR BLEEDING *URINARY PROBLEMS (pain or burning when urinating, or frequent urination) *BOWEL PROBLEMS (unusual diarrhea, constipation, pain near the anus) TENDERNESS IN MOUTH AND THROAT WITH OR WITHOUT PRESENCE OF ULCERS (sore throat, sores in mouth, or a toothache) UNUSUAL RASH, SWELLING OR PAIN  UNUSUAL VAGINAL DISCHARGE OR ITCHING   Items with * indicate a potential emergency and should be followed up as soon as possible or go to the  Emergency Department if any problems should occur.  Please show the CHEMOTHERAPY ALERT CARD or IMMUNOTHERAPY ALERT CARD at check-in to the Emergency Department and triage nurse.  Should you have questions after your visit or need to cancel or reschedule your appointment, please contact [facility] AT  [phone]  and follow the prompts.  Office hours are 8:00 a.m. to 4:30 p.m. Monday - Friday. Please note that voicemails left after 4:00 p.m. may not be returned until the following business day.  We are closed weekends and major holidays. You have access to a nurse at all times for urgent questions. Please call the main number to the clinic [phone] and follow the prompts.  For any non-urgent questions, you may also contact your provider using MyChart. We now offer e-Visits for anyone 18 and older to request care online for non-urgent symptoms. For details visit [email or web address].   Also download the MyChart app! Go to the app store, search "MyChart", open the app, select [facility], and log in with your MyChart username and password.   

## 2023-05-30 NOTE — Progress Notes (Signed)
Port flushed with good blood return noted. No bruising or swelling at site. Bandaid applied and patient discharged in satisfactory condition. VVS stable with no signs or symptoms of distressed noted. 

## 2023-05-30 NOTE — Telephone Encounter (Signed)
Spoke with pt. Pt was notified of lab results. Pt will continue is current medication and f/u as planned.

## 2023-06-15 ENCOUNTER — Other Ambulatory Visit (HOSPITAL_COMMUNITY): Payer: Self-pay | Admitting: Neurological Surgery

## 2023-06-15 ENCOUNTER — Other Ambulatory Visit: Payer: Self-pay | Admitting: Neurological Surgery

## 2023-06-15 DIAGNOSIS — M5412 Radiculopathy, cervical region: Secondary | ICD-10-CM

## 2023-07-06 ENCOUNTER — Ambulatory Visit (HOSPITAL_COMMUNITY)
Admission: RE | Admit: 2023-07-06 | Discharge: 2023-07-06 | Disposition: A | Discharge status: Home / Self Care | Payer: Medicare Other | Source: Ambulatory Visit | Attending: Neurological Surgery | Admitting: Neurological Surgery

## 2023-07-06 DIAGNOSIS — Z8612 Personal history of poliomyelitis: Secondary | ICD-10-CM | POA: Diagnosis not present

## 2023-07-06 DIAGNOSIS — M4722 Other spondylosis with radiculopathy, cervical region: Secondary | ICD-10-CM | POA: Insufficient documentation

## 2023-07-06 DIAGNOSIS — M5412 Radiculopathy, cervical region: Secondary | ICD-10-CM | POA: Diagnosis present

## 2023-07-06 DIAGNOSIS — M4802 Spinal stenosis, cervical region: Secondary | ICD-10-CM | POA: Diagnosis not present

## 2023-07-06 MED ORDER — DIAZEPAM 5 MG PO TABS
ORAL_TABLET | ORAL | Status: AC
  Administered 2023-07-06: 5 mg via ORAL
  Filled 2023-07-06: qty 1

## 2023-07-06 MED ORDER — LIDOCAINE 1 % OPTIME INJ - NO CHARGE
5.0000 mL | Freq: Once | INTRAMUSCULAR | Status: AC
  Administered 2023-07-06: 5 mL via INTRADERMAL
  Filled 2023-07-06: qty 6

## 2023-07-06 MED ORDER — ONDANSETRON HCL 4 MG/2ML IJ SOLN: 4.0000 mg | Freq: Four times a day (QID) | INTRAMUSCULAR | Status: DC | PRN

## 2023-07-06 MED ORDER — DIAZEPAM 5 MG PO TABS: 5.0000 mg | ORAL_TABLET | Freq: Once | ORAL | Status: AC

## 2023-07-06 MED ORDER — IOHEXOL 300 MG/ML  SOLN
10.0000 mL | Freq: Once | INTRAMUSCULAR | Status: AC | PRN
  Administered 2023-07-06: 7 mL via INTRATHECAL

## 2023-07-06 NOTE — Procedures (Signed)
Mr. Krosby Ritchie is a 77 year old individual who has developed significant numbness tingling and weakness in his upper extremities.  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  He had anterior cervical decompression approximately 3 months ago.  Postoperatively he has had weakness in the right deltoid which has been much more severe than it was preoperatively.  For further evaluation of this process a myelogram has been suggested and he is now undergoing this procedure.  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 Post op Dx: Same Procedure: Cervical myelogram Surgeon: Makhya Arave Puncture level: L2-3 Fluid color: Clear colorless Injection: Iohexol 300, 7 mL. Findings: Amply patent spinal canal from occiput to T1.  No obvious nerve root cut off.  Further evaluation with CT scanning.

## 2023-09-04 NOTE — Progress Notes (Signed)
[facility] [address] [place] [address]    Clinic Day:  09/05/2023  Referring physician: Garald Braver, FNP  Patient Care Team: Garald Braver as PCP - General (Nurse Practitioner) Thurmon Fair, MD as PCP - Cardiology (Cardiology) Thurmon Fair, MD as Attending Physician (Cardiology) Mayo, Eugenio Hoes, FNP as WOC Nurse (Nurse Practitioner) Doreatha Massed, MD as Medical Oncologist (Medical Oncology)   ASSESSMENT & PLAN:   Assessment: 1.  T2 N0 high-grade urothelial bladder cancer: - TURBT in August 2021 - Neoadjuvant chemotherapy with cisplatin and gemcitabine from 09/17/2020 through November 2021, 3 cycles - Radical cystoprostatectomy and ileal conduit formation and lymph node dissection on 02/04/2021.  Pathology Y pTis ypN0   2.  Social/family history: - He lives at home with his wife.  He plays golf twice weekly.  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  Non-smoker. - Mother had colon cancer in her 36s.   3.  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  All margins of resection are negative for carcinoma.    Plan: 1.  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 Berneice Heinrich which did not show any lesions in the urethra. - He reportedly had MRI of the spine done in January which did not show any evidence of recurrence of tumor. - He reportedly had neck surgery done for right arm pain on 04/11/2023 and has some weakness in the right forearm. - Recommend follow-up in 6 months.  He will have a CT scan done with Dr. Berneice Heinrich along with urethroscopy.   2.  Prostate cancer: - PSA from 02/01/2022 was less than 0.015.  Will send PSA level today.   3.  Chronic renal insufficiency: - Creatinine is stable at 1.53.   4.  Normocytic anemia: - He has mild normocytic anemia from CKD and  functional iron deficiency.  Ferritin today is 64 and percent saturation 24.  Hemoglobin is 12.1. - Recommend starting iron tablet 3 times weekly, on Monday Wednesday and Friday.   5.  Port-A-Cath: - Continue port flushes every 3 months.    Orders Placed This Encounter  Procedures   CT CHEST ABDOMEN PELVIS W CONTRAST    Standing Status:   Future    Standing Expiration Date:   09/04/2024    Order Specific Question:   If indicated for the ordered procedure, I authorize the administration of contrast media per Radiology protocol    Answer:   Yes    Order Specific Question:   Does the patient have a contrast media/X-ray dye allergy?    Answer:   No    Order Specific Question:   Preferred imaging location?    Answer:   [facility]    Order Specific Question:   If indicated for the ordered procedure, I authorize the administration of oral contrast media per Radiology protocol    Answer:   Yes   CBC with Differential    Standing Status:   Future    Standing Expiration Date:   09/04/2024   Comprehensive metabolic panel    Standing Status:   Future    Standing Expiration Date:   09/04/2024   Iron and TIBC ([facility] ONLY)    Standing Status:   Future    Standing Expiration Date:   09/04/2024   Ferritin    Standing Status:  Future    Standing Expiration Date:   09/04/2024      I,Katie Daubenspeck,acting as a scribe for Doreatha Massed, MD.,have documented all relevant documentation on the behalf of Doreatha Massed, MD,as directed by  Doreatha Massed, MD while in the presence of Doreatha Massed, MD.   I, Doreatha Massed MD, have reviewed the above documentation for accuracy and completeness, and I agree with the above.   Doreatha Massed, MD   9/9/202412:07 PM  CHIEF COMPLAINT:   Diagnosis: bladder cancer    Cancer Staging  No matching staging information was found for the patient.    Prior Therapy: 1. TURBT, 07/2020 2. Neoadjuvant  cisplatin and gemcitabine, 3 cycles 09/17/20 - 10/2020 3. Cystoprostatectomy 02/04/21  Current Therapy:  Surveillance    HISTORY OF PRESENT ILLNESS:   Oncology History  Malignant neoplasm of urinary bladder (HCC)  09/03/2020 Initial Diagnosis   Malignant neoplasm of urinary bladder (HCC)   09/17/2020 - 11/05/2020 Chemotherapy   The patient had palonosetron (ALOXI) injection 0.25 mg, 0.25 mg, Intravenous,  Once, 3 of 3 cycles Administration: 0.25 mg (09/17/2020), 0.25 mg (10/07/2020), 0.25 mg (10/29/2020) CISplatin (PLATINOL) 100 mg in sodium chloride 0.9 % 250 mL chemo infusion, 49.5 mg/m2 = 101 mg (100 % of original dose 50 mg/m2), Intravenous,  Once, 3 of 3 cycles Dose modification: 50 mg/m2 (original dose 50 mg/m2, Cycle 1, Reason: Provider Judgment) Administration: 100 mg (09/17/2020), 100 mg (10/29/2020) gemcitabine (GEMZAR) 2,000 mg in sodium chloride 0.9 % 250 mL chemo infusion, 2,014 mg (100 % of original dose 1,000 mg/m2), Intravenous,  Once, 3 of 3 cycles Dose modification: 1,000 mg/m2 (original dose 1,000 mg/m2, Cycle 1, Reason: Provider Judgment), 800 mg/m2 (original dose 1,000 mg/m2, Cycle 2, Reason: Provider Judgment) Administration: 2,000 mg (09/17/2020), 2,000 mg (09/23/2020), 1,634 mg (10/07/2020), 1,634 mg (10/15/2020), 1,634 mg (10/29/2020), 1,634 mg (11/05/2020) fosaprepitant (EMEND) 150 mg in sodium chloride 0.9 % 145 mL IVPB, 150 mg, Intravenous,  Once, 3 of 3 cycles Administration: 150 mg (09/17/2020), 150 mg (10/07/2020), 150 mg (10/29/2020)  for chemotherapy treatment.       INTERVAL HISTORY:   Hunter [name] is a 77 y.o. male presenting to clinic today for follow up of bladder cancer. He was last seen by me on 02/28/23.  Today, he states that he is doing well overall. His appetite level is at 100%. His energy level is at 70 %.  PAST MEDICAL HISTORY:   Past Medical History: Past Medical History:  Diagnosis Date   Atrial fibrillation, rapid (HCC)    With high ventricular rates.  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 (LIMA-LAD; VG-OM1, OM2, VG-PD), EF 55-60% at cath   Degenerative disc disease, lumbar    Epigastric pain    Myoview 11/09/12 Showed evidence of reversible ischemia in both the inferior wall & the anteroapical distribution.    Erectile dysfunction    H/O ED while taking a beta-blocker   GERD (gastroesophageal reflux disease)    Gout    Hiatal hernia    Hyperlipidemia    Hypertension    Systemic HTN. Pt has H/O of ED when taking a beta-blocker. ECHO 11/26/08 Showed no evidence of any significant valvular disease, Estimated EF = 50-60%.   Kidney stone    " i have  6-7 stones imbedded in  my left kidney "    Mononeuritis of unspecified site    Nocturia    NSVT (nonsustained ventricular tachycardia) (HCC)    Obesity    Osteoarthritis 08/2019   right knee; cortisone shot    PAF (paroxysmal atrial fibrillation) (HCC)    Post CABG. Amia intol. 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, LIMA-LAD;VG-OM1,OM2;VG-PD 12/15/2012   Seasonal allergies    Skin cancer (melanoma) (HCC) 08/04/2015   left side of face    Skin lesion of face    hx of    Surgical History: Past Surgical History:  Procedure Laterality Date   ANTERIOR CERVICAL DECOMPRESSION/DISCECTOMY FUSION 4 LEVELS N/A 04/11/2023   Procedure: Cervical three-four Cervical four-five Cervical five-six Cervical six-seven Anterior Cervical Decompression Fusion;  Surgeon: Barnett Abu, MD;  Location: [facility] OR;  Service: Neurosurgery;  Laterality: N/A;   COLONOSCOPY     CORONARY ARTERY BYPASS GRAFT  12/12/2012   CABG x 4 using left internal mammary artery and right endovein harvest. (LIMA-LAD; VG-OM1, OM2, VG-PD)   CYSTOSCOPY  WITH INJECTION N/A 02/04/2021   Procedure: CYSTOSCOPY WITH INJECTION OF INDOCYANINE GREEN DYE;  Surgeon: Sebastian Ache, MD;  Location: [facility] ORS;  Service: Urology;  Laterality: N/A;   EYE SURGERY  1996   had lasik surgery bilaterally   IR IMAGING GUIDED PORT INSERTION  09/10/2020   LEFT HEART CATHETERIZATION WITH CORONARY ANGIOGRAM N/A 12/08/2012   Procedure: LEFT HEART CATHETERIZATION WITH CORONARY ANGIOGRAM;  Surgeon: Thurmon Fair, MD;  Location: [facility] CATH LAB;  Service: Cardiovascular;  Laterality: N/A;   LYMPHADENECTOMY Bilateral 02/04/2021   Procedure: LYMPHADENECTOMY;  Surgeon: Sebastian Ache, MD;  Location: [facility];  Service: Urology;  Laterality: Bilateral;   ROBOT ASSISTED LAPAROSCOPIC COMPLETE CYSTECT ILEAL CONDUIT N/A 02/04/2021   Procedure: XI ROBOTIC ASSISTED LAPAROSCOPIC COMPLETE CYSTOPROSTATECTOMY ILEAL CONDUIT;  Surgeon: Sebastian Ache, MD;  Location: [facility];  Service: Urology;  Laterality: N/A;  6 HRS   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   SKIN CANCER EXCISION  2016   left side of face    TRANSURETHRAL RESECTION OF BLADDER TUMOR N/A 11/16/2019   Procedure: TRANSURETHRAL RESECTION OF BLADDER TUMOR (TURBT) WITH INSTILLATION OF POST OPERATIVE CHEMOTHERAPY;  Surgeon: Ihor Gully, MD;  Location: [facility];  Service: Urology;  Laterality: N/A;   TRANSURETHRAL RESECTION OF BLADDER TUMOR WITH MITOMYCIN-C N/A 08/12/2020   Procedure: TRANSURETHRAL RESECTION OF BLADDER TUMOR WITH GEMCITABINE;  Surgeon: Noel Christmas, MD;  Location: [facility];  Service: Urology;  Laterality: N/A;  1 HR   UPPER GI ENDOSCOPY      Social History: Social History   Socioeconomic History   Marital status: Married    Spouse name: Not on file   Number of children: 3   Years of education: Not on file   Highest education level: Not on file  Occupational History   Occupation: Musician (RETIRED)    Employer: [employer]  Tobacco Use   Smoking status: Never   Smokeless tobacco: Never   Vaping Use   Vaping status: Never Used  Substance and Sexual Activity   Alcohol use: Yes    Comment: 1 wine or 1 margarita once a month   Drug use: No   Sexual activity: Not on file  Other Topics Concern   Not on file  Social History Narrative   Not  on file   Social Determinants of Health   Financial Resource Strain: Not on file  Food Insecurity: No Food Insecurity (02/28/2023)   Hunger Vital Sign    Worried About Running Out of Food in the Last Year: Never true    Ran Out of Food in the Last Year: Never true  Transportation Needs: No Transportation Needs (02/28/2023)   PRAPARE - Administrator, Civil Service (Medical): No    Lack of Transportation (Non-Medical): No  Physical Activity: Not on file  Stress: Not on file  Social Connections: Not on file  Intimate Partner Violence: Not At Risk (02/28/2023)   Humiliation, Afraid, Rape, and Kick questionnaire    Fear of Current or Ex-Partner: No    Emotionally Abused: No    Physically Abused: No    Sexually Abused: No    Family History: Family History  Problem Relation Age of Onset   CAD Father    Hypertension Father    AAA (abdominal aortic aneurysm) Father    Aneurysm Father        Cause of death   Heart attack Paternal Grandfather    Cancer Mother        Colon. Cause of death    Current Medications:  Current Outpatient Medications:    allopurinol (ZYLOPRIM) 300 MG tablet, Take 300 mg by mouth daily., Disp: , Rfl:    diclofenac Sodium (VOLTAREN) 1 % GEL, Apply 1 Application topically 4 (four) times daily as needed (pain)., Disp: , Rfl:    ferrous sulfate 325 (65 FE) MG tablet, Take 325 mg by mouth daily with breakfast., Disp: , Rfl:    fluticasone (FLONASE) 50 MCG/ACT nasal spray, 2 spray topically prior to emptying urostomy bag, Disp: , Rfl:    ibuprofen (ADVIL) 200 MG tablet, Take 600 mg by mouth daily as needed for moderate pain., Disp: , Rfl:    Menthol, Topical Analgesic, (BIOFREEZE EX), Apply 1  Application topically daily as needed (pain)., Disp: , Rfl:    Menthol-Methyl Salicylate (SALONPAS PAIN RELIEF PATCH EX), Apply 1 patch topically daily as needed (pain)., Disp: , Rfl:    methocarbamol (ROBAXIN) 500 MG tablet, Take 1 tablet (500 mg total) by mouth every 6 (six) hours as needed for muscle spasms., Disp: 30 tablet, Rfl: 3   metoprolol tartrate (LOPRESSOR) 50 MG tablet, Take 1 tablet (50 mg total) by mouth 2 (two) times daily., Disp: 180 tablet, Rfl: 3   simvastatin (ZOCOR) 40 MG tablet, TAKE 1 TABLET BY MOUTH EVERYDAY AT BEDTIME, Disp: 90 tablet, Rfl: 3   Allergies: Allergies  Allergen Reactions   Amiodarone     Hallucinations    Oxycodone Other (See Comments)    Bad dreams and hallucinations    REVIEW OF SYSTEMS:   Review of Systems  Constitutional:  Negative for chills, fatigue and fever.  HENT:   Negative for lump/mass, mouth sores, nosebleeds, sore throat and trouble swallowing.   Eyes:  Negative for eye problems.  Respiratory:  Negative for cough and shortness of breath.   Cardiovascular:  Negative for chest pain, leg swelling and palpitations.  Gastrointestinal:  Negative for abdominal pain, constipation, diarrhea, nausea and vomiting.  Genitourinary:  Negative for bladder incontinence, difficulty urinating, dysuria, frequency, hematuria and nocturia.   Musculoskeletal:  Negative for arthralgias, back pain, flank pain, myalgias and neck pain.  Skin:  Negative for itching and rash.  Neurological:  Positive for dizziness. Negative for headaches and numbness.  Hematological:  Does not bruise/bleed easily.  Psychiatric/Behavioral:  Negative for depression, sleep disturbance and suicidal ideas. The patient is not nervous/anxious.   All other systems reviewed and are negative.    VITALS:   There were no vitals taken for this visit.  Wt Readings from Last 3 Encounters:  09/05/23 181 lb 6.4 oz (82.3 kg)  07/06/23 177 lb (80.3 kg)  05/30/23 178 lb (80.7 kg)     There is no height or weight on file to calculate BMI.  Performance status (ECOG): 1 - Symptomatic but completely ambulatory  PHYSICAL EXAM:   Physical Exam Vitals and nursing note reviewed. Exam conducted with a chaperone present.  Constitutional:      Appearance: Normal appearance.  Cardiovascular:     Rate and Rhythm: Normal rate and regular rhythm.     Pulses: Normal pulses.     Heart sounds: Normal heart sounds.  Pulmonary:     Effort: Pulmonary effort is normal.     Breath sounds: Normal breath sounds.  Abdominal:     Palpations: Abdomen is soft. There is no hepatomegaly, splenomegaly or mass.     Tenderness: There is no abdominal tenderness.  Musculoskeletal:     Right lower leg: No edema.     Left lower leg: No edema.  Lymphadenopathy:     Cervical: No cervical adenopathy.     Right cervical: No superficial, deep or posterior cervical adenopathy.    Left cervical: No superficial, deep or posterior cervical adenopathy.     Upper Body:     Right upper body: No supraclavicular or axillary adenopathy.     Left upper body: No supraclavicular or axillary adenopathy.  Neurological:     General: No focal deficit present.     Mental Status: He is alert and oriented to person, place, and time.  Psychiatric:        Mood and Affect: Mood normal.        Behavior: Behavior normal.     LABS:      Latest Ref Rng & Units 09/05/2023   10:32 AM 04/08/2023   11:55 AM 09/10/2022    2:29 PM  CBC  WBC 4.0 - 10.5 K/uL 7.0  10.5  7.8   Hemoglobin 13.0 - 17.0 g/dL 95.2  84.1  32.4   Hematocrit 39.0 - 52.0 % 37.5  43.4  36.9   Platelets 150 - 400 K/uL 169  273  137       Latest Ref Rng & Units 09/05/2023   10:32 AM 04/08/2023   11:55 AM 01/17/2023   11:53 AM  CMP  Glucose 70 - 99 mg/dL 401  98  93   BUN 8 - 23 mg/dL 27  45  28   Creatinine 0.61 - 1.24 mg/dL 0.27  2.53  6.64   Sodium 135 - 145 mmol/L 134  138  137   Potassium 3.5 - 5.1 mmol/L 3.6  4.1  3.8   Chloride 98 - 111  mmol/L 107  108  108   CO2 22 - 32 mmol/L 22  21  23    Calcium 8.9 - 10.3 mg/dL 8.4  8.8  9.0   Total Protein 6.5 - 8.1 g/dL 6.3   6.3   Total Bilirubin 0.3 - 1.2 mg/dL 0.6   0.5   Alkaline Phos 38 - 126 U/L 97   94   AST 15 - 41 U/L 19   22   ALT 0 - 44 U/L 16   21      No results  found for: "CEA1", "CEA" / No results found for: "CEA1", "CEA" Lab Results  Component Value Date   PSA1 <0.1 01/17/2023   No results found for: "WUX324" No results found for: "CAN125"  No results found for: "TOTALPROTELP", "ALBUMINELP", "A1GS", "A2GS", "BETS", "BETA2SER", "GAMS", "MSPIKE", "SPEI" Lab Results  Component Value Date   TIBC 324 09/05/2023   TIBC 186 (L) 11/12/2020   FERRITIN 64 09/05/2023   FERRITIN >1,500 (H) 11/12/2020   IRONPCTSAT 24 09/05/2023   IRONPCTSAT 44 (H) 11/12/2020   No results found for: "LDH"   STUDIES:   No results found.

## 2023-09-05 ENCOUNTER — Inpatient Hospital Stay (HOSPITAL_BASED_OUTPATIENT_CLINIC_OR_DEPARTMENT_OTHER): Payer: Medicare Other | Admitting: Hematology

## 2023-09-05 ENCOUNTER — Telehealth: Payer: Self-pay | Admitting: *Deleted

## 2023-09-05 ENCOUNTER — Inpatient Hospital Stay: Payer: Medicare Other | Attending: Oncology

## 2023-09-05 DIAGNOSIS — C61 Malignant neoplasm of prostate: Secondary | ICD-10-CM | POA: Insufficient documentation

## 2023-09-05 DIAGNOSIS — C679 Malignant neoplasm of bladder, unspecified: Secondary | ICD-10-CM

## 2023-09-05 DIAGNOSIS — C675 Malignant neoplasm of bladder neck: Secondary | ICD-10-CM

## 2023-09-05 DIAGNOSIS — D508 Other iron deficiency anemias: Secondary | ICD-10-CM

## 2023-09-05 DIAGNOSIS — D631 Anemia in chronic kidney disease: Secondary | ICD-10-CM | POA: Insufficient documentation

## 2023-09-05 DIAGNOSIS — N189 Chronic kidney disease, unspecified: Secondary | ICD-10-CM | POA: Diagnosis not present

## 2023-09-05 LAB — CBC WITH DIFFERENTIAL/PLATELET
Abs Immature Granulocytes: 0.02 10*3/uL (ref 0.00–0.07)
Basophils Absolute: 0.1 10*3/uL (ref 0.0–0.1)
Basophils Relative: 1 %
Eosinophils Absolute: 0.3 10*3/uL (ref 0.0–0.5)
Eosinophils Relative: 4 %
HCT: 37.5 % — ABNORMAL LOW (ref 39.0–52.0)
Hemoglobin: 12.1 g/dL — ABNORMAL LOW (ref 13.0–17.0)
Immature Granulocytes: 0 %
Lymphocytes Relative: 14 %
Lymphs Abs: 1 10*3/uL (ref 0.7–4.0)
MCH: 30.8 pg (ref 26.0–34.0)
MCHC: 32.3 g/dL (ref 30.0–36.0)
MCV: 95.4 fL (ref 80.0–100.0)
Monocytes Absolute: 0.4 10*3/uL (ref 0.1–1.0)
Monocytes Relative: 6 %
Neutro Abs: 5.3 10*3/uL (ref 1.7–7.7)
Neutrophils Relative %: 75 %
Platelets: 169 10*3/uL (ref 150–400)
RBC: 3.93 MIL/uL — ABNORMAL LOW (ref 4.22–5.81)
RDW: 14.5 % (ref 11.5–15.5)
WBC: 7 10*3/uL (ref 4.0–10.5)
nRBC: 0 % (ref 0.0–0.2)

## 2023-09-05 LAB — IRON AND TIBC
Iron: 77 ug/dL (ref 45–182)
Saturation Ratios: 24 % (ref 17.9–39.5)
TIBC: 324 ug/dL (ref 250–450)
UIBC: 247 ug/dL

## 2023-09-05 LAB — COMPREHENSIVE METABOLIC PANEL
ALT: 16 U/L (ref 0–44)
AST: 19 U/L (ref 15–41)
Albumin: 3.4 g/dL — ABNORMAL LOW (ref 3.5–5.0)
Alkaline Phosphatase: 97 U/L (ref 38–126)
Anion gap: 5 (ref 5–15)
BUN: 27 mg/dL — ABNORMAL HIGH (ref 8–23)
CO2: 22 mmol/L (ref 22–32)
Calcium: 8.4 mg/dL — ABNORMAL LOW (ref 8.9–10.3)
Chloride: 107 mmol/L (ref 98–111)
Creatinine, Ser: 1.53 mg/dL — ABNORMAL HIGH (ref 0.61–1.24)
GFR, Estimated: 47 mL/min — ABNORMAL LOW (ref >60)
Glucose, Bld: 104 mg/dL — ABNORMAL HIGH (ref 70–99)
Potassium: 3.6 mmol/L (ref 3.5–5.1)
Sodium: 134 mmol/L — ABNORMAL LOW (ref 135–145)
Total Bilirubin: 0.6 mg/dL (ref 0.3–1.2)
Total Protein: 6.3 g/dL — ABNORMAL LOW (ref 6.5–8.1)

## 2023-09-05 LAB — PSA: Prostatic Specific Antigen: <0.01 ng/mL (ref 0.00–4.00)

## 2023-09-05 LAB — FERRITIN: Ferritin: 64 ng/mL (ref 24–336)

## 2023-09-05 MED ORDER — SODIUM CHLORIDE 0.9% FLUSH
10.0000 mL | Freq: Once | INTRAVENOUS | Status: AC
  Administered 2023-09-05: 10 mL via INTRAVENOUS

## 2023-09-05 MED ORDER — HEPARIN SOD (PORK) LOCK FLUSH 100 UNIT/ML IV SOLN
500.0000 [IU] | Freq: Once | INTRAVENOUS | Status: AC
  Administered 2023-09-05: 500 [IU] via INTRAVENOUS

## 2023-09-05 NOTE — Patient Instructions (Addendum)
[facility] at [facility] Discharge Instructions   You were seen and examined today by Dr. Ellin Saba.  He reviewed the results of your lab work which are normal/stable. Your iron labs from today are pending. We will call you if you need to receive iron infusions.   We will see you back in 6 months. We will repeat lab work and a CT scan prior to your next visit.   Return as scheduled.      Thank you for choosing [facility] at [facility] to provide your oncology and hematology care.  To afford each patient quality time with our provider, please arrive at least 15 minutes before your scheduled appointment time.   If you have a lab appointment with the Cancer Center please come in thru the Main Entrance and check in at the main information desk.  You need to re-schedule your appointment should you arrive 10 or more minutes late.  We strive to give you quality time with our providers, and arriving late affects you and other patients whose appointments are after yours.  Also, if you no show three or more times for appointments you may be dismissed from the clinic at the providers discretion.     Again, thank you for choosing [facility].  Our hope is that these requests will decrease the amount of time that you wait before being seen by our physicians.       _____________________________________________________________  Should you have questions after your visit to [facility], please contact our office at [phone] and follow the prompts.  Our office hours are 8:00 a.m. and 4:30 p.m. Monday - Friday.  Please note that voicemails left after 4:00 p.m. may not be returned until the following business day.  We are closed weekends and major holidays.  You do have access to a nurse 24-7, just call the main number to the clinic [phone] and do not press any options, hold on the line and a nurse will answer the phone.     For prescription refill requests, have your pharmacy contact our office and allow 72 hours.    Due to Covid, you will need to wear a mask upon entering the hospital. If you do not have a mask, a mask will be given to you at the Main Entrance upon arrival. For doctor visits, patients may have 1 support person age 20 or older with them. For treatment visits, patients can not have anyone with them due to social distancing guidelines and our immunocompromised population.

## 2023-09-05 NOTE — Telephone Encounter (Signed)
Per Dr. Ellin Saba, patient called and advised to restart iron tablet Monday, Wednesday, Friday.  Verbalized understanding.

## 2023-09-05 NOTE — Progress Notes (Signed)
Patients port flushed without difficulty.  Good blood return noted with no bruising or swelling noted at site.  Band aid applied.  VSS with discharge and left in satisfactory condition with no s/s of distress noted.   

## 2023-09-30 ENCOUNTER — Ambulatory Visit (HOSPITAL_COMMUNITY)
Admission: RE | Admit: 2023-09-30 | Discharge: 2023-09-30 | Disposition: A | Discharge status: Home / Self Care | Payer: Medicare Other | Source: Ambulatory Visit | Attending: Nurse Practitioner | Admitting: Nurse Practitioner

## 2023-09-30 DIAGNOSIS — N99528 Other complication of other external stoma of urinary tract: Secondary | ICD-10-CM

## 2023-09-30 DIAGNOSIS — Z432 Encounter for attention to ileostomy: Secondary | ICD-10-CM | POA: Diagnosis present

## 2023-09-30 DIAGNOSIS — K435 Parastomal hernia without obstruction or  gangrene: Secondary | ICD-10-CM | POA: Insufficient documentation

## 2023-09-30 DIAGNOSIS — L24B3 Irritant contact dermatitis related to fecal or urinary stoma or fistula: Secondary | ICD-10-CM | POA: Diagnosis not present

## 2023-09-30 NOTE — Discharge Instructions (Signed)
We switched to :  Stoma powder and Skin prep to skin (apply powder and seal with skin prep) Apply barrier ring Used Coloplast 1 piece convex urostomy pouch (cut to 25 mm)  Applied barrier strips (coloplast)  Applied belt

## 2023-09-30 NOTE — Progress Notes (Signed)
[facility]   Reason for visit:  RLQ ileal conduit HPI:  Bladder cancer with ileal conduit Past Medical History:  Diagnosis Date   Atrial fibrillation, rapid (HCC)    With high ventricular rates. 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 (LIMA-LAD; VG-OM1, OM2, VG-PD), EF 55-60% at cath   Degenerative disc disease, lumbar    Epigastric pain    Myoview 11/09/12 Showed evidence of reversible ischemia in both the inferior wall & the anteroapical distribution.    Erectile dysfunction    H/O ED while taking a beta-blocker   GERD (gastroesophageal reflux disease)    Gout    Hiatal hernia    Hyperlipidemia    Hypertension    Systemic HTN. Pt has H/O of ED when taking a beta-blocker. ECHO 11/26/08 Showed no evidence of any significant valvular disease, Estimated EF = 50-60%.   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 Amia intol. Was on Sotalol, now on Lopressor   Polio    Age 77. 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 Cause of death   Allergies  Allergen Reactions   Amiodarone     Hallucinations     Oxycodone Other (See Comments)    Bad dreams and hallucinations   Current Outpatient Medications  Medication Sig Dispense Refill Last Dose   allopurinol (ZYLOPRIM) 300 MG tablet Take 300 mg by mouth daily.      diclofenac Sodium (VOLTAREN) 1 % GEL Apply 1 Application topically 4 (four) times daily as needed (pain).      ferrous sulfate 325 (65 FE) MG tablet Take 325 mg by mouth 3 (three) times a week.      fluticasone (FLONASE) 50 MCG/ACT nasal spray 2 spray topically prior to emptying urostomy bag      ibuprofen (ADVIL) 200 MG tablet Take 600 mg by mouth daily as needed for moderate pain.      Menthol, Topical Analgesic, (BIOFREEZE EX) Apply 1 Application topically daily as needed (pain).      Menthol-Methyl Salicylate (SALONPAS PAIN RELIEF PATCH EX) Apply 1 patch topically daily as needed (pain).      methocarbamol (ROBAXIN) 500 MG tablet Take 1 tablet (500 mg total) by mouth every 6 (six) hours as needed for muscle spasms. 30 tablet 3    metoprolol tartrate (LOPRESSOR) 50 MG tablet Take 1  tablet (50 mg total) by mouth 2 (two) times daily. 180 tablet 3    simvastatin (ZOCOR) 40 MG tablet TAKE 1 TABLET BY MOUTH EVERYDAY AT BEDTIME 90 tablet 3    No current facility-administered medications for this encounter.   ROS  Review of Systems  Gastrointestinal:        RLQ ileal conduit Parastomal hernia  Genitourinary:        Urostomy (ileal conduit)   Psychiatric/Behavioral: Negative.    All other systems reviewed and are negative.  Vital signs:  BP 125/68 (BP Location: Right Arm)   Pulse 69   Temp 98.3 F (36.8 C) (Oral)   Resp 16   SpO2 99%  Exam:  Physical Exam Vitals reviewed.  Constitutional:      Appearance: Normal appearance.  Abdominal:     Palpations: Abdomen is soft.     Hernia: A hernia is present.  Genitourinary:    Comments: Urostomy  uses bedside drainage at night.  Skin:    General: Skin is warm and dry.     Findings: Erythema present.  Neurological:      Mental Status: He is alert and oriented to person, place, and time.  Psychiatric:        Mood and Affect: Mood normal.        Behavior: Behavior normal.     Stoma type/location:  RLQ urostomy Stomal assessment/size:  1" slightly budded Peristomal assessment:  Erythema and irritation peristomal area where adhesive is in contact with skin.   Treatment options for stomal/peristomal skin: implement topical nasal steroid spray to irritated skin. GOing to switch to a tapeless pouching system (trying coloplast)  Output: liquid yellow stool Ostomy pouching: 1pc convex urostomy  provided samples and an adaptor for bedside drainage.   Education provided:  performed pouch change with coloplast pouch.  Barrier ring and stoma powder to weeping skin.   Added barrier strips to perimeter of baseplate and added ostomy belt. Nasacort steroid spray to peristomal skin redness where adhesive was    Impression/dx  Urostomy  Contact dermatitis Medical adhesive related skin injury Discussion  Changed pouch style today Plan  See back 2 weeks.     Visit time: 45 minutes.   Maple Hudson FNP-BC

## 2023-10-04 DIAGNOSIS — N99528 Other complication of other external stoma of urinary tract: Secondary | ICD-10-CM | POA: Insufficient documentation

## 2023-10-04 DIAGNOSIS — L24B3 Irritant contact dermatitis related to fecal or urinary stoma or fistula: Secondary | ICD-10-CM | POA: Insufficient documentation

## 2023-10-24 ENCOUNTER — Ambulatory Visit (HOSPITAL_COMMUNITY)
Admission: RE | Admit: 2023-10-24 | Discharge: 2023-10-24 | Disposition: A | Discharge status: Home / Self Care | Payer: Medicare Other | Source: Ambulatory Visit | Attending: Plastic Surgery | Admitting: Plastic Surgery

## 2023-10-24 ENCOUNTER — Telehealth: Payer: Self-pay | Admitting: *Deleted

## 2023-10-24 ENCOUNTER — Other Ambulatory Visit (HOSPITAL_COMMUNITY): Payer: Self-pay | Admitting: Nurse Practitioner

## 2023-10-24 ENCOUNTER — Ambulatory Visit: Payer: Self-pay | Admitting: Surgery

## 2023-10-24 DIAGNOSIS — N99528 Other complication of other external stoma of urinary tract: Secondary | ICD-10-CM

## 2023-10-24 DIAGNOSIS — Z8551 Personal history of malignant neoplasm of bladder: Secondary | ICD-10-CM | POA: Insufficient documentation

## 2023-10-24 DIAGNOSIS — Z432 Encounter for attention to ileostomy: Secondary | ICD-10-CM | POA: Diagnosis present

## 2023-10-24 DIAGNOSIS — C679 Malignant neoplasm of bladder, unspecified: Secondary | ICD-10-CM | POA: Insufficient documentation

## 2023-10-24 DIAGNOSIS — L24B3 Irritant contact dermatitis related to fecal or urinary stoma or fistula: Secondary | ICD-10-CM | POA: Diagnosis not present

## 2023-10-24 DIAGNOSIS — K4090 Unilateral inguinal hernia, without obstruction or gangrene, not specified as recurrent: Secondary | ICD-10-CM | POA: Insufficient documentation

## 2023-10-24 DIAGNOSIS — K435 Parastomal hernia without obstruction or  gangrene: Secondary | ICD-10-CM | POA: Insufficient documentation

## 2023-10-24 NOTE — Telephone Encounter (Signed)
S/w the pt today and he has been scheduled for tele pre op appt 11/02/23 for procedure. Pt said surgeon will not schedule his surgery until he has been cleared.   Med rec and consent are done.

## 2023-10-24 NOTE — Telephone Encounter (Signed)
Name: Hunter [name]  DOB: 1946-11-01  MRN: [id number]  Primary Cardiologist: Thurmon Fair, MD   Preoperative team, please contact this patient and set up a phone call appointment for further preoperative risk assessment. Please obtain consent and complete medication review. Thank you for your help.  I confirm that guidance regarding antiplatelet and oral anticoagulation therapy has been completed and, if necessary, noted below.  None  I also confirmed the patient resides in the state of [place]. As per [facility] Medical Board telemedicine laws, the patient must reside in the state in which the provider is licensed.   Napoleon Form, Leodis Rains, NP 10/24/2023, 3:50 PM [facility]

## 2023-10-24 NOTE — Telephone Encounter (Signed)
S/w the pt today and he has been scheduled for tele pre op appt 11/02/23 for procedure. Pt said surgeon will not schedule his surgery until he has been cleared.   Med rec and consent are done.      Patient Consent for Virtual Visit        Hunter [name] has provided verbal consent on 10/24/2023 for a virtual visit (video or telephone).   CONSENT FOR VIRTUAL VISIT FOR:  Hunter [name]  By participating in this virtual visit I agree to the following:  I hereby voluntarily request, consent and authorize [facility] and its employed or contracted physicians, physician assistants, nurse practitioners or other licensed health care professionals (the Practitioner), to provide me with telemedicine health care services (the "Services") as deemed necessary by the treating Practitioner. I acknowledge and consent to receive the Services by the Practitioner via telemedicine. I understand that the telemedicine visit will involve communicating with the Practitioner through live audiovisual communication technology and the disclosure of certain medical information by electronic transmission. I acknowledge that I have been given the opportunity to request an in-person assessment or other available alternative prior to the telemedicine visit and am voluntarily participating in the telemedicine visit.  I understand that I have the right to withhold or withdraw my consent to the use of telemedicine in the course of my care at any time, without affecting my right to future care or treatment, and that the Practitioner or I may terminate the telemedicine visit at any time. I understand that I have the right to inspect all information obtained and/or recorded in the course of the telemedicine visit and may receive copies of available information for a reasonable fee.  I understand that some of the potential risks of receiving the Services via telemedicine include:  Delay or interruption in medical evaluation  due to technological equipment failure or disruption; Information transmitted may not be sufficient (e.g. poor resolution of images) to allow for appropriate medical decision making by the Practitioner; and/or  In rare instances, security protocols could fail, causing a breach of personal health information.  Furthermore, I acknowledge that it is my responsibility to provide information about my medical history, conditions and care that is complete and accurate to the best of my ability. I acknowledge that Practitioner's advice, recommendations, and/or decision may be based on factors not within their control, such as incomplete or inaccurate data provided by me or distortions of diagnostic images or specimens that may result from electronic transmissions. I understand that the practice of medicine is not an exact science and that Practitioner makes no warranties or guarantees regarding treatment outcomes. I acknowledge that a copy of this consent can be made available to me via my patient portal [facility] MyChart), or I can request a printed copy by calling the office of [facility].    I understand that my insurance will be billed for this visit.   I have read or had this consent read to me. I understand the contents of this consent, which adequately explains the benefits and risks of the Services being provided via telemedicine.  I have been provided ample opportunity to ask questions regarding this consent and the Services and have had my questions answered to my satisfaction. I give my informed consent for the services to be provided through the use of telemedicine in my medical care

## 2023-10-24 NOTE — Discharge Instructions (Addendum)
Add 3XL ostomy belt Y shaped barrier strips and curved NIg[place] drainage bag

## 2023-10-24 NOTE — Progress Notes (Signed)
Hunter [name]   Reason for visit:  RLQ ileal conduit  HPI:  Bladder cancer Past Medical History:  Diagnosis Date   Atrial fibrillation, rapid (HCC)    With high ventricular rates. 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 (LIMA-LAD; VG-OM1, OM2, VG-PD), EF 55-60% at cath   Degenerative disc disease, lumbar    Epigastric pain    Myoview 11/09/12 Showed evidence of reversible ischemia in both the inferior wall & the anteroapical distribution.    Erectile dysfunction    H/O ED while taking a beta-blocker   GERD (gastroesophageal reflux disease)    Gout    Hiatal hernia    Hyperlipidemia    Hypertension    Systemic HTN. Pt has H/O of ED when taking a beta-blocker. ECHO 11/26/08 Showed no evidence of any significant valvular disease, Estimated EF = 50-60%.   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 Amia intol. Was on Sotalol, now on Lopressor   Polio    Age 18. 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 Cause of death   Allergies  Allergen Reactions   Amiodarone     Hallucinations    Oxycodone Other  (See Comments)    Bad dreams and hallucinations   Current Outpatient Medications  Medication Sig Dispense Refill Last Dose   allopurinol (ZYLOPRIM) 300 MG tablet Take 300 mg by mouth daily.      diclofenac Sodium (VOLTAREN) 1 % GEL Apply 1 Application topically 4 (four) times daily as needed (pain).      ferrous sulfate 325 (65 FE) MG tablet Take 325 mg by mouth 3 (three) times a week.      fluticasone (FLONASE) 50 MCG/ACT nasal spray 2 spray topically prior to emptying urostomy bag      ibuprofen (ADVIL) 200 MG tablet Take 600 mg by mouth daily as needed for moderate pain.      Menthol, Topical Analgesic, (BIOFREEZE EX) Apply 1 Application topically daily as needed (pain).      Menthol-Methyl Salicylate (SALONPAS PAIN RELIEF PATCH EX) Apply 1 patch topically daily as needed (pain).      methocarbamol (ROBAXIN) 500 MG tablet Take 1 tablet (500 mg total) by mouth every 6 (six) hours as needed for muscle spasms. 30 tablet 3    metoprolol tartrate (LOPRESSOR) 50 MG tablet Take 1 tablet (50  mg total) by mouth 2 (two) times daily. 180 tablet 3    simvastatin (ZOCOR) 40 MG tablet TAKE 1 TABLET BY MOUTH EVERYDAY AT BEDTIME 90 tablet 3    No current facility-administered medications for this encounter.   ROS  Review of Systems  Gastrointestinal:        RLQ ileal conduit with parastomal hernia Abdominal hernia to LMQ as well   Genitourinary:        Ileal conduit in RLQ   Skin:  Positive for color change.       Contact dermatitis improving  Neurological: Negative.   Psychiatric/Behavioral: Negative.    All other systems reviewed and are negative.  Vital signs:  BP 123/74   Pulse 68   Temp 98.2 F (36.8 C) (Oral)   Resp 18   SpO2 100%  Exam:  Physical Exam Constitutional:      Appearance: Normal appearance.  Cardiovascular:     Rate and Rhythm: Normal rate and regular rhythm.     Pulses: Normal pulses.     Heart sounds: Normal heart sounds.  Abdominal:     Palpations: Abdomen is  soft.     Hernia: A hernia is present.  Skin:    General: Skin is warm and dry.     Findings: Erythema present.  Neurological:     Mental Status: He is alert and oriented to person, place, and time.  Psychiatric:        Mood and Affect: Mood normal.        Behavior: Behavior normal.     Stoma type/location:  RLQ ileal conduit Stomal assessment/size:  1" pink and moist  Peristomal assessment:  Parastomal hernia  and LMQ hernia  both with pronounced bulging  Treatment options for stomal/peristomal skin: barrier ring around stoma, 1 piece convex coloplast (tapeless) pouch.  Was using stoma powder and skin prep to irritated peristomal skin.  This has resolved.   Wearing hernia support belt to support weight of hernia.  Patient states this has greatly improved his wear time.  Output: clear yellow urine Ostomy pouching: 1pc convex with barrier ring and ostomy support belt Education provided:  Will update orders with Home Care deliverables.  Include barrier strips.  Y shaped and curved shapes.  Upcoming appointment with surgeon regarding LMQ hernia repair.  Discussed limiting lifting, pushing and pulling.     Impression/dx  Ileal conduit Irritant contact dermatitis, resolving Discussion  Hernia Improvement in peristomal skin Improvement in wear time with hernia support belt.   Plan  PRovided nighttime drainage bag and barrier strips that were needed.  WIll update orders     Visit time: 55 minutes.   Maple Hudson FNP-BC

## 2023-10-24 NOTE — Telephone Encounter (Signed)
Pre-operative Risk Assessment    Patient Name: Hunter [name]  DOB: [birth year] MRN: [id number]  DATE OF LAST VISIT: 05/13/23 Hunter Person, NP DATE OF NEXT VISIT: NONE    Request for Surgical Clearance    Procedure:   HERNIA SURGERY PER CLEARANCE FORM  Date of Surgery:  Clearance TBD                                 Surgeon:  DR. Karie [name] Surgeon's Group or Practice Name:  [facility] Phone number:  [phone] Fax number:  [phone] ATTN: Hunter [name], CMA   Type of Clearance Requested:   - Medical ; NO MEDICATIONS INDICATED TO BE HELD   Type of Anesthesia:  General    Additional requests/questions:    Hunter [name]   10/24/2023, 3:44 PM

## 2023-11-02 ENCOUNTER — Ambulatory Visit: Payer: Medicare Other | Attending: Cardiology

## 2023-11-02 DIAGNOSIS — Z01810 Encounter for preprocedural cardiovascular examination: Secondary | ICD-10-CM | POA: Diagnosis not present

## 2023-11-02 NOTE — Progress Notes (Signed)
Virtual Visit via Telephone Note   Because of Hunter [name]'s co-morbid illnesses, he is at least at moderate risk for complications without adequate follow up.  This format is felt to be most appropriate for this patient at this time.  The patient did not have access to video technology/had technical difficulties with video requiring transitioning to audio format only (telephone).  All issues noted in this document were discussed and addressed.  No physical exam could be performed with this format.  Please refer to the patient's chart for his consent to telehealth for [facility].  Evaluation Performed:  Preoperative cardiovascular risk assessment _____________   Date:  11/02/2023   Patient ID:  Hunter [name], DOB 1946-03-23, MRN [id number] Patient Location:  Home Provider location:   Office  Primary Care Provider:  Garald Braver Primary Cardiologist:  Thurmon Fair, MD  Chief Complaint / Patient Profile   77 y.o. y/o male with a h/o CAD s/p CABG x 4 (LIMA-LAD; SVG-OM1, OM2, SVG-PD) in 2013, paroxysmal atrial fibrillation, hypertension, hyperlipidemia, CKD stage IIIa, prediabetes, and bladder cancer  who is pending hernia surgery and presents today for telephonic preoperative cardiovascular risk assessment.  History of Present Illness    Hunter [name] is a 77 y.o. male who presents via audio/video conferencing for a telehealth visit today.  Pt was last seen in cardiology clinic on 05/13/2023 by [name], NP.  At that time Hunter [name] was doing well from cardiac standpoint.  Recently completed anterior C-spine surgery in April.  The patient is now pending procedure as outlined above. Since his last visit, he has been doing well with no new cardiac complaints since his previous follow-up.  He is staying active and recently spent a week at the beach with lots of walking and dancing nightly.  He denies chest pain, shortness of breath, lower extremity edema,  fatigue, palpitations, melena, hematuria, hemoptysis, diaphoresis, weakness, presyncope, syncope, orthopnea, and PND.    Past Medical History    Past Medical History:  Diagnosis Date   Atrial fibrillation, rapid (HCC)    With high ventricular rates. 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 (LIMA-LAD; VG-OM1, OM2, VG-PD), EF 55-60% at cath   Degenerative disc disease, lumbar    Epigastric pain    Myoview 11/09/12 Showed evidence of reversible ischemia in both the inferior wall & the anteroapical distribution.    Erectile dysfunction    H/O ED while taking a beta-blocker   GERD (gastroesophageal reflux disease)    Gout    Hiatal hernia    Hyperlipidemia    Hypertension    Systemic HTN. Pt has H/O of ED when taking a beta-blocker. ECHO 11/26/08 Showed no evidence of any significant valvular disease, Estimated EF = 50-60%.   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 Amia intol. Was on Sotalol, now on Lopressor   Polio    Age 53. 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; reports dx at 9mos  S/P CABG x 4, 12/12/12, LIMA-LAD;VG-OM1,OM2;VG-PD 12/15/2012   Seasonal allergies    Skin cancer (melanoma) (HCC) 08/04/2015   left side of face    Skin lesion of face    hx of   Past Surgical History:  Procedure Laterality Date   ANTERIOR CERVICAL DECOMPRESSION/DISCECTOMY FUSION 4 LEVELS N/A 04/11/2023   Procedure: Cervical three-four Cervical four-five Cervical five-six Cervical six-seven Anterior Cervical Decompression Fusion;  Surgeon: Hunter Abu, MD;   Location: [facility] OR;  Service: Neurosurgery;  Laterality: N/A;   COLONOSCOPY     CORONARY ARTERY BYPASS GRAFT  12/12/2012   CABG x 4 using left internal mammary artery and right endovein harvest. (LIMA-LAD; VG-OM1, OM2, VG-PD)   CYSTOSCOPY WITH INJECTION N/A 02/04/2021   Procedure: CYSTOSCOPY WITH INJECTION OF INDOCYANINE GREEN DYE;  Surgeon: Sebastian Ache, MD;  Location: [facility] ORS;  Service: Urology;  Laterality: N/A;   EYE SURGERY  1996   had lasik surgery bilaterally   IR IMAGING GUIDED PORT INSERTION  09/10/2020   LEFT HEART CATHETERIZATION WITH CORONARY ANGIOGRAM N/A 12/08/2012   Procedure: LEFT HEART CATHETERIZATION WITH CORONARY ANGIOGRAM;  Surgeon: Thurmon Fair, MD;  Location: [facility] CATH LAB;  Service: Cardiovascular;  Laterality: N/A;   LYMPHADENECTOMY Bilateral 02/04/2021   Procedure: LYMPHADENECTOMY;  Surgeon: Sebastian Ache, MD;  Location: [facility];  Service: Urology;  Laterality: Bilateral;   ROBOT ASSISTED LAPAROSCOPIC COMPLETE CYSTECT ILEAL CONDUIT N/A 02/04/2021   Procedure: XI ROBOTIC ASSISTED LAPAROSCOPIC COMPLETE CYSTOPROSTATECTOMY ILEAL CONDUIT;  Surgeon: Sebastian Ache, MD;  Location: [facility];  Service: Urology;  Laterality: N/A;  6 HRS   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   SKIN CANCER EXCISION  2016   left side of face    TRANSURETHRAL RESECTION OF BLADDER TUMOR N/A 11/16/2019   Procedure: TRANSURETHRAL RESECTION OF BLADDER TUMOR (TURBT) WITH INSTILLATION OF POST OPERATIVE CHEMOTHERAPY;  Surgeon: Ihor Gully, MD;  Location: [facility];  Service: Urology;  Laterality: N/A;   TRANSURETHRAL RESECTION OF BLADDER TUMOR WITH MITOMYCIN-C N/A 08/12/2020   Procedure: TRANSURETHRAL RESECTION OF BLADDER TUMOR WITH GEMCITABINE;  Surgeon: Noel Christmas, MD;  Location: [facility];  Service: Urology;  Laterality: N/A;  1 HR   UPPER GI ENDOSCOPY      Allergies  Allergies  Allergen Reactions   Amiodarone     Hallucinations    Oxycodone Other (See Comments)    Bad dreams and  hallucinations    Home Medications    Prior to Admission medications   Medication Sig Start Date End Date Taking? Authorizing Provider  allopurinol (ZYLOPRIM) 300 MG tablet Take 300 mg by mouth daily.    [provider]  diclofenac Sodium (VOLTAREN) 1 % GEL Apply 1 Application topically 4 (four) times daily as needed (pain).    [provider]  ferrous sulfate 325 (65 FE) MG tablet Take 325 mg by mouth 3 (three) times a week.    [provider]  fluticasone (FLONASE) 50 MCG/ACT nasal spray 2 spray topically prior to emptying urostomy bag    [provider]  ibuprofen (ADVIL) 200 MG tablet Take 600 mg by mouth daily as needed for moderate pain.    [provider]  Menthol, Topical Analgesic, (BIOFREEZE EX) Apply 1 Application topically daily as needed (pain).    [provider]  Menthol-Methyl Salicylate (SALONPAS PAIN RELIEF PATCH EX) Apply 1 patch topically daily as needed (pain).    [provider]  methocarbamol (ROBAXIN) 500 MG tablet Take 1 tablet (500 mg  total) by mouth every 6 (six) hours as needed for muscle spasms. 04/12/23   Hunter Abu, MD  metoprolol tartrate (LOPRESSOR) 50 MG tablet Take 1 tablet (50 mg total) by mouth 2 (two) times daily. 05/13/23   Joylene Grapes, NP  simvastatin (ZOCOR) 40 MG tablet TAKE 1 TABLET BY MOUTH EVERYDAY AT BEDTIME 02/14/23   Croitoru, Rachelle Hora, MD    Physical Exam    Vital Signs:  Linzy Darling does not have vital signs available for review today.  Given telephonic nature of communication, physical exam is limited. AAOx3. NAD. Normal affect.  Speech and respirations are unlabored.  Accessory Clinical Findings    None  Assessment & Plan    1.  Preoperative Cardiovascular Risk Assessment: -Patient's RCRI score is 6.6%  The patient affirms he has been doing well without any new cardiac symptoms. They are able to achieve 6 METS without cardiac limitations. Therefore, based on  ACC/AHA guidelines, the patient would be at acceptable risk for the planned procedure without further cardiovascular testing. The patient was advised that if he develops new symptoms prior to surgery to contact our office to arrange for a follow-up visit, and he verbalized understanding.   The patient was advised that if he develops new symptoms prior to surgery to contact our office to arrange for a follow-up visit, and he verbalized understanding.  No medications to hold  A copy of this note will be routed to requesting surgeon.  Time:   Today, I have spent 7 minutes with the patient with telehealth technology discussing medical history, symptoms, and management plan.     Napoleon Form, Leodis Rains, NP  11/02/2023, 7:48 AM

## 2023-12-05 ENCOUNTER — Inpatient Hospital Stay: Payer: Medicare Other

## 2023-12-05 ENCOUNTER — Inpatient Hospital Stay: Payer: Medicare Other | Attending: Oncology

## 2023-12-05 DIAGNOSIS — Z452 Encounter for adjustment and management of vascular access device: Secondary | ICD-10-CM | POA: Insufficient documentation

## 2023-12-05 DIAGNOSIS — C679 Malignant neoplasm of bladder, unspecified: Secondary | ICD-10-CM | POA: Insufficient documentation

## 2023-12-05 MED ORDER — SODIUM CHLORIDE 0.9% FLUSH
10.0000 mL | INTRAVENOUS | Status: AC
  Administered 2023-12-05: 10 mL

## 2023-12-05 MED ORDER — HEPARIN SOD (PORK) LOCK FLUSH 100 UNIT/ML IV SOLN
500.0000 [IU] | Freq: Once | INTRAVENOUS | Status: AC
  Administered 2023-12-05: 500 [IU] via INTRAVENOUS

## 2023-12-05 NOTE — Progress Notes (Signed)
Mont Dutton presented for Portacath access and flush.  Portacath located right chest wall accessed with  H 20 needle.  Good blood return present. Portacath flushed with 20ml NS and 500U/26ml Heparin and needle removed intact.  Discharged from clinic ambulatory in stable condition. Alert and oriented x 3. F/U with [facility] as scheduled.

## 2023-12-12 NOTE — Patient Instructions (Signed)
DUE TO COVID-19 ONLY TWO VISITORS  (aged 77 and older)  ARE ALLOWED TO COME WITH YOU AND STAY IN THE WAITING ROOM ONLY DURING PRE OP AND PROCEDURE.   **NO VISITORS ARE ALLOWED IN THE SHORT STAY AREA OR RECOVERY ROOM!!**  IF YOU WILL BE ADMITTED INTO THE HOSPITAL YOU ARE ALLOWED ONLY FOUR SUPPORT PEOPLE DURING VISITATION HOURS ONLY (7 AM -8PM)   The support person(s) must pass our screening, gel in and out, and wear a mask at all times, including in the patient's room. Patients must also wear a mask when staff or their support person are in the room. Visitors GUEST BADGE MUST BE WORN VISIBLY  One adult visitor may remain with you overnight and MUST be in the room by 8 P.M.     Your procedure is scheduled on: 12/28/22   Report to [facility] Main Entrance    Report to admitting at : 5:15 AM   Call this number if you have problems the morning of surgery [phone]   Do not eat food :After Midnight.   After Midnight you may have the following liquids until : 4:30 AM DAY OF SURGERY  Water Black Coffee (sugar ok, NO MILK/CREAM OR CREAMERS)  Tea (sugar ok, NO MILK/CREAM OR CREAMERS) regular and decaf                             Plain Jell-O (NO RED)                                           Fruit ices (not with fruit pulp, NO RED)                                     Popsicles (NO RED)                                                                  Juice: apple, WHITE grape, WHITE cranberry Sports drinks like Gatorade (NO RED)              Drink 2 Ensure/G2 drinks AT 10:00 PM the night before surgery.     The day of surgery:  Drink ONE (1) Pre-Surgery Clear Ensure at : 4:30 AM the morning of surgery. Drink in one sitting. Do not sip.  This drink was given to you during your hospital  pre-op appointment visit. Nothing else to drink after completing the  Pre-Surgery Clear Ensure or G2.          If you have questions, please contact your surgeon's office.  FOLLOW BOWEL PREP  AND ANY ADDITIONAL PRE OP INSTRUCTIONS YOU RECEIVED FROM YOUR SURGEON'S OFFICE!!!    Oral Hygiene is also important to reduce your risk of infection.                                    Remember - BRUSH YOUR TEETH THE MORNING OF SURGERY WITH YOUR REGULAR TOOTHPASTE  DENTURES  WILL BE REMOVED PRIOR TO SURGERY PLEASE DO NOT APPLY "Poly grip" OR ADHESIVES!!!   Do NOT smoke after Midnight   Take these medicines the morning of surgery with A SIP OF WATER: metoprolol,allopurinol.Use Flonase as usual.Tylenol as needed.                              You may not have any metal on your body including hair pins, jewelry, and body piercing             Do not wear lotions, powders, perfumes/cologne, or deodorant              Men may shave face and neck.   Do not bring valuables to the hospital. [facility] IS NOT             RESPONSIBLE   FOR VALUABLES.   Contacts, glasses, or bridgework may not be worn into surgery.   Bring small overnight bag day of surgery.   DO NOT BRING YOUR HOME MEDICATIONS TO THE HOSPITAL. PHARMACY WILL DISPENSE MEDICATIONS LISTED ON YOUR MEDICATION LIST TO YOU DURING YOUR ADMISSION IN THE HOSPITAL!    Patients discharged on the day of surgery will not be allowed to drive home.  Someone NEEDS to stay with you for the first 24 hours after anesthesia.   Special Instructions: Bring a copy of your healthcare power of attorney and living will documents         the day of surgery if you haven't scanned them before.              Please read over the following fact sheets you were given: IF YOU HAVE QUESTIONS ABOUT YOUR PRE-OP INSTRUCTIONS PLEASE CALL [phone]    [facility] - Preparing for Surgery Before surgery, you can play an important role.  Because skin is not sterile, your skin needs to be as free of germs as possible.  You can reduce the number of germs on your skin by washing with CHG (chlorahexidine gluconate) soap before surgery.  CHG is an antiseptic cleaner which  kills germs and bonds with the skin to continue killing germs even after washing. Please DO NOT use if you have an allergy to CHG or antibacterial soaps.  If your skin becomes reddened/irritated stop using the CHG and inform your nurse when you arrive at Short Stay. Do not shave (including legs and underarms) for at least 48 hours prior to the first CHG shower.  You may shave your face/neck. Please follow these instructions carefully:  1.  Shower with CHG Soap the night before surgery and the  morning of Surgery.  2.  If you choose to wash your hair, wash your hair first as usual with your  normal  shampoo.  3.  After you shampoo, rinse your hair and body thoroughly to remove the  shampoo.                           4.  Use CHG as you would any other liquid soap.  You can apply chg directly  to the skin and wash                       Gently with a scrungie or clean washcloth.  5.  Apply the CHG Soap to your body ONLY FROM THE NECK DOWN.   Do not use on face/ open  Wound or open sores. Avoid contact with eyes, ears mouth and genitals (private parts).                       Wash face,  Genitals (private parts) with your normal soap.             6.  Wash thoroughly, paying special attention to the area where your surgery  will be performed.  7.  Thoroughly rinse your body with warm water from the neck down.  8.  DO NOT shower/wash with your normal soap after using and rinsing off  the CHG Soap.                9.  Pat yourself dry with a clean towel.            10.  Wear clean pajamas.            11.  Place clean sheets on your bed the night of your first shower and do not  sleep with pets. Day of Surgery : Do not apply any lotions/deodorants the morning of surgery.  Please wear clean clothes to the hospital/surgery center.  FAILURE TO FOLLOW THESE INSTRUCTIONS MAY RESULT IN THE CANCELLATION OF YOUR SURGERY PATIENT SIGNATURE_________________________________  NURSE  SIGNATURE__________________________________  ________________________________________________________________________

## 2023-12-14 ENCOUNTER — Encounter (HOSPITAL_COMMUNITY)
Admission: RE | Admit: 2023-12-14 | Discharge: 2023-12-14 | Disposition: A | Discharge status: Home / Self Care | Payer: Medicare Other | Source: Ambulatory Visit | Attending: Surgery | Admitting: Surgery

## 2023-12-14 ENCOUNTER — Other Ambulatory Visit: Payer: Self-pay | Admitting: Surgery

## 2023-12-14 ENCOUNTER — Encounter (HOSPITAL_COMMUNITY): Payer: Self-pay

## 2023-12-14 ENCOUNTER — Ambulatory Visit: Payer: Self-pay | Admitting: Surgery

## 2023-12-14 ENCOUNTER — Other Ambulatory Visit: Payer: Self-pay

## 2023-12-14 VITALS — BP 119/67 | HR 56 | Temp 97.7°F | Ht 69.0 in | Wt 181.0 lb

## 2023-12-14 DIAGNOSIS — D649 Anemia, unspecified: Secondary | ICD-10-CM | POA: Insufficient documentation

## 2023-12-14 DIAGNOSIS — K449 Diaphragmatic hernia without obstruction or gangrene: Secondary | ICD-10-CM | POA: Diagnosis not present

## 2023-12-14 DIAGNOSIS — Z01812 Encounter for preprocedural laboratory examination: Secondary | ICD-10-CM | POA: Diagnosis present

## 2023-12-14 DIAGNOSIS — Z951 Presence of aortocoronary bypass graft: Secondary | ICD-10-CM | POA: Insufficient documentation

## 2023-12-14 DIAGNOSIS — I480 Paroxysmal atrial fibrillation: Secondary | ICD-10-CM | POA: Insufficient documentation

## 2023-12-14 DIAGNOSIS — I10 Essential (primary) hypertension: Secondary | ICD-10-CM | POA: Insufficient documentation

## 2023-12-14 DIAGNOSIS — I472 Ventricular tachycardia, unspecified: Secondary | ICD-10-CM | POA: Insufficient documentation

## 2023-12-14 DIAGNOSIS — I471 Supraventricular tachycardia, unspecified: Secondary | ICD-10-CM | POA: Diagnosis not present

## 2023-12-14 DIAGNOSIS — I2510 Atherosclerotic heart disease of native coronary artery without angina pectoris: Secondary | ICD-10-CM | POA: Diagnosis not present

## 2023-12-14 HISTORY — DX: Personal history of urinary calculi: Z87.442

## 2023-12-14 HISTORY — DX: Cardiac arrhythmia, unspecified: I49.9

## 2023-12-14 LAB — CBC
HCT: 38.2 % — ABNORMAL LOW (ref 39.0–52.0)
Hemoglobin: 12.7 g/dL — ABNORMAL LOW (ref 13.0–17.0)
MCH: 32.3 pg (ref 26.0–34.0)
MCHC: 33.2 g/dL (ref 30.0–36.0)
MCV: 97.2 fL (ref 80.0–100.0)
Platelets: 151 10*3/uL (ref 150–400)
RBC: 3.93 MIL/uL — ABNORMAL LOW (ref 4.22–5.81)
RDW: 14.3 % (ref 11.5–15.5)
WBC: 6.9 10*3/uL (ref 4.0–10.5)
nRBC: 0 % (ref 0.0–0.2)

## 2023-12-14 LAB — COMPREHENSIVE METABOLIC PANEL
ALT: 19 U/L (ref 0–44)
AST: 21 U/L (ref 15–41)
Albumin: 3.7 g/dL (ref 3.5–5.0)
Alkaline Phosphatase: 98 U/L (ref 38–126)
Anion gap: 7 (ref 5–15)
BUN: 24 mg/dL — ABNORMAL HIGH (ref 8–23)
CO2: 22 mmol/L (ref 22–32)
Calcium: 8.9 mg/dL (ref 8.9–10.3)
Chloride: 110 mmol/L (ref 98–111)
Creatinine, Ser: 1.68 mg/dL — ABNORMAL HIGH (ref 0.61–1.24)
GFR, Estimated: 42 mL/min — ABNORMAL LOW (ref >60)
Glucose, Bld: 101 mg/dL — ABNORMAL HIGH (ref 70–99)
Potassium: 3.9 mmol/L (ref 3.5–5.1)
Sodium: 139 mmol/L (ref 135–145)
Total Bilirubin: 0.6 mg/dL (ref <1.2)
Total Protein: 6.7 g/dL (ref 6.5–8.1)

## 2023-12-14 NOTE — Progress Notes (Signed)
Lab. Results: Creatinine: 1.68

## 2023-12-14 NOTE — Progress Notes (Signed)
For Anesthesia: PCP - Vanessa And[place]ia, FNP  Cardiologist - Thurmon Fair, MD  Clearance: Rolin Barry: NP: 11/02/23 Bowel Prep reminder: No instructions yet.  Chest x-ray -  EKG - 05/13/23 Stress Test -  ECHO - 11/11/20 Cardiac Cath - 12/08/12 Pacemaker/ICD device last checked: Pacemaker orders received: Device Rep notified:  Spinal Cord Stimulator:N/A  Sleep Study - N/A CPAP -   Fasting Blood Sugar - N/A Checks Blood Sugar _____ times a day Date and result of last Hgb A1c-  Last dose of GLP1 agonist- N/A GLP1 instructions:   Last dose of SGLT-2 inhibitors- N/A SGLT-2 instructions:   Blood Thinner Instructions:N/A Aspirin Instructions: Last Dose:  Activity level: Can go up a flight of stairs and activities of daily living without stopping and without chest pain and/or shortness of breath   Able to exercise without chest pain and/or shortness of breath  Anesthesia review: Hx: CAD,Afib,HTN,NSVT,CABG x 4.  Patient denies shortness of breath, fever, cough and chest pain at PAT appointment   Patient verbalized understanding of instructions that were given to them at the PAT appointment. Patient was also instructed that they will need to review over the PAT instructions again at home before surgery.

## 2023-12-15 ENCOUNTER — Encounter (HOSPITAL_COMMUNITY): Payer: Self-pay

## 2023-12-15 NOTE — Progress Notes (Signed)
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 Michaell Cowing. 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  Patient follows with Cardiology for hx of CAD s/p CABG, and PAF. Last seen on 05/13/23. Noted to be doing well from cardiac standpoint. He is not anticoagulated due to PAF being diagnosed as transient episode post-op. Patch monitoring showed NSVT and SVT but no episodes of A.fib. Cleared for surgery on 11/02/23:   "Preoperative Cardiovascular Risk Assessment: -Patient's RCRI score is 6.6%   The patient affirms he has been doing well without any new cardiac symptoms. They are able to achieve 6 METS without cardiac limitations. Therefore, based on ACC/AHA guidelines, the patient would be at acceptable risk for the planned procedure without further cardiovascular testing. The patient was advised that if he develops new symptoms prior to surgery to contact our office to arrange for a follow-up visit, and he verbalized understanding."  Pt follows with Oncology for hx of bladder and prostate cancer. Last seen on 09/05/23. He is s/p chemo and definitive surgery. Undergoing surveillance scans. Has a port a cath.   VS: BP 119/67   Pulse (!) 56   Temp 36.5 C (Oral)   Ht 5\' 9"  (1.753 m)   Wt 82.1 kg   SpO2 99%   BMI 26.73 kg/m   PROVIDERS: Vanessa , FNP Primary Cardiologist:  Thurmon Fair, MD  LABS: Labs reviewed: Acceptable for surgery. Anemia and CKD at baseline (all labs ordered are listed, but only abnormal results are displayed)  Labs Reviewed  COMPREHENSIVE METABOLIC PANEL - Abnormal; Notable for the following  components:      Result Value   Glucose, Bld 101 (*)    BUN 24 (*)    Creatinine, Ser 1.68 (*)    GFR, Estimated 42 (*)    All other components within normal limits  CBC - Abnormal; Notable for the following components:   RBC 3.93 (*)    Hemoglobin 12.7 (*)    HCT 38.2 (*)    All other components within normal limits     IMAGES:   EKG:   CV:  Echo 10/2020: IMPRESSIONS     1. Left ventricular ejection fraction, by estimation, is 50%. The left  ventricle has low normal function with beat to beat variability. The left  ventricle has no regional wall motion abnormalities. There is mild left  ventricular hypertrophy. Left  ventricular diastolic parameters are indeterminate.   2. Right ventricular systolic function is normal. The right ventricular  size is normal. Tricuspid regurgitation signal is inadequate for assessing  PA pressure.   3. The mitral valve is normal in structure. Trivial mitral valve  regurgitation. No evidence of mitral stenosis.   4. The aortic valve is normal in structure. Aortic valve regurgitation is  not visualized. No aortic stenosis is present.    Zio 11/2020: 2 episodes of NSVT, both lasting 7 beats 5 episodes of SVT, longest lasting 10 beats No atrial fibrillation   7 days of data recorded on Zio monitor. Patient had a min HR of 55 bpm, max HR of 203 bpm, and avg HR of 94 bpm. Predominant underlying rhythm was  Sinus Rhythm. No atrial fibrillation, high degree block, or pauses noted.  2 episodes of NSVT, both lasting 7 beats.  5 episodes of SVT, longest lasting 10 beats.  Isolated atrial and ventricular ectopy was rare (<1%). There were 0 triggered events.    ETT 2016: There was no ST segment deviation noted during stress. No T wave inversion was noted during stress. The patient reported shortness of breath during the stress test. The patient experienced no angina during the stress test. Duke Treadmill Score: low risk   Negative stress test  without evidence of ischemia at given workload.  Past Medical History:  Diagnosis Date   Atrial fibrillation, rapid (HCC)    With high ventricular rates. 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 (LIMA-LAD; VG-OM1, OM2, VG-PD), EF 55-60% at cath   Degenerative disc disease, lumbar    Dysrhythmia    Epigastric pain    Myoview 11/09/12 Showed evidence of reversible ischemia in both the inferior wall & the anteroapical distribution.    Erectile dysfunction    H/O ED while taking a beta-blocker   GERD (gastroesophageal reflux disease)    Gout    Hiatal hernia    History of kidney stones    Hyperlipidemia    Hypertension    Systemic HTN. Pt has H/O of ED when taking a beta-blocker. ECHO 11/26/08 Showed no evidence of any significant valvular disease, Estimated EF = 50-60%.   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 Amia intol. Was on Sotalol, now on Lopressor   Polio    Age 36. 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, LIMA-LAD;VG-OM1,OM2;VG-PD 12/15/2012   Seasonal allergies    Skin cancer (melanoma) (HCC) 08/04/2015   left side of face    Skin lesion of face    hx of    Past Surgical History:  Procedure Laterality Date   ANTERIOR CERVICAL DECOMPRESSION/DISCECTOMY FUSION 4 LEVELS N/A 04/11/2023   Procedure: Cervical three-four Cervical four-five Cervical five-six Cervical six-seven Anterior Cervical Decompression Fusion;  Surgeon: Barnett Abu, MD;  Location: [facility] OR;  Service: Neurosurgery;  Laterality: N/A;   COLONOSCOPY     CORONARY ARTERY BYPASS GRAFT  12/12/2012   CABG  x 4 using left internal mammary artery and right endovein harvest. (LIMA-LAD; VG-OM1, OM2, VG-PD)   CYSTOSCOPY WITH INJECTION N/A 02/04/2021   Procedure: CYSTOSCOPY WITH INJECTION OF INDOCYANINE GREEN DYE;  Surgeon: Sebastian Ache, MD;  Location: [facility] ORS;  Service: Urology;  Laterality: N/A;   EYE SURGERY  1996   had lasik surgery bilaterally   IR IMAGING GUIDED PORT INSERTION  09/10/2020   LEFT HEART CATHETERIZATION WITH CORONARY ANGIOGRAM N/A 12/08/2012   Procedure: LEFT HEART CATHETERIZATION WITH CORONARY ANGIOGRAM;  Surgeon: Thurmon Fair, MD;  Location: [facility] CATH LAB;  Service: Cardiovascular;  Laterality: N/A;   LYMPHADENECTOMY Bilateral 02/04/2021   Procedure: LYMPHADENECTOMY;  Surgeon: Sebastian Ache, MD;  Location: [facility] ORS;  Service: Urology;  Laterality: Bilateral;   ROBOT ASSISTED LAPAROSCOPIC COMPLETE CYSTECT ILEAL CONDUIT N/A 02/04/2021  Procedure: XI ROBOTIC ASSISTED LAPAROSCOPIC COMPLETE CYSTOPROSTATECTOMY ILEAL CONDUIT;  Surgeon: Sebastian Ache, MD;  Location: [facility] ORS;  Service: Urology;  Laterality: N/A;  6 HRS   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   SKIN CANCER EXCISION  2016   left side of face    TRANSURETHRAL RESECTION OF BLADDER TUMOR N/A 11/16/2019   Procedure: TRANSURETHRAL RESECTION OF BLADDER TUMOR (TURBT) WITH INSTILLATION OF POST OPERATIVE CHEMOTHERAPY;  Surgeon: Ihor Gully, MD;  Location: [facility] ORS;  Service: Urology;  Laterality: N/A;   TRANSURETHRAL RESECTION OF BLADDER TUMOR WITH MITOMYCIN-C N/A 08/12/2020   Procedure: TRANSURETHRAL RESECTION OF BLADDER TUMOR WITH GEMCITABINE;  Surgeon: Noel Christmas, MD;  Location: [facility] ORS;  Service: Urology;  Laterality: N/A;  1 HR   UPPER GI ENDOSCOPY      MEDICATIONS:  acetaminophen (TYLENOL) 500 MG tablet   allopurinol (ZYLOPRIM) 300 MG tablet   ferrous sulfate 325 (65 FE) MG tablet   fluticasone (FLONASE) 50 MCG/ACT nasal spray   metoprolol tartrate (LOPRESSOR) 50 MG tablet   simvastatin (ZOCOR) 40 MG  tablet   No current facility-administered medications for this encounter.   Marcille Blanco [facility] Short Stay/Anesthesiology [facility] Phone [phone] 12/15/2023 10:07 AM

## 2023-12-15 NOTE — Anesthesia Preprocedure Evaluation (Signed)
Anesthesia Evaluation    Airway        Dental   Pulmonary           Cardiovascular hypertension,      Neuro/Psych    GI/Hepatic   Endo/Other    Renal/GU      Musculoskeletal   Abdominal   Peds  Hematology   Anesthesia Other Findings   Reproductive/Obstetrics                              Anesthesia Physical Anesthesia Plan  ASA:   Anesthesia Plan:    Post-op Pain Management:    Induction:   PONV Risk Score and Plan:   Airway Management Planned:   Additional Equipment:   Intra-op Plan:   Post-operative Plan:   Informed Consent:   Plan Discussed with:   Anesthesia Plan Comments: (See PAT note from 12/18 by Sherlie Ban PA-C )         Anesthesia Quick Evaluation

## 2023-12-29 ENCOUNTER — Encounter (HOSPITAL_COMMUNITY): Payer: Self-pay | Admitting: Surgery

## 2023-12-29 ENCOUNTER — Ambulatory Visit (HOSPITAL_COMMUNITY): Payer: Medicare Other | Admitting: Medical

## 2023-12-29 ENCOUNTER — Encounter (HOSPITAL_COMMUNITY)
Admission: RE | Disposition: A | Discharge status: Home Health | Payer: Self-pay | Source: Ambulatory Visit | Attending: Surgery

## 2023-12-29 ENCOUNTER — Other Ambulatory Visit: Payer: Self-pay

## 2023-12-29 ENCOUNTER — Inpatient Hospital Stay (HOSPITAL_COMMUNITY)
Admission: RE | Admit: 2023-12-29 | Discharge: 2024-01-08 | DRG: 353 | Disposition: A | Discharge status: Home Health | Payer: Medicare Other | Source: Ambulatory Visit | Attending: Surgery | Admitting: Surgery

## 2023-12-29 DIAGNOSIS — K435 Parastomal hernia without obstruction or  gangrene: Secondary | ICD-10-CM | POA: Diagnosis not present

## 2023-12-29 DIAGNOSIS — E611 Iron deficiency: Secondary | ICD-10-CM | POA: Diagnosis not present

## 2023-12-29 DIAGNOSIS — K56609 Unspecified intestinal obstruction, unspecified as to partial versus complete obstruction: Secondary | ICD-10-CM | POA: Diagnosis not present

## 2023-12-29 DIAGNOSIS — E785 Hyperlipidemia, unspecified: Secondary | ICD-10-CM

## 2023-12-29 DIAGNOSIS — R29898 Other symptoms and signs involving the musculoskeletal system: Secondary | ICD-10-CM | POA: Diagnosis present

## 2023-12-29 DIAGNOSIS — E876 Hypokalemia: Secondary | ICD-10-CM | POA: Diagnosis not present

## 2023-12-29 DIAGNOSIS — D62 Acute posthemorrhagic anemia: Secondary | ICD-10-CM | POA: Diagnosis not present

## 2023-12-29 DIAGNOSIS — I2510 Atherosclerotic heart disease of native coronary artery without angina pectoris: Secondary | ICD-10-CM

## 2023-12-29 DIAGNOSIS — Z22321 Carrier or suspected carrier of Methicillin susceptible Staphylococcus aureus: Secondary | ICD-10-CM

## 2023-12-29 DIAGNOSIS — K433 Parastomal hernia with obstruction, without gangrene: Secondary | ICD-10-CM

## 2023-12-29 DIAGNOSIS — E43 Unspecified severe protein-calorie malnutrition: Secondary | ICD-10-CM | POA: Diagnosis not present

## 2023-12-29 DIAGNOSIS — K430 Incisional hernia with obstruction, without gangrene: Principal | ICD-10-CM | POA: Diagnosis present

## 2023-12-29 DIAGNOSIS — M109 Gout, unspecified: Secondary | ICD-10-CM | POA: Diagnosis present

## 2023-12-29 DIAGNOSIS — I480 Paroxysmal atrial fibrillation: Principal | ICD-10-CM | POA: Diagnosis present

## 2023-12-29 DIAGNOSIS — K219 Gastro-esophageal reflux disease without esophagitis: Secondary | ICD-10-CM | POA: Diagnosis present

## 2023-12-29 DIAGNOSIS — G9341 Metabolic encephalopathy: Secondary | ICD-10-CM | POA: Insufficient documentation

## 2023-12-29 DIAGNOSIS — I4729 Other ventricular tachycardia: Secondary | ICD-10-CM | POA: Diagnosis not present

## 2023-12-29 DIAGNOSIS — F05 Delirium due to known physiological condition: Secondary | ICD-10-CM | POA: Diagnosis not present

## 2023-12-29 DIAGNOSIS — I959 Hypotension, unspecified: Secondary | ICD-10-CM | POA: Diagnosis not present

## 2023-12-29 DIAGNOSIS — I129 Hypertensive chronic kidney disease with stage 1 through stage 4 chronic kidney disease, or unspecified chronic kidney disease: Secondary | ICD-10-CM | POA: Diagnosis present

## 2023-12-29 DIAGNOSIS — R0902 Hypoxemia: Secondary | ICD-10-CM | POA: Diagnosis not present

## 2023-12-29 DIAGNOSIS — K567 Ileus, unspecified: Secondary | ICD-10-CM | POA: Insufficient documentation

## 2023-12-29 DIAGNOSIS — Z9221 Personal history of antineoplastic chemotherapy: Secondary | ICD-10-CM

## 2023-12-29 DIAGNOSIS — Z95828 Presence of other vascular implants and grafts: Secondary | ICD-10-CM

## 2023-12-29 DIAGNOSIS — I472 Ventricular tachycardia, unspecified: Secondary | ICD-10-CM | POA: Diagnosis not present

## 2023-12-29 DIAGNOSIS — I10 Essential (primary) hypertension: Secondary | ICD-10-CM | POA: Diagnosis present

## 2023-12-29 DIAGNOSIS — I4891 Unspecified atrial fibrillation: Secondary | ICD-10-CM | POA: Diagnosis not present

## 2023-12-29 DIAGNOSIS — E669 Obesity, unspecified: Secondary | ICD-10-CM | POA: Diagnosis present

## 2023-12-29 DIAGNOSIS — I4892 Unspecified atrial flutter: Secondary | ICD-10-CM | POA: Diagnosis not present

## 2023-12-29 DIAGNOSIS — G14 Postpolio syndrome: Secondary | ICD-10-CM | POA: Diagnosis present

## 2023-12-29 DIAGNOSIS — Z800 Family history of malignant neoplasm of digestive organs: Secondary | ICD-10-CM

## 2023-12-29 DIAGNOSIS — Z85820 Personal history of malignant melanoma of skin: Secondary | ICD-10-CM

## 2023-12-29 DIAGNOSIS — N178 Other acute kidney failure: Secondary | ICD-10-CM | POA: Diagnosis not present

## 2023-12-29 DIAGNOSIS — Z951 Presence of aortocoronary bypass graft: Secondary | ICD-10-CM | POA: Diagnosis not present

## 2023-12-29 DIAGNOSIS — Z981 Arthrodesis status: Secondary | ICD-10-CM

## 2023-12-29 DIAGNOSIS — Z9079 Acquired absence of other genital organ(s): Secondary | ICD-10-CM

## 2023-12-29 DIAGNOSIS — Z906 Acquired absence of other parts of urinary tract: Secondary | ICD-10-CM

## 2023-12-29 DIAGNOSIS — Z7901 Long term (current) use of anticoagulants: Secondary | ICD-10-CM

## 2023-12-29 DIAGNOSIS — G5692 Unspecified mononeuropathy of left upper limb: Secondary | ICD-10-CM | POA: Diagnosis present

## 2023-12-29 DIAGNOSIS — Z8551 Personal history of malignant neoplasm of bladder: Secondary | ICD-10-CM

## 2023-12-29 DIAGNOSIS — Z885 Allergy status to narcotic agent status: Secondary | ICD-10-CM

## 2023-12-29 DIAGNOSIS — G589 Mononeuropathy, unspecified: Secondary | ICD-10-CM | POA: Diagnosis present

## 2023-12-29 DIAGNOSIS — N179 Acute kidney failure, unspecified: Secondary | ICD-10-CM | POA: Diagnosis present

## 2023-12-29 DIAGNOSIS — Z8612 Personal history of poliomyelitis: Secondary | ICD-10-CM | POA: Diagnosis present

## 2023-12-29 DIAGNOSIS — N1832 Chronic kidney disease, stage 3b: Secondary | ICD-10-CM | POA: Diagnosis present

## 2023-12-29 DIAGNOSIS — Z6826 Body mass index (BMI) 26.0-26.9, adult: Secondary | ICD-10-CM

## 2023-12-29 DIAGNOSIS — N172 Acute kidney failure with medullary necrosis: Secondary | ICD-10-CM | POA: Diagnosis not present

## 2023-12-29 DIAGNOSIS — Z79899 Other long term (current) drug therapy: Secondary | ICD-10-CM

## 2023-12-29 DIAGNOSIS — E782 Mixed hyperlipidemia: Secondary | ICD-10-CM | POA: Diagnosis not present

## 2023-12-29 DIAGNOSIS — Z8249 Family history of ischemic heart disease and other diseases of the circulatory system: Secondary | ICD-10-CM

## 2023-12-29 DIAGNOSIS — D649 Anemia, unspecified: Secondary | ICD-10-CM | POA: Diagnosis present

## 2023-12-29 HISTORY — PX: XI ROBOTIC ASSISTED PARASTOMAL HERNIA REPAIR: SHX6540

## 2023-12-29 SURGERY — REPAIR, HERNIA, PARASTOMAL, ROBOT-ASSISTED
Anesthesia: General

## 2023-12-29 MED ORDER — METRONIDAZOLE 500 MG/100ML IV SOLN
500.0000 mg | Freq: Three times a day (TID) | INTRAVENOUS | Status: AC
Start: 2023-12-29 — End: 2023-12-30
  Administered 2023-12-29 – 2023-12-30 (×3): 500 mg via INTRAVENOUS
  Filled 2023-12-29 (×3): qty 100

## 2023-12-29 MED ORDER — LIDOCAINE HCL (PF) 2 % IJ SOLN
INTRAMUSCULAR | Status: DC | PRN
  Administered 2023-12-29: 1.5 mg/kg/h via INTRADERMAL

## 2023-12-29 MED ORDER — ONDANSETRON HCL 4 MG/2ML IJ SOLN
INTRAMUSCULAR | Status: DC | PRN
  Administered 2023-12-29: 4 mg via INTRAVENOUS

## 2023-12-29 MED ORDER — MAGNESIUM HYDROXIDE 400 MG/5ML PO SUSP: 30.0000 mL | Freq: Every day | ORAL | Status: DC | PRN

## 2023-12-29 MED ORDER — PROPOFOL 10 MG/ML IV BOLUS
INTRAVENOUS | Status: DC | PRN
  Administered 2023-12-29: 120 mg via INTRAVENOUS

## 2023-12-29 MED ORDER — METRONIDAZOLE 500 MG/100ML IV SOLN
500.0000 mg | INTRAVENOUS | Status: AC
  Administered 2023-12-29: 500 mg via INTRAVENOUS
  Filled 2023-12-29: qty 100

## 2023-12-29 MED ORDER — ACETAMINOPHEN 500 MG PO TABS
1000.0000 mg | ORAL_TABLET | ORAL | Status: AC
  Administered 2023-12-29: 1000 mg via ORAL
  Filled 2023-12-29: qty 2

## 2023-12-29 MED ORDER — ALUM & MAG HYDROXIDE-SIMETH 200-200-20 MG/5ML PO SUSP: 30.0000 mL | Freq: Four times a day (QID) | ORAL | Status: DC | PRN

## 2023-12-29 MED ORDER — PHENYLEPHRINE HCL (PRESSORS) 10 MG/ML IV SOLN
INTRAVENOUS | Status: AC
  Filled 2023-12-29: qty 1

## 2023-12-29 MED ORDER — METOPROLOL TARTRATE 5 MG/5ML IV SOLN
5.0000 mg | Freq: Four times a day (QID) | INTRAVENOUS | Status: DC | PRN
  Administered 2024-01-01: 5 mg via INTRAVENOUS
  Filled 2023-12-29: qty 5

## 2023-12-29 MED ORDER — BUPIVACAINE LIPOSOME 1.3 % IJ SUSP
INTRAMUSCULAR | Status: AC
  Filled 2023-12-29: qty 20

## 2023-12-29 MED ORDER — PSYLLIUM 95 % PO PACK
1.0000 | PACK | Freq: Two times a day (BID) | ORAL | Status: DC
  Administered 2023-12-29 – 2023-12-30 (×3): 1 via ORAL
  Filled 2023-12-29 (×3): qty 1

## 2023-12-29 MED ORDER — PROCHLORPERAZINE EDISYLATE 10 MG/2ML IJ SOLN
5.0000 mg | Freq: Four times a day (QID) | INTRAMUSCULAR | Status: DC | PRN
  Administered 2023-12-31 – 2024-01-02 (×2): 10 mg via INTRAVENOUS
  Filled 2023-12-29 (×2): qty 2

## 2023-12-29 MED ORDER — DEXAMETHASONE SODIUM PHOSPHATE 10 MG/ML IJ SOLN
INTRAMUSCULAR | Status: DC | PRN
  Administered 2023-12-29: 10 mg via INTRAVENOUS

## 2023-12-29 MED ORDER — ENSURE PRE-SURGERY PO LIQD: 296.0000 mL | Freq: Once | ORAL | Status: DC

## 2023-12-29 MED ORDER — MAGIC MOUTHWASH: 15.0000 mL | Freq: Four times a day (QID) | ORAL | Status: DC | PRN

## 2023-12-29 MED ORDER — DEXAMETHASONE SODIUM PHOSPHATE 10 MG/ML IJ SOLN
INTRAMUSCULAR | Status: AC
  Filled 2023-12-29: qty 1

## 2023-12-29 MED ORDER — SIMETHICONE 80 MG PO CHEW
40.0000 mg | CHEWABLE_TABLET | Freq: Four times a day (QID) | ORAL | Status: DC | PRN
  Administered 2023-12-31: 40 mg via ORAL
  Filled 2023-12-29: qty 1

## 2023-12-29 MED ORDER — KETAMINE HCL 10 MG/ML IJ SOLN
INTRAMUSCULAR | Status: DC | PRN
  Administered 2023-12-29: 30 mg via INTRAVENOUS

## 2023-12-29 MED ORDER — LACTATED RINGERS IV SOLN: INTRAVENOUS | Status: DC | PRN

## 2023-12-29 MED ORDER — PHENYLEPHRINE HCL-NACL 20-0.9 MG/250ML-% IV SOLN
INTRAVENOUS | Status: DC | PRN
  Administered 2023-12-29: 50 ug/min via INTRAVENOUS

## 2023-12-29 MED ORDER — MIDAZOLAM HCL 2 MG/2ML IJ SOLN
0.5000 mg | Freq: Once | INTRAMUSCULAR | Status: DC | PRN
Start: 2023-12-29 — End: 2023-12-29

## 2023-12-29 MED ORDER — ONDANSETRON HCL 4 MG/2ML IJ SOLN
4.0000 mg | Freq: Four times a day (QID) | INTRAMUSCULAR | Status: DC | PRN
Start: 2023-12-29 — End: 2024-01-08
  Administered 2023-12-31 – 2024-01-05 (×3): 4 mg via INTRAVENOUS
  Filled 2023-12-29 (×4): qty 2

## 2023-12-29 MED ORDER — KETAMINE HCL 50 MG/5ML IJ SOSY
PREFILLED_SYRINGE | INTRAMUSCULAR | Status: AC
  Filled 2023-12-29: qty 5

## 2023-12-29 MED ORDER — ROCURONIUM BROMIDE 10 MG/ML (PF) SYRINGE
PREFILLED_SYRINGE | INTRAVENOUS | Status: AC
  Filled 2023-12-29: qty 10

## 2023-12-29 MED ORDER — LIDOCAINE HCL (PF) 2 % IJ SOLN
INTRAMUSCULAR | Status: AC
  Filled 2023-12-29: qty 5

## 2023-12-29 MED ORDER — PROCHLORPERAZINE MALEATE 10 MG PO TABS: 10.0000 mg | ORAL_TABLET | Freq: Four times a day (QID) | ORAL | Status: DC | PRN

## 2023-12-29 MED ORDER — HYDROMORPHONE HCL 1 MG/ML IJ SOLN: 0.2500 mg | INTRAMUSCULAR | Status: DC | PRN

## 2023-12-29 MED ORDER — SIMVASTATIN 40 MG PO TABS
40.0000 mg | ORAL_TABLET | Freq: Every day | ORAL | Status: DC
  Administered 2023-12-29 – 2023-12-31 (×3): 40 mg via ORAL
  Filled 2023-12-29 (×3): qty 2

## 2023-12-29 MED ORDER — CHLORHEXIDINE GLUCONATE 0.12 % MT SOLN
15.0000 mL | Freq: Once | OROMUCOSAL | Status: AC
Start: 2023-12-29 — End: 2023-12-29
  Administered 2023-12-29: 15 mL via OROMUCOSAL

## 2023-12-29 MED ORDER — DIPHENHYDRAMINE HCL 50 MG/ML IJ SOLN: 12.5000 mg | Freq: Four times a day (QID) | INTRAMUSCULAR | Status: DC | PRN

## 2023-12-29 MED ORDER — PHENYLEPHRINE 80 MCG/ML (10ML) SYRINGE FOR IV PUSH (FOR BLOOD PRESSURE SUPPORT)
PREFILLED_SYRINGE | INTRAVENOUS | Status: AC
  Filled 2023-12-29: qty 10

## 2023-12-29 MED ORDER — HEPARIN SOD (PORK) LOCK FLUSH 100 UNIT/ML IV SOLN: 500.0000 [IU] | INTRAVENOUS | Status: DC | PRN

## 2023-12-29 MED ORDER — ENOXAPARIN SODIUM 40 MG/0.4ML IJ SOSY
40.0000 mg | PREFILLED_SYRINGE | INTRAMUSCULAR | Status: DC
  Administered 2023-12-30 – 2023-12-31 (×2): 40 mg via SUBCUTANEOUS
  Filled 2023-12-29 (×2): qty 0.4

## 2023-12-29 MED ORDER — HYDROMORPHONE HCL 1 MG/ML IJ SOLN
INTRAMUSCULAR | Status: AC
  Administered 2023-12-29: 0.25 mg via INTRAVENOUS
  Filled 2023-12-29: qty 1

## 2023-12-29 MED ORDER — PHENOL 1.4 % MT LIQD
2.0000 | OROMUCOSAL | Status: DC | PRN
  Filled 2023-12-29: qty 177

## 2023-12-29 MED ORDER — ONDANSETRON HCL 4 MG/2ML IJ SOLN
INTRAMUSCULAR | Status: AC
  Filled 2023-12-29: qty 2

## 2023-12-29 MED ORDER — GABAPENTIN 100 MG PO CAPS
300.0000 mg | ORAL_CAPSULE | Freq: Two times a day (BID) | ORAL | Status: DC
  Administered 2023-12-29 – 2023-12-30 (×4): 300 mg via ORAL
  Filled 2023-12-29 (×4): qty 3

## 2023-12-29 MED ORDER — ROCURONIUM BROMIDE 10 MG/ML (PF) SYRINGE
PREFILLED_SYRINGE | INTRAVENOUS | Status: DC | PRN
  Administered 2023-12-29: 60 mg via INTRAVENOUS
  Administered 2023-12-29: 20 mg via INTRAVENOUS

## 2023-12-29 MED ORDER — KCL IN DEXTROSE-NACL 20-5-0.45 MEQ/L-%-% IV SOLN
INTRAVENOUS | Status: AC
  Filled 2023-12-29: qty 1000

## 2023-12-29 MED ORDER — ACETAMINOPHEN 500 MG PO TABS
500.0000 mg | ORAL_TABLET | Freq: Four times a day (QID) | ORAL | Status: DC
  Administered 2023-12-29 – 2024-01-01 (×7): 500 mg via ORAL
  Filled 2023-12-29 (×8): qty 1

## 2023-12-29 MED ORDER — CEFAZOLIN SODIUM-DEXTROSE 2-4 GM/100ML-% IV SOLN
2.0000 g | INTRAVENOUS | Status: AC
  Administered 2023-12-29: 2 g via INTRAVENOUS
  Filled 2023-12-29: qty 100

## 2023-12-29 MED ORDER — GABAPENTIN 100 MG PO CAPS
200.0000 mg | ORAL_CAPSULE | ORAL | Status: AC
  Administered 2023-12-29: 200 mg via ORAL
  Filled 2023-12-29: qty 2

## 2023-12-29 MED ORDER — DIPHENHYDRAMINE HCL 12.5 MG/5ML PO ELIX: 12.5000 mg | ORAL_SOLUTION | Freq: Four times a day (QID) | ORAL | Status: DC | PRN

## 2023-12-29 MED ORDER — BISACODYL 10 MG RE SUPP: 10.0000 mg | Freq: Every day | RECTAL | Status: DC | PRN

## 2023-12-29 MED ORDER — EPHEDRINE 5 MG/ML INJ
INTRAVENOUS | Status: AC
  Filled 2023-12-29: qty 5

## 2023-12-29 MED ORDER — BUPIVACAINE-EPINEPHRINE 0.25% -1:200000 IJ SOLN
INTRAMUSCULAR | Status: DC | PRN
  Administered 2023-12-29: 50 mL

## 2023-12-29 MED ORDER — LACTATED RINGERS IV SOLN: INTRAVENOUS | Status: DC

## 2023-12-29 MED ORDER — ONDANSETRON 4 MG PO TBDP: 4.0000 mg | ORAL_TABLET | Freq: Four times a day (QID) | ORAL | Status: DC | PRN

## 2023-12-29 MED ORDER — FENTANYL CITRATE (PF) 100 MCG/2ML IJ SOLN
INTRAMUSCULAR | Status: DC | PRN
  Administered 2023-12-29: 100 ug via INTRAVENOUS

## 2023-12-29 MED ORDER — EPHEDRINE SULFATE-NACL 50-0.9 MG/10ML-% IV SOSY
PREFILLED_SYRINGE | INTRAVENOUS | Status: DC | PRN
  Administered 2023-12-29: 5 mg via INTRAVENOUS
  Administered 2023-12-29: 15 mg via INTRAVENOUS
  Administered 2023-12-29: 10 mg via INTRAVENOUS

## 2023-12-29 MED ORDER — SODIUM CHLORIDE 0.9 % IV SOLN: INTRAVENOUS | Status: DC | PRN

## 2023-12-29 MED ORDER — CHLORHEXIDINE GLUCONATE CLOTH 2 % EX PADS: 6.0000 | MEDICATED_PAD | Freq: Once | CUTANEOUS | Status: DC

## 2023-12-29 MED ORDER — ENSURE PRE-SURGERY PO LIQD: 592.0000 mL | Freq: Once | ORAL | Status: DC

## 2023-12-29 MED ORDER — FERROUS SULFATE 325 (65 FE) MG PO TABS
325.0000 mg | ORAL_TABLET | ORAL | Status: DC
Start: 2023-12-30 — End: 2024-01-01
  Administered 2023-12-30: 325 mg via ORAL
  Filled 2023-12-29: qty 1

## 2023-12-29 MED ORDER — LIDOCAINE HCL 2 % IJ SOLN
INTRAMUSCULAR | Status: AC
  Filled 2023-12-29: qty 20

## 2023-12-29 MED ORDER — ORAL CARE MOUTH RINSE: 15.0000 mL | Freq: Once | OROMUCOSAL | Status: AC

## 2023-12-29 MED ORDER — METHOCARBAMOL 1000 MG/10ML IJ SOLN: 1000.0000 mg | Freq: Four times a day (QID) | INTRAMUSCULAR | Status: DC | PRN

## 2023-12-29 MED ORDER — METOPROLOL TARTRATE 50 MG PO TABS
50.0000 mg | ORAL_TABLET | Freq: Two times a day (BID) | ORAL | Status: DC
  Administered 2023-12-29: 50 mg via ORAL
  Filled 2023-12-29: qty 1

## 2023-12-29 MED ORDER — MENTHOL 3 MG MT LOZG: 1.0000 | LOZENGE | OROMUCOSAL | Status: DC | PRN

## 2023-12-29 MED ORDER — ALLOPURINOL 100 MG PO TABS
300.0000 mg | ORAL_TABLET | Freq: Every day | ORAL | Status: DC
  Administered 2023-12-29 – 2023-12-31 (×3): 300 mg via ORAL
  Filled 2023-12-29 (×3): qty 1

## 2023-12-29 MED ORDER — METHOCARBAMOL 500 MG PO TABS: 500.0000 mg | ORAL_TABLET | Freq: Four times a day (QID) | ORAL | Status: DC | PRN

## 2023-12-29 MED ORDER — HYDROMORPHONE HCL 1 MG/ML IJ SOLN
0.5000 mg | INTRAMUSCULAR | Status: DC | PRN
  Administered 2023-12-29 – 2023-12-31 (×2): 1 mg via INTRAVENOUS
  Filled 2023-12-29 (×2): qty 1

## 2023-12-29 MED ORDER — FLUTICASONE PROPIONATE 50 MCG/ACT NA SUSP: 2.0000 | Freq: Every day | NASAL | Status: DC | PRN

## 2023-12-29 MED ORDER — FENTANYL CITRATE (PF) 100 MCG/2ML IJ SOLN
INTRAMUSCULAR | Status: AC
  Filled 2023-12-29: qty 2

## 2023-12-29 MED ORDER — PROPOFOL 10 MG/ML IV BOLUS
INTRAVENOUS | Status: AC
  Filled 2023-12-29: qty 20

## 2023-12-29 MED ORDER — HYDROCODONE-ACETAMINOPHEN 5-325 MG PO TABS
1.0000 | ORAL_TABLET | ORAL | Status: DC | PRN
  Administered 2023-12-29 – 2023-12-30 (×2): 2 via ORAL
  Filled 2023-12-29 (×2): qty 2

## 2023-12-29 MED ORDER — BUPIVACAINE LIPOSOME 1.3 % IJ SUSP: 20.0000 mL | Freq: Once | INTRAMUSCULAR | Status: DC

## 2023-12-29 MED ORDER — BUPIVACAINE LIPOSOME 1.3 % IJ SUSP
INTRAMUSCULAR | Status: DC | PRN
  Administered 2023-12-29: 20 mL

## 2023-12-29 MED ORDER — PHENYLEPHRINE 80 MCG/ML (10ML) SYRINGE FOR IV PUSH (FOR BLOOD PRESSURE SUPPORT)
PREFILLED_SYRINGE | INTRAVENOUS | Status: DC | PRN
  Administered 2023-12-29 (×2): 240 ug via INTRAVENOUS
  Administered 2023-12-29 (×2): 160 ug via INTRAVENOUS

## 2023-12-29 MED ORDER — LIDOCAINE 2% (20 MG/ML) 5 ML SYRINGE
INTRAMUSCULAR | Status: DC | PRN
  Administered 2023-12-29: 40 mg via INTRAVENOUS

## 2023-12-29 MED ORDER — BUPIVACAINE-EPINEPHRINE 0.25% -1:200000 IJ SOLN
INTRAMUSCULAR | Status: AC
  Filled 2023-12-29: qty 1

## 2023-12-29 MED ORDER — LACTATED RINGERS IV SOLN: Freq: Three times a day (TID) | INTRAVENOUS | Status: DC | PRN

## 2023-12-29 MED ORDER — CEFAZOLIN SODIUM-DEXTROSE 2-4 GM/100ML-% IV SOLN
2.0000 g | Freq: Three times a day (TID) | INTRAVENOUS | Status: AC
  Administered 2023-12-29 – 2023-12-30 (×2): 2 g via INTRAVENOUS
  Filled 2023-12-29 (×2): qty 100

## 2023-12-29 SURGICAL SUPPLY — 53 items
APPLICATOR COTTON TIP 6 STRL (MISCELLANEOUS) ×4 IMPLANT
APPLICATOR COTTON TIP 6IN STRL (MISCELLANEOUS) ×2
BAG COUNTER SPONGE SURGICOUNT (BAG) IMPLANT
BINDER ABDOMINAL 12 ML 46-62 (SOFTGOODS) IMPLANT
BLADE SURG SZ11 CARB STEEL (BLADE) ×2 IMPLANT
CHLORAPREP W/TINT 26 (MISCELLANEOUS) ×2 IMPLANT
COVER SURGICAL LIGHT HANDLE (MISCELLANEOUS) ×2 IMPLANT
COVER TIP SHEARS 8 DVNC (MISCELLANEOUS) ×2 IMPLANT
DEVICE TROCAR PUNCTURE CLOSURE (ENDOMECHANICALS) IMPLANT
DRAPE ARM DVNC X/XI (DISPOSABLE) ×8 IMPLANT
DRAPE COLUMN DVNC XI (DISPOSABLE) ×2 IMPLANT
DRIVER NDL LRG 8 DVNC XI (INSTRUMENTS) ×4 IMPLANT
DRIVER NDL MEGA SUTCUT DVNCXI (INSTRUMENTS) ×4 IMPLANT
DRIVER NDLE LRG 8 DVNC XI (INSTRUMENTS) ×2
DRIVER NDLE MEGA SUTCUT DVNCXI (INSTRUMENTS) ×2
DRSG TEGADERM 2-3/8X2-3/4 SM (GAUZE/BANDAGES/DRESSINGS) IMPLANT
ELECT REM PT RETURN 15FT ADLT (MISCELLANEOUS) ×2 IMPLANT
GAUZE SPONGE 2X2 8PLY STRL LF (GAUZE/BANDAGES/DRESSINGS) IMPLANT
GLOVE ECLIPSE 8.0 STRL XLNG CF (GLOVE) ×4 IMPLANT
GLOVE INDICATOR 8.0 STRL GRN (GLOVE) ×4 IMPLANT
GOWN STRL REUS W/ TWL XL LVL3 (GOWN DISPOSABLE) ×4 IMPLANT
GRASPER TIP-UP FEN DVNC XI (INSTRUMENTS) ×2 IMPLANT
IRRIG SUCT STRYKERFLOW 2 WTIP (MISCELLANEOUS)
IRRIGATION SUCT STRKRFLW 2 WTP (MISCELLANEOUS) IMPLANT
KIT BASIN OR (CUSTOM PROCEDURE TRAY) ×2 IMPLANT
KIT TURNOVER KIT A (KITS) IMPLANT
MESH VENTRALIGHT ST 10X13IN (Mesh General) IMPLANT
NDL HYPO 22X1.5 SAFETY MO (MISCELLANEOUS) ×2 IMPLANT
NDL INSUFFLATION 14GA 120MM (NEEDLE) ×2 IMPLANT
NEEDLE HYPO 22X1.5 SAFETY MO (MISCELLANEOUS) ×1
NEEDLE INSUFFLATION 14GA 120MM (NEEDLE) ×1
PACK CARDIOVASCULAR III (CUSTOM PROCEDURE TRAY) ×2 IMPLANT
PAD POSITIONING PINK XL (MISCELLANEOUS) ×2 IMPLANT
POUCH UROSTOMY 11/2 CONVEX (OSTOMY) IMPLANT
SCISSORS LAP 5X35 DISP (ENDOMECHANICALS) IMPLANT
SCISSORS MNPLR CVD DVNC XI (INSTRUMENTS) ×2 IMPLANT
SEAL UNIV 5-12 XI (MISCELLANEOUS) ×6 IMPLANT
SOL ELECTROSURG ANTI STICK (MISCELLANEOUS) ×1
SOLUTION ELECTROSURG ANTI STCK (MISCELLANEOUS) ×2 IMPLANT
SPIKE FLUID TRANSFER (MISCELLANEOUS) ×2 IMPLANT
SUT MNCRL AB 4-0 PS2 18 (SUTURE) ×2 IMPLANT
SUT PDS AB 1 CT1 27 (SUTURE) IMPLANT
SUT STRATA PDS 2-0 30 CT-1.5 (SUTURE) ×3
SUT VIC AB 3-0 SH 27XBRD (SUTURE) IMPLANT
SUT VLOC 180 0 6IN GS21 (SUTURE) IMPLANT
SUT VLOC 180 2-0 6IN GS21 (SUTURE) IMPLANT
SUT VLOC 180 2-0 9IN GS21 (SUTURE) IMPLANT
SUT VLOC 3-0 9IN GRN (SUTURE) IMPLANT
SUTURE STRAT PDS 2-0 30 CT-1.5 (SUTURE) IMPLANT
SYR 10ML LL (SYRINGE) ×2 IMPLANT
SYR 20ML LL LF (SYRINGE) ×2 IMPLANT
TOWEL OR 17X26 10 PK STRL BLUE (TOWEL DISPOSABLE) ×2 IMPLANT
TUBING INSUFFLATION 10FT LAP (TUBING) ×2 IMPLANT

## 2023-12-29 NOTE — Anesthesia Postprocedure Evaluation (Signed)
 Anesthesia Post Note  Patient: Christophor Eick  Procedure(s) Performed: XI ROBOTIC ASSISTED PARASTOMAL HERNIA REPAIR WITH MESH, INCISIONAL AND ILEAL CONDUIT, LYSIS OF ADHESIONS, BILATERAL TAP BLOCK     Patient location during evaluation: PACU Anesthesia Type: General Level of consciousness: awake and alert, patient cooperative and oriented Pain control: pain improving. Vital Signs Assessment: post-procedure vital signs reviewed and stable Respiratory status: spontaneous breathing, nonlabored ventilation and respiratory function stable Cardiovascular status: blood pressure returned to baseline and stable Postop Assessment: no apparent nausea or vomiting Anesthetic complications: no   No notable events documented.  Last Vitals:  Vitals:   12/29/23 1345 12/29/23 1409  BP: 105/66 109/65  Pulse: 82 83  Resp: 18 16  Temp: 36.4 C (!) 36.3 C  SpO2: 98% 100%    Last Pain:  Vitals:   12/29/23 1409  TempSrc: Oral  PainSc: 10-Worst pain ever                 Analuisa Tudor,E. Nazifa Trinka

## 2023-12-29 NOTE — H&P (Signed)
 12/29/2023   REFERRING PHYSICIAN: Alvaro Hummer, MD  Patient Care Team: Viktoria Stephane BRAVO, MD as PCP - General Alvaro Hummer, MD (Urology) Rogers Hai, MD (Oncology) Croitoru, Jerel, MD (Cardiovascular Disease)  PROVIDER: ELSPETH JUDAH SCHULTZE, MD  [facility] MRN: [id number] DOB: 1946/03/10  SUBJECTIVE   Chief Complaint: New Consultation ( Repair of ventral and parastomal hernias)   Hunter [name] is a 78 y.o. male  who is seen today as an office consultation  at the request of DrRONITA Alvaro  for evaluation of parastomal hernia around ileal conduit and incisional hernias  History of Present Illness:  78 year old male with history of high-grade urothelial bladder cancer. Underwent neoadjuvant chemotherapy and then robotic radical cystoprostatectomy and ileal conduit formation in February 2022 by Dr. Rosalynn Alvaro. 2-year follow-up notes no recurrence. Followed by Dr. Rogers with oncology patient's had incisional and parastomal hernias for the past year. Been monitored by urology. Sounds like there are concerns about hernias more. Issues with pouching around the urostomy. Followed by wound ostomy consulting nurse team and ostomy clinic. Surgical consultation requested. Patient recalls being dissuaded from consenting hernia repair but then had an episode of sharper pain in his abdomen at the hernia for several days that concerned him and especially his wife. They wish to consider.  Patient has a distant history of atrial fibrillation coronary disease with CABG bypass in 2013. Followed by [facility]. Was anticoagulated divet is known no blood thinners at this point. No longer in atrial fibrillation. Claims he walk in half hour without difficulty. 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 He had some right cervical neck surgery and claims some weakness in his right upper extremity now as well. Usually moves his  bowels every couple days. Those days often has a series of a few. No tobacco no diabetes. No sleep apnea. He gets some urinary colonization of MSSA - methicillin sensitive Staph aureus. Never any MRSA.  Ready for surgery    Medical History:  No past medical history on file.  Patient Active Problem List  Diagnosis  Personal history of malignant melanoma of skin  CAD (coronary artery disease)  Chronic anticoagulation  Complication of Ileal conduit (CMS-HCC)  Esophageal reflux  Essential hypertension  Irritant contact dermatitis associated with urinary stoma  Malignant neoplasm of bladder, unspecified (CMS/HHS-HCC)  Paroxysmal atrial fibrillation (CMS/HHS-HCC)  S/P CABG x 4  Parastomal hernia of ileal conduit  Incisional hernia, without obstruction or gangrene  Personal history of bladder cancer   No past surgical history on file.   Allergies  Allergen Reactions  Amiodarone  Hallucination  Hallucinations  Oxycodone  Other (See Comments)  Bad dreams   Current Outpatient Medications on File Prior to Visit  Medication Sig Dispense Refill  allopurinol  (ZYLOPRIM ) 300 MG tablet Take 300 mg by mouth once daily.  metoprolol  tartrate (LOPRESSOR ) 25 MG tablet Take 25 mg by mouth 2 (two) times daily.  simvastatin  (ZOCOR ) 40 MG tablet Take 40 mg by mouth nightly.  acetaminophen -codeine (TYLENOL  #3) 300-30 mg tablet One to two tablets by mouth every four to six hours as needed for pain 12 tablet 0  aspirin  81 MG EC tablet Take 81 mg by mouth once daily.  niacin  500 MG tablet Take 500 mg by mouth daily with breakfast.  omega-3 fatty acids -fish oil 360-1,200 mg Cap Take by mouth.  omeprazole  (PRILOSEC) 20 MG DR capsule Take 20 mg by mouth once daily.  triamterene -hydrochlorothiazide  (DYAZIDE ) 37.5-25 mg capsule Take  1 capsule by mouth every morning.   No current facility-administered medications on file prior to visit.   No family history on file.   Social History   Tobacco Use   Smoking Status Never  Smokeless Tobacco Never    Social History   Socioeconomic History  Marital status: Married  Tobacco Use  Smoking status: Never  Smokeless tobacco: Never  Substance and Sexual Activity  Alcohol use: Yes  Comment: 2 drinks a month  Drug use: Never   Social Drivers of Health   Food Insecurity: No Food Insecurity (02/28/2023)  Received from [facility]  Hunger Vital Sign  Worried About Running Out of Food in the Last Year: Never true  Ran Out of Food in the Last Year: Never true  Transportation Needs: No Transportation Needs (02/28/2023)  Received from [facility] - Transportation  Lack of Transportation (Medical): No  Lack of Transportation (Non-Medical): No   ############################################################  Review of Systems: A complete review of systems (ROS) was obtained from the patient.  We have reviewed this information and discussed as appropriate with the patient.  See HPI as well for other pertinent ROS.  Constitutional: No fevers, chills, sweats. Weight stable Eyes: No vision changes, No discharge HENT: No sore throats, nasal drainage Lymph: No neck swelling, No bruising easily Pulmonary: No cough, productive sputum CV: No orthopnea, PND . No exertional chest/neck/shoulder/arm pain. Patient can walk 30 minutes without difficulty.   GI: No personal nor family history of GI/colon cancer, inflammatory bowel disease, irritable bowel syndrome, allergy such as Celiac Sprue, dietary/dairy problems, colitis, ulcers nor gastritis. No recent sick contacts/gastroenteritis. No travel outside the country. No changes in diet.  Renal: No UTIs, No hematuria Genital: No drainage, bleeding, masses Musculoskeletal: No severe joint pain. Upper extremity is noted with decreased range of motion and function. Otherwise good ROM major joints Skin: No sores or lesions Heme/Lymph: No easy bleeding. No swollen lymph nodes Neuro: History of upper  extremity weakness with polio as child no active seizures. No facial droop Psych: No hallucinations. No agitation  OBJECTIVE   Vitals:  10/24/23 1425  BP: 131/75  Pulse: 94  Temp: 36.7 C (98 F)  Weight: 82.1 kg (181 lb)  Height: 175.3 cm (5' 9)   Body mass index is 26.73 kg/m.  PHYSICAL EXAM:  Constitutional: Not cachectic. Hygeine adequate. Vitals signs as above.  Eyes: No glasses. Vision adequate,Pupils reactive, normal extraocular movements. Sclera nonicteric Neuro: CN II-XII intact. Decreased shrug on left shoulder. Decreased handgrip and arm flexion of left upper extremity. Consistent with his history of polio. No major focal sensory defects. No major motor deficits. Lymph: No head/neck/groin lymphadenopathy Psych: No severe agitation. No severe anxiety. Judgment & insight Adequate, Oriented x4, HENT: Normocephalic, Mucus membranes moist. No thrush. Hearing: adequate Neck: Supple, No tracheal deviation. No obvious thyromegaly Chest: No pain to chest wall compression. Good respiratory excursion. No audible wheezing CV: Pulses intact. regular. No major extremity edema Ext: No obvious deformity or contracture. Edema: Not present. No cyanosis Skin: No major subcutaneous nodules. Warm and dry Musculoskeletal: Severe joint rigidity not present. Decreased use of left upper extremity as noted above. No obvious clubbing. No digital petechiae. Mobility: no assist device moving easily without restrictions  Abdomen: Flat Soft. Nondistended. Nontender. Hernia: Midline periumbilically is a 6 x 5 cm region of hernia that is mildly sensitive but reducible. In the right infraumbilical region is a ileal conduit urostomy. He has bulging lateral that 4 x 4 cm as  well. Reducible.. Diastasis recti: Not present. No hepatomegaly. No splenomegaly.  Genital/Pelvic: Inguinal hernia: Not present. Inguinal lymph nodes: without lymphadenopathy nor hidradenitis.   Rectal:  (Deferred)    ###################################################################  Labs, Imaging and Diagnostic Testing:  Located in 'Care Everywhere' section of Epic EMR chart  PRIOR [facility] CLINIC NOTES:  Not applicable  SURGERY NOTES:  Located in 'Care Everywhere' section of Epic EMR chart  PATHOLOGY:  Located in 'Care Everywhere' section of Epic EMR chart  Assessment and Plan:  DIAGNOSES:  Diagnoses and all orders for this visit:  Parastomal hernia of ileal conduit  Incisional hernia, without obstruction or gangrene  Personal history of bladder cancer  Coronary artery disease involving native coronary artery of native heart without angina pectoris    ASSESSMENT/PLAN  Pleasant gentleman with incisional and parastomal hernias around ileal conduit with 1 episode of sharp pain for several days and CT scan showing small bowel within both hernias.  I noted standard of care with hernias that are becoming symptomatic containing bowel and is to do surgical repair. Would recommend robotic/laparoscopic approach for reduction and mesh underlay repair. Most likely will have to do a larger sheet to cover both hernia defects with the right lateral edge acting as a Sugarbaker type patch to hammock the ileal urostomy conduit more laterally. Sometimes it is a challenge to do that depending on laxity. May have to do a keyhole type situation if it is rigid but usually we can avoid that. Usually overnight observation given complexity of case [facility] so that urology is available.  The anatomy & physiology of the abdominal wall was discussed. The pathophysiology of hernias was discussed. Natural history risks without surgery including progeressive enlargement, pain, incarceration, & strangulation was discussed. Contributors to complications such as smoking, obesity, diabetes, prior surgery, etc were discussed.   I feel the risks of no intervention will lead to serious problems that  outweigh the operative risks; therefore, I recommended surgery to reduce and repair the hernia. I explained laparoscopic techniques with possible need for an open approach. I noted the probable use of mesh to patch and/or buttress the hernia repair  Risks such as bleeding, infection, abscess, need for further treatment, injury to other organs, need for repair of tissues / organs, stroke, heart attack, death, and other risks were discussed. I noted a good likelihood this will help address the problem. Goals of post-operative recovery were discussed as well. Possibility that this will not correct all symptoms was explained. I stressed the importance of low-impact activity, aggressive pain control, avoiding constipation, & not pushing through pain to minimize risk of post-operative chronic pain or injury. Possibility of reherniation especially with smoking, obesity, diabetes, immunosuppression, and other health conditions was discussed. We will work to minimize complications.   An educational handout further explaining the pathology & treatment options was given as well. Questions were answered. The patient expresses understanding & wishes to proceed with surgery.  He claims he has been told he has UTIs. He has had some MSSA noted. No major UTIs with sepsis. Some occasional antibiotics. Will discuss with his urologist but I suspect this is more chronic colonization than anything else.  He has a distant history of coronary disease and atrial fibrillation but has good performance status and is rate control metoprolol  only. No longer on any anticoagulation. I am pretty sure anesthesia would like cardiac clearance just to be safe. He got through the larger radical cystectomy prostatectomy, self leave this is less risky.  Elspeth KYM Schultze, MD, FACS, MASCRS Esophageal, Gastrointestinal & Colorectal Surgery Robotic and Minimally Invasive Surgery  [facility] [address] [place], [place] [address] [phone] Fax [phone] Main  CONTACT INFORMATION: Weekday (9AM-5PM): Call [facility] main office at [phone] Weeknight (5PM-9AM) or Weekend/Holiday: Check EPIC Web Links tab & use AMION (password  [email or web address]) for General Surgery [facility] coverage  Please, DO NOT use SecureChat  (it is not reliable communication to reach operating surgeons & will lead to a delay in care).   Epic staff messaging available for outptient concerns needing 1-2 business day response.      12/29/2023

## 2023-12-29 NOTE — Interval H&P Note (Signed)
 History and Physical Interval Note:  12/29/2023 7:19 AM  Hunter [name]  has presented today for surgery, with the diagnosis of ventral incisional parastomal ileal conduit abdominal wall herniae.  The various methods of treatment have been discussed with the patient and family. After consideration of risks, benefits and other options for treatment, the patient has consented to  Procedure(s): XI ROBOTIC ASSISTED PARASTOMAL HERNIA REPAIR WITH MESH, INCISIONAL AND ILEAL CONDUIT (N/A) as a surgical intervention.  The patient's history has been reviewed, patient examined, no change in status, stable for surgery.  I have reviewed the patient's chart and labs.  Questions were answered to the patient's satisfaction.    I have re-reviewed the the patient's records, history, medications, and allergies.  I have re-examined the patient.  I again discussed intraoperative plans and goals of post-operative recovery.  The patient agrees to proceed.  Hunter [name]  07-14-1946 [id number]  Patient Care Team: Paola Greig CROME, FNP as PCP - General (Family Medicine) Croitoru, Jerel, MD as PCP - Cardiology (Cardiology) Croitoru, Jerel, MD as Attending Physician (Cardiology) Mayo, Darice LABOR, FNP as WOC Nurse (Nurse Practitioner) Rogers Hai, MD as Medical Oncologist (Medical Oncology) Alvaro Ricardo KATHEE Raddle., MD as Consulting Physician (Urology) Colon Shove, MD as Consulting Physician (Neurosurgery)  Patient Active Problem List   Diagnosis Date Noted   Unilateral inguinal hernia without obstruction or gangrene 10/24/2023   Irritant contact dermatitis associated with urinary stoma 10/04/2023   Complication of Ileal conduit (HCC) 10/04/2023   Cervical myelopathy with cervical radiculopathy (HCC) 04/11/2023   Bladder cancer (HCC) 02/04/2021   Sepsis with acute renal failure and septic shock (HCC)    Hyponatremia 11/10/2020   Acute lower UTI 11/10/2020   Acute renal failure superimposed on stage 3a  chronic kidney disease (HCC) 11/10/2020   Port-A-Cath in place 09/23/2020   Malignant neoplasm of urinary bladder (HCC) 09/03/2020   Goals of care, counseling/discussion 09/03/2020   Chronic anticoagulation, Xarelto  12/20/2012   NSVT (nonsustained ventricular tachycardia), 9 beats 12/23 12/18/2012   Abnormal nuclear cardiac imaging test as an OP 12/16/2012   Polio 12/16/2012   Exertional angina (HCC) 12/15/2012   CAD s/p CABG 12/15/2012   S/P CABG x 4, 12/12/12, LIMA-LAD;VG-OM1,OM2;VG-PD 12/15/2012   Paroxysmal atrial fibrillation (HCC) 12/15/2012   Hyperlipidemia 10/26/2010   GOUT 10/26/2010   MONONEURITIS OF UNSPECIFIED SITE 10/26/2010   Essential hypertension 10/26/2010   GERD 10/26/2010   NEPHROLITHIASIS, HX OF 10/26/2010    Past Medical History:  Diagnosis Date   Atrial fibrillation, rapid (HCC)    With high ventricular rates. Intol amio, was on Sotalol , now on BB   Bladder cancer (HCC) dx'd 10/2019   Bursitis of right shoulder    CAD (coronary artery disease) 11/2012   CABG x 4 using left internal mammary artery and right endovein harvest. (LIMA-LAD; VG-OM1, OM2, VG-PD), EF 55-60% at cath   Degenerative disc disease, lumbar    Dysrhythmia    Epigastric pain    Myoview 11/09/12 Showed evidence of reversible ischemia in both the inferior wall & the anteroapical distribution.    Erectile dysfunction    H/O ED while taking a beta-blocker   GERD (gastroesophageal reflux disease)    Gout    Hiatal hernia    History of kidney stones    Hyperlipidemia    Hypertension    Systemic HTN. Pt has H/O of ED when taking a beta-blocker. ECHO 11/26/08 Showed no evidence of any significant valvular disease, Estimated EF = 50-60%.   Mononeuritis of  unspecified site    Nocturia    NSVT (nonsustained ventricular tachycardia) (HCC)    Obesity    Osteoarthritis 08/2019   right knee; cortisone shot    PAF (paroxysmal atrial fibrillation) (HCC)    Post CABG. Amia intol. Was on Sotalol , now  on Lopressor    Polio    Age 78. 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, LIMA-LAD;VG-OM1,OM2;VG-PD 12/15/2012   Seasonal allergies    Skin cancer (melanoma) (HCC) 08/04/2015   left side of face    Skin lesion of face    hx of    Past Surgical History:  Procedure Laterality Date   ANTERIOR CERVICAL DECOMPRESSION/DISCECTOMY FUSION 4 LEVELS N/A 04/11/2023   Procedure: Cervical three-four Cervical four-five Cervical five-six Cervical six-seven Anterior Cervical Decompression Fusion;  Surgeon: Colon Shove, MD;  Location: [facility] OR;  Service: Neurosurgery;  Laterality: N/A;   COLONOSCOPY     CORONARY ARTERY BYPASS GRAFT  12/12/2012   CABG x 4 using left internal mammary artery and right endovein harvest. (LIMA-LAD; VG-OM1, OM2, VG-PD)   CYSTOSCOPY WITH INJECTION N/A 02/04/2021   Procedure: CYSTOSCOPY WITH INJECTION OF INDOCYANINE GREEN DYE;  Surgeon: Alvaro Hummer, MD;  Location: [facility]S;  Service: Urology;  Laterality: N/A;   EYE SURGERY  1996   had lasik surgery bilaterally   IR IMAGING GUIDED PORT INSERTION  09/10/2020   LEFT HEART CATHETERIZATION WITH CORONARY ANGIOGRAM N/A 12/08/2012   Procedure: LEFT HEART CATHETERIZATION WITH CORONARY ANGIOGRAM;  Surgeon: Jerel Balding, MD;  Location: [facility] CATH [facility];  Service: Cardiovascular;  Laterality: N/A;   LYMPHADENECTOMY Bilateral 02/04/2021   Procedure: LYMPHADENECTOMY;  Surgeon: Alvaro Hummer, MD;  Location: [facility];  Service: Urology;  Laterality: Bilateral;   ROBOT ASSISTED LAPAROSCOPIC COMPLETE CYSTECT ILEAL CONDUIT N/A 02/04/2021   Procedure: XI ROBOTIC ASSISTED LAPAROSCOPIC COMPLETE CYSTOPROSTATECTOMY ILEAL CONDUIT;  Surgeon: Alvaro Hummer, MD;  Location: [facility];  Service: Urology;  Laterality: N/A;  6 HRS   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   SKIN CANCER EXCISION  2016   left side of face    TRANSURETHRAL RESECTION OF BLADDER TUMOR N/A 11/16/2019   Procedure:  TRANSURETHRAL RESECTION OF BLADDER TUMOR (TURBT) WITH INSTILLATION OF POST OPERATIVE CHEMOTHERAPY;  Surgeon: Ottelin, Mark, MD;  Location: [facility];  Service: Urology;  Laterality: N/A;   TRANSURETHRAL RESECTION OF BLADDER TUMOR WITH MITOMYCIN -C N/A 08/12/2020   Procedure: TRANSURETHRAL RESECTION OF BLADDER TUMOR WITH GEMCITABINE ;  Surgeon: Elisabeth Valli BIRCH, MD;  Location: [facility];  Service: Urology;  Laterality: N/A;  1 HR   UPPER GI ENDOSCOPY      Social History   Socioeconomic History   Marital status: Married    Spouse name: Not on file   Number of children: 3   Years of education: Not on file   Highest education level: Not on file  Occupational History   Occupation: Musician (RETIRED)    Employer: [employer]  Tobacco Use   Smoking status: Never   Smokeless tobacco: Never  Vaping Use   Vaping status: Never Used  Substance and Sexual Activity   Alcohol use: Yes    Comment: 1 wine or 1 margarita once a month   Drug use: No   Sexual activity: Not on file  Other Topics Concern   Not on file  Social History Narrative   Not on file   Social Drivers of Health   Financial Resource Strain:  Not on file  Food Insecurity: No Food Insecurity (02/28/2023)   Hunger Vital Sign    Worried About Running Out of Food in the Last Year: Never true    Ran Out of Food in the Last Year: Never true  Transportation Needs: No Transportation Needs (02/28/2023)   PRAPARE - Administrator, Civil Service (Medical): No    Lack of Transportation (Non-Medical): No  Physical Activity: Not on file  Stress: Not on file  Social Connections: Not on file  Intimate Partner Violence: Not At Risk (02/28/2023)   Humiliation, Afraid, Rape, and Kick questionnaire    Fear of Current or Ex-Partner: No    Emotionally Abused: No    Physically Abused: No    Sexually Abused: No    Family History  Problem Relation Age of Onset   CAD Father    Hypertension Father    AAA (abdominal aortic  aneurysm) Father    Aneurysm Father        Cause of death   Heart attack Paternal Grandfather    Cancer Mother        Colon. Cause of death    Medications Prior to Admission  Medication Sig Dispense Refill Last Dose/Taking   acetaminophen  (TYLENOL ) 500 MG tablet Take 1,000 mg by mouth every 8 (eight) hours as needed.   Past Week   allopurinol  (ZYLOPRIM ) 300 MG tablet Take 300 mg by mouth daily.   12/28/2023   ferrous sulfate  325 (65 FE) MG tablet Take 325 mg by mouth 3 (three) times a week.   12/28/2023   fluticasone  (FLONASE ) 50 MCG/ACT nasal spray Place 2 sprays into both nostrils daily as needed for allergies.   12/28/2023 Morning   metoprolol  tartrate (LOPRESSOR ) 50 MG tablet Take 1 tablet (50 mg total) by mouth 2 (two) times daily. 180 tablet 3 12/29/2023 at  3:30 AM   simvastatin  (ZOCOR ) 40 MG tablet TAKE 1 TABLET BY MOUTH EVERYDAY AT BEDTIME 90 tablet 3 12/28/2023    Current Facility-Administered Medications  Medication Dose Route Frequency Provider Last Rate Last Admin   bupivacaine  liposome (EXPAREL ) 1.3 % injection 266 mg  20 mL Infiltration Once Sheldon Standing, MD       ceFAZolin  (ANCEF ) IVPB 2g/100 mL premix  2 g Intravenous On Call to OR Sheldon Standing, MD       And   metroNIDAZOLE  (FLAGYL ) IVPB 500 mg  500 mg Intravenous On Call to OR Sheldon Standing, MD       Chlorhexidine  Gluconate Cloth 2 % PADS 6 each  6 each Topical Once Sheldon Standing, MD       And   Chlorhexidine  Gluconate Cloth 2 % PADS 6 each  6 each Topical Once Sheldon Standing, MD       heparin  lock flush 100 unit/mL  500 Units Intracatheter Prior to discharge Sheldon Standing, MD       lactated ringers  infusion   Intravenous Continuous Hollis, Kevin D, MD 10 mL/hr at 12/29/23 0612 Continued from Pre-op at 12/29/23 0612     Allergies  Allergen Reactions   Amiodarone      Hallucinations    Oxycodone  Other (See Comments)    Bad dreams and hallucinations    BP 139/77   Pulse 72   Temp 98.1 F (36.7 C) (Oral)   Resp 18    Ht 5' 9 (1.753 m)   Wt 82.1 kg   SpO2 99%   BMI 26.73 kg/m   Labs: No results found for this  or any previous visit (from the past 48 hours).  Imaging / Studies: No results found.   Hunter [name], M.D., F.A.C.S. Gastrointestinal and Minimally Invasive Surgery [facility] [address], [address] [place], [place] [address] [phone] Main / Paging  12/29/2023 7:19 AM    Hunter [name]

## 2023-12-29 NOTE — Op Note (Signed)
 12/29/2023  PATIENT:  Hunter [name]  78 y.o. male  Patient Care Team: Paola Greig CROME, FNP as PCP - General (Family Medicine) Croitoru, Jerel, MD as PCP - Cardiology (Cardiology) Croitoru, Jerel, MD as Attending Physician (Cardiology) Mayo, Darice LABOR, FNP as WOC Nurse (Nurse Practitioner) Rogers Hai, MD as Medical Oncologist (Medical Oncology) Alvaro Ricardo KATHEE Mickey., MD as Consulting Physician (Urology) Colon Shove, MD as Consulting Physician (Neurosurgery) Sheldon Standing, MD as Consulting Physician (General Surgery)  PRE-OPERATIVE DIAGNOSIS:   Ventral incisional abdominal wall hernia 7x5cm Parastomal ileal conduit hernia   POST-OPERATIVE DIAGNOSIS:   Ventral incisional abdominal wall hernias 13x5cm region - incarcerated Parastomal ileal conduit hernia - incarcerated   PROCEDURE:   ROBOTIC TRANSVERSUS ABDOMINIS RELEASE (TAR) COMPONENT SEPARATION - UNILATERAL ROBOTIC REPAIR OF ABDOMINAL HERNIA WITH MESH  ROBOTIC PARASTOMAL HERNIA REPAIR OF ILEAL CONDUIT WITH MESH (SUGARBAKER) ROBOTIC LYSIS OD ADHESIONS COLON REPAIR TAP BLOCK - BILATERAL  SURGEON:  Standing KYM Sheldon, MD  ASSISTANT:  (n/a)   ANESTHESIA:  General endotracheal intubation anesthesia (GETA) and Regional TRANSVERSUS ABDOMINIS PLANE (TAP) nerve block -BILATERAL for perioperative & postoperative pain control at the level of the transverse abdominis & preperitoneal spaces along the flank at the anterior axillary line, from subcostal ridge to iliac crest under laparoscopic guidance provided with liposomal bupivacaine  (Experel) 20mL mixed with 50 mL of bupivicaine 0.25% with epinephrine   Estimated Blood Loss (EBL):   Total I/O In: 1650 [I.V.:1650] Out: 150 [Urine:100; Blood:50].   (See anesthesia record)  Delay start of Pharmacological VTE agent (>24hrs) due to concerns of significant anemia, surgical blood loss, or risk of bleeding?:  no  DRAINS: (None)  SPECIMEN:  (no specimen)  DISPOSITION OF SPECIMEN:   (not applicable)  COUNTS:  Sponge, needle, & instrument counts CORRECT   PLAN OF CARE: Admit for overnight observation  PATIENT DISPOSITION:  PACU - hemodynamically stable.  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  Has developed hernia around that area incarcerated.  Also supraumbilical midline incisional hernias containing bowel.  Worsening size with increasing pain and discomfort.  Recommendation was made for surgical repair  The anatomy & physiology of the abdominal wall was discussed.  The pathophysiology of hernias was discussed.  Natural history risks without surgery including progeressive enlargement, pain, incarceration, & strangulation was discussed.   Contributors to complications such as smoking, obesity, diabetes, prior surgery, etc were discussed.   I feel the risks of no intervention will lead to serious problems that outweigh the operative risks; therefore, I recommended surgery to reduce and repair the hernia.  I explained robotic & laparoscopic techniques with possible need for an open approach.  I noted the probable use of mesh to patch and/or buttress the hernia repair  Risks such as bleeding, infection, abscess, need for further treatment, injury to other organs, need for repair of tissues / organs, stroke, heart attack, death, and other risks were discussed.  I noted a good likelihood this will help address the problem.   Goals of post-operative recovery were discussed as well.  Possibility that this will not correct all symptoms was explained.  I stressed the importance of low-impact activity, aggressive pain control, avoiding constipation, & not pushing through pain to minimize risk of post-operative chronic pain or injury. Possibility of reherniation especially with smoking, obesity, diabetes, immunosuppression, and other health conditions was discussed.  We will work to minimize  complications.     An educational handout further explaining the pathology &  treatment options was given as well.  Questions were answered.  The patient expresses understanding & wishes to proceed with surgery.    OR FINDINGS: Parastomal hernia around ileal conduit incarcerated with omentum.  5 x 4 cm region.  Primarily closed.  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  13 x 5 cm region.  Right sided component separation TAR release done to allow largest 8x5cm defect to be primarily closed  Type of ventral wall repair:  Laparoscopic underlay repair .with Primary repair of largest hernia Placement of mesh: Retrorectus / Preperitoneal underlay repair Name of mesh: Bard Ventralight dual sided (polypropylene / Seprafilm) Size of mesh: 33x27cm Orientation:  Mostly vertical with slightly oblique to right lower quadrant Mesh overlap:  5-7cm     DESCRIPTION:   Informed consent was confirmed.  The patient underwent general anaesthesia without difficulty.  The patient was positioned appropriately.  VTE prevention in place.  The patient's abdomen was clipped, prepped, & draped in a sterile fashion.  Foley catheter was carefully placed into the right lower quadrant urostomy with balloon only partially filled to 5 mm.  Parthenia was used to cover most of the abdomen especially the urostomy and Foley.  Surgical timeout confirmed our plan.  Peritoneal entry with a laparoscopic port was obtained using Varess spring needle entry technique in the left upper abdomen as the patient was positioned in reverse Trendelenburg.  I induced carbon dioxide insufflation.  No change in end tidal CO2 measurements.  Full symmetrical abdominal distention.  Initial port was carefully placed.  Camera inspection revealed no injury.  Did do some focused sharp dissection to free adhesions off the left flank.  Extra ports were carefully placed under direct laparoscopic visualization.  Inspection noted a  small colotomy within a tic within an epiploic appendage that had been freed off.  I could see numerous adhesions and hernias in the midline and right lower quadrant.    Robot was carefully docked.  Primarily repaired to the 6 mm descending colon colotomy using 2 oh V-Loc absorbable suture in a running fashion.  Then came back and laid in epiploic appendage over it and so that down like an omentopexy but using a large epiploic appendage.  There had been 1 dot of stool only.  No other contamination.  Then focused on robotic lysis of adhesions to expose the entire anterior abdominal wall.  I primarily used focused sharp dissection.  Work to gradually reduce out a moderate wad of small bowel incarcerated in the supraumbilical midline incision.  Carefully freed out loop by loop and guided to reduce down.  Noted some Swiss cheese hernias periumbilically and somewhat infraumbilically as well.  I worked to reduce greater omentum that was incarcerated in the right lower quadrant urostomy ileal conduit.  Freed some adhesions of the urostomy to the abdominal wall and reduced a loop of 2 in there.  I then focused on running the small bowel from distal to proximal.  Freed off adhesions of the distal ileum to the ileal conduit.  I concede the 2 ureters going up into the ileal urostomy.  It was a mesenteric defect between the ileal conduit and the ileum for which she was donated.  Carefully mobilized around there.  I freed off interloop adhesions between small bowel and itself and some adhesions to the greater omentum as well as colon epiploic appendages.  Ran the bowel again and confirmed no injury other surprises.  The ileal ileal anastomosis that had been the donor site  for the ileal conduit rested in the right upper quadrant and the mesentery was more straight so I left it in situ there.  Focused on preperitoneal dissection.  I freed off the falciform ligament and central peritoneum to expose the retrorectus fascia  Came  through the peritoneum along the left rectus in the sagittal plane paramedian.  Toward the right side coming cross midline.  Freed the peritoneum towards the hernia sacs.  Could not get much hernia sac down signed up continuing on the peritoneum towards the right flank superiorly and inferiorly, taking care to avoid injury to the urostomy.  Once I had circumferential Freedom of the peritoneum around the urostomy and came through the peritoneum around that carefully to keep that ring down.  Noted with that the midline would not come together very well.  I therefore decided to do a component separation release on the right side.  A transversus abdominis release (TAR) was performed on the RIGHT side.  The transversus abdominis muscle was identified within the posterior rectus sheath and incised vertically along its entire length subcostally down to the suprapubic region., entering the pre-peritoneal or pre-transversalis fascia plane.  The peritoneum was subsequently peeled away from the underside of the divided transversus abdominis muscle.  This dissection was carried out laterally towards the retroperitoneum and was also carried out lateral to the colon.  The TAR accomplished additional medialization of the posterior rectus sheath with its attached peritoneum towards the midline to allow for visceral sac closure.  The TAR also provided further offset of tension with advancement of the rectus muscle towards the midline, as it remained attached to the external and internal abdominal oblique muscles. .  I focused on more mobilization of the urostomy of the conduit and the anterior abdominal wall.  Freed off adhesions to the ureters and mesentery and retroperitoneum carefully such that I got good stretch so that the urostomy could lay towards the right flank for planned Programmer, systems.  I made an opening in the peritoneum around the ileocolonic urostomy laterally and then closed from medial to lateral into the be  able to lateralize the conduit better.  I primarily closed the largest supraumbilical midline hernia defect robotically using 0 V-Loc in a running fashion, a stitch in each corner meeting in the middle with the patient on lower abdominal insufflation.  Came together with minimal tension.  I left the smaller Swiss cheese hernias periumbilically & inferiorly that it only contained a couple dots of omentum and falciform ligament and peritoneum alone at this point.  I made sure hemostasis was good.  Saw no injury or other concerns.  Because the colon repair was in the left lateral region away from where the mesh was planned to be placed the mesh was going to be retrorectus and not intraperitoneal,  I decided proceed with mesh placement.  To minimize recurrence & ensure that I would have at least 5 cm radial coverage beyond the right lower quadrant ileal conduit parastomal hernia as long as the midline Swiss cheese incisional hernias, I chose a 33x27cm dual sided mesh.  We rolled the mesh & placed into the peritoneal cavity through the left upper quadrant telemeter port site fascia.  I unrolled the mesh and positioned it appropriately.  I flipped the most inferior and lateral corner back upon itself to avoid any raw mesh exposure to the small bowel ileal urostomy conduit at the lateral corner.  I then secured the mesh to the right flank around the mid  axillary line using 2 oh V-Loc suture from the mesh to the anterior abdominal wall in a medial to lateral fashion just inferior to the ileal conduit urostomy.  Then did a similar running one just superior to it to have the mesh act as a hammock to lateralize the ileal conduit in the classic Sugarbaker fashion.  I then took those stitches and ran them superiorly and inferiorly to help tack the mesh laterally.  I then used some absorbable serrated suture to help close the peritoneum vertically more towards midline for some relaxation to allow the ileal conduit to go  laterally.  Sewed the left side of the mesh that was intraperitoneal as well.  Incorporated posterior rectus fascia as well.  Did irrigation and ensured hemostasis.  Good colon repair.  Ran the small bowel and saw no other surprises.  A few small mesenteric defects between the ureter and the ileal conduit but that had been there before and I decided to leave those alone to avoid any injury to the ureteral ileal conduit  Robot was undocked.  I closed the left upper quadrant 12 mm robotic port site using #1 PDS in a laparoscopic suture passer under guidance.  Did irrigation inspection confirmed good hemostasis.  Brought some greater omentum down to lay in the right side near the urostomy ileal conduit.  Completed bilateral tap blocks and local block. We evacuated CO2 & desufflated the abdomen.  . Ports were removed. The skin was closed with Monocryl at the port sites and Steri-Strips on the fascial stitch puncture sites.  Foley bilious desufflated and cath removed.  Urostomy appliance placed.  Patient is extubated recovery room in stable condition.  I discussed operative findings, updated the patient's status, discussed probable steps to recovery, and gave postoperative recommendations to the patient's spouse, Devere.  Recommendations were made.  Questions were answered.  She expressed understanding & appreciation.  Elspeth KYM Schultze, M.D., F.A.C.S. Gastrointestinal and Minimally Invasive Surgery [facility] [address], [address] [place], [place] [address] [phone] Main / Paging  12/29/2023 12:28 PM

## 2023-12-29 NOTE — Anesthesia Procedure Notes (Signed)
 Procedure Name: Intubation Date/Time: 12/29/2023 7:33 AM  Performed by: Carleton Garnette SAUNDERS, CRNAPre-anesthesia Checklist: Patient identified, Emergency Drugs available, Suction available, Patient being monitored and Timeout performed Patient Re-evaluated:Patient Re-evaluated prior to induction Oxygen Delivery Method: Circle system utilized Preoxygenation: Pre-oxygenation with 100% oxygen Induction Type: IV induction Ventilation: Mask ventilation without difficulty Laryngoscope Size: Mac, 4 and Glidescope (Used glidescope due to previous cervical surgery) Tube type: Oral Tube size: 7.5 mm Number of attempts: 1 Airway Equipment and Method: Stylet Placement Confirmation: ETT inserted through vocal cords under direct vision, positive ETCO2 and breath sounds checked- equal and bilateral Secured at: 23 cm Tube secured with: Tape Dental Injury: Teeth and Oropharynx as per pre-operative assessment

## 2023-12-29 NOTE — Discharge Instructions (Addendum)
 HERNIA REPAIR: POST OP INSTRUCTIONS  ######################################################################  EAT Gradually transition to a high fiber diet with a fiber supplement over the next few weeks after discharge.  Start with a pureed / full liquid diet (see below)  WALK Walk an hour a day.  Control your pain to do that.    CONTROL PAIN Control pain so that you can walk, sleep, tolerate sneezing/coughing, and go up/down stairs.  HAVE A BOWEL MOVEMENT DAILY Keep your bowels regular to avoid problems.  OK to try a laxative to override constipation.  OK to use an antidairrheal to slow down diarrhea.  Call if not better after 2 tries  CALL IF YOU HAVE PROBLEMS/CONCERNS Call if you are still struggling despite following these instructions. Call if you have concerns not answered by these instructions  ######################################################################    DIET: Follow a light bland diet & liquids the first 24 hours after arrival home, such as soup, liquids, starches, etc.  Be sure to drink plenty of fluids.  Quickly advance to a usual solid diet within a few days.  Avoid fast food or heavy meals as your are more likely to get nauseated or have irregular bowels.  A low-fat, high-fiber diet for the rest of your life is ideal.   Take your usually prescribed home medications unless otherwise directed.  PAIN CONTROL: Pain is best controlled by a usual combination of three different methods TOGETHER: Ice/Heat Over the counter pain medication Prescription pain medication Most patients will experience some swelling and bruising around the hernia(s) such as the bellybutton, groins, or old incisions.  Ice packs or heating pads (30-60 minutes up to 6 times a day) will help. Use ice for the first few days to help decrease swelling and bruising, then switch to heat to help relax tight/sore spots and speed recovery.  Some people prefer to use ice alone, heat alone, alternating  between ice & heat.  Experiment to what works for you.  Swelling and bruising can take several weeks to resolve.   It is helpful to take an over-the-counter pain medication regularly for the first few weeks.  Choose one of the following that works best for you: Naproxen (Aleve, etc)  Two 220mg  tabs twice a day Ibuprofen (Advil, etc) Three 200mg  tabs four times a day (every meal & bedtime) Acetaminophen  (Tylenol , etc) 325-650mg  four times a day (every meal & bedtime) A  prescription for pain medication should be given to you upon discharge.  Take your pain medication as prescribed.  If you are having problems/concerns with the prescription medicine (does not control pain, nausea, vomiting, rash, itching, etc), please call us  [phone] to see if we need to switch you to a different pain medicine that will work better for you and/or control your side effect better. If you need a refill on your pain medication, please contact your pharmacy.  They will contact our office to request authorization. Prescriptions will not be filled after 5 pm or on week-ends.  AVOID GETTING CONSTIPATED.   Between the surgery and the pain medications, it is common to experience some constipation.  Drink plenty of liquids Take a fiber supplement 2 times day (such as Metamucil, Citrucel, FiberCon, MiraLax , etc) to have a bowel movement every day. If you have not had a BM by 2 days after surgery: -drink liquids only until you have a bowel movement -take MiraLAX  2 doses every 2 hours until you have a bowel movement   Wash / shower every day.  You may  shower over the dressings as they are waterproof.    It is good for closed incisions and even open wounds to be washed every day.  Shower every day.  Short baths are fine.  Wash the incisions and wounds clean with soap & water .    You may leave closed incisions open to air if it is dry.   You may cover the incision with clean gauze & replace it after your daily shower for  comfort.  TEGADERM:  You have clear gauze band-aid dressings over your closed incision(s).  Remove the dressings 2 days after surgery = 1/4.    ACTIVITIES as tolerated:   You may resume regular (light) daily activities beginning the next day--such as daily self-care, walking, climbing stairs--gradually increasing activities as tolerated.  Control your pain so that you can walk an hour a day.  If you can walk 30 minutes without difficulty, it is safe to try more intense activity such as jogging, treadmill, bicycling, low-impact aerobics, swimming, etc. Save the most intensive and strenuous activity for last such as sit-ups, heavy lifting, contact sports, etc  Refrain from any heavy lifting or straining until you are off narcotics for pain control.   DO NOT PUSH THROUGH PAIN.  Let pain be your guide: If it hurts to do something, don't do it.  Pain is your body warning you to avoid that activity for another week until the pain goes down. You may drive when you are no longer taking prescription pain medication, you can comfortably wear a seatbelt, and you can safely maneuver your car and apply brakes. You may have sexual intercourse when it is comfortable.   FOLLOW UP in our office Please call [facility] at [phone] to set up an appointment to see your surgeon in the office for a follow-up appointment approximately 2-3 weeks after your surgery. Make sure that you call for this appointment the day you arrive home to insure a convenient appointment time.  9.  If you have disability of FMLA / Family leave forms, please bring the forms to the office for processing.  (do not give to your surgeon).  WHEN TO CALL US  [phone]: Poor pain control Reactions / problems with new medications (rash/itching, nausea, etc)  Fever over 101.5 F (38.5 C) Inability to urinate Nausea and/or vomiting Worsening swelling or bruising Continued bleeding from incision. Increased pain, redness, or drainage from the  incision   The clinic staff is available to answer your questions during regular business hours (8:30am-5pm).  Please don't hesitate to call and ask to speak to one of our nurses for clinical concerns.   If you have a medical emergency, go to the nearest emergency room or call 911.  A surgeon from [facility] is always on call at the hospitals in [facility], [place] [address], [place], [place]  [address] ?  [address] [address], [place], [place]   [address] MAIN: [phone] ? TOLL FREE: [phone] ? FAX: [phone] [email or web address] Information on my medicine - ELIQUIS  (apixaban )  This medication education was reviewed with me or my healthcare representative as part of my discharge preparation.   Why was Eliquis  prescribed for you? Eliquis  was prescribed for you to reduce the risk of a blood clot forming that can cause a stroke if you have a medical condition called atrial fibrillation (a type of irregular heartbeat).  What do You need to know about Eliquis  ? Take your Eliquis  TWICE DAILY -  one tablet in the morning and one tablet in the evening with or without food. If you have difficulty swallowing the tablet whole please discuss with your pharmacist how to take the medication safely.  Take Eliquis  exactly as prescribed by your doctor and DO NOT stop taking Eliquis  without talking to the doctor who prescribed the medication.  Stopping may increase your risk of developing a stroke.  Refill your prescription before you run out.  After discharge, you should have regular check-up appointments with your healthcare provider that is prescribing your Eliquis .  In the future your dose may need to be changed if your kidney function or weight changes by a significant amount or as you get older.  What do you do if you miss a dose? If you miss a dose, take it as soon as you remember on the same day and resume taking twice daily.   Do not take more than one dose of ELIQUIS  at the same time to make up a missed dose.  Important Safety Information A possible side effect of Eliquis  is bleeding. You should call your healthcare provider right away if you experience any of the following: Bleeding from an injury or your nose that does not stop. Unusual colored urine (red or dark brown) or unusual colored stools (red or black). Unusual bruising for unknown reasons. A serious fall or if you hit your head (even if there is no bleeding).  Some medicines may interact with Eliquis  and might increase your risk of bleeding or clotting while on Eliquis . To help avoid this, consult your healthcare provider or pharmacist prior to using any new prescription or non-prescription medications, including herbals, vitamins, non-steroidal anti-inflammatory drugs (NSAIDs) and supplements.  This website has more information on Eliquis  (apixaban ): http://[email or web address] .com/eliquis dena

## 2023-12-29 NOTE — Transfer of Care (Signed)
 Immediate Anesthesia Transfer of Care Note  Patient: Hunter [name]  Procedure(s) Performed: XI ROBOTIC ASSISTED PARASTOMAL HERNIA REPAIR WITH MESH, INCISIONAL AND ILEAL CONDUIT, LYSIS OF ADHESIONS, BILATERAL TAP BLOCK  Patient Location: PACU  Anesthesia Type:General  Level of Consciousness: sedated  Airway & Oxygen Therapy: Patient Spontanous Breathing and Patient connected to face mask oxygen  Post-op Assessment: Report given to RN and Post -op Vital signs reviewed and stable  Post vital signs: Reviewed and stable  Last Vitals:  Vitals Value Taken Time  BP    Temp    Pulse    Resp    SpO2      Last Pain:  Vitals:   12/29/23 0614  TempSrc: Oral         Complications: No notable events documented.

## 2023-12-30 ENCOUNTER — Encounter (HOSPITAL_COMMUNITY): Payer: Self-pay | Admitting: Surgery

## 2023-12-30 DIAGNOSIS — N1832 Chronic kidney disease, stage 3b: Secondary | ICD-10-CM | POA: Diagnosis present

## 2023-12-30 DIAGNOSIS — R29898 Other symptoms and signs involving the musculoskeletal system: Secondary | ICD-10-CM | POA: Diagnosis present

## 2023-12-30 DIAGNOSIS — Z8612 Personal history of poliomyelitis: Secondary | ICD-10-CM | POA: Diagnosis present

## 2023-12-30 LAB — POTASSIUM: Potassium: 4.7 mmol/L (ref 3.5–5.1)

## 2023-12-30 LAB — CBC
HCT: 36.3 % — ABNORMAL LOW (ref 39.0–52.0)
Hemoglobin: 11.7 g/dL — ABNORMAL LOW (ref 13.0–17.0)
MCH: 31.5 pg (ref 26.0–34.0)
MCHC: 32.2 g/dL (ref 30.0–36.0)
MCV: 97.8 fL (ref 80.0–100.0)
Platelets: 168 10*3/uL (ref 150–400)
RBC: 3.71 MIL/uL — ABNORMAL LOW (ref 4.22–5.81)
RDW: 14.6 % (ref 11.5–15.5)
WBC: 11.6 10*3/uL — ABNORMAL HIGH (ref 4.0–10.5)
nRBC: 0 % (ref 0.0–0.2)

## 2023-12-30 LAB — CREATININE, SERUM
Creatinine, Ser: 1.94 mg/dL — ABNORMAL HIGH (ref 0.61–1.24)
GFR, Estimated: 35 mL/min — ABNORMAL LOW (ref >60)

## 2023-12-30 MED ORDER — KCL IN DEXTROSE-NACL 40-5-0.45 MEQ/L-%-% IV SOLN
INTRAVENOUS | Status: DC
  Filled 2023-12-30 (×2): qty 1000

## 2023-12-30 MED ORDER — HYDROCODONE-ACETAMINOPHEN 5-325 MG PO TABS: 1.0000 | ORAL_TABLET | ORAL | 0 refills | Status: DC | PRN

## 2023-12-30 MED ORDER — SODIUM CHLORIDE 0.9% FLUSH
10.0000 mL | Freq: Two times a day (BID) | INTRAVENOUS | Status: DC
  Administered 2023-12-30 – 2024-01-03 (×7): 10 mL
  Administered 2024-01-04: 40 mL
  Administered 2024-01-04 – 2024-01-06 (×4): 10 mL

## 2023-12-30 MED ORDER — METOPROLOL TARTRATE 50 MG PO TABS: 50.0000 mg | ORAL_TABLET | Freq: Two times a day (BID) | ORAL | Status: DC

## 2023-12-30 MED ORDER — LACTATED RINGERS IV BOLUS
1000.0000 mL | Freq: Once | INTRAVENOUS | Status: AC
  Administered 2023-12-30: 1000 mL via INTRAVENOUS

## 2023-12-30 MED ORDER — LACTATED RINGERS IV BOLUS: 1000.0000 mL | Freq: Three times a day (TID) | INTRAVENOUS | Status: AC | PRN

## 2023-12-30 MED ORDER — CHLORHEXIDINE GLUCONATE CLOTH 2 % EX PADS
6.0000 | MEDICATED_PAD | Freq: Every day | CUTANEOUS | Status: DC
  Administered 2023-12-30 – 2024-01-08 (×9): 6 via TOPICAL

## 2023-12-30 NOTE — Evaluation (Signed)
 Physical Therapy Evaluation Patient Details Name: Hunter [name] MRN: [id number] DOB: 10-09-1946 Today's Date: 12/30/2023  History of Present Illness  79 yo male presents to therapy following hospital admission on 12/28/2022 for abdominal hernia repair. 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  Clinical Impression   Pt admitted with above diagnosis.  Pt currently with functional limitations due to the deficits listed below (see PT Problem List). Pt in bed when PT arrived. Spouse present. Pt agreeable to therapy intervention. Pt required mod A and cues for log roll technique for supine to sit with use of hospital bed, pt required CGA for pull to stand from EOB to RW, gait tasks with RW, CGA and min cues, pt elected to return to bed and required min A and cues for sit to supine. Pt left in bed all needs in place and spouse present. Pt will benefit from acute skilled PT to increase their independence and safety with mobility to allow discharge.         If plan is discharge home, recommend the following: A little help with walking and/or transfers;A little help with bathing/dressing/bathroom;Assistance with cooking/housework;Help with stairs or ramp for entrance;Assist for transportation   Can travel by private vehicle        Equipment Recommendations None recommended by PT  Recommendations for Other Services       Functional Status Assessment Patient has had a recent decline in their functional status and demonstrates the ability to make significant improvements in function in a reasonable and predictable amount of time.     Precautions / Restrictions Precautions Precautions:  (abdominal) Required Braces or Orthoses:  (abdominal binder) Restrictions Weight Bearing Restrictions Per Provider Order: No      Mobility  Bed Mobility Overal bed  mobility: Needs Assistance Bed Mobility: Supine to Sit, Sit to Supine     Supine to sit: Mod assist, HOB elevated Sit to supine: Min assist   General bed mobility comments: min cues for log roll for abdominal sabilization with abdominal binder donned and increased time    Transfers Overall transfer level: Needs assistance Equipment used: Rolling walker (2 wheels) Transfers: Sit to/from Stand Sit to Stand: Contact guard assist           General transfer comment: pull to stand from EOB to RW with min cues    Ambulation/Gait Ambulation/Gait assistance: Contact guard assist Gait Distance (Feet): 300 Feet Assistive device: Rolling walker (2 wheels) Gait Pattern/deviations: Step-through pattern, Trunk flexed Gait velocity: decreased     General Gait Details: min cues for safety and posture  Stairs            Wheelchair Mobility     Tilt Bed    Modified Rankin (Stroke Patients Only)       Balance Overall balance assessment: Mild deficits observed, not formally tested                                           Pertinent Vitals/Pain Pain Assessment Pain Assessment: 0-10 Pain Score: 4  Pain Location: abdominal Pain Descriptors / Indicators: Constant, Discomfort, Dull, Grimacing, Guarding Pain Intervention(s): Limited activity within patient's tolerance, Monitored during session, Premedicated before session, Repositioned    Home Living Family/patient expects to  be discharged to:: Private residence Living Arrangements: Spouse/significant other;Children Available Help at Discharge: Family;Friend(s);Available 24 hours/day Type of Home: House Home Access: Stairs to enter Entrance Stairs-Rails: None Entrance Stairs-Number of Steps: 1 Alternate Level Stairs-Number of Steps: 7 Home Layout: Multi-level Home Equipment: Agricultural Consultant (2 wheels);Shower seat - built in;Grab bars - tub/shower Additional Comments: patient can stay on den level which  is no STE with full bath on same level    Prior Function Prior Level of Function : Independent/Modified Independent;Driving             Mobility Comments: IND no AD for all ADLs, self care tasks and IADLs. ADLs Comments: prior to cervical surgery 03/2023 pt was avid golfer, following surgery R UE MMT and ROM deficits     Extremity/Trunk Assessment   Upper Extremity Assessment Upper Extremity Assessment: Defer to OT evaluation    Lower Extremity Assessment Lower Extremity Assessment: Overall WFL for tasks assessed    Cervical / Trunk Assessment Cervical / Trunk Assessment: Neck Surgery  Communication   Communication Communication: No apparent difficulties  Cognition Arousal: Alert Behavior During Therapy: WFL for tasks assessed/performed Overall Cognitive Status: Within Functional Limits for tasks assessed                                          General Comments General comments (skin integrity, edema, etc.): R UE edema and a few blisters noted, nursing aware    Exercises     Assessment/Plan    PT Assessment Patient needs continued PT services  PT Problem List Decreased activity tolerance;Decreased balance;Decreased mobility;Pain       PT Treatment Interventions DME instruction;Gait training;Stair training;Functional mobility training;Therapeutic activities;Therapeutic exercise;Balance training;Neuromuscular re-education;Patient/family education    PT Goals (Current goals can be found in the Care Plan section)  Acute Rehab PT Goals Patient Stated Goal: to be able to move my R UE, decrease pain and get back out on the golf course PT Goal Formulation: With patient Time For Goal Achievement: 01/13/24 Potential to Achieve Goals: Good    Frequency Min 1X/week     Co-evaluation               AM-PAC PT 6 Clicks Mobility  Outcome Measure Help needed turning from your back to your side while in a flat bed without using bedrails?: A  Little Help needed moving from lying on your back to sitting on the side of a flat bed without using bedrails?: A Lot Help needed moving to and from a bed to a chair (including a wheelchair)?: A Little Help needed standing up from a chair using your arms (e.g., wheelchair or bedside chair)?: A Little Help needed to walk in hospital room?: A Little Help needed climbing 3-5 steps with a railing? : A Lot 6 Click Score: 16    End of Session Equipment Utilized During Treatment:  (abdominal binder) Activity Tolerance: Patient limited by fatigue Patient left: in bed;with call bell/phone within reach;with family/visitor present Nurse Communication: Mobility status PT Visit Diagnosis: Other abnormalities of gait and mobility (R26.89);Difficulty in walking, not elsewhere classified (R26.2);Pain Pain - part of body:  (abdomen)    Time: 8886-8854 PT Time Calculation (min) (ACUTE ONLY): 32 min   Charges:   PT Evaluation $PT Eval Low Complexity: 1 Low PT Treatments $Gait Training: 8-22 mins PT General Charges $$ ACUTE PT VISIT: 1 Visit  [place], PT Acute Rehab   [place] VEAR Drone 12/30/2023, 2:14 PM

## 2023-12-30 NOTE — Progress Notes (Signed)
 12/30/2023  Hunter [name] [id number] 1946/07/09  CARE TEAM: PCP: Paola Greig CROME, FNP  Outpatient Care Team: Patient Care Team: Paola Greig CROME, FNP as PCP - General (Family Medicine) Croitoru, Jerel, MD as PCP - Cardiology (Cardiology) Croitoru, Jerel, MD as Attending Physician (Cardiology) Mayo, Darice LABOR, FNP as WOC Nurse (Nurse Practitioner) Rogers Hai, MD as Medical Oncologist (Medical Oncology) Alvaro Ricardo KATHEE Raddle., MD as Consulting Physician (Urology) Colon Shove, MD as Consulting Physician (Neurosurgery) Sheldon Standing, MD as Consulting Physician (General Surgery)  Inpatient Treatment Team: Treatment Team:  Sheldon Standing, MD Feliberto Graces, RN Osie Iantha SQUIBB, [facility] Delores Ronal MATSU, RN Marcey Duwaine RAMAN, LCSW   Problem List:   Principal Problem:   Incarcerated incisional hernia Active Problems:   Hyperlipidemia   GOUT   MONONEURITIS OF UNSPECIFIED SITE   Essential hypertension   GERD   CAD s/p CABG   S/P CABG x 4, 12/12/12, LIMA-LAD;VG-OM1,OM2;VG-PD   Port-A-Cath in place   Parastomal hernia of ileal conduit   History of poliomyelitis   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  5 x 4 cm region.  Primarily closed.   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  13 x 5 cm region.  Right sided component separation TAR release done to allow largest  8x5cm defect to be primarily closed   Type of ventral wall repair:  Laparoscopic underlay repair .with Primary repair of largest hernia Placement of mesh: Retrorectus / Preperitoneal underlay repair Name of mesh: Bard Ventralight dual sided (polypropylene / Seprafilm) Size of mesh: 33x27cm Orientation:  Mostly vertical with slightly oblique to right lower quadrant Mesh overlap:  5-7cm   Assessment [facility] Stay = 1 days) 1 Day Post-Op    Doing okay so far    Plan:  Hypokalemia.  Mild.  Replace and check magnesium   Elevated creatinine.  A little above his chronic kidney disease after his cystoprostatectomy and ureteral ileal conduit.  He does not appear to be nonoliguric but will give IV fluids and follow labs.  Should transiently resolve  Antibiotics x 23 hours  Tolerating liquids.  Gradually advance diet.  History of polio with chronic left upper extremity weakness.  Right upper extremity weakness postop seems to be improving already.  Have therapies to be involved and follow.  He gives me no other signs of neurological decline so I do not think he is having a stroke or other issue.  Follow closely.  Should improve.  History of gout.  Allopurinol   History of allergies.  Continue Flonase   History of hypertension.  He was trying to explain that he usually has a lower dose on Fridays.  Will adjust dosing for now with breakthrough if needed.  -VTE prophylaxis- SCDs, etc  -mobilize as tolerated to help recovery   -Disposition:  Disposition:  The patient is from: Home Anticipate discharge to:  Home with Home Health Anticipated Date of Discharge is:  January 5,2025   Barriers  to discharge:  Consultant clearance & sign off  , Therapy assessment & Recommendations pending, and Pending Clinical improvement (more likely than not)  Patient currently is NOT MEDICALLY STABLE for discharge from the hospital from a surgery standpoint.      I reviewed nursing notes, last 24 h  vitals and pain scores, last 48 h intake and output, last 24 h labs and trends, and last 24 h imaging results.  I have reviewed this patient's available data, including medical history, events of note, test results, etc as part of my evaluation.   A significant portion of that time was spent in counseling. Care during the described time interval was provided by me.  This care required moderate level of medical decision making.  12/30/2023    Subjective: (Chief complaint)  Patient notes he is very sore but not much pain.  Not nauseated tolerating liquids.  Worried that his right upper extremity feels weak and he cannot raise his arm as much as he used to.  Left upper extremity weakness stable from his prior polio.  Nursing and wife in room.  Objective:  Vital signs:  Vitals:   12/29/23 1720 12/29/23 2115 12/30/23 0145 12/30/23 0527  BP: 106/73 110/75 95/70 98/67   Pulse: 95 87 86 76  Resp: 18 18 18 18   Temp: (!) 97.5 F (36.4 C) (!) 97.1 F (36.2 C) 97.8 F (36.6 C) 97.8 F (36.6 C)  TempSrc:      SpO2: 100% 100% 100% 96%  Weight:      Height:        Last BM Date : 12/28/23  Intake/Output   Yesterday:  01/02 0701 - 01/03 0700 In: 3039.2 [P.O.:60; I.V.:2579.2; IV Piggyback:400] Out: 400 [Urine:350; Blood:50] This shift:  No intake/output data recorded.  Bowel function:  Flatus: YES  BM:  No  Drain: (No drain)   Physical Exam:  General: Pt awake/alert in no acute distress Eyes: PERRL, normal EOM.  Sclera clear.  No icterus Neuro: CN II-XII intact w/o focal sensory/motor deficits.  Chronic left upper extremity weakness this is baseline.  He can elevate his right upper extremity up to his shoulder but not above his head.  No problem with passive motion though.  Good handgrip. Lymph: No head/neck/groin lymphadenopathy Psych:  No delerium/psychosis/paranoia.  Oriented x 4 HENT: Normocephalic, Mucus membranes moist.  No thrush Neck: Supple, No tracheal  deviation.  No obvious thyromegaly Chest: No pain to chest wall compression.  Good respiratory excursion.  No audible wheezing CV:  Pulses intact.  Regular rhythm.  No major extremity edema MS: Normal AROM mjr joints.  No obvious deformity  Abdomen: Soft.  Nondistended.  Mildly tender at incisions only.  No evidence of peritonitis.  No incarcerated hernias. Right lower quadrant ileal conduit urostomy pink with clear light yellow urine in bag  Ext:   No deformity.  No mjr edema.  No cyanosis Skin: No petechiae / purpurea.  No major sores.  Warm and dry    Results:   Cultures: No results found for this or any previous visit (from the past 720 hours).  Labs: Results for orders placed or performed during the hospital encounter of 12/29/23 (from the past 48 hours)  CBC     Status: Abnormal   Collection Time: 12/30/23  5:57 AM  Result Value Ref Range   WBC 11.6 (H) 4.0 - 10.5 K/uL   RBC 3.71 (L) 4.22 - 5.81 MIL/uL   Hemoglobin 11.7 (L) 13.0 - 17.0 g/dL  HCT 36.3 (L) 39.0 - 52.0 %   MCV 97.8 80.0 - 100.0 fL   MCH 31.5 26.0 - 34.0 pg   MCHC 32.2 30.0 - 36.0 g/dL   RDW 85.3 88.4 - 84.4 %   Platelets 168 150 - 400 K/uL   nRBC 0.0 0.0 - 0.2 %    Comment: Performed at [facility], [address] [place], [place] [address]  Creatinine, serum     Status: Abnormal   Collection Time: 12/30/23  5:57 AM  Result Value Ref Range   Creatinine, Ser 1.94 (H) 0.61 - 1.24 mg/dL   GFR, Estimated 35 (L) >60 mL/min    Comment: (NOTE) Calculated using the CKD-EPI Creatinine Equation (2021) Performed at [facility], [address] [place], [place] [address]   Potassium     Status: None   Collection Time: 12/30/23  5:57 AM  Result Value Ref Range   Potassium 4.7 3.5 - 5.1 mmol/L    Comment: Performed at [facility], [address] [place], [place] [address]    Imaging / Studies: No results found.  Medications / Allergies: per  chart  Antibiotics: Anti-infectives (From admission, onward)    Start     Dose/Rate Route Frequency Ordered Stop   12/29/23 1600  ceFAZolin  (ANCEF ) IVPB 2g/100 mL premix        2 g 200 mL/hr over 30 Minutes Intravenous Every 8 hours 12/29/23 1401 12/30/23 0128   12/29/23 1600  metroNIDAZOLE  (FLAGYL ) IVPB 500 mg        500 mg 100 mL/hr over 60 Minutes Intravenous Every 8 hours 12/29/23 1401 12/30/23 1559   12/29/23 0600  ceFAZolin  (ANCEF ) IVPB 2g/100 mL premix       Placed in And Linked Group   2 g 200 mL/hr over 30 Minutes Intravenous On call to O.R. 12/29/23 9470 12/29/23 0734   12/29/23 0600  metroNIDAZOLE  (FLAGYL ) IVPB 500 mg       Placed in And Linked Group   500 mg 100 mL/hr over 60 Minutes Intravenous On call to O.R. 12/29/23 0529 12/29/23 0746         Note: Portions of this report may have been transcribed using voice recognition software. Every effort was made to ensure accuracy; however, inadvertent computerized transcription errors may be present.   Any transcriptional errors that result from this process are unintentional.    Elspeth KYM Schultze, MD, FACS, MASCRS Esophageal, Gastrointestinal & Colorectal Surgery Robotic and Minimally Invasive Surgery  [facility] [address], [address] [place], [place] [address] [phone] Fax [phone] Main  CONTACT INFORMATION: Weekday (9AM-5PM): Call [facility] main office at [phone] Weeknight (5PM-9AM) or Weekend/Holiday: Check EPIC Web Links tab & use AMION (password  [email or web address]) for General Surgery [facility] coverage  Please, DO NOT use SecureChat  (it is not reliable communication to reach operating surgeons & will lead to a delay in care).   Epic staff messaging available for outptient concerns needing 1-2 business day response.      12/30/2023  8:18 AM

## 2023-12-31 LAB — CBC
HCT: 32.1 % — ABNORMAL LOW (ref 39.0–52.0)
Hemoglobin: 10.6 g/dL — ABNORMAL LOW (ref 13.0–17.0)
MCH: 32.1 pg (ref 26.0–34.0)
MCHC: 33 g/dL (ref 30.0–36.0)
MCV: 97.3 fL (ref 80.0–100.0)
Platelets: 153 10*3/uL (ref 150–400)
RBC: 3.3 MIL/uL — ABNORMAL LOW (ref 4.22–5.81)
RDW: 14.5 % (ref 11.5–15.5)
WBC: 11.4 10*3/uL — ABNORMAL HIGH (ref 4.0–10.5)
nRBC: 0 % (ref 0.0–0.2)

## 2023-12-31 LAB — POTASSIUM: Potassium: 5.1 mmol/L (ref 3.5–5.1)

## 2023-12-31 LAB — CREATININE, SERUM
Creatinine, Ser: 2.02 mg/dL — ABNORMAL HIGH (ref 0.61–1.24)
GFR, Estimated: 33 mL/min — ABNORMAL LOW (ref >60)

## 2023-12-31 MED ORDER — SODIUM CHLORIDE 0.9% FLUSH
3.0000 mL | Freq: Two times a day (BID) | INTRAVENOUS | Status: DC
  Administered 2024-01-01 – 2024-01-08 (×14): 3 mL via INTRAVENOUS

## 2023-12-31 MED ORDER — CALCIUM POLYCARBOPHIL 625 MG PO TABS
625.0000 mg | ORAL_TABLET | Freq: Two times a day (BID) | ORAL | Status: DC
  Administered 2023-12-31 (×2): 625 mg via ORAL
  Filled 2023-12-31 (×2): qty 1

## 2023-12-31 MED ORDER — METOPROLOL TARTRATE 25 MG PO TABS
25.0000 mg | ORAL_TABLET | Freq: Two times a day (BID) | ORAL | Status: DC
  Filled 2023-12-31 (×2): qty 1

## 2023-12-31 MED ORDER — GABAPENTIN 300 MG PO CAPS
300.0000 mg | ORAL_CAPSULE | Freq: Three times a day (TID) | ORAL | Status: DC
  Administered 2023-12-31 (×2): 300 mg via ORAL
  Filled 2023-12-31 (×3): qty 3

## 2023-12-31 MED ORDER — SODIUM CHLORIDE 0.9% FLUSH: 3.0000 mL | INTRAVENOUS | Status: DC | PRN

## 2023-12-31 MED ORDER — POLYETHYLENE GLYCOL 3350 17 G PO PACK
34.0000 g | PACK | Freq: Once | ORAL | Status: AC
  Administered 2023-12-31: 34 g via ORAL
  Filled 2023-12-31: qty 2

## 2023-12-31 MED ORDER — SODIUM CHLORIDE 0.9 % IV SOLN: 250.0000 mL | INTRAVENOUS | Status: DC | PRN

## 2023-12-31 NOTE — Progress Notes (Signed)
 Physical Therapy Treatment Patient Details Name: Hunter [name] MRN: [id number] DOB: 1946/03/05 Today's Date: 12/31/2023   History of Present Illness 78 yo male presents to therapy following hospital admission on 12/28/2022 for abdominal hernia repair. 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    PT Comments  Pt didn't feel well today with projectile vomiting earlier in the day. Initially felt he could walk and then after starting with mobility didn't feel he could get up.  Encouraged him and he did agree to get up to the recliner.  Needed more physical assistance today.   If plan is discharge home, recommend the following: A little help with walking and/or transfers;A little help with bathing/dressing/bathroom;Assistance with cooking/housework;Help with stairs or ramp for entrance;Assist for transportation   Can travel by private vehicle        Equipment Recommendations  None recommended by PT    Recommendations for Other Services       Precautions / Restrictions Precautions Precautions: Other (comment) Precaution Comments: L U weakness Restrictions Weight Bearing Restrictions Per Provider Order: No     Mobility  Bed Mobility Overal bed mobility: Needs Assistance Bed Mobility: Rolling, Sidelying to Sit Rolling: Min assist Sidelying to sit: Mod assist       General bed mobility comments: cues for safe log rolling techique    Transfers Overall transfer level: Needs assistance Equipment used: Rolling walker (2 wheels) Transfers: Sit to/from Stand Sit to Stand: Mod assist   Step pivot transfers: Contact guard assist       General transfer comment: pull to stand from EOB to RW with min cues    Ambulation/Gait                   Stairs             Wheelchair Mobility     Tilt Bed    Modified Rankin  (Stroke Patients Only)       Balance                                            Cognition Arousal: Alert Behavior During Therapy: WFL for tasks assessed/performed Overall Cognitive Status: Within Functional Limits for tasks assessed                                          Exercises      General Comments        Pertinent Vitals/Pain Pain Assessment Pain Assessment: Faces Faces Pain Scale: Hurts even more Pain Location: abdomen with bed mobility Pain Descriptors / Indicators: Grimacing Pain Intervention(s): Repositioned, Limited activity within patient's tolerance, Monitored during session    Home Living                          Prior Function            PT Goals (current goals can now be found in the care plan section) Acute Rehab PT Goals PT Goal Formulation: With patient Time For Goal Achievement: 01/13/24 Potential to Achieve Goals: Good Progress towards PT goals: Not progressing toward goals -  comment (didn't feel well today)    Frequency    Min 1X/week      PT Plan      Co-evaluation              AM-PAC PT 6 Clicks Mobility   Outcome Measure  Help needed turning from your back to your side while in a flat bed without using bedrails?: A Little Help needed moving from lying on your back to sitting on the side of a flat bed without using bedrails?: A Lot Help needed moving to and from a bed to a chair (including a wheelchair)?: A Little Help needed standing up from a chair using your arms (e.g., wheelchair or bedside chair)?: A Little Help needed to walk in hospital room?: A Little Help needed climbing 3-5 steps with a railing? : A Lot 6 Click Score: 16    End of Session   Activity Tolerance: Patient limited by fatigue Patient left: in chair;with call bell/phone within reach;with family/visitor present Nurse Communication: Mobility status PT Visit Diagnosis: Other abnormalities of gait and  mobility (R26.89);Difficulty in walking, not elsewhere classified (R26.2);Pain     Time: 8667-8648 PT Time Calculation (min) (ACUTE ONLY): 19 min  Charges:    $Therapeutic Activity: 8-22 mins PT General Charges $$ ACUTE PT VISIT: 1 Visit                     Darice RAMAN., PT Office [phone] Acute Rehab 12/31/2023    Darice LITTIE Sharps 12/31/2023, 2:33 PM

## 2023-12-31 NOTE — Progress Notes (Signed)
 12/31/2023  Hunter [name] [id number] May 04, 1946  CARE TEAM: PCP: Hunter Greig CROME, FNP  Outpatient Care Team: Patient Care Team: Hunter Greig CROME, FNP as PCP - General (Family Medicine) [name], Jerel, MD as PCP - Cardiology (Cardiology) [name], Jerel, MD as Attending Physician (Cardiology) [name], Hunter LABOR, FNP as WOC Nurse (Nurse Practitioner) Hunter Hai, MD as Medical Oncologist (Medical Oncology) Hunter Ricardo KATHEE Raddle., MD as Consulting Physician (Urology) Hunter Shove, MD as Consulting Physician (Neurosurgery) Hunter Standing, MD as Consulting Physician (General Surgery)  Inpatient Treatment Team: Treatment Team:  Hunter Standing, MD Hunter [name], OT Hunter, Sarala, RN Hunter [name], NT Hunter [name], PT Hunter Knee, RN   Problem List:   Principal Problem:   Incarcerated incisional hernia Active Problems:   Hyperlipidemia   GOUT   MONONEURITIS OF UNSPECIFIED SITE   Essential hypertension   GERD   CAD s/p CABG   S/P CABG x 4, 12/12/12, LIMA-LAD;VG-OM1,OM2;VG-PD   Port-A-Cath in place   Parastomal hernia of ileal conduit   History of poliomyelitis   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  5 x 4 cm region.  Primarily closed.   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  13 x 5 cm region.  Right sided component separation TAR  release done to allow largest 8x5cm defect to be primarily closed   Type of ventral wall repair:  Laparoscopic underlay repair .with Primary repair of largest hernia Placement of mesh: Retrorectus / Preperitoneal underlay repair Name of mesh: Bard Ventralight dual sided (polypropylene / Seprafilm) Size of mesh: 33x27cm Orientation:  Mostly vertical with slightly oblique to right lower quadrant Mesh overlap:  5-7cm   Assessment Hunter [name] Stay = 2 days) 2 Days Post-Op    Doing okay so far    Plan:  Somewhat a challenge to read.  Given some mild nausea we will keep on soft diet for now.  Try fiber bowel regimen with MiraLAX  x 1 since he thinks he has had a little bit of flatus  Felt better with abdominal binder off.  Continue low-dose Tylenol  and stronger stuff for breakthrough.  Mobilizing get out of bed.  Hypokalemia.  Corrected.  Hold off on replacement for now.  Elevated creatinine.  A little above his chronic kidney disease after his cystoprostatectomy and ureteral ileal conduit.  However he is nonoliguric.  Will Medlock IV fluids with low threshold to give PRN (as needed) boluses.  May give trial of Lasix  tomorrow but try and hold off today.    If still having issues, may ask for hospitalist consultation to make sure were not missing anything.    Antibiotics x 23 hours  Tolerating liquids.  Gradually advance diet.  History of polio with chronic left upper extremity weakness.  Right upper extremity weakness postop seems to be improving already.  Have therapies to be involved and follow.  He gives me no other signs of neurological decline so I do not think  he is having a stroke or other issue.  Follow closely.  Should improve.  History of gout.  Allopurinol   History of allergies.  Continue Flonase   History of hypertension.  Usually just on metoprolol .  He was trying to explain that he usually has a lower dose on Fridays.  Will back off from 50 mg twice daily to 25 mg twice  daily for now with breakthrough if needed.  -VTE prophylaxis- SCDs, etc  -mobilize as tolerated to help recovery   -Disposition:  Disposition:  The patient is from: Home Anticipate discharge to:  Home with Home Health Anticipated Date of Discharge is:  January 6,2025   Barriers to discharge:  Consultant clearance & sign off  , Therapy assessment & Recommendations pending, and Pending Clinical improvement (more likely than not)  Patient currently is NOT MEDICALLY STABLE for discharge from the hospital from a surgery standpoint.      I reviewed nursing notes, last 24 h vitals and pain scores, last 48 h intake and output, last 24 h labs and trends, and last 24 h imaging results.  I have reviewed this patient's available data, including medical history, events of note, test results, etc as part of my evaluation.   A significant portion of that time was spent in counseling. Care during the described time interval was provided by me.  This care required moderate level of medical decision making.  12/31/2023    Subjective: (Chief complaint)  Wife in room.  Still with some soreness when he gets up but was able to get up in chair.  Arm swelling has gone down and right arm function is improved.  Was tolerating some soft but felt a little nauseated with belching.  Feeling better this morning.  Like to get the abdominal binder off.  Objective:  Vital signs:  Vitals:   12/30/23 0951 12/30/23 1351 12/30/23 2102 12/31/23 0646  BP: 100/72 94/64 113/73 127/84  Pulse: 70 71 77 97  Resp: 16 18 18 18   Temp: (!) 97.4 F (36.3 C) 97.6 F (36.4 C) 98.6 F (37 C) 98.6 F (37 C)  TempSrc: Oral Oral Oral Oral  SpO2: 97% 100% 98% 100%  Weight:      Height:        Last BM Date : 12/28/23  Intake/Output   Yesterday:  01/03 0701 - 01/04 0700 In: 2737.9 [P.O.:1200; I.V.:1537.9] Out: 3125 [Urine:3125] This shift:  No intake/output data recorded.  Bowel function:  Flatus:  YES  BM:  No  Drain: (No drain)   Physical Exam:  General: Pt awake/alert in no acute distress Eyes: PERRL, normal EOM.  Sclera clear.  No icterus Neuro: CN II-XII intact w/o focal sensory/motor deficits.  Chronic left upper extremity weakness this is baseline.  He can elevate his right upper extremity up to his shoulder but not above his head.  No problem with passive motion though.  Good handgrip. Lymph: No head/neck/groin lymphadenopathy Psych:  No delerium/psychosis/paranoia.  Oriented x 4 HENT: Normocephalic, Mucus membranes moist.  No thrush Neck: Supple, No tracheal deviation.  No obvious thyromegaly Chest: No pain to chest wall compression.  Good respiratory excursion.  No audible wheezing CV:  Pulses intact.  Regular rhythm.  No major extremity edema MS: Normal AROM mjr joints.  No obvious deformity  Abdomen: Soft.  Mildy distended.  Remove the abdominal binder.  He tolerated well.  Mildly tender at incisions only.  No evidence of peritonitis.  No incarcerated hernias. Right lower quadrant ileal conduit urostomy  pink with clear light yellow urine in bag  Ext:   No deformity.  No mjr edema.  No cyanosis Skin: No petechiae / purpurea.  No major sores.  Warm and dry    Results:   Cultures: No results found for this or any previous visit (from the past 720 hours).  Labs: Results for orders placed or performed during the hospital encounter of 12/29/23 (from the past 48 hours)  CBC     Status: Abnormal   Collection Time: 12/30/23  5:57 AM  Result Value Ref Range   WBC 11.6 (H) 4.0 - 10.5 K/uL   RBC 3.71 (L) 4.22 - 5.81 MIL/uL   Hemoglobin 11.7 (L) 13.0 - 17.0 g/dL   HCT 63.6 (L) 60.9 - 47.9 %   MCV 97.8 80.0 - 100.0 fL   MCH 31.5 26.0 - 34.0 pg   MCHC 32.2 30.0 - 36.0 g/dL   RDW 85.3 88.4 - 84.4 %   Platelets 168 150 - 400 K/uL   nRBC 0.0 0.0 - 0.2 %    Comment: Performed at [facility], [address] [place], [place] [address]  Creatinine,  serum     Status: Abnormal   Collection Time: 12/30/23  5:57 AM  Result Value Ref Range   Creatinine, Ser 1.94 (H) 0.61 - 1.24 mg/dL   GFR, Estimated 35 (L) >60 mL/min    Comment: (NOTE) Calculated using the CKD-EPI Creatinine Equation (2021) Performed at [facility], [address], [place], [place] [address]   Potassium     Status: None   Collection Time: 12/30/23  5:57 AM  Result Value Ref Range   Potassium 4.7 3.5 - 5.1 mmol/L    Comment: Performed at [facility], [address], [place], [place] [address]  Potassium     Status: None   Collection Time: 12/31/23  4:25 AM  Result Value Ref Range   Potassium 5.1 3.5 - 5.1 mmol/L    Comment: Performed at [facility], [address] [place], [place] [address]  Creatinine, serum     Status: Abnormal   Collection Time: 12/31/23  4:25 AM  Result Value Ref Range   Creatinine, Ser 2.02 (H) 0.61 - 1.24 mg/dL   GFR, Estimated 33 (L) >60 mL/min    Comment: (NOTE) Calculated using the CKD-EPI Creatinine Equation (2021) Performed at [facility], [address] [place], [place] [address]   CBC     Status: Abnormal   Collection Time: 12/31/23  4:25 AM  Result Value Ref Range   WBC 11.4 (H) 4.0 - 10.5 K/uL   RBC 3.30 (L) 4.22 - 5.81 MIL/uL   Hemoglobin 10.6 (L) 13.0 - 17.0 g/dL   HCT 67.8 (L) 60.9 - 47.9 %   MCV 97.3 80.0 - 100.0 fL   MCH 32.1 26.0 - 34.0 pg   MCHC 33.0 30.0 - 36.0 g/dL   RDW 85.4 88.4 - 84.4 %   Platelets 153 150 - 400 K/uL   nRBC 0.0 0.0 - 0.2 %    Comment: Performed at [facility], [address], [place], [place] [address]    Imaging / Studies: No results found.  Medications / Allergies: per chart  Antibiotics: Anti-infectives (From admission, onward)    Start     Dose/Rate Route Frequency Ordered Stop   12/29/23 1600  ceFAZolin  (ANCEF ) IVPB 2g/100 mL premix        2 g 200 mL/hr over  30 Minutes  Intravenous Every 8 hours 12/29/23 1401 12/30/23 0128   12/29/23 1600  metroNIDAZOLE  (FLAGYL ) IVPB 500 mg        500 mg 100 mL/hr over 60 Minutes Intravenous Every 8 hours 12/29/23 1401 12/30/23 0844   12/29/23 0600  ceFAZolin  (ANCEF ) IVPB 2g/100 mL premix       Placed in And Linked Group   2 g 200 mL/hr over 30 Minutes Intravenous On call to O.R. 12/29/23 0529 12/29/23 0734   12/29/23 0600  metroNIDAZOLE  (FLAGYL ) IVPB 500 mg       Placed in And Linked Group   500 mg 100 mL/hr over 60 Minutes Intravenous On call to O.R. 12/29/23 0529 12/29/23 0746         Note: Portions of this report may have been transcribed using voice recognition software. Every effort was made to ensure accuracy; however, inadvertent computerized transcription errors may be present.   Any transcriptional errors that result from this process are unintentional.    Elspeth KYM Schultze, MD, FACS, MASCRS Esophageal, Gastrointestinal & Colorectal Surgery Robotic and Minimally Invasive Surgery  [facility] [address] [place], [place] [address] [phone] Fax [phone] Main  CONTACT INFORMATION: Weekday (9AM-5PM): Call [facility] main office at [phone] Weeknight (5PM-9AM) or Weekend/Holiday: Check EPIC Web Links tab & use AMION (password  [email or web address]) for General Surgery [facility] coverage  Please, DO NOT use SecureChat  (it is not reliable communication to reach operating surgeons & will lead to a delay in care).   Epic staff messaging available for outptient concerns needing 1-2 business day response.      12/31/2023  9:28 AM

## 2023-12-31 NOTE — Evaluation (Signed)
 Occupational Therapy Evaluation Patient Details Name: Hunter [name] MRN: [id number] DOB: 1946/06/14 Today's Date: 12/31/2023   History of Present Illness 78 yo male presents to therapy following hospital admission on 12/28/2022 for abdominal hernia repair. 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   Clinical Impression   PTA, pt was mod I at home for ADL and IADL living with his wife. Upon eval, pt reports he has had significant nausea today; presenting with decreased activity tolerance, strength, RUE edema. Pt reports R weakness since ACDF 2024, but exacerbated recently since surgery affecting ability to perform UB ADL and grooming. Pt needing min A for transfers this session. Currently requiring assist with BADL. Recommend discharge home with [facility] and wife to assist as needed.       If plan is discharge home, recommend the following: A little help with walking and/or transfers;A little help with bathing/dressing/bathroom;A lot of help with bathing/dressing/bathroom;Assistance with cooking/housework;Assist for transportation;Help with stairs or ramp for entrance    Functional Status Assessment  Patient has had a recent decline in their functional status and demonstrates the ability to make significant improvements in function in a reasonable and predictable amount of time.  Equipment Recommendations  None recommended by OT    Recommendations for Other Services       Precautions / Restrictions Precautions Precautions: Other (comment) Precaution Comments: L U weakness Required Braces or Orthoses:  (has abd binder but not donned up in chair on arrival, per wife MD said wear for comfort as tolerated) Restrictions Weight Bearing Restrictions Per Provider Order: No      Mobility Bed Mobility Overal bed mobility: Needs Assistance Bed Mobility:  Rolling, Sit to Sidelying Rolling: Min assist       Sit to sidelying: Min assist General bed mobility comments: to bring BLE into bed    Transfers Overall transfer level: Needs assistance Equipment used: Rolling walker (2 wheels) Transfers: Sit to/from Stand Sit to Stand: Min assist     Step pivot transfers: Contact guard assist     General transfer comment: for rise. Pulled up on RW to stand      Balance Overall balance assessment: Mild deficits observed, not formally tested                                         ADL either performed or assessed with clinical judgement   ADL Overall ADL's : Needs assistance/impaired   Eating/Feeding Details (indicate cue type and reason): ice chips only Grooming: Set up;Sitting Grooming Details (indicate cue type and reason): reporting was unable to hold toothbrush this morning but able to bring to mouth [facility] during session Upper Body Bathing: Minimal assistance;Sitting   Lower Body Bathing: Moderate assistance;Sit to/from stand   Upper Body Dressing : Minimal assistance;Sitting   Lower Body Dressing: Maximal assistance;Sit to/from stand   Toilet Transfer: Minimal assistance;Stand-pivot   Toileting- Clothing Manipulation and Hygiene: Moderate assistance;Sit to/from stand       Functional mobility during ADLs: Minimal assistance       Vision Ability to See in Adequate Light: 0 Adequate Patient Visual Report: No change from baseline Additional Comments: WFL for ADL     Perception Perception: Not tested       Praxis Praxis:  Not tested       Pertinent Vitals/Pain Pain Assessment Pain Assessment: Faces Faces Pain Scale: Hurts even more Pain Location: abdomen Pain Descriptors / Indicators: Grimacing Pain Intervention(s): Limited activity within patient's tolerance, Monitored during session     Extremity/Trunk Assessment Upper Extremity Assessment Upper Extremity Assessment: Right hand dominant;RUE  deficits/detail;LUE deficits/detail RUE Deficits / Details: RUE edetamous with min blistering to dorsum of forearm; possible IV infiltrate per wife. Weakness since ACDF with pt and wife reporting exacerbated weakness with admission and abd surgery. PROM WFL; AROM shoulder to ~15 degrees. 3/5 grip. pronation/supination WFL. Elbow flexion to ~110 degrees LUE Deficits / Details: impaired at baseline, length discrepancy from polio since 8 mos of age. weakness. unable to supinate. profoud weakness at shoulder. PROM not formally assessed   Lower Extremity Assessment Lower Extremity Assessment: Defer to PT evaluation   Cervical / Trunk Assessment Cervical / Trunk Assessment: Neck Surgery   Communication Communication Communication: No apparent difficulties   Cognition Arousal: Alert Behavior During Therapy: WFL for tasks assessed/performed Overall Cognitive Status: Within Functional Limits for tasks assessed                                 General Comments: minimal lethargy needing some repeated information during new learning about abd precautions     General Comments  R UE edema and a few blisters noted, nursing aware    Exercises     Shoulder Instructions      Home Living Family/patient expects to be discharged to:: Private residence Living Arrangements: Spouse/significant other;Children Available Help at Discharge: Family;Friend(s);Available 24 hours/day Type of Home: House Home Access: Stairs to enter [employer] of Steps: 1 Entrance Stairs-Rails: None Home Layout: Multi-level Alternate Level Stairs-Number of Steps: 7 Alternate Level Stairs-Rails: Right Bathroom Shower/Tub: Producer, Television/film/video: Standard Bathroom Accessibility: Yes   Home Equipment: Agricultural Consultant (2 wheels);Shower seat - built in;Grab bars - tub/shower   Additional Comments: patient can stay on den level which is no STE with full bath on same level      Prior  Functioning/Environment Prior Level of Function : Independent/Modified Independent;Driving             Mobility Comments: IND no AD for all ADLs, self care tasks and IADLs. ADLs Comments: prior to cervical surgery 03/2023 pt was avid golfer, following surgery R UE MMT and ROM deficits        OT Problem List: Decreased strength;Decreased activity tolerance;Impaired balance (sitting and/or standing);Decreased safety awareness;Decreased knowledge of use of DME or AE;Pain;Impaired UE functional use      OT Treatment/Interventions: Self-care/ADL training;Therapeutic exercise;DME and/or AE instruction;Balance training;Patient/family education;Therapeutic activities    OT Goals(Current goals can be found in the care plan section) Acute Rehab OT Goals Patient Stated Goal: get better OT Goal Formulation: With patient Time For Goal Achievement: 01/14/24 Potential to Achieve Goals: Good  OT Frequency: Min 1X/week    Co-evaluation              AM-PAC OT 6 Clicks Daily Activity     Outcome Measure Help from another person eating meals?: None Help from another person taking care of personal grooming?: A Little Help from another person toileting, which includes using toliet, bedpan, or urinal?: A Little Help from another person bathing (including washing, rinsing, drying)?: A Little Help from another person to put on and taking off regular upper body clothing?: A Little Help  from another person to put on and taking off regular lower body clothing?: A Lot 6 Click Score: 18   End of Session Equipment Utilized During Treatment: Gait belt Nurse Communication: Mobility status  Activity Tolerance: Patient tolerated treatment well Patient left: in bed;with call bell/phone within reach;with bed alarm set;with nursing/sitter in room;with family/visitor present  OT Visit Diagnosis: Unsteadiness on feet (R26.81);Muscle weakness (generalized) (M62.81)                Time: 8565-8475 OT Time  Calculation (min): 50 min Charges:  OT General Charges $OT Visit: 1 Visit OT Evaluation $OT Eval Moderate Complexity: 1 Mod OT Treatments $Self Care/Home Management : 23-37 mins  Elma JONETTA Lebron FREDERICK, OTR/L [facility] Acute Rehabilitation Office: [phone]   Elma JONETTA Lebron 12/31/2023, 3:54 PM

## 2024-01-01 ENCOUNTER — Inpatient Hospital Stay (HOSPITAL_COMMUNITY): Payer: Medicare Other

## 2024-01-01 DIAGNOSIS — E43 Unspecified severe protein-calorie malnutrition: Secondary | ICD-10-CM | POA: Diagnosis present

## 2024-01-01 DIAGNOSIS — I4891 Unspecified atrial fibrillation: Secondary | ICD-10-CM | POA: Diagnosis not present

## 2024-01-01 DIAGNOSIS — N179 Acute kidney failure, unspecified: Secondary | ICD-10-CM | POA: Diagnosis present

## 2024-01-01 DIAGNOSIS — K430 Incisional hernia with obstruction, without gangrene: Secondary | ICD-10-CM

## 2024-01-01 DIAGNOSIS — I2510 Atherosclerotic heart disease of native coronary artery without angina pectoris: Secondary | ICD-10-CM | POA: Diagnosis not present

## 2024-01-01 DIAGNOSIS — D649 Anemia, unspecified: Secondary | ICD-10-CM | POA: Diagnosis present

## 2024-01-01 LAB — PROTIME-INR
INR: 1.2 (ref 0.8–1.2)
Prothrombin Time: 15.6 s — ABNORMAL HIGH (ref 11.4–15.2)

## 2024-01-01 LAB — COMPREHENSIVE METABOLIC PANEL
ALT: 7 U/L (ref 0–44)
AST: 21 U/L (ref 15–41)
Albumin: 2.4 g/dL — ABNORMAL LOW (ref 3.5–5.0)
Alkaline Phosphatase: 58 U/L (ref 38–126)
Anion gap: 8 (ref 5–15)
BUN: 40 mg/dL — ABNORMAL HIGH (ref 8–23)
CO2: 20 mmol/L — ABNORMAL LOW (ref 22–32)
Calcium: 8 mg/dL — ABNORMAL LOW (ref 8.9–10.3)
Chloride: 106 mmol/L (ref 98–111)
Creatinine, Ser: 2.14 mg/dL — ABNORMAL HIGH (ref 0.61–1.24)
GFR, Estimated: 31 mL/min — ABNORMAL LOW (ref >60)
Glucose, Bld: 142 mg/dL — ABNORMAL HIGH (ref 70–99)
Potassium: 4.9 mmol/L (ref 3.5–5.1)
Sodium: 134 mmol/L — ABNORMAL LOW (ref 135–145)
Total Bilirubin: 0.6 mg/dL (ref 0.0–1.2)
Total Protein: 5.5 g/dL — ABNORMAL LOW (ref 6.5–8.1)

## 2024-01-01 LAB — ECHOCARDIOGRAM COMPLETE
AR max vel: 2.27 cm2
AV Area VTI: 2.51 cm2
AV Area mean vel: 1.99 cm2
AV Mean grad: 3 mm[Hg]
AV Peak grad: 4.9 mm[Hg]
Ao pk vel: 1.11 m/s
Area-P 1/2: 4.06 cm2
Calc EF: 65.9 %
Height: 69 in
MV VTI: 2.41 cm2
S' Lateral: 2.6 cm
Single Plane A2C EF: 72.1 %
Single Plane A4C EF: 56.3 %
Weight: 2896 [oz_av]

## 2024-01-01 LAB — CBC
HCT: 35.7 % — ABNORMAL LOW (ref 39.0–52.0)
Hemoglobin: 11.5 g/dL — ABNORMAL LOW (ref 13.0–17.0)
MCH: 31.4 pg (ref 26.0–34.0)
MCHC: 32.2 g/dL (ref 30.0–36.0)
MCV: 97.5 fL (ref 80.0–100.0)
Platelets: 177 10*3/uL (ref 150–400)
RBC: 3.66 MIL/uL — ABNORMAL LOW (ref 4.22–5.81)
RDW: 14.5 % (ref 11.5–15.5)
WBC: 10.9 10*3/uL — ABNORMAL HIGH (ref 4.0–10.5)
nRBC: 0 % (ref 0.0–0.2)

## 2024-01-01 LAB — TROPONIN I (HIGH SENSITIVITY)
Troponin I (High Sensitivity): 24 ng/L — ABNORMAL HIGH (ref <18)
Troponin I (High Sensitivity): 23 ng/L — ABNORMAL HIGH (ref <18)

## 2024-01-01 LAB — POTASSIUM: Potassium: 5 mmol/L (ref 3.5–5.1)

## 2024-01-01 LAB — APTT: aPTT: 40 s — ABNORMAL HIGH (ref 24–36)

## 2024-01-01 LAB — CREATININE, SERUM
Creatinine, Ser: 2.1 mg/dL — ABNORMAL HIGH (ref 0.61–1.24)
GFR, Estimated: 32 mL/min — ABNORMAL LOW (ref >60)

## 2024-01-01 LAB — MAGNESIUM: Magnesium: 2.1 mg/dL (ref 1.7–2.4)

## 2024-01-01 LAB — BRAIN NATRIURETIC PEPTIDE: B Natriuretic Peptide: 107.4 pg/mL — ABNORMAL HIGH (ref 0.0–100.0)

## 2024-01-01 MED ORDER — DILTIAZEM HCL-DEXTROSE 125-5 MG/125ML-% IV SOLN (PREMIX)
5.0000 mg/h | INTRAVENOUS | Status: DC
  Administered 2024-01-01: 5 mg/h via INTRAVENOUS
  Administered 2024-01-01 – 2024-01-02 (×2): 10 mg/h via INTRAVENOUS
  Filled 2024-01-01 (×3): qty 125

## 2024-01-01 MED ORDER — METOPROLOL TARTRATE 5 MG/5ML IV SOLN: 5.0000 mg | Freq: Once | INTRAVENOUS | Status: DC

## 2024-01-01 MED ORDER — SODIUM CHLORIDE 0.45 % IV SOLN
INTRAVENOUS | Status: AC
Start: 2024-01-01 — End: 2024-01-01

## 2024-01-01 MED ORDER — PANTOPRAZOLE SODIUM 40 MG IV SOLR
40.0000 mg | Freq: Once | INTRAVENOUS | Status: AC
  Administered 2024-01-01: 40 mg via INTRAVENOUS
  Filled 2024-01-01: qty 10

## 2024-01-01 MED ORDER — ALBUMIN HUMAN 25 % IV SOLN
50.0000 g | Freq: Once | INTRAVENOUS | Status: AC
  Administered 2024-01-01: 12.5 g via INTRAVENOUS
  Filled 2024-01-01: qty 200

## 2024-01-01 MED ORDER — ACETAMINOPHEN 500 MG PO TABS: 1000.0000 mg | ORAL_TABLET | Freq: Once | ORAL | Status: DC

## 2024-01-01 MED ORDER — ENOXAPARIN SODIUM 30 MG/0.3ML IJ SOSY
30.0000 mg | PREFILLED_SYRINGE | INTRAMUSCULAR | Status: DC
  Administered 2024-01-01: 30 mg via SUBCUTANEOUS
  Filled 2024-01-01: qty 0.3

## 2024-01-01 MED ORDER — LACTATED RINGERS IV BOLUS: 1000.0000 mL | Freq: Three times a day (TID) | INTRAVENOUS | Status: AC | PRN

## 2024-01-01 MED ORDER — ASPIRIN 81 MG PO TBEC: 81.0000 mg | DELAYED_RELEASE_TABLET | Freq: Every day | ORAL | Status: DC

## 2024-01-01 MED ORDER — LIDOCAINE VISCOUS HCL 2 % MT SOLN
10.0000 mL | Freq: Once | OROMUCOSAL | Status: AC
  Administered 2024-01-01: 10 mL via OROMUCOSAL
  Filled 2024-01-01: qty 15

## 2024-01-01 MED ORDER — BISACODYL 10 MG RE SUPP
10.0000 mg | Freq: Every day | RECTAL | Status: DC
  Administered 2024-01-03: 10 mg via RECTAL
  Filled 2024-01-01 (×3): qty 1

## 2024-01-01 MED ORDER — DILTIAZEM HCL 25 MG/5ML IV SOLN
INTRAVENOUS | Status: AC
  Filled 2024-01-01: qty 5

## 2024-01-01 MED ORDER — HYDROMORPHONE HCL 1 MG/ML IJ SOLN
0.5000 mg | INTRAMUSCULAR | Status: DC | PRN
  Administered 2024-01-04: 1 mg via INTRAVENOUS
  Filled 2024-01-01: qty 1

## 2024-01-01 MED ORDER — DILTIAZEM HCL 25 MG/5ML IV SOLN
10.0000 mg | INTRAVENOUS | Status: AC
  Administered 2024-01-01: 10 mg via INTRAVENOUS

## 2024-01-01 MED ORDER — SODIUM CHLORIDE 0.9 % IV BOLUS
250.0000 mL | Freq: Once | INTRAVENOUS | Status: AC
  Administered 2024-01-01: 250 mL via INTRAVENOUS

## 2024-01-01 MED ORDER — LIDOCAINE VISCOUS HCL 2 % MT SOLN: 15.0000 mL | Freq: Once | OROMUCOSAL | Status: DC

## 2024-01-01 MED ORDER — METOPROLOL TARTRATE 5 MG/5ML IV SOLN
5.0000 mg | Freq: Four times a day (QID) | INTRAVENOUS | Status: AC | PRN
  Administered 2024-01-03 – 2024-01-04 (×3): 5 mg via INTRAVENOUS
  Filled 2024-01-01 (×4): qty 5

## 2024-01-01 MED ORDER — POTASSIUM CHLORIDE IN NACL 20-0.9 MEQ/L-% IV SOLN
INTRAVENOUS | Status: AC
  Filled 2024-01-01 (×2): qty 1000

## 2024-01-01 NOTE — Progress Notes (Signed)
  Echocardiogram 2D Echocardiogram has been performed.  Ocie Doyne RDCS 01/01/2024, 1:10 PM

## 2024-01-01 NOTE — Progress Notes (Addendum)
 Patient transferred to stepdown unit.  Receiving IV fluid bolus.  Nursing team in room.  Nasogastric tube just placed.  Some release of 150 mL of dark green thick effluent.  No large volume.  Abdomen soft. ND NT  Patient alert self suctioning his mouth.  Denies much abdominal pain.  Just seen by Dr. Celinda with internal medicine hospitalist.  Discussed and updated with ICU team in room as well as patient's and patient's wife at bedside.  Questions answered.  Expressed understanding and appreciation.

## 2024-01-01 NOTE — Consult Note (Signed)
 Cardiology Consultation   Patient ID: Daryle Amis MRN: [id number]; DOB: [birth year]  Admit date: 12/29/2023 Date of Consult: 01/01/2024  PCP:  Paola Greig CROME, FNP   [facility] Providers Cardiologist:  Jerel Balding, MD   {   Patient Profile:   Muaad Boehning is a 78 y.o. 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 Celinda.  History of Present Illness:   Mr. Sao presented to [facility] on 12/29/2023 for planned robotic repair of ventral hernia and parastomal hernia along with lysis of adhesions. No immediate complications were noted. Blood work following surgery showed WBC 11.6, Hgb 11.7, platelets 168 and creatinine at 1.94 (baseline 1.5 - 1.6).   During the early morning hours of 01/01/2024, he was noted to go into atrial fibrillation with RVR. This was confirmed by EKG tracing which showed atrial fibrillation with RVR, heart rate 169 with LAD and inferior infarct pattern as noted on prior tracings. He received 10 mg of IV Cardizem  and 5 mg of IV Lopressor . He has now been started on IV Cardizem  with improvement in his rates. He was on Lopressor  50 mg twice daily at home and while this was ordered at 25 mg twice daily yesterday, it appears he did not receive it by review of the MAR due to soft BP.  On my interview patient reports no sense of palpitations or chest pain.  He has an NG tube in place for gastric  decompression, had had emesis earlier this morning.  He is not aware when he is in atrial fibrillation, not entirely clear how frequently this happens.  CHA2DS2-VASc score is 5, he is not on anticoagulation at home.   Past Medical History:  Diagnosis Date   Atrial fibrillation, rapid (HCC)    With high ventricular rates. Intol amio, was on Sotalol , now on BB   Bladder cancer (HCC) dx'd 10/2019   Bursitis of right shoulder    CAD (coronary artery disease) 11/2012   CABG x 4 using left internal mammary artery and right endovein harvest. (LIMA-LAD; VG-OM1, OM2, VG-PD), EF 55-60% at cath   Degenerative disc disease, lumbar    Dysrhythmia    Epigastric pain    Myoview 11/09/12 Showed evidence of reversible ischemia in both the inferior wall & the anteroapical distribution.    Erectile dysfunction    H/O ED while taking a beta-blocker   GERD (gastroesophageal reflux disease)    Gout    Hiatal hernia    History of kidney stones    Hyperlipidemia    Hypertension    Systemic HTN. Pt has H/O of ED when taking a beta-blocker. ECHO 11/26/08 Showed no evidence of any significant valvular disease, Estimated EF = 50-60%.   Mononeuritis of unspecified site    Nocturia    NSVT (nonsustained ventricular tachycardia) (HCC)    Obesity    Osteoarthritis 08/2019   right knee; cortisone shot    PAF (paroxysmal atrial fibrillation) (HCC)    Post  CABG. Amia intol. Was on Sotalol , now on Lopressor    Polio    Age 78. 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, LIMA-LAD;VG-OM1,OM2;VG-PD 12/15/2012   Seasonal allergies    Skin cancer (melanoma) (HCC) 08/04/2015   left side of face    Skin lesion of face    hx of    Past Surgical History:  Procedure Laterality Date   ANTERIOR CERVICAL DECOMPRESSION/DISCECTOMY FUSION 4 LEVELS N/A 04/11/2023   Procedure: Cervical three-four Cervical four-five Cervical five-six Cervical six-seven Anterior Cervical  Decompression Fusion;  Surgeon: Colon Shove, MD;  Location: [facility] OR;  Service: Neurosurgery;  Laterality: N/A;   COLONOSCOPY     CORONARY ARTERY BYPASS GRAFT  12/12/2012   CABG x 4 using left internal mammary artery and right endovein harvest. (LIMA-LAD; VG-OM1, OM2, VG-PD)   CYSTOSCOPY WITH INJECTION N/A 02/04/2021   Procedure: CYSTOSCOPY WITH INJECTION OF INDOCYANINE GREEN DYE;  Surgeon: Alvaro Hummer, MD;  Location: [facility];  Service: Urology;  Laterality: N/A;   EYE SURGERY  1996   had lasik surgery bilaterally   IR IMAGING GUIDED PORT INSERTION  09/10/2020   LEFT HEART CATHETERIZATION WITH CORONARY ANGIOGRAM N/A 12/08/2012   Procedure: LEFT HEART CATHETERIZATION WITH CORONARY ANGIOGRAM;  Surgeon: Jerel Balding, MD;  Location: [facility];  Service: Cardiovascular;  Laterality: N/A;   LYMPHADENECTOMY Bilateral 02/04/2021   Procedure: LYMPHADENECTOMY;  Surgeon: Alvaro Hummer, MD;  Location: [facility];  Service: Urology;  Laterality: Bilateral;   ROBOT ASSISTED LAPAROSCOPIC COMPLETE CYSTECT ILEAL CONDUIT N/A 02/04/2021   Procedure: XI ROBOTIC ASSISTED LAPAROSCOPIC COMPLETE CYSTOPROSTATECTOMY ILEAL CONDUIT;  Surgeon: Alvaro Hummer, MD;  Location: [facility];  Service: Urology;  Laterality: N/A;  6 HRS   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   SKIN CANCER EXCISION  2016   left side of face    TRANSURETHRAL RESECTION OF BLADDER TUMOR N/A 11/16/2019   Procedure: TRANSURETHRAL RESECTION OF BLADDER TUMOR (TURBT) WITH INSTILLATION OF POST OPERATIVE CHEMOTHERAPY;  Surgeon: Ottelin, Mark, MD;  Location: [facility];  Service: Urology;  Laterality: N/A;   TRANSURETHRAL RESECTION OF BLADDER TUMOR WITH MITOMYCIN -C N/A 08/12/2020   Procedure: TRANSURETHRAL RESECTION OF BLADDER TUMOR WITH GEMCITABINE ;  Surgeon: Elisabeth Valli BIRCH, MD;  Location: [facility];  Service: Urology;  Laterality: N/A;  1 HR   UPPER GI ENDOSCOPY     XI ROBOTIC ASSISTED PARASTOMAL HERNIA REPAIR N/A 12/29/2023   Procedure: XI ROBOTIC  ASSISTED PARASTOMAL HERNIA REPAIR WITH MESH, INCISIONAL AND ILEAL CONDUIT, LYSIS OF ADHESIONS, BILATERAL TAP BLOCK;  Surgeon: Sheldon Standing, MD;  Location: [facility];  Service: General;  Laterality: N/A;     Home Medications:  Prior to Admission medications   Medication Sig Start Date End Date Taking? Authorizing Provider  acetaminophen  (TYLENOL ) 500 MG tablet Take 1,000 mg by mouth every 8 (eight) hours as needed.   Yes [provider]  allopurinol  (ZYLOPRIM ) 300 MG tablet Take 300 mg by mouth daily.   Yes [provider]  ferrous sulfate  325 (65 FE) MG tablet Take 325 mg by mouth 3 (three) times a week.   Yes [provider]  fluticasone  (FLONASE ) 50 MCG/ACT nasal spray Place 2 sprays into both nostrils daily as needed for allergies.   Yes [provider]  metoprolol  tartrate (LOPRESSOR ) 50 MG tablet Take 1 tablet (50 mg total) by mouth 2 (two) times daily. 05/13/23  Yes Daneen Damien BROCKS, NP  simvastatin  (ZOCOR ) 40 MG tablet TAKE 1 TABLET BY MOUTH EVERYDAY AT BEDTIME 02/14/23  Yes Croitoru, Mihai, MD  HYDROcodone -acetaminophen  (NORCO/VICODIN) 5-325 MG tablet Take 1-2 tablets by mouth every 4 (four) hours as needed for moderate pain (pain score 4-6) or severe pain (pain score 7-10). 12/30/23   Sheldon Standing, MD    Inpatient Medications: Scheduled Meds:  bisacodyl   10 mg Rectal Daily   Chlorhexidine  Gluconate Cloth  6 each Topical Daily   enoxaparin  (LOVENOX ) injection  30 mg Subcutaneous Q24H   metoprolol  tartrate  5 mg Intravenous Once   sodium chloride  flush  10-40 mL Intracatheter Q12H   sodium chloride  flush  3 mL Intravenous Q12H   Continuous Infusions:  sodium chloride      0.9 % NaCl with KCl 20 mEq / L 50 mL/hr at 01/01/24 0912   diltiazem  (CARDIZEM ) infusion 5 mg/hr (01/01/24 0823)   lactated ringers      PRN Meds: sodium chloride , alum & mag hydroxide-simeth, [DISCONTINUED] diphenhydrAMINE  **OR** diphenhydrAMINE , fluticasone ,  HYDROcodone -acetaminophen , HYDROmorphone  (DILAUDID ) injection, lactated ringers , magic mouthwash, magnesium  hydroxide, menthol -cetylpyridinium, methocarbamol  (ROBAXIN ) injection, metoprolol  tartrate, ondansetron  **OR** ondansetron  (ZOFRAN ) IV, phenol, prochlorperazine  **OR** prochlorperazine , simethicone , sodium chloride  flush  Allergies:    Allergies  Allergen Reactions   Amiodarone      Hallucinations    Oxycodone  Other (See Comments)    Bad dreams and hallucinations    Social History:   Social History   Tobacco Use   Smoking status: Never   Smokeless tobacco: Never  Substance Use Topics   Alcohol use: Yes    Comment: 1 wine or 1 margarita once a month     Family History:    Family History  Problem Relation Age of Onset   CAD Father    Hypertension Father    AAA (abdominal aortic aneurysm) Father    Aneurysm Father        Cause of death   Heart attack Paternal Grandfather    Cancer Mother        Colon. Cause of death     ROS:  Please see the history of present illness. All other ROS reviewed and negative.     Physical Exam/Data:   Vitals:   01/01/24 0541 01/01/24 0646 01/01/24 0720 01/01/24 0900  BP: (!) 105/92 113/82 (!) 82/44 (!) 100/55  Pulse:  (!) 111 (!) 152 95  Resp:   18 (!) 22  Temp:      TempSrc:      SpO2: 96%  96% 93%  Weight:      Height:        Intake/Output Summary (Last 24 hours) at 01/01/2024 1018 Last data filed at 01/01/2024 0500 Gross per 24 hour  Intake 270 ml  Output 1950 ml  Net -1680 ml      12/29/2023    5:30 AM 12/14/2023   10:58 AM 09/05/2023   10:27 AM  Last 3 Weights  Weight (lbs) 181 lb 181 lb 181 lb 6.4 oz  Weight (kg) 82.101 kg 82.101 kg 82.283 kg     Body mass index is 26.73 kg/m.  General: Patient in no distress. HEENT: Conjunctiva and lids normal, NG tube in place. Neck: Supple, no elevated JVP. Lungs: Clear to auscultation, nonlabored breathing at rest. Cardiac: Irregularly irregular without gallop. Abdomen:  Mild soreness, bowel sounds present. Extremities: No pitting edema, distal pulses 2+. Skin: Warm and dry. Musculoskeletal: No kyphosis. Neuropsychiatric: Alert and oriented x3, affect grossly appropriate.   EKG:  The EKG was personally reviewed and  demonstrates: Atrial fibrillation with RVR, nonspecific ST-T wave abnormalities.  Telemetry:  Telemetry was personally reviewed and demonstrates: Atrial fibrillation with RVR.  Relevant CV Studies:  Echocardiogram: 10/2020 IMPRESSIONS    1. Left ventricular ejection fraction, by estimation, is 50%. The left  ventricle has low normal function with beat to beat variability. The left  ventricle has no regional wall motion abnormalities. There is mild left  ventricular hypertrophy. Left  ventricular diastolic parameters are indeterminate.   2. Right ventricular systolic function is normal. The right ventricular  size is normal. Tricuspid regurgitation signal is inadequate for assessing  PA pressure.   3. The mitral valve is normal in structure. Trivial mitral valve  regurgitation. No evidence of mitral stenosis.   4. The aortic valve is normal in structure. Aortic valve regurgitation is  not visualized. No aortic stenosis is present.   Laboratory Data:  High Sensitivity Troponin:   Recent Labs  Lab 01/01/24 0903  TROPONINIHS 24*     Chemistry Recent Labs  Lab 12/31/23 0425 01/01/24 0402 01/01/24 0903  NA  --   --  134*  K 5.1 5.0 4.9  CL  --   --  106  CO2  --   --  20*  GLUCOSE  --   --  142*  BUN  --   --  40*  CREATININE 2.02* 2.10* 2.14*  CALCIUM   --   --  8.0*  MG  --  2.1  --   GFRNONAA 33* 32* 31*  ANIONGAP  --   --  8    Recent Labs  Lab 01/01/24 0903  PROT 5.5*  ALBUMIN  2.4*  AST 21  ALT 7  ALKPHOS 58  BILITOT 0.6   Hematology Recent Labs  Lab 12/30/23 0557 12/31/23 0425 01/01/24 0402  WBC 11.6* 11.4* 10.9*  RBC 3.71* 3.30* 3.66*  HGB 11.7* 10.6* 11.5*  HCT 36.3* 32.1* 35.7*  MCV 97.8 97.3 97.5   MCH 31.5 32.1 31.4  MCHC 32.2 33.0 32.2  RDW 14.6 14.5 14.5  PLT 168 153 177    BNP Recent Labs  Lab 01/01/24 0903  BNP 107.4*     Radiology/Studies:  DG Chest Port 1 View Result Date: 01/01/2024 CLINICAL DATA:  Dyspnea EXAM: PORTABLE CHEST 1 VIEW COMPARISON:  Chest radiograph dated 08/09/2022. FINDINGS: An enteric tube terminates in the stomach. A right internal jugular central venous port catheter tip overlies the right atrium. The heart size and mediastinal contours are within normal limits. Vascular calcifications are seen in the aortic arch. There is mild left basilar atelectasis/airspace disease. A small left pleural effusion may contribute. There is minimal right basilar atelectasis. Fixation hardware overlies the cervical spine. Degenerative changes are seen in the spine. IMPRESSION: Mild left basilar atelectasis/airspace disease. A small left pleural effusion may contribute. Electronically Signed   By: Norman Hopper M.D.   On: 01/01/2024 09:24   DG Abd 1 View Result Date: 01/01/2024 CLINICAL DATA:  Enteric tube placement. EXAM: ABDOMEN - 1 VIEW COMPARISON:  CT abdomen and pelvis dated 08/09/2022. FINDINGS: An enteric tube terminates in the stomach. IMPRESSION: Enteric tube terminates in the stomach. Electronically Signed   By: Norman Hopper M.D.   On: 01/01/2024 09:23    Assessment and Plan:   1.  Atrial fibrillation with RVR in the postoperative setting.  Has prior history of same in 2013 and 2021, admits that he does not feel palpitations so it is not entirely clear that he does not have other episodes  in the interim.  CHA2DS2-VASc score is 5.  He is not on anticoagulation at home at this point.  There is question of prior intolerance to amiodarone  (hallucinations?) per chart review.  Currently started on IV diltiazem  and heart rate has come down into the 110-120 range.  Repeat echocardiogram ordered and pending.  2.  Multivessel CAD status post CABG in 2013 with LIMA to LAD, SVG  to OM1-OM2, and SVG to PDA.  Current cardiac enzyme trend does not suggest ACS and he reports no angina.  He is on Lopressor  and Zocor  at home, both presently held.  3.  CKD stage IIIb with AKI, baseline creatinine 1.5-1.6, creatinine up to 2.14 today.  4.  Status post hernia repair on January 2 with ongoing management by surgical team.  Continue IV diltiazem , will also have as needed IV Lopressor  orders written since he is n.p.o. at the present time with NG tube in place.  Once able to take p.o.'s, would get him started back on baseline beta-blocker and resume Zocor .  We will try and avoid amiodarone  given prior potential intolerance.  We did discuss initiating longer-term anticoagulation given his CHA2DS2-VASc score of 5, he would likely be a good candidate for Eliquis .   Risk Assessment/Risk Scores:    CHA2DS2-VASc Score = 5   This indicates a 7.2% annual risk of stroke. The patient's score is based upon: CHF History: 1 HTN History: 1 Diabetes History: 0 Stroke History: 0 Vascular Disease History: 1 Age Score: 2 Gender Score: 0   For questions or updates, please contact [facility] Please consult [email or web address] for contact info under    Signed, Jayson JUDITHANN Sierras, M.D., F.A.C.C.

## 2024-01-01 NOTE — Consult Note (Signed)
 Initial Consultation Note   Patient: Hunter [name] FMW:[id number] DOB: Jun 17, 1946 PCP: Paola Greig CROME, FNP DOA: 12/29/2023 DOS: the patient was seen and examined on 01/01/2024 Primary service: Sheldon Standing, MD  Referring physician: Standing Sheldon, MD. Reason for consult: Atrial fibrillation with RVR. Assessment and Plan: Principal Problem:   Incarcerated incisional hernia   Parastomal hernia of ileal conduit Continue postop management per primary team.  Active Problems:   AKI (acute kidney injury) (HCC) Superimposed on:   CKD stage 3b, GFR 30-44 ml/min (HCC) Continue IV fluids. Currently on NS+KCl 20 at 50 mL/h. Will give albumin  50 g IVPB. 1/2 NS 1000 mL over 8 hours. BP has improved. -Avoid further hypotension. Avoid nephrotoxins. Monitor intake and output. Monitor renal function/electrolytes.    Paroxysmal atrial fibrillation (HCC) CHA?DS?-VASc Score of 5. Currently with a rapid ventricular response. Has responded to diltiazem , although at times HR in the 120s. I will check BMP, troponin and magnesium  level. Obtain an echocardiogram. Cardiology has been consulted. Will follow the recommendations. Okay for anticoagulation from general surgery perspective. -However, given recent procedure and no immediate AC need -Cardiology recommended Eliquis  after once he is on diet.    NSVT (nonsustained ventricular tachycardia), 9 beats 12/23 Continue cardiac telemetry. He has been treated with metoprolol . He is currently on Cardizem  infusion. Will follow cardiology recommendations.   Essential hypertension On IV Cardizem . As needed IV metoprolol  pushes.    CAD s/p CABG   S/P CABG x 4, 12/12/12, LIMA-LAD;VG-OM1,OM2;VG-PD Resume statin and oral beta-blocker once cleared for oral intake. Will likely be on DOAC therapy as well.    Normocytic anemia  Monitor hematocrit and hemoglobin. Transfuse PRBC as needed.    Protein-calorie malnutrition, severe (HCC)  In the setting  of postop period with Hx of anemia, and malignancy. He is currently NPO and has a NGT in place. May benefit from protein supplementation once cleared for oral intake. Consider nutritional services evaluation. Follow-up albumin /total protein level.    Port-A-Cath in place Monitor site.    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 Consider PT evaluation as needed.    MONONEURITIS OF UNSPECIFIED SITE Analgesics as needed.    Hyperlipidemia On simvastatin . Currently NPO.    GOUT Resume allopurinol  once cleared for oral intake.    GERD Will use pantoprazole  IV.   TRH will continue to follow the patient.  HPI: Hunter [name] is a 78 y.o. male with past medical history of paroxysmal atrial fibrillation episodes after procedures/hospitalizations, CAD s/p CABG, nonsustained V. tach, right shoulder bursitis, lumbar DDD unspecified mononeuritis, osteoarthritis, epigastric pain, hiatal hernia, GERD, nephrolithiasis, hyperlipidemia, unspecified obesity now overweight, postpolio syndrome with left arm atrophy, melanoma who 3 days ago underwent cystoprostatectomy with ileal, incisional and parastomal ileal conduit hernia repairs who we are seeing due to atrial fibrillation with RVR.  Mild dyspnea, but no chest pain, palpitations, dizziness or diaphoresis.  Review of Systems: As mentioned in the history of present illness. All other systems reviewed and are negative. Past Medical History:  Diagnosis Date   Atrial fibrillation, rapid (HCC)    With high ventricular rates. Intol amio, was on Sotalol , now on BB   Bladder cancer (HCC) dx'd 10/2019   Bursitis of right shoulder    CAD (coronary artery disease) 11/2012   CABG x 4 using left internal mammary artery and right endovein harvest. (LIMA-LAD; VG-OM1, OM2, VG-PD), EF 55-60% at cath   Degenerative disc disease, lumbar    Dysrhythmia  Epigastric pain    Myoview 11/09/12 Showed evidence of  reversible ischemia in both the inferior wall & the anteroapical distribution.    Erectile dysfunction    H/O ED while taking a beta-blocker   GERD (gastroesophageal reflux disease)    Gout    Hiatal hernia    History of kidney stones    Hyperlipidemia    Hypertension    Systemic HTN. Pt has H/O of ED when taking a beta-blocker. ECHO 11/26/08 Showed no evidence of any significant valvular disease, Estimated EF = 50-60%.   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 Amia intol. Was on Sotalol , now on Lopressor    Polio    Age 78. 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, LIMA-LAD;VG-OM1,OM2;VG-PD 12/15/2012   Seasonal allergies    Skin cancer (melanoma) (HCC) 08/04/2015   left side of face    Skin lesion of face    hx of   Past Surgical History:  Procedure Laterality Date   ANTERIOR CERVICAL DECOMPRESSION/DISCECTOMY FUSION 4 LEVELS N/A 04/11/2023   Procedure: Cervical three-four Cervical four-five Cervical five-six Cervical six-seven Anterior Cervical Decompression Fusion;  Surgeon: Colon Shove, MD;  Location: [facility];  Service: Neurosurgery;  Laterality: N/A;   COLONOSCOPY     CORONARY ARTERY BYPASS GRAFT  12/12/2012   CABG x 4 using left internal mammary artery and right endovein harvest. (LIMA-LAD; VG-OM1, OM2, VG-PD)   CYSTOSCOPY WITH INJECTION N/A 02/04/2021   Procedure: CYSTOSCOPY WITH INJECTION OF INDOCYANINE GREEN DYE;  Surgeon: Alvaro Hummer, MD;  Location: [facility];  Service: Urology;  Laterality: N/A;   EYE SURGERY  1996   had lasik surgery bilaterally   IR IMAGING GUIDED PORT INSERTION  09/10/2020   LEFT HEART CATHETERIZATION WITH CORONARY ANGIOGRAM N/A 12/08/2012   Procedure: LEFT HEART CATHETERIZATION WITH CORONARY ANGIOGRAM;  Surgeon:  Jerel Balding, MD;  Location: [facility];  Service: Cardiovascular;  Laterality: N/A;   LYMPHADENECTOMY Bilateral 02/04/2021   Procedure: LYMPHADENECTOMY;  Surgeon: Alvaro Hummer, MD;  Location: [facility];  Service: Urology;  Laterality: Bilateral;   ROBOT ASSISTED LAPAROSCOPIC COMPLETE CYSTECT ILEAL CONDUIT N/A 02/04/2021   Procedure: XI ROBOTIC ASSISTED LAPAROSCOPIC COMPLETE CYSTOPROSTATECTOMY ILEAL CONDUIT;  Surgeon: Alvaro Hummer, MD;  Location: [facility];  Service: Urology;  Laterality: N/A;  6 HRS   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   SKIN CANCER EXCISION  2016   left side of face    TRANSURETHRAL RESECTION OF BLADDER TUMOR N/A 11/16/2019   Procedure: TRANSURETHRAL RESECTION OF BLADDER TUMOR (TURBT) WITH INSTILLATION OF POST OPERATIVE CHEMOTHERAPY;  Surgeon: Ottelin, Mark, MD;  Location: [facility];  Service: Urology;  Laterality: N/A;   TRANSURETHRAL RESECTION OF BLADDER TUMOR WITH MITOMYCIN -C N/A 08/12/2020   Procedure: TRANSURETHRAL RESECTION OF BLADDER TUMOR WITH GEMCITABINE ;  Surgeon: Elisabeth Valli BIRCH, MD;  Location: [facility];  Service: Urology;  Laterality: N/A;  1 HR   UPPER GI ENDOSCOPY     XI ROBOTIC ASSISTED PARASTOMAL HERNIA REPAIR N/A 12/29/2023   Procedure: XI ROBOTIC ASSISTED PARASTOMAL HERNIA REPAIR WITH MESH, INCISIONAL AND ILEAL CONDUIT, LYSIS OF ADHESIONS, BILATERAL TAP BLOCK;  Surgeon: Sheldon Standing, MD;  Location: [facility];  Service: General;  Laterality: N/A;  Social History:  reports that he has never smoked. He has never used smokeless tobacco. He reports current alcohol use. He reports that he does not use drugs.  Allergies  Allergen Reactions   Amiodarone      Hallucinations    Oxycodone  Other (See Comments)    Bad dreams and hallucinations    Family History  Problem Relation Age of Onset   CAD Father    Hypertension Father    AAA (abdominal aortic aneurysm) Father    Aneurysm Father        Cause of death   Heart attack Paternal Grandfather     Cancer Mother        Colon. Cause of death    Prior to Admission medications   Medication Sig Start Date End Date Taking? Authorizing Provider  acetaminophen  (TYLENOL ) 500 MG tablet Take 1,000 mg by mouth every 8 (eight) hours as needed.   Yes [provider]  allopurinol  (ZYLOPRIM ) 300 MG tablet Take 300 mg by mouth daily.   Yes [provider]  ferrous sulfate  325 (65 FE) MG tablet Take 325 mg by mouth 3 (three) times a week.   Yes [provider]  fluticasone  (FLONASE ) 50 MCG/ACT nasal spray Place 2 sprays into both nostrils daily as needed for allergies.   Yes [provider]  metoprolol  tartrate (LOPRESSOR ) 50 MG tablet Take 1 tablet (50 mg total) by mouth 2 (two) times daily. 05/13/23  Yes Monge, Damien BROCKS, NP  simvastatin  (ZOCOR ) 40 MG tablet TAKE 1 TABLET BY MOUTH EVERYDAY AT BEDTIME 02/14/23  Yes Croitoru, Mihai, MD  HYDROcodone -acetaminophen  (NORCO/VICODIN) 5-325 MG tablet Take 1-2 tablets by mouth every 4 (four) hours as needed for moderate pain (pain score 4-6) or severe pain (pain score 7-10). 12/30/23   Sheldon Standing, MD    Physical Exam: Vitals:   01/01/24 0503 01/01/24 0514 01/01/24 0541 01/01/24 0646  BP: 99/68  (!) 105/92 113/82  Pulse: (!) 55 (!) 151  (!) 111  Resp: 18     Temp: 98.3 F (36.8 C)     TempSrc: Oral     SpO2: 96%  96%   Weight:      Height:       Physical Exam Vitals and nursing note reviewed.  Constitutional:      General: He is awake. He is not in acute distress.    Appearance: Normal appearance. He is overweight. He is ill-appearing.     Interventions: Nasal cannula in place.  HENT:     Head: Normocephalic.     Nose: No rhinorrhea.     Mouth/Throat:     Mouth: Mucous membranes are moist.  Eyes:     General: No scleral icterus.    Pupils: Pupils are equal, round, and reactive to light.  Neck:     Vascular: No JVD.  Cardiovascular:     Rate and Rhythm: Tachycardia present. Rhythm irregular.     Heart sounds:  S1 normal and S2 normal.  Pulmonary:     Effort: Pulmonary effort is normal.     Breath sounds: Normal breath sounds. No wheezing, rhonchi or rales.  Abdominal:     General: Bowel sounds are decreased. There is distension.     Palpations: Abdomen is soft.     Tenderness: There is abdominal tenderness. There is no guarding or rebound.     Comments: Mild distention.  Musculoskeletal:     Cervical back: Neck supple.     Right lower leg: No edema.  Left lower leg: No edema.  Skin:    General: Skin is warm and dry.  Neurological:     General: No focal deficit present.     Mental Status: He is alert and oriented to person, place, and time.  Psychiatric:        Mood and Affect: Mood normal.        Behavior: Behavior normal. Behavior is cooperative.     Data Reviewed:   Results are pending, will review when available.  11/11/2020 echocardiogram report. IMPRESSIONS:   1. Left ventricular ejection fraction, by estimation, is 50%. The left  ventricle has low normal function with beat to beat variability. The left  ventricle has no regional wall motion abnormalities. There is mild left  ventricular hypertrophy. Left  ventricular diastolic parameters are indeterminate.   2. Right ventricular systolic function is normal. The right ventricular  size is normal. Tricuspid regurgitation signal is inadequate for assessing  PA pressure.   3. The mitral valve is normal in structure. Trivial mitral valve  regurgitation. No evidence of mitral stenosis.   4. The aortic valve is normal in structure. Aortic valve regurgitation is  not visualized. No aortic stenosis is present.   EKG: Vent. rate 169 BPM PR interval * ms QRS duration 76 ms QT/QTcB 222/372 ms P-R-T axes * -34 90 Atrial fibrillation with rapid ventricular response Left axis deviation Inferior infarct , age undetermined Anterior infarct , age undetermined Abnormal ECG  Family Communication: His wife was at  bedside. Primary team communication: Updated primary team. Thank you very much for involving us  in the care of your patient.  Author: Alm Dorn Castor, MD 01/01/2024 7:58 AM  For on call review [email or web address].   This document was prepared using Dragon voice recognition software and may contain some unintended transcription errors.

## 2024-01-01 NOTE — Plan of Care (Signed)
  Problem: Clinical Measurements: Goal: Ability to maintain clinical measurements within normal limits will improve Outcome: Progressing Goal: Will remain free from infection Outcome: Progressing Goal: Diagnostic test results will improve Outcome: Progressing Goal: Respiratory complications will improve Outcome: Progressing Goal: Cardiovascular complication will be avoided Outcome: Progressing   Problem: Activity: Goal: Risk for activity intolerance will decrease Outcome: Progressing   Problem: Nutrition: Goal: Adequate nutrition will be maintained Outcome: Progressing   Problem: Pain Management: Goal: General experience of comfort will improve Outcome: Progressing

## 2024-01-01 NOTE — Progress Notes (Addendum)
 01/01/2024  Hunter [name] [id number] January 04, 1946  CARE TEAM: PCP: Paola Greig CROME, FNP  Outpatient Care Team: Patient Care Team: Paola Greig CROME, FNP as PCP - General (Family Medicine) Croitoru, Jerel, MD as PCP - Cardiology (Cardiology) Croitoru, Jerel, MD as Attending Physician (Cardiology) Mayo, Darice LABOR, FNP as WOC Nurse (Nurse Practitioner) Rogers Hai, MD as Medical Oncologist (Medical Oncology) Alvaro Ricardo KATHEE Raddle., MD as Consulting Physician (Urology) Colon Shove, MD as Consulting Physician (Neurosurgery) Sheldon Standing, MD as Consulting Physician (General Surgery)  Inpatient Treatment Team: Treatment Team:  Sheldon Standing, MD Cesario Mylinda KIDD, RN Payson, Comer CROME, PT Ovid, NT Florian Molder, RN Celinda Alm Lot, MD Erlenmeyer, Bernardino HERO, RN Bobbette Hercules, MD Lenon Glade LABOR, RN Laurice Ernst, [place]   Problem List:   Principal Problem:   Incarcerated incisional hernia Active Problems:   Hyperlipidemia   GOUT   MONONEURITIS OF UNSPECIFIED SITE   Essential hypertension   GERD   CAD s/p CABG   S/P CABG x 4, 12/12/12, LIMA-LAD;VG-OM1,OM2;VG-PD   Paroxysmal atrial fibrillation (HCC)   Port-A-Cath in place   Parastomal hernia of ileal conduit   History of poliomyelitis   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  5 x 4 cm region.  Primarily  closed.   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  13 x 5 cm region.  Right sided component separation TAR release done to allow largest 8x5cm defect to be primarily closed   Type of ventral wall repair:  Laparoscopic underlay repair .with Primary repair of largest hernia Placement of mesh: Retrorectus / Preperitoneal underlay repair Name of mesh: Bard Ventralight dual sided (polypropylene / Seprafilm) Size of mesh: 33x27cm Orientation:  Mostly vertical with slightly oblique to right lower quadrant Mesh overlap:  5-7cm   Assessment [facility] Stay = 3 days) 3 Days Post-Op    Struggling    Plan:  Paroxysmal atrial fibrillation.  Being transferred to stepdown unit.  I requested [facility] hospitalist consultation and A-fib orders.  Patient's blood pressure is a little soft so no Cardizem  bolus yet.  Giving a liter of IV fluids now.  Requested internal medicine consultation to help follow this patient who is getting complex with recurrent paroxysmal atrial fibrillation.  Hold off on cardiology consultation if things get under control easily unless internal medicine feels that is needed as well.  His abdomen is soft and not massively distended but with recurrent emesis will place NG tube for now for possible ileus and regular.  Hypokalemia.  Corrected.  Borderline high - follow  Elevated creatinine.  A little above his chronic kidney disease after his cystoprostatectomy and ureteral ileal conduit.  However he is nonoliguric.  In general would like to keep him on the dry side from a bowel and cardiopulmonary standpoint but will give IV fluids for now and regroup.  Antibiotics x 23 hours postop only.  Despite his events, he has no evidence of any peritonitis  incisions look fine so I do not think that there is any perforation abscess or cellulitis or infection.  I see no indication for a CT scan at this time since it would not be normal and he has no clinical evidence  of abdominal catastrophe at this time.  History of polio with chronic left upper extremity weakness.  Right upper extremity weakness postop continues to improve and he is back to using his right upper extremity rather independently, suctioning, holding grabbing and eating.  Have therapies to be involved and follow.  He gives me no other signs of neurological decline so I do not think he is having a stroke or other issue.  Follow closely.  Should improve.  History of gout.  Allopurinol  once ileus resolved.  History of allergies.  Continue Flonase   History of hypertension.  Usually just on metoprolol .  We have cut back to half dose yesterday so I do not know if not allow the A-fib to happen again or not.  Literature usually does not strongly say that.  See medicine thinks.  VTE prophylaxis- SCDs.  Enoxaparin .  Okay to do heparin  drip as needed given atrial fibrillation.  -mobilize as tolerated to help recovery   -Disposition:  Disposition: TBD T    I reviewed nursing notes, last 24 h vitals and pain scores, last 48 h intake and output, last 24 h labs and trends, and last 24 h imaging results.  I have reviewed this patient's available data, including medical history, events of note, test results, etc as part of my evaluation.   A significant portion of that time was spent in counseling. Care during the described time interval was provided by me.  This care required moderate level of medical decision making.  01/01/2024    Subjective: (Chief complaint)  Patient had some nausea and vomiting yesterday.  A few hours ago began to have some distress.  EKG showing atrial fibrillation.  Patient's been transferred to the stepdown unit.    Internal medicine has been consulted to help follow.  Wife and nursing team and rapid response nurse in room.  Patient has some soreness but denies much abdominal discomfort.  Just feels nauseated.  Objective:  Vital signs:  Vitals:   01/01/24  0514 01/01/24 0541 01/01/24 0646 01/01/24 0720  BP:  (!) 105/92 113/82 (!) 82/44  Pulse: (!) 151  (!) 111 (!) 152  Resp:    18  Temp:      TempSrc:      SpO2:  96%  96%  Weight:      Height:        Last BM Date : 12/28/23  Intake/Output   Yesterday:  01/04 0701 - 01/05 0700 In: 270 [P.O.:270] Out: 1950 [Urine:1550; Emesis/NG output:400] This shift:  No intake/output data recorded.  Bowel function:  Flatus: YES  BM:  No  Drain: (No drain)   Physical Exam:  General: Pt awake/alert in no acute distress.  Chatting and joking.  Tired Eyes: PERRL, normal EOM.  Sclera clear.  No icterus Neuro: CN II-XII intact w/o focal sensory/motor deficits.  Chronic left upper extremity weakness this is baseline.  He can elevate his right upper extremity up to his shoulder but not above his head.  No problem with passive motion though.  Good handgrip. Lymph: No head/neck/groin lymphadenopathy Psych:  No delerium/psychosis/paranoia.  Oriented x 4 HENT: Normocephalic, Mucus membranes moist.  No thrush Neck: Supple, No tracheal deviation.  No obvious thyromegaly Chest: No pain to chest  wall compression.  Good respiratory excursion.  No audible wheezing CV:  Pulses intact.  Regular rhythm.  No major extremity edema MS: Normal AROM mjr joints.  No obvious deformity  Abdomen: Soft.  Nondistended.  Incisions clean dry and intact.  No cellulitis.  Minimal soreness.  No guarding.  Mildly tender at incisions only.  No evidence of peritonitis.  No incarcerated hernias. Right lower quadrant ileal conduit urostomy pink with clear light yellow urine in bag  Ext:   No deformity.  No mjr edema.  No cyanosis Skin: No petechiae / purpurea.  No major sores.  Warm and dry    Results:   Cultures: No results found for this or any previous visit (from the past 720 hours).  Labs: Results for orders placed or performed during the hospital encounter of 12/29/23 (from the past 48 hours)  Potassium      Status: None   Collection Time: 12/31/23  4:25 AM  Result Value Ref Range   Potassium 5.1 3.5 - 5.1 mmol/L    Comment: Performed at [facility], [address] [place], [place] [address]  Creatinine, serum     Status: Abnormal   Collection Time: 12/31/23  4:25 AM  Result Value Ref Range   Creatinine, Ser 2.02 (H) 0.61 - 1.24 mg/dL   GFR, Estimated 33 (L) >60 mL/min    Comment: (NOTE) Calculated using the CKD-EPI Creatinine Equation (2021) Performed at [facility], [address], [place], [place] [address]   CBC     Status: Abnormal   Collection Time: 12/31/23  4:25 AM  Result Value Ref Range   WBC 11.4 (H) 4.0 - 10.5 K/uL   RBC 3.30 (L) 4.22 - 5.81 MIL/uL   Hemoglobin 10.6 (L) 13.0 - 17.0 g/dL   HCT 67.8 (L) 60.9 - 47.9 %   MCV 97.3 80.0 - 100.0 fL   MCH 32.1 26.0 - 34.0 pg   MCHC 33.0 30.0 - 36.0 g/dL   RDW 85.4 88.4 - 84.4 %   Platelets 153 150 - 400 K/uL   nRBC 0.0 0.0 - 0.2 %    Comment: Performed at [facility], [address], [place], [place] [address]  Potassium     Status: None   Collection Time: 01/01/24  4:02 AM  Result Value Ref Range   Potassium 5.0 3.5 - 5.1 mmol/L    Comment: Performed at [facility], [address], [place], [place] [address]  Creatinine, serum     Status: Abnormal   Collection Time: 01/01/24  4:02 AM  Result Value Ref Range   Creatinine, Ser 2.10 (H) 0.61 - 1.24 mg/dL   GFR, Estimated 32 (L) >60 mL/min    Comment: (NOTE) Calculated using the CKD-EPI Creatinine Equation (2021) Performed at [facility], [address] [place], [place] [address]   CBC     Status: Abnormal   Collection Time: 01/01/24  4:02 AM  Result Value Ref Range   WBC 10.9 (H) 4.0 - 10.5 K/uL   RBC 3.66 (L) 4.22 - 5.81 MIL/uL   Hemoglobin 11.5 (L) 13.0 - 17.0 g/dL   HCT 64.2 (L) 60.9 - 47.9 %   MCV 97.5 80.0 - 100.0 fL   MCH 31.4 26.0 - 34.0 pg   MCHC 32.2 30.0 -  36.0 g/dL   RDW 85.4 88.4 - 84.4 %   Platelets 177 150 - 400 K/uL   nRBC 0.0 0.0 - 0.2 %  Comment: Performed at [facility], [address], [place], [place] [address]    Imaging / Studies: No results found.  Medications / Allergies: per chart  Antibiotics: Anti-infectives (From admission, onward)    Start     Dose/Rate Route Frequency Ordered Stop   12/29/23 1600  ceFAZolin  (ANCEF ) IVPB 2g/100 mL premix        2 g 200 mL/hr over 30 Minutes Intravenous Every 8 hours 12/29/23 1401 12/30/23 0128   12/29/23 1600  metroNIDAZOLE  (FLAGYL ) IVPB 500 mg        500 mg 100 mL/hr over 60 Minutes Intravenous Every 8 hours 12/29/23 1401 12/30/23 0844   12/29/23 0600  ceFAZolin  (ANCEF ) IVPB 2g/100 mL premix       Placed in And Linked Group   2 g 200 mL/hr over 30 Minutes Intravenous On call to O.R. 12/29/23 9470 12/29/23 0734   12/29/23 0600  metroNIDAZOLE  (FLAGYL ) IVPB 500 mg       Placed in And Linked Group   500 mg 100 mL/hr over 60 Minutes Intravenous On call to O.R. 12/29/23 0529 12/29/23 0746         Note: Portions of this report may have been transcribed using voice recognition software. Every effort was made to ensure accuracy; however, inadvertent computerized transcription errors may be present.   Any transcriptional errors that result from this process are unintentional.    Elspeth KYM Schultze, MD, FACS, MASCRS Esophageal, Gastrointestinal & Colorectal Surgery Robotic and Minimally Invasive Surgery  [facility] [address], [address] [place], [place] [address] [phone] Fax [phone] Main  CONTACT INFORMATION: Weekday (9AM-5PM): Call [facility] main office at [phone] Weeknight (5PM-9AM) or Weekend/Holiday: Check EPIC Web Links tab & use AMION (password  [email or web address]) for General Surgery [facility] coverage  Please, DO NOT use SecureChat  (it is not reliable communication to reach operating  surgeons & will lead to a delay in care).   Epic staff messaging available for outptient concerns needing 1-2 business day response.      01/01/2024  8:15 AM

## 2024-01-02 DIAGNOSIS — I480 Paroxysmal atrial fibrillation: Secondary | ICD-10-CM

## 2024-01-02 DIAGNOSIS — N179 Acute kidney failure, unspecified: Secondary | ICD-10-CM

## 2024-01-02 DIAGNOSIS — I2510 Atherosclerotic heart disease of native coronary artery without angina pectoris: Secondary | ICD-10-CM

## 2024-01-02 DIAGNOSIS — K430 Incisional hernia with obstruction, without gangrene: Secondary | ICD-10-CM | POA: Diagnosis not present

## 2024-01-02 DIAGNOSIS — N1832 Chronic kidney disease, stage 3b: Secondary | ICD-10-CM

## 2024-01-02 DIAGNOSIS — M109 Gout, unspecified: Secondary | ICD-10-CM

## 2024-01-02 LAB — MAGNESIUM: Magnesium: 2.2 mg/dL (ref 1.7–2.4)

## 2024-01-02 LAB — BASIC METABOLIC PANEL
Anion gap: 9 (ref 5–15)
BUN: 40 mg/dL — ABNORMAL HIGH (ref 8–23)
CO2: 20 mmol/L — ABNORMAL LOW (ref 22–32)
Calcium: 8.3 mg/dL — ABNORMAL LOW (ref 8.9–10.3)
Chloride: 107 mmol/L (ref 98–111)
Creatinine, Ser: 1.94 mg/dL — ABNORMAL HIGH (ref 0.61–1.24)
GFR, Estimated: 35 mL/min — ABNORMAL LOW (ref >60)
Glucose, Bld: 134 mg/dL — ABNORMAL HIGH (ref 70–99)
Potassium: 4.1 mmol/L (ref 3.5–5.1)
Sodium: 136 mmol/L (ref 135–145)

## 2024-01-02 LAB — CBC
HCT: 28.5 % — ABNORMAL LOW (ref 39.0–52.0)
Hemoglobin: 9.4 g/dL — ABNORMAL LOW (ref 13.0–17.0)
MCH: 32 pg (ref 26.0–34.0)
MCHC: 33 g/dL (ref 30.0–36.0)
MCV: 96.9 fL (ref 80.0–100.0)
Platelets: 169 10*3/uL (ref 150–400)
RBC: 2.94 MIL/uL — ABNORMAL LOW (ref 4.22–5.81)
RDW: 14.3 % (ref 11.5–15.5)
WBC: 8.3 10*3/uL (ref 4.0–10.5)
nRBC: 0 % (ref 0.0–0.2)

## 2024-01-02 LAB — TSH: TSH: 1.454 u[IU]/mL (ref 0.350–4.500)

## 2024-01-02 LAB — HEPATIC FUNCTION PANEL
ALT: 8 U/L (ref 0–44)
AST: 19 U/L (ref 15–41)
Albumin: 3.2 g/dL — ABNORMAL LOW (ref 3.5–5.0)
Alkaline Phosphatase: 47 U/L (ref 38–126)
Bilirubin, Direct: 0.2 mg/dL (ref 0.0–0.2)
Indirect Bilirubin: 0.6 mg/dL (ref 0.3–0.9)
Total Bilirubin: 0.8 mg/dL (ref 0.0–1.2)
Total Protein: 5.8 g/dL — ABNORMAL LOW (ref 6.5–8.1)

## 2024-01-02 MED ORDER — METOPROLOL TARTRATE 25 MG PO TABS
50.0000 mg | ORAL_TABLET | Freq: Two times a day (BID) | ORAL | Status: DC
  Administered 2024-01-02 – 2024-01-03 (×3): 50 mg via ORAL
  Filled 2024-01-02 (×3): qty 2

## 2024-01-02 MED ORDER — ORAL CARE MOUTH RINSE
15.0000 mL | OROMUCOSAL | Status: DC | PRN
Start: 2024-01-02 — End: 2024-01-08

## 2024-01-02 MED ORDER — ENOXAPARIN SODIUM 40 MG/0.4ML IJ SOSY
40.0000 mg | PREFILLED_SYRINGE | Freq: Every day | INTRAMUSCULAR | Status: DC
  Administered 2024-01-02 – 2024-01-03 (×2): 40 mg via SUBCUTANEOUS
  Filled 2024-01-02 (×2): qty 0.4

## 2024-01-02 MED ORDER — BACLOFEN 10 MG PO TABS
5.0000 mg | ORAL_TABLET | Freq: Three times a day (TID) | ORAL | Status: DC | PRN
  Administered 2024-01-02: 5 mg via ORAL
  Filled 2024-01-02: qty 1

## 2024-01-02 MED ORDER — LORAZEPAM 2 MG/ML IJ SOLN
0.5000 mg | Freq: Three times a day (TID) | INTRAMUSCULAR | Status: DC | PRN
  Administered 2024-01-03: 1 mg via INTRAVENOUS
  Filled 2024-01-02: qty 1

## 2024-01-02 NOTE — Plan of Care (Signed)
  Problem: Health Behavior/Discharge Planning: Goal: Ability to manage health-related needs will improve Outcome: Progressing   Problem: Clinical Measurements: Goal: Ability to maintain clinical measurements within normal limits will improve Outcome: Progressing Goal: Will remain free from infection Outcome: Progressing Goal: Diagnostic test results will improve Outcome: Progressing Goal: Respiratory complications will improve Outcome: Progressing Goal: Cardiovascular complication will be avoided Outcome: Progressing   Problem: Coping: Goal: Level of anxiety will decrease Outcome: Progressing   Problem: Nutrition: Goal: Adequate nutrition will be maintained Outcome: Progressing   Problem: Activity: Goal: Risk for activity intolerance will decrease Outcome: Progressing

## 2024-01-02 NOTE — Hospital Course (Addendum)
 78 year old man PMH CAD (s/p CABG in 2013 with LIMA-LAD, SVG-OM1-OM2 and SVG-PDA), paroxysmal atrial fibrillation, NSVT, chronic kidney disease stage IIIb, hypertension, hyperlipidemia, gastroesophageal reflux disease, gout who presented to [facility] on 1/2 for robotic assisted ventral incisional abdominal wall hernia and parastomal hernia repair admitted postoperatively to the general surgery service with Dr. Sheldon.    In the days following the surgery the patient began to develop multiple complications including acute kidney injury superimposed upon his chronic kidney disease stage IIIb for which the hospitalist group was consulted as well as developing rapid atrial fibrillation for which cardiology was consulted.   Atrial fibrillation had proven to be difficult to manage, initially not responding well to diltiazem  or metoprolol .  Attempts were then made to load patient with digoxin  on 1/8 and after that was unsuccessful patient and patient began to convert into rapid atrial flutter they were placed on an amiodarone  infusion morning of 1/9 which proved to be effective.  Patient was initially anticoagulated with heparin  and later transitioned to Lovenox  due to elevated CHA2DS2-VASc score.  Patient's acute kidney injury gradually did improve with improving clinical condition and intravenous hydration.  Due to prolonged postoperative ileus patient was initiated on TPN on 1/8 for a brief period of time but with improving bowel function diet was advanced on 1/9 and TPN was discontinued.

## 2024-01-02 NOTE — Progress Notes (Addendum)
  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  Attending [facility]  Consultants [facility] Cardiology   Procedures/Events 1/2 ROBOTIC TRANSVERSUS ABDOMINIS RELEASE (TAR) COMPONENT SEPARATION - UNILATERAL ROBOTIC REPAIR OF ABDOMINAL HERNIA WITH MESH  ROBOTIC PARASTOMAL HERNIA REPAIR OF ILEAL CONDUIT WITH MESH (SUGARBAKER) ROBOTIC LYSIS OD ADHESIONS COLON REPAIR   Assessment and Plan: AKI  CKD stage 3b Baseline around 1.6-1.8.  Peak 2.14.  Better today at 1.94. Trend BMP.  Strict I's/O.  Acute hypoxia without specified respiratory failure Appears stable.  Wean oxygen.   Paroxysmal atrial fibrillation CHA?DS?-VASc Score of 5.  TSH within normal limits. Echo showed normal LVEF.  No significant valvular abnormalities noted. Heart rate low 100s.  Apparently asymptomatic.  Defer management of this to cardiology.  Currently on diltiazem  infusion.  Nurse feels that metoprolol  more effective. Defer anticoagulation to cardiology and surgery.   NSVT  None recently. Defer management to cardiology  Essential hypertension Stable.   CAD s/p CABG S/P CABG x 4, 12/12/12, LIMA-LAD;VG-OM1,OM2;VG-PD Management per cardiology   Normocytic anemia  Probable acute blood loss anemia perioperatively. Check hemoglobin in AM.   Protein-calorie malnutrition, severe (HCC)  In the setting of postop period with Hx of anemia, and malignancy. Currently NPO and has a NGT in place.   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   GERD PPI       Subjective:  Feels better, Like I could go home Knows that he is hallucinating somewhat Not oxygen at home  Physical Exam: Vitals:   01/02/24 0630 01/02/24 0700 01/02/24 0730 01/02/24 0800  BP: 115/61 111/61 114/61 (!) 129/57  Pulse: 80 (!) 110 88 97  Resp: 15 15 20 20   Temp:      TempSrc:      SpO2: 95% 92% 96% 96%  Weight:      Height:       Physical Exam Vitals reviewed.  Constitutional:      General: He is not in acute distress.    Appearance: He is not ill-appearing or toxic-appearing.  Cardiovascular:     Rate and Rhythm: Tachycardia present. Rhythm irregular.     Heart sounds: No murmur heard.    Comments: Telemetry atrial fibrillation 100s. Pulmonary:     Effort: Pulmonary effort is normal. No respiratory distress.     Breath sounds: No wheezing, rhonchi or rales.  Musculoskeletal:     Right lower leg: No edema.     Left lower leg: No edema.  Neurological:     Mental Status: He is alert.  Psychiatric:        Mood and Affect: Mood normal.        Behavior: Behavior normal.     Data Reviewed: Creatinine down to 1.94 Hgb down to 9.4  Family Communication: wife at bedside  Disposition: Status is: Inpatient Remains inpatient appropriate because: afib     Time spent: 35 minutes  Author: Toribio Door, MD 01/02/2024 10:11 AM  For on call review [email or web address].

## 2024-01-02 NOTE — Progress Notes (Signed)
 01/02/2024  Hunter [name] [id number] December 07, 1946  CARE TEAM: PCP: Paola Greig CROME, FNP  Outpatient Care Team: Patient Care Team: Paola Greig CROME, FNP as PCP - General (Family Medicine) Croitoru, Jerel, MD as PCP - Cardiology (Cardiology) Croitoru, Jerel, MD as Attending Physician (Cardiology) Mayo, Darice LABOR, FNP as WOC Nurse (Nurse Practitioner) Rogers Hai, MD as Medical Oncologist (Medical Oncology) Alvaro Ricardo KATHEE Raddle., MD as Consulting Physician (Urology) Colon Shove, MD as Consulting Physician (Neurosurgery) Sheldon Standing, MD as Consulting Physician (General Surgery)  Inpatient Treatment Team: Treatment Team:  Sheldon Standing, MD Celinda Alm Lot, MD Erlenmeyer, Bernardino HERO, RN Lbcardiology, Rounding, MD Laurice Ernst, NT Aurelia Jeffie DASEN, RN Shona Ward CROME, RN Bobbette Life, MD Lenon Glade LABOR, RN Sox, Thersia MATSU, [place] Carolee Rosaline DASEN, [place]   Problem List:   Principal Problem:   Incarcerated incisional hernia Active Problems:   Hyperlipidemia   GOUT   MONONEURITIS OF UNSPECIFIED SITE   Essential hypertension   GERD   CAD s/p CABG   S/P CABG x 4, 12/12/12, LIMA-LAD;VG-OM1,OM2;VG-PD   Paroxysmal atrial fibrillation (HCC)   NSVT (nonsustained ventricular tachycardia), 9 beats 12/23   Port-A-Cath in place   Parastomal hernia of ileal conduit   History of poliomyelitis   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   CKD stage 3b, GFR 30-44 ml/min (HCC)   AKI (acute kidney injury) (HCC)   Normocytic anemia   Protein-calorie malnutrition, severe (HCC)   12/29/2023  POST-OPERATIVE DIAGNOSIS:   Ventral incisional abdominal wall hernias 13x5cm region - incarcerated Parastomal ileal conduit hernia - incarcerated    PROCEDURE:   ROBOTIC TRANSVERSUS ABDOMINIS RELEASE (TAR) COMPONENT SEPARATION - UNILATERAL ROBOTIC REPAIR OF ABDOMINAL HERNIA WITH MESH  ROBOTIC PARASTOMAL HERNIA REPAIR OF ILEAL CONDUIT WITH MESH (SUGARBAKER) ROBOTIC LYSIS OD  ADHESIONS COLON REPAIR TAP BLOCK - BILATERAL   SURGEON:  Standing KYM Sheldon, MD  OR FINDINGS: Parastomal hernia around ileal conduit incarcerated with omentum.  5 x 4 cm region.  Primarily closed.   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  13 x 5 cm region.  Right sided component separation TAR release done to allow largest 8x5cm defect to be primarily closed   Type of ventral wall repair:  Laparoscopic underlay repair .with Primary repair of largest hernia Placement of mesh: Retrorectus / Preperitoneal underlay repair Name of mesh: Bard Ventralight dual sided (polypropylene / Seprafilm) Size of mesh: 33x27cm Orientation:  Mostly vertical with slightly oblique to right lower quadrant Mesh overlap:  5-7cm   Assessment [name] Hospital Stay = 4 days) 4 Days Post-Op    Stabilizing  A-fib rate controlled.    Plan:  Paroxysmal atrial fibrillation.  Rate better controlled on diltiazem  drip but blood pressure still little soft.  Appreciate medicine/cardiology help.  Keep in stepdown for now.  Cardiology recommends full anticoagulation given recurrent episodes of A-fib.  I am okay with doing heparin  drip if they have concern given his ileus.  I think the compromises to have him start Eliquis  once his bowels have woken up in the few days.  Defer to them.  NG tube output has been rather low.  Maybe a little flatus but no significant bowel movement.  Suspect more ileus than true early postop obstruction.  Had some hiccuping some hiccuping but feels better overall.  Not nauseated.  Will try NG tube clamping trial with clear liquids with low threshold to keep on lower minimal suction.  Patient has persistent ileus beyond postop day 7, may need  TPN.  Hopefully not too likely but we will see.  AKI in the setting of chronic kidney disease (creatinine baseline 1.5-1.8) from prior cystoprostatectomy and uretero-ileal urostomy conduit.  Cr peak of 2.1 coming down to 1.9 today.   He is nonoliguric and that seems to be stabilizing.  Agree with mild hydration.  Try to avoid fluid overload but try to keep him from being dehydrated.  Follow electrolytes.  Keep potassium above 4 and magnesium  above 2.  Appropriate range right now.  No evidence of peritonitis and I doubt any active infection at this time.    I think he is starting a little confused delirious with some sundowning.  Will offer some low-dose lorazepam  since he is in the stepdown unit to help him out.  History of polio with chronic left upper extremity weakness.  Right upper extremity weakness postop solved.  Same with edema.  Have therapies to be involved and follow.  He gives me no other signs of neurological decline so I do not think he is having a stroke or other issue.  Follow closely.  Should improve.  History of gout.  Allopurinol  once ileus resolved.  History of allergies.  Continue Flonase   History of hypertension.  Blood pressure little soft with atrial fibrillation.  Appreciate medicine/cardiology help. VTE prophylaxis- SCDs.  Enoxaparin .  Okay to do heparin  drip as needed given atrial fibrillation.  -mobilize as tolerated to help recovery   -Disposition:  Disposition: TBD T    I reviewed nursing notes, last 24 h vitals and pain scores, last 48 h intake and output, last 24 h labs and trends, and last 24 h imaging results.  I have reviewed this patient's available data, including medical history, events of note, test results, etc as part of my evaluation.   A significant portion of that time was spent in counseling. Care during the described time interval was provided by me.  This care required moderate level of medical decision making.  01/02/2024    Subjective: (Chief complaint)  Patient not sleeping well the past 48 hours and feeling a little agitated confused sometimes but completely oriented.  Wife sleeping in room.  Stepdown nursing just outside room.  He denies any abdominal  pain.  No nausea.  Some occasional hiccups.  Has not really had a bowel movement.  Feels like his urine output is improved.  Feels less swollen.  No complaints of right upper extremity weakness.  Objective:  Vital signs:  Vitals:   01/02/24 0400 01/02/24 0500 01/02/24 0600 01/02/24 0700  BP: 120/75 131/61 (!) 126/56 111/61  Pulse: 77 98 98 (!) 110  Resp: 14 19 19 15   Temp:      TempSrc:      SpO2: 95% 92% 96% 92%  Weight:      Height:        Last BM Date : 12/28/23  Intake/Output   Yesterday:  01/05 0701 - 01/06 0700 In: 1535.1 [I.V.:1353.3; IV Piggyback:181.9] Out: 1350 [Urine:1050; Emesis/NG output:300] This shift:  No intake/output data recorded.  Bowel function:  Flatus: YES  BM:  No  Drain: (No drain)   Physical Exam:  General: Pt awake/alert in no acute distress.  A little restless and pulling at cords but redirectable.  Still oriented not toxic/sickly. Eyes: PERRL, normal EOM.  Sclera clear.  No icterus Neuro: CN II-XII intact w/o focal sensory/motor deficits.  Chronic left upper extremity weakness this is baseline.  Right upper extremity with better active range of motion.  No problem with passive motion though.  Good handgrip. Lymph: No head/neck/groin lymphadenopathy Psych:  No delerium/psychosis/paranoia.  Oriented x 4 HENT: Normocephalic, Mucus membranes moist.  No thrush Neck: Supple, No tracheal deviation.  No obvious thyromegaly Chest: No pain to chest wall compression.  Good respiratory excursion.  No audible wheezing CV:  Pulses intact.  Regular rhythm.  No major extremity edema MS: Normal AROM mjr joints.  No obvious deformity  Abdomen: Soft.  Nondistended.  Incisions clean dry and intact.  No cellulitis.  Minimal soreness.  No guarding.  Mildly tender at incisions only.  No evidence of peritonitis.  No incarcerated hernias. Right lower quadrant ileal conduit urostomy pink with clear light yellow urine in bag  Ext:   No deformity.  No mjr  edema.  No cyanosis Skin: No petechiae / purpurea.  No major sores.  Warm and dry    Results:   Cultures: No results found for this or any previous visit (from the past 720 hours).  Labs: Results for orders placed or performed during the hospital encounter of 12/29/23 (from the past 48 hours)  Potassium     Status: None   Collection Time: 01/01/24  4:02 AM  Result Value Ref Range   Potassium 5.0 3.5 - 5.1 mmol/L    Comment: Performed at [facility], [address] [place], [place] [address]  Creatinine, serum     Status: Abnormal   Collection Time: 01/01/24  4:02 AM  Result Value Ref Range   Creatinine, Ser 2.10 (H) 0.61 - 1.24 mg/dL   GFR, Estimated 32 (L) >60 mL/min    Comment: (NOTE) Calculated using the CKD-EPI Creatinine Equation (2021) Performed at [facility], [address], [place], [place] [address]   CBC     Status: Abnormal   Collection Time: 01/01/24  4:02 AM  Result Value Ref Range   WBC 10.9 (H) 4.0 - 10.5 K/uL   RBC 3.66 (L) 4.22 - 5.81 MIL/uL   Hemoglobin 11.5 (L) 13.0 - 17.0 g/dL   HCT 64.2 (L) 60.9 - 47.9 %   MCV 97.5 80.0 - 100.0 fL   MCH 31.4 26.0 - 34.0 pg   MCHC 32.2 30.0 - 36.0 g/dL   RDW 85.4 88.4 - 84.4 %   Platelets 177 150 - 400 K/uL   nRBC 0.0 0.0 - 0.2 %    Comment: Performed at [facility], [address] [place], [place] [address]  Magnesium      Status: None   Collection Time: 01/01/24  4:02 AM  Result Value Ref Range   Magnesium  2.1 1.7 - 2.4 mg/dL    Comment: Performed at [facility], [address] [place], [place] [address]  Comprehensive metabolic panel     Status: Abnormal   Collection Time: 01/01/24  9:03 AM  Result Value Ref Range   Sodium 134 (L) 135 - 145 mmol/L   Potassium 4.9 3.5 - 5.1 mmol/L   Chloride 106 98 - 111 mmol/L   CO2 20 (L) 22 - 32 mmol/L   Glucose, Bld 142 (H) 70 - 99 mg/dL    Comment: Glucose reference range applies only to  samples taken after fasting for at least 8 hours.   BUN 40 (H) 8 - 23 mg/dL   Creatinine, Ser 7.85 (H) 0.61 - 1.24 mg/dL   Calcium  8.0 (L) 8.9 - 10.3 mg/dL   Total Protein 5.5 (L) 6.5 - 8.1 g/dL   Albumin  2.4 (L)  3.5 - 5.0 g/dL   AST 21 15 - 41 U/L   ALT 7 0 - 44 U/L   Alkaline Phosphatase 58 38 - 126 U/L   Total Bilirubin 0.6 0.0 - 1.2 mg/dL   GFR, Estimated 31 (L) >60 mL/min    Comment: (NOTE) Calculated using the CKD-EPI Creatinine Equation (2021)    Anion gap 8 5 - 15    Comment: Performed at [facility], [address] [place], [place] [address]  Troponin I (High Sensitivity)     Status: Abnormal   Collection Time: 01/01/24  9:03 AM  Result Value Ref Range   Troponin I (High Sensitivity) 24 (H) <18 ng/L    Comment: (NOTE) Elevated high sensitivity troponin I (hsTnI) values and significant  changes across serial measurements may suggest ACS but many other  chronic and acute conditions are known to elevate hsTnI results.  Refer to the Links section for chest pain algorithms and additional  guidance. Performed at [facility], [address] [place], [place] [address]   Brain natriuretic peptide     Status: Abnormal   Collection Time: 01/01/24  9:03 AM  Result Value Ref Range   B Natriuretic Peptide 107.4 (H) 0.0 - 100.0 pg/mL    Comment: Performed at [facility], [address] [place], [place] [address]  Protime-INR     Status: Abnormal   Collection Time: 01/01/24  1:02 PM  Result Value Ref Range   Prothrombin Time 15.6 (H) 11.4 - 15.2 seconds   INR 1.2 0.8 - 1.2    Comment: (NOTE) INR goal varies based on device and disease states. Performed at [facility], [address] [place], [place] [address]   APTT     Status: Abnormal   Collection Time: 01/01/24  1:02 PM  Result Value Ref Range   aPTT 40 (H) 24 - 36 seconds    Comment:        IF BASELINE aPTT IS ELEVATED, SUGGEST PATIENT  RISK ASSESSMENT BE USED TO DETERMINE APPROPRIATE ANTICOAGULANT THERAPY. Performed at [facility], [address] [place], [place] [address]   Troponin I (High Sensitivity)     Status: Abnormal   Collection Time: 01/01/24  1:02 PM  Result Value Ref Range   Troponin I (High Sensitivity) 23 (H) <18 ng/L    Comment: (NOTE) Elevated high sensitivity troponin I (hsTnI) values and significant  changes across serial measurements may suggest ACS but many other  chronic and acute conditions are known to elevate hsTnI results.  Refer to the Links section for chest pain algorithms and additional  guidance. Performed at [facility], [address], [place], [place] [address]   CBC     Status: Abnormal   Collection Time: 01/02/24  4:08 AM  Result Value Ref Range   WBC 8.3 4.0 - 10.5 K/uL   RBC 2.94 (L) 4.22 - 5.81 MIL/uL   Hemoglobin 9.4 (L) 13.0 - 17.0 g/dL   HCT 71.4 (L) 60.9 - 47.9 %   MCV 96.9 80.0 - 100.0 fL   MCH 32.0 26.0 - 34.0 pg   MCHC 33.0 30.0 - 36.0 g/dL   RDW 85.6 88.4 - 84.4 %   Platelets 169 150 - 400 K/uL   nRBC 0.0 0.0 - 0.2 %    Comment: Performed at [facility], [address], [place], [place] [address]  Basic metabolic panel     Status: Abnormal  Collection Time: 01/02/24  4:08 AM  Result Value Ref Range   Sodium 136 135 - 145 mmol/L   Potassium 4.1 3.5 - 5.1 mmol/L   Chloride 107 98 - 111 mmol/L   CO2 20 (L) 22 - 32 mmol/L   Glucose, Bld 134 (H) 70 - 99 mg/dL    Comment: Glucose reference range applies only to samples taken after fasting for at least 8 hours.   BUN 40 (H) 8 - 23 mg/dL   Creatinine, Ser 8.05 (H) 0.61 - 1.24 mg/dL   Calcium  8.3 (L) 8.9 - 10.3 mg/dL   GFR, Estimated 35 (L) >60 mL/min    Comment: (NOTE) Calculated using the CKD-EPI Creatinine Equation (2021)    Anion gap 9 5 - 15    Comment: Performed at [facility], [address], [place], [place] [address]   Magnesium      Status: None   Collection Time: 01/02/24  4:08 AM  Result Value Ref Range   Magnesium  2.2 1.7 - 2.4 mg/dL    Comment: Performed at [facility], [address] [place], [place] [address]  TSH     Status: None   Collection Time: 01/02/24  4:08 AM  Result Value Ref Range   TSH 1.454 0.350 - 4.500 uIU/mL    Comment: Performed by a 3rd Generation assay with a functional sensitivity of <=0.01 uIU/mL. Performed at [facility], [address], [place], [place] [address]   Hepatic function panel     Status: Abnormal   Collection Time: 01/02/24  4:08 AM  Result Value Ref Range   Total Protein 5.8 (L) 6.5 - 8.1 g/dL   Albumin  3.2 (L) 3.5 - 5.0 g/dL   AST 19 15 - 41 U/L   ALT 8 0 - 44 U/L   Alkaline Phosphatase 47 38 - 126 U/L   Total Bilirubin 0.8 0.0 - 1.2 mg/dL   Bilirubin, Direct 0.2 0.0 - 0.2 mg/dL   Indirect Bilirubin 0.6 0.3 - 0.9 mg/dL    Comment: Performed at [facility], [address], [place], [place] [address]    Imaging / Studies: ECHOCARDIOGRAM COMPLETE Result Date: 01/01/2024    ECHOCARDIOGRAM REPORT   Patient Name:   MAJOR SANTERRE Date of Exam: 01/01/2024 Medical Rec #:  [id number]       Height:       69.0 in Accession #:    [id number]      Weight:       181.0 lb Date of Birth:  29-Apr-1946      BSA:          1.980 m Patient Age:    77 years        BP:           115/72 mmHg Patient Gender: M               HR:           89 bpm. Exam Location:  Inpatient Procedure: 2D Echo, Cardiac Doppler and Color Doppler Indications:    CAD native vessel, A-Fib  History:        Patient has prior history of Echocardiogram examinations, most                 recent 11/11/2020. CAD, Prior CABG; Risk Factors:Hypertension                 and Dyslipidemia.  Sonographer:    Juanita Shaw Referring Phys: [id number]  DAVID MANUEL ORTIZ IMPRESSIONS  1. Left ventricular ejection fraction, by estimation, is 60 to 65%. The left ventricle has  normal function. Left ventricular endocardial border not optimally defined to evaluate regional wall motion. Left ventricular diastolic parameters are indeterminate.  2. Right ventricular systolic function was not well visualized. The right ventricular size is not well visualized.  3. The mitral valve is normal in structure. No evidence of mitral valve regurgitation. No evidence of mitral stenosis.  4. The aortic valve is tricuspid. Aortic valve regurgitation is not visualized. No aortic stenosis is present.  5. Aortic dilatation noted. There is mild dilatation of the ascending aorta, measuring 37 mm. FINDINGS  Left Ventricle: Left ventricular ejection fraction, by estimation, is 60 to 65%. The left ventricle has normal function. Left ventricular endocardial border not optimally defined to evaluate regional wall motion. The left ventricular internal cavity size was normal in size. There is no left ventricular hypertrophy. Left ventricular diastolic parameters are indeterminate. Right Ventricle: The right ventricular size is not well visualized. Right vetricular wall thickness was not well visualized. Right ventricular systolic function was not well visualized. Left Atrium: Left atrial size was not well visualized. Right Atrium: Right atrial size was not well visualized. Pericardium: There is no evidence of pericardial effusion. Mitral Valve: The mitral valve is normal in structure. No evidence of mitral valve regurgitation. No evidence of mitral valve stenosis. MV peak gradient, 2.6 mmHg. The mean mitral valve gradient is 1.0 mmHg. Tricuspid Valve: The tricuspid valve is not well visualized. Tricuspid valve regurgitation is trivial. No evidence of tricuspid stenosis. Aortic Valve: The aortic valve is tricuspid. Aortic valve regurgitation is not visualized. No aortic stenosis is present. Aortic valve mean gradient measures 3.0 mmHg. Aortic valve peak gradient measures 4.9 mmHg. Aortic valve area, by VTI measures 2.51  cm. Pulmonic Valve: The pulmonic valve was not well visualized. Pulmonic valve regurgitation is not visualized. No evidence of pulmonic stenosis. Aorta: The aortic root is normal in size and structure and aortic dilatation noted. There is mild dilatation of the ascending aorta, measuring 37 mm. IAS/Shunts: The interatrial septum was not well visualized.  LEFT VENTRICLE PLAX 2D LVIDd:         3.80 cm      Diastology LVIDs:         2.60 cm      LV e' medial:   9.25 cm/s LV PW:         0.70 cm      LV E/e' medial: 7.1 LV IVS:        0.80 cm LVOT diam:     2.00 cm LV SV:         39 LV SV Index:   20 LVOT Area:     3.14 cm  LV Volumes (MOD) LV vol d, MOD A2C: 69.6 ml LV vol d, MOD A4C: 106.0 ml LV vol s, MOD A2C: 19.4 ml LV vol s, MOD A4C: 46.3 ml LV SV MOD A2C:     50.2 ml LV SV MOD A4C:     106.0 ml LV SV MOD BP:      58.7 ml RIGHT VENTRICLE RV Basal diam:  3.30 cm RV Mid diam:    2.00 cm RV S prime:     9.57 cm/s LEFT ATRIUM           Index        RIGHT ATRIUM          Index LA diam:  3.00 cm 1.52 cm/m   RA Area:     8.86 cm LA Vol (A2C): 17.0 ml 8.59 ml/m   RA Volume:   15.60 ml 7.88 ml/m LA Vol (A4C): 31.5 ml 15.91 ml/m  AORTIC VALVE                    PULMONIC VALVE AV Area (Vmax):    2.27 cm     PV Vmax:       0.84 m/s AV Area (Vmean):   1.99 cm     PV Peak grad:  2.8 mmHg AV Area (VTI):     2.51 cm AV Vmax:           110.93 cm/s AV Vmean:          74.767 cm/s AV VTI:            0.154 m AV Peak Grad:      4.9 mmHg AV Mean Grad:      3.0 mmHg LVOT Vmax:         80.00 cm/s LVOT Vmean:        47.300 cm/s LVOT VTI:          0.123 m LVOT/AV VTI ratio: 0.80  AORTA Ao Root diam: 2.70 cm Ao Asc diam:  3.70 cm MITRAL VALVE MV Area (PHT): 4.06 cm    SHUNTS MV Area VTI:   2.41 cm    Systemic VTI:  0.12 m MV Peak grad:  2.6 mmHg    Systemic Diam: 2.00 cm MV Mean grad:  1.0 mmHg MV Vmax:       0.80 m/s MV Vmean:      31.5 cm/s MV Decel Time: 187 msec MV E velocity: 65.35 cm/s Dorn Ross MD Electronically  signed by Dorn Ross MD Signature Date/Time: 01/01/2024/1:49:27 PM    Final    DG Chest Port 1 View Result Date: 01/01/2024 CLINICAL DATA:  Dyspnea EXAM: PORTABLE CHEST 1 VIEW COMPARISON:  Chest radiograph dated 08/09/2022. FINDINGS: An enteric tube terminates in the stomach. A right internal jugular central venous port catheter tip overlies the right atrium. The heart size and mediastinal contours are within normal limits. Vascular calcifications are seen in the aortic arch. There is mild left basilar atelectasis/airspace disease. A small left pleural effusion may contribute. There is minimal right basilar atelectasis. Fixation hardware overlies the cervical spine. Degenerative changes are seen in the spine. IMPRESSION: Mild left basilar atelectasis/airspace disease. A small left pleural effusion may contribute. Electronically Signed   By: Norman Hopper M.D.   On: 01/01/2024 09:24   DG Abd 1 View Result Date: 01/01/2024 CLINICAL DATA:  Enteric tube placement. EXAM: ABDOMEN - 1 VIEW COMPARISON:  CT abdomen and pelvis dated 08/09/2022. FINDINGS: An enteric tube terminates in the stomach. IMPRESSION: Enteric tube terminates in the stomach. Electronically Signed   By: Norman Hopper M.D.   On: 01/01/2024 09:23    Medications / Allergies: per chart  Antibiotics: Anti-infectives (From admission, onward)    Start     Dose/Rate Route Frequency Ordered Stop   12/29/23 1600  ceFAZolin  (ANCEF ) IVPB 2g/100 mL premix        2 g 200 mL/hr over 30 Minutes Intravenous Every 8 hours 12/29/23 1401 12/30/23 0128   12/29/23 1600  metroNIDAZOLE  (FLAGYL ) IVPB 500 mg        500 mg 100 mL/hr over 60 Minutes Intravenous Every 8 hours 12/29/23 1401 12/30/23 0844   12/29/23 0600  ceFAZolin  (ANCEF ) IVPB 2g/100  mL premix       Placed in And Linked Group   2 g 200 mL/hr over 30 Minutes Intravenous On call to O.R. 12/29/23 9470 12/29/23 0734   12/29/23 0600  metroNIDAZOLE  (FLAGYL ) IVPB 500 mg       Placed in And  Linked Group   500 mg 100 mL/hr over 60 Minutes Intravenous On call to O.R. 12/29/23 0529 12/29/23 0746         Note: Portions of this report may have been transcribed using voice recognition software. Every effort was made to ensure accuracy; however, inadvertent computerized transcription errors may be present.   Any transcriptional errors that result from this process are unintentional.    Elspeth KYM Schultze, MD, FACS, MASCRS Esophageal, Gastrointestinal & Colorectal Surgery Robotic and Minimally Invasive Surgery  [facility] [address] [place], [place] [address] [phone] Fax [phone] Main  CONTACT INFORMATION: Weekday (9AM-5PM): Call [facility] main office at [phone] Weeknight (5PM-9AM) or Weekend/Holiday: Check EPIC Web Links tab & use AMION (password  [email or web address]) for General Surgery [facility] coverage  Please, DO NOT use SecureChat  (it is not reliable communication to reach operating surgeons & will lead to a delay in care).   Epic staff messaging available for outptient concerns needing 1-2 business day response.      01/02/2024  7:18 AM

## 2024-01-02 NOTE — Progress Notes (Signed)
 Patient Name: Hunter [name] Date of Encounter: 01/02/2024 [facility] Cardiologist: Jerel Balding, MD   Interval Summary  .    Feeling better.  Tolerated clear liquids this AM. No flatus or BM.   Vital Signs .    Vitals:   01/02/24 0900 01/02/24 0930 01/02/24 1000 01/02/24 1100  BP: 115/62 (!) 145/56 119/62   Pulse: 90 97 (!) 102 95  Resp: 12 18 15 17   Temp:      TempSrc:      SpO2: 100% 94% 95% 95%  Weight:      Height:        Intake/Output Summary (Last 24 hours) at 01/02/2024 1230 Last data filed at 01/02/2024 1041 Gross per 24 hour  Intake 1569.37 ml  Output 2050 ml  Net -480.63 ml      12/29/2023    5:30 AM 12/14/2023   10:58 AM 09/05/2023   10:27 AM  Last 3 Weights  Weight (lbs) 181 lb 181 lb 181 lb 6.4 oz  Weight (kg) 82.101 kg 82.101 kg 82.283 kg      Telemetry/ECG    Atrial fibrillation. Rate mostly <100 bpm.  - Personally Reviewed  Physical Exam .    VS:  BP 119/62   Pulse 95   Temp 98.4 F (36.9 C) (Axillary)   Resp 17   Ht 5' 9 (1.753 m)   Wt 82.1 kg   SpO2 95%   BMI 26.73 kg/m  , BMI Body mass index is 26.73 kg/m. GENERAL:  Well appearing HEENT: Pupils equal round and reactive, fundi not visualized, oral mucosa unremarkable.  NG tube in place.  NECK:  No jugular venous distention, waveform within normal limits, carotid upstroke brisk and symmetric, no bruits, no thyromegaly LUNGS:  Clear to auscultation bilaterally HEART:  Tachycardic.  Irregularly iregular.  PMI not displaced or sustained,S1 and S2 within normal limits, no S3, no S4, no clicks, no rubs, no murmurs ABD:  Flat, positive bowel sounds normal in frequency in pitch, no bruits, no rebound, no guarding, no midline pulsatile mass, no hepatomegaly, no splenomegaly EXT:  2 plus pulses throughout, no edema, no cyanosis no clubbing SKIN:  No rashes no nodules NEURO:  Cranial nerves II through XII grossly intact, motor grossly intact throughout PSYCH:  Cognitively intact,  oriented to person place and time   Assessment & Plan .     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  Postoperative ileus requiring NG tube placement.  Cardiology consulted for atrial fibrillation with RVR.  # Atrial fibrillation with RVR:  Rates are marginally controlled at this time on IV diltiazem .  He has been getting between 7.5 and 10 mg/h.  His bedside nurse notes that he responded well yesterday to IV metoprolol .  Now that he is taking oral medications we will plan to transition him back to his home metoprolol .  We can then wean off the IV diltiazem  and maintain heart rates under 100 bpm.  Recommend starting Eliquis  once stable per surgical team.    This patients CHA2DS2-VASc Score and unadjusted Ischemic Stroke Rate (% per year) is equal to 4.8 % stroke rate/year from a score of 4  Above score calculated as 1 point each if present [CHF, HTN, DM, Vascular=MI/PAD/Aortic Plaque, Age if 65-74, or Male] Above score calculated as 2 points each if present [Age > 75,  or Stroke/TIA/TE]  # CAD s/p CABG:  # Hyperlipidemia:  Not an active issue.  Resume simvastatin  and aspirin  once tolerating oral medications.   For questions or updates, please contact [facility] Please consult [email or web address] for contact info under        Signed, Annabella Scarce, MD

## 2024-01-02 NOTE — Plan of Care (Signed)
  Problem: Clinical Measurements: Goal: Ability to maintain clinical measurements within normal limits will improve Outcome: Not Progressing Goal: Diagnostic test results will improve Outcome: Not Progressing   Problem: Nutrition: Goal: Adequate nutrition will be maintained Outcome: Not Progressing   Problem: Elimination: Goal: Will not experience complications related to bowel motility Outcome: Not Progressing

## 2024-01-03 ENCOUNTER — Inpatient Hospital Stay (HOSPITAL_COMMUNITY): Payer: Medicare Other

## 2024-01-03 DIAGNOSIS — N179 Acute kidney failure, unspecified: Secondary | ICD-10-CM | POA: Diagnosis not present

## 2024-01-03 DIAGNOSIS — N1832 Chronic kidney disease, stage 3b: Secondary | ICD-10-CM | POA: Diagnosis not present

## 2024-01-03 LAB — BASIC METABOLIC PANEL
Anion gap: 8 (ref 5–15)
BUN: 41 mg/dL — ABNORMAL HIGH (ref 8–23)
CO2: 21 mmol/L — ABNORMAL LOW (ref 22–32)
Calcium: 8.2 mg/dL — ABNORMAL LOW (ref 8.9–10.3)
Chloride: 107 mmol/L (ref 98–111)
Creatinine, Ser: 1.68 mg/dL — ABNORMAL HIGH (ref 0.61–1.24)
GFR, Estimated: 42 mL/min — ABNORMAL LOW (ref >60)
Glucose, Bld: 120 mg/dL — ABNORMAL HIGH (ref 70–99)
Potassium: 3.7 mmol/L (ref 3.5–5.1)
Sodium: 136 mmol/L (ref 135–145)

## 2024-01-03 LAB — CBC
HCT: 31.6 % — ABNORMAL LOW (ref 39.0–52.0)
Hemoglobin: 10 g/dL — ABNORMAL LOW (ref 13.0–17.0)
MCH: 31.1 pg (ref 26.0–34.0)
MCHC: 31.6 g/dL (ref 30.0–36.0)
MCV: 98.1 fL (ref 80.0–100.0)
Platelets: 207 10*3/uL (ref 150–400)
RBC: 3.22 MIL/uL — ABNORMAL LOW (ref 4.22–5.81)
RDW: 14.4 % (ref 11.5–15.5)
WBC: 8.4 10*3/uL (ref 4.0–10.5)
nRBC: 0 % (ref 0.0–0.2)

## 2024-01-03 LAB — MRSA NEXT GEN BY PCR, NASAL: MRSA by PCR Next Gen: NOT DETECTED

## 2024-01-03 MED ORDER — METOPROLOL TARTRATE 25 MG PO TABS
75.0000 mg | ORAL_TABLET | Freq: Two times a day (BID) | ORAL | Status: DC
  Administered 2024-01-03: 75 mg via ORAL
  Filled 2024-01-03: qty 3

## 2024-01-03 MED ORDER — MELATONIN 5 MG PO TABS
5.0000 mg | ORAL_TABLET | Freq: Once | ORAL | Status: AC
  Administered 2024-01-03: 5 mg via ORAL
  Filled 2024-01-03: qty 1

## 2024-01-03 MED ORDER — DIATRIZOATE MEGLUMINE & SODIUM 66-10 % PO SOLN
90.0000 mL | Freq: Once | ORAL | Status: AC
  Administered 2024-01-03: 90 mL via NASOGASTRIC
  Filled 2024-01-03: qty 90

## 2024-01-03 MED ORDER — BACLOFEN 10 MG PO TABS
10.0000 mg | ORAL_TABLET | Freq: Three times a day (TID) | ORAL | Status: DC | PRN
  Administered 2024-01-03 (×2): 10 mg via ORAL
  Filled 2024-01-03 (×2): qty 1

## 2024-01-03 NOTE — TOC Initial Note (Signed)
 Transition of Care [facility]) - Initial/Assessment Note    Patient Details  Name: Hunter [name] MRN: [id number] Date of Birth: December 24, 1946  [facility] CM/SW Contact:    Tawni CHRISTELLA Eva, LCSW Phone Number: 01/03/2024, 3:49 PM  Clinical Narrative:                 CSW met with the pt and his wife to discuss home health recommendations. The p's wife would like [facility] home health. She denies any DME or transportation needs.  [facility] accepted the pt for HHPT and will need home health orders placed. TOC to follow for discharge needs.   Expected Discharge Plan: Home w Home Health Services Barriers to Discharge: Continued Medical Work up   Patient Goals and CMS Choice Patient states their goals for this hospitalization and ongoing recovery are:: retrun home with home health CMS Medicare.gov Compare Post Acute Care list provided to:: Patient Choice offered to / list presented to : Patient, Spouse      Expected Discharge Plan and Services       Living arrangements for the past 2 months: Single Family Home                                      Prior Living Arrangements/Services Living arrangements for the past 2 months: Single Family Home Lives with:: Self, Spouse              Current home services: DME    Activities of Daily Living   ADL Screening (condition at time of admission) Independently performs ADLs?: Yes (appropriate for developmental age) Is the patient deaf or have difficulty hearing?: No Does the patient have difficulty seeing, even when wearing glasses/contacts?: No Does the patient have difficulty concentrating, remembering, or making decisions?: No  Permission Sought/Granted                  Emotional Assessment              Admission diagnosis:  Incarcerated incisional hernia [K43.0] Patient Active Problem List   Diagnosis Date Noted   AKI (acute kidney injury) (HCC) 01/01/2024   Normocytic anemia 01/01/2024    Protein-calorie malnutrition, severe (HCC) 01/01/2024   History of poliomyelitis 12/30/2023   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 12/30/2023   CKD stage 3b, GFR 30-44 ml/min (HCC) 12/30/2023   Incarcerated incisional hernia 12/29/2023   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   Unilateral inguinal hernia without obstruction or gangrene 10/24/2023   Irritant contact dermatitis associated with urinary stoma 10/04/2023   Complication of Ileal conduit (HCC) 10/04/2023   Cervical myelopathy with cervical radiculopathy (HCC) 04/11/2023   Bladder cancer (HCC) 02/04/2021   Sepsis with acute renal failure and septic shock (HCC)    Hyponatremia 11/10/2020   Acute lower UTI 11/10/2020   Acute renal failure superimposed on stage 3a chronic kidney disease (HCC) 11/10/2020   Port-A-Cath in place 09/23/2020   Malignant neoplasm of urinary bladder (HCC) 09/03/2020   Goals of care, counseling/discussion 09/03/2020   NSVT (nonsustained ventricular tachycardia), 9 beats 12/23 12/18/2012   Abnormal nuclear cardiac imaging test as an OP 12/16/2012   Polio 12/16/2012   Exertional angina (HCC) 12/15/2012   CAD s/p CABG 12/15/2012   S/P CABG x 4, 12/12/12, LIMA-LAD;VG-OM1,OM2;VG-PD 12/15/2012   Paroxysmal atrial fibrillation (HCC) 12/15/2012   Hyperlipidemia 10/26/2010   GOUT 10/26/2010   MONONEURITIS OF UNSPECIFIED  SITE 10/26/2010   Essential hypertension 10/26/2010   GERD 10/26/2010   NEPHROLITHIASIS, HX OF 10/26/2010   PCP:  Paola Greig CROME, FNP Pharmacy:   [facility] [phone] [place], [place] - [address] AT [facility] [address] [place] [address] Phone: [phone] Fax: [phone]     Social Drivers of Health (SDOH) Social History: SDOH Screenings   Food Insecurity: No Food Insecurity (12/29/2023)  Housing: Low Risk  (12/29/2023)  Transportation Needs: No Transportation Needs (12/29/2023)  Utilities: Not At Risk (12/29/2023)  Depression  (PHQ2-9): Low Risk  (02/28/2023)  Social Connections: Unknown (12/29/2023)  Tobacco Use: Low Risk  (12/29/2023)   SDOH Interventions:     Readmission Risk Interventions     No data to display

## 2024-01-03 NOTE — Progress Notes (Signed)
 Physical Therapy Treatment Patient Details Name: Hunter [name] MRN: [id number] DOB: [birth year] Today's Date: 01/03/2024   History of Present Illness 78 yo male presents to therapy following hospital admission on 12/28/2022 for abdominal hernia repair. 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    PT Comments  Pt somewhat impulsive with mobility, motivated to improve and wanting to engage with therapy. Pt needing heavy verbal cues for log rolling and hand placement with transfers, educated pt on line management to improve safety with transfers from EOB and toilet. Pt amb with quick cadence to restroom, verbal cues for safe body positioning within RW frame in tight spaces. Pt amb with RW in hallway ~250 ft, step through gait pattern with quick cadence, appears to be rushing and encouraged by therapist and spouse for safe speed and increased line awareness, chair follow but not needed. Pt without overt LOB or LE buckling noted, reports fatigue at EOS but also noted to increase cadence to return to room despite chair follow for seated rest break as needed. HR varies heavily from 173 max to 107 minimum during session, majority being 120-140s- RN notified. Pt SpO2 and BP stable.   If plan is discharge home, recommend the following: A little help with walking and/or transfers;A little help with bathing/dressing/bathroom;Assistance with cooking/housework;Help with stairs or ramp for entrance;Assist for transportation   Can travel by private vehicle        Equipment Recommendations  None recommended by PT    Recommendations for Other Services       Precautions / Restrictions Precautions Precautions: Fall Precaution Comments: L U weakness, monitor HR Required Braces or Orthoses:  (has abd binder but not donned up in chair on arrival, per wife MD said wear for  comfort as tolerated) Restrictions Weight Bearing Restrictions Per Provider Order: No     Mobility  Bed Mobility Overal bed mobility: Needs Assistance Bed Mobility: Rolling, Sidelying to Sit Rolling: Supervision Sidelying to sit: Min assist       General bed mobility comments: min A to lift trunk into sitting from semi sidelying position, pt somewhat impulsive attempting to grab bedrail and pull trunk straight up into sitting with bedrail in teh way, education regarding head bedrail and log rolling for comfort    Transfers Overall transfer level: Needs assistance Equipment used: Rolling walker (2 wheels) Transfers: Sit to/from Stand Sit to Stand: Contact guard assist           General transfer comment: CGA for safety, pt attempting to power up and hurry to restroom despite lines and linen    Ambulation/Gait Ambulation/Gait assistance: Contact guard assist Gait Distance (Feet): 250 Feet Assistive device: Rolling walker (2 wheels) Gait Pattern/deviations: Step-through pattern, Trunk flexed       General Gait Details: pt with quick steps, appears to be rushing, heavy verbal cues for safety with mobility and line management, spouse following with chair for seated rest break if needed but pt increasing cadence to get back to room, no overt LOB or LE buckling noted   Stairs             Wheelchair Mobility     Tilt Bed    Modified Rankin (Stroke Patients Only)       Balance Overall balance assessment: Mild deficits observed, not formally tested  Cognition Arousal: Alert Behavior During Therapy: WFL for tasks assessed/performed, Impulsive Overall Cognitive Status: Within Functional Limits for tasks assessed                                 General Comments: somewhat impulsive, needing verbal cues for lines and mobility to improve safety        Exercises      General Comments  General comments (skin integrity, edema, etc.): R UE edema and a few blisters noted, nursing aware      Pertinent Vitals/Pain Pain Assessment Pain Assessment: No/denies pain    Home Living                          Prior Function            PT Goals (current goals can now be found in the care plan section) Acute Rehab PT Goals Patient Stated Goal: to be able to move my R UE, decrease pain and get back out on the golf course PT Goal Formulation: With patient Time For Goal Achievement: 01/13/24 Potential to Achieve Goals: Good Progress towards PT goals: Progressing toward goals    Frequency    Min 1X/week      PT Plan      Co-evaluation              AM-PAC PT 6 Clicks Mobility   Outcome Measure  Help needed turning from your back to your side while in a flat bed without using bedrails?: A Little Help needed moving from lying on your back to sitting on the side of a flat bed without using bedrails?: A Little Help needed moving to and from a bed to a chair (including a wheelchair)?: A Little Help needed standing up from a chair using your arms (e.g., wheelchair or bedside chair)?: A Little Help needed to walk in hospital room?: A Little Help needed climbing 3-5 steps with a railing? : A Lot 6 Click Score: 17    End of Session Equipment Utilized During Treatment: Gait belt Activity Tolerance: Patient tolerated treatment well Patient left: in chair;with call bell/phone within reach;with family/visitor present Nurse Communication: Mobility status;Other (comment) (HR) PT Visit Diagnosis: Other abnormalities of gait and mobility (R26.89);Difficulty in walking, not elsewhere classified (R26.2);Pain Pain - part of body:  (abdomen)     Time: 1426-1500 PT Time Calculation (min) (ACUTE ONLY): 34 min  Charges:    $Gait Training: 8-22 mins $Therapeutic Activity: 8-22 mins PT General Charges $$ ACUTE PT VISIT: 1 Visit                     Tori  Davionna Blacksher PT, DPT 01/03/24, 3:16 PM

## 2024-01-03 NOTE — Progress Notes (Signed)
 78/06/2024  Hunter [name] [id number] [birth year]  CARE TEAM: PCP: Paola Greig CROME, FNP  Outpatient Care Team: Patient Care Team: Paola Greig CROME, FNP as PCP - General (Family Medicine) Croitoru, Jerel, MD as PCP - Cardiology (Cardiology) Croitoru, Jerel, MD as Attending Physician (Cardiology) Mayo, Darice LABOR, FNP as WOC Nurse (Nurse Practitioner) Rogers Hai, MD as Medical Oncologist (Medical Oncology) Alvaro Ricardo KATHEE Raddle., MD as Consulting Physician (Urology) Colon Shove, MD as Consulting Physician (Neurosurgery) Sheldon Standing, MD as Consulting Physician (General Surgery)  Inpatient Treatment Team: Treatment Team:  Sheldon Standing, MD Lbcardiology, Rounding, MD Bobbette Life, MD Apickup-Pt, A, PT Aurelia Jeffie DASEN, RN Jadine Toribio SQUIBB, MD Dasie Gilda KATHEE, RN Carolee Rosaline DASEN, [facility]   Problem List:   Principal Problem:   Incarcerated incisional hernia Active Problems:   Hyperlipidemia   GOUT   MONONEURITIS OF UNSPECIFIED SITE   Essential hypertension   GERD   CAD s/p CABG   S/P CABG x 4, 12/12/12, LIMA-LAD;VG-OM1,OM2;VG-PD   Paroxysmal atrial fibrillation (HCC)   NSVT (nonsustained ventricular tachycardia), 9 beats 12/23   Port-A-Cath in place   Parastomal hernia of ileal conduit   History of poliomyelitis   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   CKD stage 3b, GFR 30-44 ml/min (HCC)   AKI (acute kidney injury) (HCC)   Normocytic anemia   Protein-calorie malnutrition, severe (HCC)   12/29/2023  POST-OPERATIVE DIAGNOSIS:   Ventral incisional abdominal wall hernias 13x5cm region - incarcerated Parastomal ileal conduit hernia - incarcerated    PROCEDURE:   ROBOTIC TRANSVERSUS ABDOMINIS RELEASE (TAR) COMPONENT SEPARATION - UNILATERAL ROBOTIC REPAIR OF ABDOMINAL HERNIA WITH MESH  ROBOTIC PARASTOMAL HERNIA REPAIR OF ILEAL CONDUIT WITH MESH (SUGARBAKER) ROBOTIC LYSIS OD ADHESIONS COLON REPAIR TAP BLOCK - BILATERAL   SURGEON:  Standing KYM Sheldon,  MD  OR FINDINGS: Parastomal hernia around ileal conduit incarcerated with omentum.  5 x 4 cm region.  Primarily closed.   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  13 x 5 cm region.  Right sided component separation TAR release done to allow largest 8x5cm defect to be primarily closed   Type of ventral wall repair:  Laparoscopic underlay repair .with Primary repair of largest hernia Placement of mesh: Retrorectus / Preperitoneal underlay repair Name of mesh: Bard Ventralight dual sided (polypropylene / Seprafilm) Size of mesh: 33x27cm Orientation:  Mostly vertical with slightly oblique to right lower quadrant Mesh overlap:  5-7cm   Assessment [facility] Stay = 5 days) 5 Days Post-Op    Stabilizing  A-fib rate persistent but rate better controlled.    Plan:  Low NG tube output and tolerated some clears with NG tube clamping trial but still hiccuping and still with no flatus nor bowel movement.  Will order small bowel protocol to see if there is objective proof of contrast getting into the colon.  If that is the case, remove NG tube and advance diet.  If not, keep on NG tube and regroup.  Most postop ileus is early obstructions and within the first 6 weeks or due to inflammation/adhesions and self resolved.  He is not clinically worsening but I am guarded against feeding him and discharging him home as he is hoping to happen soon   Paroxysmal atrial fibrillation.  Rate better controlled on metoprolol .  Cardiology has put him back on pills even though he still got an NG tube with no flatus or bowel movements.  Clinically it seems like he has had a response to the  medication but I am guarded that his ability to absorb oral pills is actually good right now.  Keep in stepdown for now.  Cardiology recommends full anticoagulation given recurrent episodes of A-fib.  I am okay with doing heparin  drip if they have concern given his ileus.  I think the compromises to  have him start Eliquis  once his bowels have woken up (the, NG tube is out and he is tolerating it at least full liquids )  AKI in the setting of chronic kidney disease (creatinine baseline 1.5-1.8) from prior cystoprostatectomy and uretero-ileal urostomy conduit.  Cr peak of 2.1 coming down to baseline range of 1.68.  He is nonoliguric and that seems to be stabilizing.  Keeping on the dry side for now.  Try to avoid dehydration with PRN (as needed) backups.  Follow electrolytes.  Keep potassium above 4 and magnesium  above 2.    No evidence of peritonitis and I doubt any active infection at this time.    I think he is starting a little confused delirious with some sundowning.  Will offer some low-dose lorazepam  since he is in the stepdown unit to help him out.  History of polio with chronic left upper extremity weakness.  Right upper extremity weakness postop solved.  Same with edema.  Have therapies to be involved and follow.  He gives me no other signs of neurological decline so I do not think he is having a stroke or other issue.  Follow closely.  Should improve.  History of gout.  Allopurinol  once ileus resolved.  History of allergies.  Continue Flonase   History of hypertension.  Blood pressure little soft with atrial fibrillation.  Appreciate medicine/cardiology help.  VTE prophylaxis- SCDs.  Enoxaparin .  Okay to do heparin  drip as needed given atrial fibrillation.  -mobilize as tolerated to help recovery   -Disposition:  Disposition: TBD T    I reviewed nursing notes, last 24 h vitals and pain scores, last 48 h intake and output, last 24 h labs and trends, and last 24 h imaging results.  I have reviewed this patient's available data, including medical history, events of note, test results, etc as part of my evaluation.   A significant portion of that time was spent in counseling. Care during the described time interval was provided by me.  This care required moderate level of  medical decision making.  78/06/2024    Subjective: (Chief complaint)  Patient feels better.  Tolerated drinking some liquid with the NG tube clamped.  Still no flatus or bowel movements.  Still hiccuping.  Hoping to go home soon  Objective:  Vital signs:  Vitals:   01/03/24 0300 01/03/24 0400 01/03/24 0500 01/03/24 0600  BP: (!) 121/57 110/71 106/76 111/71  Pulse: 92 80 83 94  Resp: 15 18 17 10   Temp: 98.1 F (36.7 C)     TempSrc: Oral     SpO2: 95% 96% 97% 99%  Weight:      Height:        Last BM Date : 12/28/23  Intake/Output   Yesterday:  01/06 0701 - 01/07 0700 In: 255.1 [P.O.:170; I.V.:85.1] Out: 1380 [Urine:1100; Emesis/NG output:280] This shift:  No intake/output data recorded.  Bowel function:  Flatus: No  BM:  No  Drain: NG tube: Bilious   Physical Exam:  General: Pt awake/alert in no acute distress.  Seems less confused/agitated.  Still oriented not toxic/sickly. Eyes: PERRL, normal EOM.  Sclera clear.  No icterus Neuro: CN II-XII intact w/o  focal sensory/motor deficits.  Chronic left upper extremity weakness this is baseline.  Right upper extremity with better active range of motion.  No problem with passive motion though.  Good handgrip. Lymph: No head/neck/groin lymphadenopathy Psych:  No delerium/psychosis/paranoia.  Oriented x 4 HENT: Normocephalic, Mucus membranes moist.  No thrush Neck: Supple, No tracheal deviation.  No obvious thyromegaly Chest: No pain to chest wall compression.  Good respiratory excursion.  No audible wheezing CV:  Pulses intact.  Regular rhythm.  No major extremity edema MS: Normal AROM mjr joints.  No obvious deformity  Abdomen: Soft.  Mildy distended.  Incisions clean dry and intact.  No cellulitis.  Minimal soreness.  No guarding.  Mildly tender at incisions only.  No evidence of peritonitis.  No incarcerated hernias. Right lower quadrant ileal conduit urostomy pink with clear light yellow urine in bag  Ext:    No deformity.  No mjr edema.  No cyanosis Skin: No petechiae / purpurea.  No major sores.  Warm and dry    Results:   Cultures: No results found for this or any previous visit (from the past 720 hours).  Labs: Results for orders placed or performed during the hospital encounter of 12/29/23 (from the past 48 hours)  Comprehensive metabolic panel     Status: Abnormal   Collection Time: 01/01/24  9:03 AM  Result Value Ref Range   Sodium 134 (L) 135 - 145 mmol/L   Potassium 4.9 3.5 - 5.1 mmol/L   Chloride 106 98 - 111 mmol/L   CO2 20 (L) 22 - 32 mmol/L   Glucose, Bld 142 (H) 70 - 99 mg/dL    Comment: Glucose reference range applies only to samples taken after fasting for at least 8 hours.   BUN 40 (H) 8 - 23 mg/dL   Creatinine, Ser 7.85 (H) 0.61 - 1.24 mg/dL   Calcium  8.0 (L) 8.9 - 10.3 mg/dL   Total Protein 5.5 (L) 6.5 - 8.1 g/dL   Albumin  2.4 (L) 3.5 - 5.0 g/dL   AST 21 15 - 41 U/L   ALT 7 0 - 44 U/L   Alkaline Phosphatase 58 38 - 126 U/L   Total Bilirubin 0.6 0.0 - 1.2 mg/dL   GFR, Estimated 31 (L) >60 mL/min    Comment: (NOTE) Calculated using the CKD-EPI Creatinine Equation (2021)    Anion gap 8 5 - 15    Comment: Performed at [facility], [address], [place], [place] [address]  Troponin I (High Sensitivity)     Status: Abnormal   Collection Time: 01/01/24  9:03 AM  Result Value Ref Range   Troponin I (High Sensitivity) 24 (H) <18 ng/L    Comment: (NOTE) Elevated high sensitivity troponin I (hsTnI) values and significant  changes across serial measurements may suggest ACS but many other  chronic and acute conditions are known to elevate hsTnI results.  Refer to the Links section for chest pain algorithms and additional  guidance. Performed at [facility], [address] [place], [place] [address]   Brain natriuretic peptide     Status: Abnormal   Collection Time: 01/01/24  9:03 AM  Result Value Ref Range   B  Natriuretic Peptide 107.4 (H) 0.0 - 100.0 pg/mL    Comment: Performed at [facility], [address] [place], [place] [address]  Protime-INR     Status: Abnormal   Collection Time: 01/01/24  1:02 PM  Result Value Ref Range  Prothrombin Time 15.6 (H) 11.4 - 15.2 seconds   INR 1.2 0.8 - 1.2    Comment: (NOTE) INR goal varies based on device and disease states. Performed at [facility], [address], [place], [place] [address]   APTT     Status: Abnormal   Collection Time: 01/01/24  1:02 PM  Result Value Ref Range   aPTT 40 (H) 24 - 36 seconds    Comment:        IF BASELINE aPTT IS ELEVATED, SUGGEST PATIENT RISK ASSESSMENT BE USED TO DETERMINE APPROPRIATE ANTICOAGULANT THERAPY. Performed at [facility], [address], [place], [place] [address]   Troponin I (High Sensitivity)     Status: Abnormal   Collection Time: 01/01/24  1:02 PM  Result Value Ref Range   Troponin I (High Sensitivity) 23 (H) <18 ng/L    Comment: (NOTE) Elevated high sensitivity troponin I (hsTnI) values and significant  changes across serial measurements may suggest ACS but many other  chronic and acute conditions are known to elevate hsTnI results.  Refer to the Links section for chest pain algorithms and additional  guidance. Performed at [facility], [address], [place], [place] [address]   CBC     Status: Abnormal   Collection Time: 01/02/24  4:08 AM  Result Value Ref Range   WBC 8.3 4.0 - 10.5 K/uL   RBC 2.94 (L) 4.22 - 5.81 MIL/uL   Hemoglobin 9.4 (L) 13.0 - 17.0 g/dL   HCT 71.4 (L) 60.9 - 47.9 %   MCV 96.9 80.0 - 100.0 fL   MCH 32.0 26.0 - 34.0 pg   MCHC 33.0 30.0 - 36.0 g/dL   RDW 85.6 88.4 - 84.4 %   Platelets 169 150 - 400 K/uL   nRBC 0.0 0.0 - 0.2 %    Comment: Performed at [facility], [address], [place], [place] [address]  Basic metabolic panel     Status: Abnormal    Collection Time: 01/02/24  4:08 AM  Result Value Ref Range   Sodium 136 135 - 145 mmol/L   Potassium 4.1 3.5 - 5.1 mmol/L   Chloride 107 98 - 111 mmol/L   CO2 20 (L) 22 - 32 mmol/L   Glucose, Bld 134 (H) 70 - 99 mg/dL    Comment: Glucose reference range applies only to samples taken after fasting for at least 8 hours.   BUN 40 (H) 8 - 23 mg/dL   Creatinine, Ser 8.05 (H) 0.61 - 1.24 mg/dL   Calcium  8.3 (L) 8.9 - 10.3 mg/dL   GFR, Estimated 35 (L) >60 mL/min    Comment: (NOTE) Calculated using the CKD-EPI Creatinine Equation (2021)    Anion gap 9 5 - 15    Comment: Performed at [facility], [address] [place], [place] [address]  Magnesium      Status: None   Collection Time: 01/02/24  4:08 AM  Result Value Ref Range   Magnesium  2.2 1.7 - 2.4 mg/dL    Comment: Performed at [facility], [address] [place], [place] [address]  TSH     Status: None   Collection Time: 01/02/24  4:08 AM  Result Value Ref Range   TSH 1.454 0.350 - 4.500 uIU/mL    Comment: Performed by a 3rd Generation assay with a functional sensitivity of <=0.01 uIU/mL. Performed at [facility], [address] [place], [place] [address]  Hepatic function panel     Status: Abnormal   Collection Time: 01/02/24  4:08 AM  Result Value Ref Range   Total Protein 5.8 (L) 6.5 - 8.1 g/dL   Albumin  3.2 (L) 3.5 - 5.0 g/dL   AST 19 15 - 41 U/L   ALT 8 0 - 44 U/L   Alkaline Phosphatase 47 38 - 126 U/L   Total Bilirubin 0.8 0.0 - 1.2 mg/dL   Bilirubin, Direct 0.2 0.0 - 0.2 mg/dL   Indirect Bilirubin 0.6 0.3 - 0.9 mg/dL    Comment: Performed at [facility], [address], [place], [place] [address]  CBC     Status: Abnormal   Collection Time: 01/03/24  3:09 AM  Result Value Ref Range   WBC 8.4 4.0 - 10.5 K/uL   RBC 3.22 (L) 4.22 - 5.81 MIL/uL   Hemoglobin 10.0 (L) 13.0 - 17.0 g/dL   HCT 68.3 (L) 60.9 - 47.9 %   MCV 98.1 80.0 - 100.0 fL    MCH 31.1 26.0 - 34.0 pg   MCHC 31.6 30.0 - 36.0 g/dL   RDW 85.5 88.4 - 84.4 %   Platelets 207 150 - 400 K/uL   nRBC 0.0 0.0 - 0.2 %    Comment: Performed at [facility], [address] [place], [place] [address]  Basic metabolic panel     Status: Abnormal   Collection Time: 01/03/24  3:09 AM  Result Value Ref Range   Sodium 136 135 - 145 mmol/L   Potassium 3.7 3.5 - 5.1 mmol/L   Chloride 107 98 - 111 mmol/L   CO2 21 (L) 22 - 32 mmol/L   Glucose, Bld 120 (H) 70 - 99 mg/dL    Comment: Glucose reference range applies only to samples taken after fasting for at least 8 hours.   BUN 41 (H) 8 - 23 mg/dL   Creatinine, Ser 8.31 (H) 0.61 - 1.24 mg/dL   Calcium  8.2 (L) 8.9 - 10.3 mg/dL   GFR, Estimated 42 (L) >60 mL/min    Comment: (NOTE) Calculated using the CKD-EPI Creatinine Equation (2021)    Anion gap 8 5 - 15    Comment: Performed at [facility], [address] [place], [place] [address]    Imaging / Studies: ECHOCARDIOGRAM COMPLETE Result Date: 01/01/2024    ECHOCARDIOGRAM REPORT   Patient Name:   JEREMI LOSITO Date of Exam: 01/01/2024 Medical Rec #:  [id number]       Height:       69.0 in Accession #:    [id number]      Weight:       181.0 lb Date of Birth:  [birth year]      BSA:          1.980 m Patient Age:    77 years        BP:           115/72 mmHg Patient Gender: M               HR:           89 bpm. Exam Location:  Inpatient Procedure: 2D Echo, Cardiac Doppler and Color Doppler Indications:    CAD native vessel, A-Fib  History:        Patient has prior history of Echocardiogram examinations, most                 recent 11/11/2020. CAD, Prior CABG; Risk Factors:Hypertension  and Dyslipidemia.  Sonographer:    Juanita Shaw Referring Phys: [id number] DAVID MANUEL ORTIZ IMPRESSIONS  1. Left ventricular ejection fraction, by estimation, is 60 to 65%. The left ventricle has normal function. Left ventricular endocardial border not  optimally defined to evaluate regional wall motion. Left ventricular diastolic parameters are indeterminate.  2. Right ventricular systolic function was not well visualized. The right ventricular size is not well visualized.  3. The mitral valve is normal in structure. No evidence of mitral valve regurgitation. No evidence of mitral stenosis.  4. The aortic valve is tricuspid. Aortic valve regurgitation is not visualized. No aortic stenosis is present.  5. Aortic dilatation noted. There is mild dilatation of the ascending aorta, measuring 37 mm. FINDINGS  Left Ventricle: Left ventricular ejection fraction, by estimation, is 60 to 65%. The left ventricle has normal function. Left ventricular endocardial border not optimally defined to evaluate regional wall motion. The left ventricular internal cavity size was normal in size. There is no left ventricular hypertrophy. Left ventricular diastolic parameters are indeterminate. Right Ventricle: The right ventricular size is not well visualized. Right vetricular wall thickness was not well visualized. Right ventricular systolic function was not well visualized. Left Atrium: Left atrial size was not well visualized. Right Atrium: Right atrial size was not well visualized. Pericardium: There is no evidence of pericardial effusion. Mitral Valve: The mitral valve is normal in structure. No evidence of mitral valve regurgitation. No evidence of mitral valve stenosis. MV peak gradient, 2.6 mmHg. The mean mitral valve gradient is 1.0 mmHg. Tricuspid Valve: The tricuspid valve is not well visualized. Tricuspid valve regurgitation is trivial. No evidence of tricuspid stenosis. Aortic Valve: The aortic valve is tricuspid. Aortic valve regurgitation is not visualized. No aortic stenosis is present. Aortic valve mean gradient measures 3.0 mmHg. Aortic valve peak gradient measures 4.9 mmHg. Aortic valve area, by VTI measures 2.51 cm. Pulmonic Valve: The pulmonic valve was not well  visualized. Pulmonic valve regurgitation is not visualized. No evidence of pulmonic stenosis. Aorta: The aortic root is normal in size and structure and aortic dilatation noted. There is mild dilatation of the ascending aorta, measuring 37 mm. IAS/Shunts: The interatrial septum was not well visualized.  LEFT VENTRICLE PLAX 2D LVIDd:         3.80 cm      Diastology LVIDs:         2.60 cm      LV e' medial:   9.25 cm/s LV PW:         0.70 cm      LV E/e' medial: 7.1 LV IVS:        0.80 cm LVOT diam:     2.00 cm LV SV:         39 LV SV Index:   20 LVOT Area:     3.14 cm  LV Volumes (MOD) LV vol d, MOD A2C: 69.6 ml LV vol d, MOD A4C: 106.0 ml LV vol s, MOD A2C: 19.4 ml LV vol s, MOD A4C: 46.3 ml LV SV MOD A2C:     50.2 ml LV SV MOD A4C:     106.0 ml LV SV MOD BP:      58.7 ml RIGHT VENTRICLE RV Basal diam:  3.30 cm RV Mid diam:    2.00 cm RV S prime:     9.57 cm/s LEFT ATRIUM           Index        RIGHT ATRIUM  Index LA diam:      3.00 cm 1.52 cm/m   RA Area:     8.86 cm LA Vol (A2C): 17.0 ml 8.59 ml/m   RA Volume:   15.60 ml 7.88 ml/m LA Vol (A4C): 31.5 ml 15.91 ml/m  AORTIC VALVE                    PULMONIC VALVE AV Area (Vmax):    2.27 cm     PV Vmax:       0.84 m/s AV Area (Vmean):   1.99 cm     PV Peak grad:  2.8 mmHg AV Area (VTI):     2.51 cm AV Vmax:           110.93 cm/s AV Vmean:          74.767 cm/s AV VTI:            0.154 m AV Peak Grad:      4.9 mmHg AV Mean Grad:      3.0 mmHg LVOT Vmax:         80.00 cm/s LVOT Vmean:        47.300 cm/s LVOT VTI:          0.123 m LVOT/AV VTI ratio: 0.80  AORTA Ao Root diam: 2.70 cm Ao Asc diam:  3.70 cm MITRAL VALVE MV Area (PHT): 4.06 cm    SHUNTS MV Area VTI:   2.41 cm    Systemic VTI:  0.12 m MV Peak grad:  2.6 mmHg    Systemic Diam: 2.00 cm MV Mean grad:  1.0 mmHg MV Vmax:       0.80 m/s MV Vmean:      31.5 cm/s MV Decel Time: 187 msec MV E velocity: 65.35 cm/s Dorn Ross MD Electronically signed by Dorn Ross MD Signature Date/Time:  01/01/2024/1:49:27 PM    Final    DG Chest Port 1 View Result Date: 01/01/2024 CLINICAL DATA:  Dyspnea EXAM: PORTABLE CHEST 1 VIEW COMPARISON:  Chest radiograph dated 08/09/2022. FINDINGS: An enteric tube terminates in the stomach. A right internal jugular central venous port catheter tip overlies the right atrium. The heart size and mediastinal contours are within normal limits. Vascular calcifications are seen in the aortic arch. There is mild left basilar atelectasis/airspace disease. A small left pleural effusion may contribute. There is minimal right basilar atelectasis. Fixation hardware overlies the cervical spine. Degenerative changes are seen in the spine. IMPRESSION: Mild left basilar atelectasis/airspace disease. A small left pleural effusion may contribute. Electronically Signed   By: [place] Hopper M.D.   On: 01/01/2024 09:24   DG Abd 1 View Result Date: 01/01/2024 CLINICAL DATA:  Enteric tube placement. EXAM: ABDOMEN - 1 VIEW COMPARISON:  CT abdomen and pelvis dated 08/09/2022. FINDINGS: An enteric tube terminates in the stomach. IMPRESSION: Enteric tube terminates in the stomach. Electronically Signed   By: [place] Hopper M.D.   On: 01/01/2024 09:23    Medications / Allergies: per chart  Antibiotics: Anti-infectives (From admission, onward)    Start     Dose/Rate Route Frequency Ordered Stop   12/29/23 1600  ceFAZolin  (ANCEF ) IVPB 2g/100 mL premix        2 g 200 mL/hr over 30 Minutes Intravenous Every 8 hours 12/29/23 1401 12/30/23 0128   12/29/23 1600  metroNIDAZOLE  (FLAGYL ) IVPB 500 mg        500 mg 100 mL/hr over 60 Minutes Intravenous Every 8 hours 12/29/23 1401 12/30/23 0844  12/29/23 0600  ceFAZolin  (ANCEF ) IVPB 2g/100 mL premix       Placed in And Linked Group   2 g 200 mL/hr over 30 Minutes Intravenous On call to O.R. 12/29/23 9470 12/29/23 0734   12/29/23 0600  metroNIDAZOLE  (FLAGYL ) IVPB 500 mg       Placed in And Linked Group   500 mg 100 mL/hr over 60 Minutes  Intravenous On call to O.R. 12/29/23 0529 12/29/23 0746         Note: Portions of this report may have been transcribed using voice recognition software. Every effort was made to ensure accuracy; however, inadvertent computerized transcription errors may be present.   Any transcriptional errors that result from this process are unintentional.    Elspeth KYM Schultze, MD, FACS, MASCRS Esophageal, Gastrointestinal & Colorectal Surgery Robotic and Minimally Invasive Surgery  [facility] [address], [address] [place], [place] [address] [phone] Fax [phone] Main  CONTACT INFORMATION: Weekday (9AM-5PM): Call [facility] main office at [phone] Weeknight (5PM-9AM) or Weekend/Holiday: Check EPIC Web Links tab & use AMION (password  [email or web address]) for General Surgery [facility] coverage  Please, DO NOT use SecureChat  (it is not reliable communication to reach operating surgeons & will lead to a delay in care).   Epic staff messaging available for outptient concerns needing 1-2 business day response.      78/06/2024  7:10 AM

## 2024-01-03 NOTE — Progress Notes (Signed)
 Patient Name: Hunter [name] Date of Encounter: 01/03/2024 [facility] Cardiologist: Jerel Balding, MD   Interval Summary  .    Feeling better. Had two small bowel movements.  Denies palpitations.    Vital Signs .    Vitals:   01/03/24 0835 01/03/24 0844 01/03/24 0900 01/03/24 1000  BP:  114/60 112/78 107/63  Pulse: (!) 103 (!) 119 67 (!) 104  Resp: 20  16 14   Temp:   97.9 F (36.6 C)   TempSrc:   Oral   SpO2: 95%  98% 98%  Weight:      Height:        Intake/Output Summary (Last 24 hours) at 01/03/2024 1215 Last data filed at 01/03/2024 1129 Gross per 24 hour  Intake 470.89 ml  Output 1236 ml  Net -765.11 ml      12/29/2023    5:30 AM 12/14/2023   10:58 AM 09/05/2023   10:27 AM  Last 3 Weights  Weight (lbs) 181 lb 181 lb 181 lb 6.4 oz  Weight (kg) 82.101 kg 82.101 kg 82.283 kg      Telemetry/ECG    Atrial fibrillation. Rate 90s-110s.  - Personally Reviewed  Physical Exam .    VS:  BP 107/63   Pulse (!) 104   Temp 97.9 F (36.6 C) (Oral)   Resp 14   Ht 5' 9 (1.753 m)   Wt 82.1 kg   SpO2 98%   BMI 26.73 kg/m  , BMI Body mass index is 26.73 kg/m. GENERAL:  Well appearing HEENT: Pupils equal round and reactive, fundi not visualized, oral mucosa unremarkable.  NG tube in place.  NECK:  No jugular venous distention, waveform within normal limits, carotid upstroke brisk and symmetric, no bruits, no thyromegaly LUNGS:  Clear to auscultation bilaterally HEART:  Tachycardic.  Irregularly iregular.  PMI not displaced or sustained,S1 and S2 within normal limits, no S3, no S4, no clicks, no rubs, no murmurs ABD:  Flat, positive bowel sounds normal in frequency in pitch, no bruits, no rebound, no guarding, no midline pulsatile mass, no hepatomegaly, no splenomegaly EXT:  2 plus pulses throughout, no edema, no cyanosis no clubbing SKIN:  No rashes no nodules NEURO:  Cranial nerves II through XII grossly intact, motor grossly intact throughout PSYCH:   Cognitively intact, oriented to person place and time  Assessment & Plan .     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  Postoperative ileus requiring NG tube placement.  Cardiology consulted for atrial fibrillation with RVR.  # Atrial fibrillation with RVR:  Transitioned from IV diltiazem  to metoprolol .  Will increase the dose to 75mg  q12h for improved rate control.   He is asymptomatic.  Recommend starting Eliquis  once stable per surgical team.    This patients CHA2DS2-VASc Score and unadjusted Ischemic Stroke Rate (% per year) is equal to 4.8 % stroke rate/year from a score of 4  Above score calculated as 1 point each if present [CHF, HTN, DM, Vascular=MI/PAD/Aortic Plaque, Age if 65-74, or Male] Above score calculated as 2 points each if present [Age > 75, or Stroke/TIA/TE]  # CAD s/p CABG:  # Hyperlipidemia:  Not an active issue.  Resume simvastatin  and aspirin  once tolerating oral medications.   For questions or updates, please contact [facility] Please consult [email or web address] for contact info under  Signed, Annabella Scarce, MD

## 2024-01-03 NOTE — Progress Notes (Signed)
  Progress Note   Patient: Hunter [name] FMW:[id number] DOB: [birth year] DOA: 12/29/2023     5 DOS: the patient was seen and examined on 01/03/2024   Brief hospital course: 78 year old man PMH including PAF perioperative and during hospitalizations, CAD status post CABG, NSVT, status post cystoprostatectomy with ileal, incisional and parastomal ileal conduit hernia repairs, on attending service of [facility], who developed postoperative atrial fibrillation with RVR, TRH was consulted to assist with medical management along with cardiology.  Attending [facility]  Consultants [facility]   Procedures/Events 1/2 ROBOTIC TRANSVERSUS ABDOMINIS RELEASE (TAR) COMPONENT SEPARATION - UNILATERAL ROBOTIC REPAIR OF ABDOMINAL HERNIA WITH MESH  ROBOTIC PARASTOMAL HERNIA REPAIR OF ILEAL CONDUIT WITH MESH (SUGARBAKER) ROBOTIC LYSIS OD ADHESIONS COLON REPAIR  Assessment and Plan: AKI  CKD stage 3b Baseline around 1.6-1.8.  Peak 2.14. Continues to improve.  Now close to baseline if not at baseline. Clear liquids.  Monitor.   Acute hypoxia without specified respiratory failure Resolved.   Paroxysmal atrial fibrillation CHA?DS?-VASc Score of 5.  TSH within normal limits. Echo showed normal LVEF.  No significant valvular abnormalities noted. Heart rate low 100s.  Asymptomatic.  Defer management of this to cardiology.  Defer anticoagulation to cardiology and surgery.   NSVT  None recently. Defer management to cardiology   Essential hypertension Stable.   CAD s/p CABG S/P CABG x 4, 12/12/12, LIMA-LAD;VG-OM1,OM2;VG-PD Management per cardiology   Normocytic anemia  Probable acute blood loss anemia perioperatively. Hemoglobin stable   Protein-calorie malnutrition, severe (HCC)  In the setting of postop period with Hx of anemia, and malignancy. Currently NPO and has a NGT in place.   History of poliomyelitis 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   GOUT Resume allopurinol  once cleared for  oral intake.   GERD PPI       Subjective:  Feels better  Physical Exam: Vitals:   01/03/24 1300 01/03/24 1400 01/03/24 1500 01/03/24 1600  BP: 111/77 109/64 114/79   Pulse: (!) 105 (!) 102 99   Resp: 19 (!) 21 (!) 25 20  Temp:    97.8 F (36.6 C)  TempSrc:    Oral  SpO2: 97% 97% 98% 97%  Weight:      Height:       Physical Exam Vitals reviewed.  Constitutional:      General: He is not in acute distress.    Appearance: He is not ill-appearing or toxic-appearing.  Cardiovascular:     Rate and Rhythm: Tachycardia present. Rhythm irregular.     Heart sounds: No murmur heard. Pulmonary:     Effort: Pulmonary effort is normal. No respiratory distress.     Breath sounds: No wheezing, rhonchi or rales.  Neurological:     Mental Status: He is alert.  Psychiatric:        Behavior: Behavior normal.     Data Reviewed: Creatinine down to 1.68.  Potassium within normal limits.  Hemoglobin stable at 10.0.  Family Communication: wife at bedside  Disposition: Status is: Inpatient Remains inpatient appropriate because: s/p surgery     Time spent: 20 minutes  Author: Toribio Door, MD 01/03/2024 5:25 PM  For on call review [email or web address].

## 2024-01-03 NOTE — Plan of Care (Signed)
  Problem: Clinical Measurements: Goal: Will remain free from infection Outcome: Progressing Goal: Respiratory complications will improve Outcome: Progressing Goal: Cardiovascular complication will be avoided Outcome: Progressing   Problem: Coping: Goal: Level of anxiety will decrease Outcome: Progressing

## 2024-01-04 ENCOUNTER — Other Ambulatory Visit: Payer: Self-pay

## 2024-01-04 ENCOUNTER — Inpatient Hospital Stay (HOSPITAL_COMMUNITY): Payer: Medicare Other

## 2024-01-04 DIAGNOSIS — I4729 Other ventricular tachycardia: Secondary | ICD-10-CM

## 2024-01-04 DIAGNOSIS — Z951 Presence of aortocoronary bypass graft: Secondary | ICD-10-CM

## 2024-01-04 DIAGNOSIS — I10 Essential (primary) hypertension: Secondary | ICD-10-CM

## 2024-01-04 DIAGNOSIS — E43 Unspecified severe protein-calorie malnutrition: Secondary | ICD-10-CM

## 2024-01-04 DIAGNOSIS — K430 Incisional hernia with obstruction, without gangrene: Secondary | ICD-10-CM | POA: Diagnosis not present

## 2024-01-04 DIAGNOSIS — I480 Paroxysmal atrial fibrillation: Secondary | ICD-10-CM | POA: Diagnosis not present

## 2024-01-04 DIAGNOSIS — G9341 Metabolic encephalopathy: Secondary | ICD-10-CM | POA: Diagnosis not present

## 2024-01-04 DIAGNOSIS — K435 Parastomal hernia without obstruction or  gangrene: Secondary | ICD-10-CM

## 2024-01-04 DIAGNOSIS — K567 Ileus, unspecified: Secondary | ICD-10-CM | POA: Insufficient documentation

## 2024-01-04 DIAGNOSIS — N178 Other acute kidney failure: Secondary | ICD-10-CM

## 2024-01-04 LAB — MAGNESIUM: Magnesium: 2.5 mg/dL — ABNORMAL HIGH (ref 1.7–2.4)

## 2024-01-04 LAB — BASIC METABOLIC PANEL
Anion gap: 11 (ref 5–15)
BUN: 53 mg/dL — ABNORMAL HIGH (ref 8–23)
CO2: 21 mmol/L — ABNORMAL LOW (ref 22–32)
Calcium: 8.1 mg/dL — ABNORMAL LOW (ref 8.9–10.3)
Chloride: 108 mmol/L (ref 98–111)
Creatinine, Ser: 1.69 mg/dL — ABNORMAL HIGH (ref 0.61–1.24)
GFR, Estimated: 41 mL/min — ABNORMAL LOW (ref >60)
Glucose, Bld: 141 mg/dL — ABNORMAL HIGH (ref 70–99)
Potassium: 3.6 mmol/L (ref 3.5–5.1)
Sodium: 140 mmol/L (ref 135–145)

## 2024-01-04 LAB — HEPARIN LEVEL (UNFRACTIONATED)
Heparin Unfractionated: >1.1 [IU]/mL — ABNORMAL HIGH (ref 0.30–0.70)
Heparin Unfractionated: >1.1 [IU]/mL — ABNORMAL HIGH (ref 0.30–0.70)

## 2024-01-04 LAB — PHOSPHORUS: Phosphorus: 3.1 mg/dL (ref 2.5–4.6)

## 2024-01-04 MED ORDER — FAMOTIDINE IN NACL 20-0.9 MG/50ML-% IV SOLN: 20.0000 mg | Freq: Two times a day (BID) | INTRAVENOUS | Status: DC

## 2024-01-04 MED ORDER — SODIUM CHLORIDE 0.9 % IV SOLN: 25.0000 mg | Freq: Four times a day (QID) | INTRAVENOUS | Status: DC | PRN

## 2024-01-04 MED ORDER — HEPARIN (PORCINE) 25000 UT/250ML-% IV SOLN
1000.0000 [IU]/h | INTRAVENOUS | Status: DC
  Administered 2024-01-04: 900 [IU]/h via INTRAVENOUS
  Filled 2024-01-04: qty 250

## 2024-01-04 MED ORDER — SODIUM CHLORIDE 0.9 % IV SOLN
10.0000 mg | Freq: Two times a day (BID) | INTRAVENOUS | Status: AC
  Administered 2024-01-04: 10 mg via INTRAVENOUS
  Filled 2024-01-04: qty 1

## 2024-01-04 MED ORDER — THIAMINE HCL 100 MG/ML IJ SOLN
100.0000 mg | INTRAMUSCULAR | Status: DC
  Administered 2024-01-05: 100 mg via INTRAVENOUS
  Filled 2024-01-04: qty 2

## 2024-01-04 MED ORDER — DIGOXIN 0.25 MG/ML IJ SOLN
0.1250 mg | Freq: Three times a day (TID) | INTRAMUSCULAR | Status: AC
  Administered 2024-01-04 – 2024-01-05 (×2): 0.125 mg via INTRAVENOUS
  Filled 2024-01-04 (×2): qty 2

## 2024-01-04 MED ORDER — DIGOXIN 0.25 MG/ML IJ SOLN
0.2500 mg | Freq: Once | INTRAMUSCULAR | Status: AC
  Administered 2024-01-04: 0.25 mg via INTRAVENOUS
  Filled 2024-01-04: qty 2

## 2024-01-04 MED ORDER — HEPARIN BOLUS VIA INFUSION
4000.0000 [IU] | Freq: Once | INTRAVENOUS | Status: AC
  Administered 2024-01-04: 4000 [IU] via INTRAVENOUS
  Filled 2024-01-04: qty 4000

## 2024-01-04 MED ORDER — SIMETHICONE 80 MG PO CHEW
40.0000 mg | CHEWABLE_TABLET | Freq: Four times a day (QID) | ORAL | Status: AC
  Administered 2024-01-05 – 2024-01-06 (×7): 40 mg via ORAL
  Filled 2024-01-04 (×7): qty 1

## 2024-01-04 MED ORDER — TRAVASOL 10 % IV SOLN
INTRAVENOUS | Status: AC
  Filled 2024-01-04: qty 528

## 2024-01-04 MED ORDER — LORAZEPAM 2 MG/ML IJ SOLN: 0.5000 mg | Freq: Three times a day (TID) | INTRAMUSCULAR | Status: DC | PRN

## 2024-01-04 MED ORDER — METOPROLOL TARTRATE 5 MG/5ML IV SOLN
5.0000 mg | Freq: Four times a day (QID) | INTRAVENOUS | Status: DC | PRN
  Administered 2024-01-05: 5 mg via INTRAVENOUS
  Filled 2024-01-04: qty 5

## 2024-01-04 MED ORDER — THIAMINE HCL 100 MG/ML IJ SOLN
100.0000 mg | Freq: Once | INTRAMUSCULAR | Status: AC
  Administered 2024-01-04: 100 mg via INTRAVENOUS
  Filled 2024-01-04: qty 2

## 2024-01-04 MED ORDER — HEPARIN (PORCINE) 25000 UT/250ML-% IV SOLN
1150.0000 [IU]/h | INTRAVENOUS | Status: DC
  Administered 2024-01-04: 1150 [IU]/h via INTRAVENOUS
  Filled 2024-01-04: qty 250

## 2024-01-04 MED ORDER — METOPROLOL TARTRATE 5 MG/5ML IV SOLN
5.0000 mg | Freq: Four times a day (QID) | INTRAVENOUS | Status: DC
  Administered 2024-01-04: 5 mg via INTRAVENOUS
  Administered 2024-01-04: 2.5 mg via INTRAVENOUS
  Administered 2024-01-04: 5 mg via INTRAVENOUS
  Administered 2024-01-04: 2.5 mg via INTRAVENOUS
  Administered 2024-01-05: 5 mg via INTRAVENOUS
  Filled 2024-01-04 (×5): qty 5

## 2024-01-04 MED ORDER — INSULIN ASPART 100 UNIT/ML IJ SOLN
0.0000 [IU] | Freq: Four times a day (QID) | INTRAMUSCULAR | Status: DC
  Administered 2024-01-05: 1 [IU] via SUBCUTANEOUS

## 2024-01-04 MED ORDER — POTASSIUM CHLORIDE 10 MEQ/100ML IV SOLN
10.0000 meq | INTRAVENOUS | Status: AC
  Administered 2024-01-04 (×4): 10 meq via INTRAVENOUS
  Filled 2024-01-04 (×4): qty 100

## 2024-01-04 NOTE — Progress Notes (Addendum)
 PROGRESS NOTE   Hunter [name]  FMW:[id number] DOB: July 16, 1946 DOA: 12/29/2023 PCP: Paola Greig CROME, FNP   Date of Service: the patient was seen and examined on 01/04/2024  Brief Narrative:  78 year old man PMH CAD (s/p CABG in 2013 with LIMA-LAD, SVG-OM1-OM2 and SVG-PDA), paroxysmal atrial fibrillation, NSVT, chronic kidney disease stage IIIb, hypertension, hyperlipidemia, gastroesophageal reflux disease, gout who presented to [facility] on 1/2 for robotic assisted ventral incisional abdominal wall hernia and parastomal hernia repair admitted postoperatively to the general surgery service with Dr. Sheldon.    In the days following the surgery the patient began to develop multiple complications including acute kidney injury superimposed upon his chronic kidney disease stage IIIb for which the hospitalist group was consulted as well as developing rapid atrial fibrillation for which cardiology was consulted.   Procedures/Events 1/2 ROBOTIC TRANSVERSUS ABDOMINIS RELEASE (TAR) COMPONENT SEPARATION - UNILATERAL ROBOTIC REPAIR OF ABDOMINAL HERNIA WITH MESH  ROBOTIC PARASTOMAL HERNIA REPAIR OF ILEAL CONDUIT WITH MESH (SUGARBAKER) ROBOTIC LYSIS OD ADHESIONS COLON REPAIR    Assessment & Plan Ileus (HCC) Management per general surgery General Surgery recommending initiation of TPN, PICC line ordered Daily abdominal x-rays Serial abdominal exams Continuing clear liquid diet Acute renal failure superimposed on stage 3b chronic kidney disease (HCC) Baseline creatinine 1.5-1.7 Creatinine now 1.69, down from 2.14 during this hospitalization Continuing to monitor renal function with serial chemistries Paroxysmal atrial fibrillation with rapid ventricular response (HCC) Continued poor rate control despite transition from diltiazem  to scheduled intravenous metoprolol   Cardiology following, recommending low-dose intravenous digoxin  load  Considering elevated CHA2DS2-VASc or patient is now on  heparin  infusion TSH unremarkable.  Echocardiogram reveals preserved ejection fraction with no significant valvular abnormality  Acute metabolic encephalopathy Patient has exhibited bouts of confusion and agitation as of late in the setting of prolonged hospitalization and acute illness Today, symptoms of confusion are extremely minimal Advised against routine use of benzodiazepine use in this patient population as it can have a paradoxical effect in some patients Would first recommend minimizing noxious stimuli by minimizing medical interventions If patient continues to exhibit bouts of confusion would try to get patient to commit to a regular sleep cycle with melatonin nightly Finally, if some degree of chemical sedation must be used then extremely low-dose Haldol can be effective when used sparingly if QTc is acceptable Normocytic anemia No current clinical evidence of bleeding Hemoglobin now stable Considering near 3 g drop in hemoglobin over the past 3 weeks will obtain iron panel, folate, vitamin B12 CAD s/p CABG Holding aspirin  and statin therapy for now, will resume once patient is tolerating oral intake Chest pain-free Monitoring patient on telemetry NSVT (nonsustained ventricular tachycardia), 9 beats 12/23 No recent episodes Hyperlipidemia Will resume statin therapy once tolerating oral intake Incarcerated incisional hernia Status post operative intervention as noted above Postoperative management per general surgery Parastomal hernia of ileal conduit Status post operative intervention as noted above Postoperative management per general surgery Protein-calorie malnutrition, severe (HCC) Due to to continued inability to resume enteral feeding, PICC line order placed for initiation of TPN Pharmacy consult additionally placed for TPN recommendation GOUT Resume Allopurinol  once taking p.o. consistently     Subjective:  Patient currently denying pain.  Patient complains of  feeling tired and needing sleep.  Wife was at bedside states the patient's confusion is much improved compared to yesterday.  Physical Exam:  Vitals:   01/04/24 1600 01/04/24 1644 01/04/24 1800 01/04/24 2000  BP: 102/63  (!) 102/49 139/72  Pulse:  80 84 (!) 55  Resp: 13 18 13 20   Temp:  (!) 97.3 F (36.3 C)  97.7 F (36.5 C)  TempSrc:  Oral  Oral  SpO2:  97% 98% 99%  Weight:      Height:        Constitutional: Awake alert and oriented x3, no associated distress.   Skin: no rashes, no lesions, poor skin turgor noted. Eyes: Pupils are equally reactive to light.  No evidence of scleral icterus or conjunctival pallor.  ENMT: Moist mucous membranes noted.  Posterior pharynx clear of any exudate or lesions.   Respiratory: clear to auscultation bilaterally, no wheezing, no crackles. Normal respiratory effort. No accessory muscle use.  Cardiovascular: Tachycardic rate with irregularly irregular rhythm.  No murmurs / rubs / gallops. No extremity edema. 2+ pedal pulses. No carotid bruits.  Abdomen: Soft abdomen.  Significantly hypoactive bowel sounds.  Ostomy revealing pink mucosa with no evidence of blood. Musculoskeletal: No joint deformity upper and lower extremities. Good ROM, no contractures. Normal muscle tone.    Data Reviewed:  I have personally reviewed and interpreted labs, imaging.  Significant findings are   CBC: Recent Labs  Lab 12/30/23 0557 12/31/23 0425 01/01/24 0402 01/02/24 0408 01/03/24 0309  WBC 11.6* 11.4* 10.9* 8.3 8.4  HGB 11.7* 10.6* 11.5* 9.4* 10.0*  HCT 36.3* 32.1* 35.7* 28.5* 31.6*  MCV 97.8 97.3 97.5 96.9 98.1  PLT 168 153 177 169 207   Basic Metabolic Panel: Recent Labs  Lab 01/01/24 0402 01/01/24 0903 01/02/24 0408 01/03/24 0309 01/04/24 0428 01/04/24 0429  NA  --  134* 136 136  --  140  K 5.0 4.9 4.1 3.7  --  3.6  CL  --  106 107 107  --  108  CO2  --  20* 20* 21*  --  21*  GLUCOSE  --  142* 134* 120*  --  141*  BUN  --  40* 40* 41*   --  53*  CREATININE 2.10* 2.14* 1.94* 1.68*  --  1.69*  CALCIUM   --  8.0* 8.3* 8.2*  --  8.1*  MG 2.1  --  2.2  --  2.5*  --   PHOS  --   --   --   --  3.1  --    GFR: Estimated Creatinine Clearance: 36.6 mL/min (A) (by C-G formula based on SCr of 1.69 mg/dL (H)). Liver Function Tests: Recent Labs  Lab 01/01/24 0903 01/02/24 0408  AST 21 19  ALT 7 8  ALKPHOS 58 47  BILITOT 0.6 0.8  PROT 5.5* 5.8*  ALBUMIN  2.4* 3.2*    Coagulation Profile: Recent Labs  Lab 01/01/24 1302  INR 1.2     Telemetry: Personally reviewed.  Rhythm is rapid atrial fibrillation with heart rate of 120 bpm.  No dynamic ST segment changes appreciated.   Code Status:  Full code.  Code status decision has been confirmed with: patient Family Communication: Plan of care discussed with wife at the bedside   Severity of Illness:  The appropriate patient status for this patient is INPATIENT. Inpatient status is judged to be reasonable and necessary in order to provide the required intensity of service to ensure the patient's safety. The patient's presenting symptoms, physical exam findings, and initial radiographic and laboratory data in the context of their chronic comorbidities is felt to place them at high risk for further clinical deterioration. Furthermore, it is not anticipated that the patient will be medically stable for discharge from the  hospital within 2 midnights of admission.   * I certify that at the point of admission it is my clinical judgment that the patient will require inpatient hospital care spanning beyond 2 midnights from the point of admission due to high intensity of service, high risk for further deterioration and high frequency of surveillance required.*  Time spent:  56 minutes  Author:  Zachary JINNY Ba MD  01/04/2024 9:33 PM

## 2024-01-04 NOTE — Progress Notes (Addendum)
 At bedside for PICC placement. Pt has a R PAC. Attempted PICC placement on LUA x 2 with no success. Very poor vasculature. Basilic vessel constricted and unable to thread the guidewire into the vessel. Second attempt to the cephalic vessel. Able to cannulate the vessel and guidewire, however, resistance met when advancing the PICC line. PICC would not thread beyond the shoulder/clavicular region. R PAC accessed and PIV inserted successfully. Recommend central access for any further vascular needs. RN aware and will alert provider.

## 2024-01-04 NOTE — Progress Notes (Signed)
 Patient Name: Hunter [name] Date of Encounter: 01/04/2024 [facility] Cardiologist: Jerel Balding, MD   Interval Summary  .    Feeling ok.  Oral medications have been discontinued.  Persistent SBO re-confirmed on imaging yesterday.   Vital Signs .    Vitals:   01/04/24 0200 01/04/24 0405 01/04/24 0600 01/04/24 0800  BP: 132/89 127/76 100/60 128/76  Pulse: 98 90 86 76  Resp: 10 18 10 12   Temp:    98 F (36.7 C)  TempSrc:    Axillary  SpO2: 100% 100% 100% 100%  Weight:      Height:        Intake/Output Summary (Last 24 hours) at 01/04/2024 0955 Last data filed at 01/04/2024 0949 Gross per 24 hour  Intake 571 ml  Output 2745 ml  Net -2174 ml      12/29/2023    5:30 AM 12/14/2023   10:58 AM 09/05/2023   10:27 AM  Last 3 Weights  Weight (lbs) 181 lb 181 lb 181 lb 6.4 oz  Weight (kg) 82.101 kg 82.101 kg 82.283 kg      Telemetry/ECG    Atrial fibrillation. Rate 90s-110s.  - Personally Reviewed  Physical Exam .    VS:  BP 128/76 (BP Location: Left Arm)   Pulse 76   Temp 98 F (36.7 C) (Axillary)   Resp 12   Ht 5' 9 (1.753 m)   Wt 82.1 kg   SpO2 100%   BMI 26.73 kg/m  , BMI Body mass index is 26.73 kg/m. GENERAL: Ill-appearing HEENT: Pupils equal round and reactive, fundi not visualized, oral mucosa unremarkable.  NG tube in place.  NECK:  No jugular venous distention, waveform within normal limits, carotid upstroke brisk and symmetric, no bruits, no thyromegaly LUNGS:  Clear to auscultation bilaterally HEART:  Tachycardic.  Irregularly iregular.  PMI not displaced or sustained,S1 and S2 within normal limits, no S3, no S4, no clicks, no rubs, no murmurs ABD:  Quiet bowel sounds EXT:  2 plus pulses throughout, no edema, no cyanosis no clubbing SKIN:  No rashes no nodules NEURO:  Cranial nerves II through XII grossly intact, motor grossly intact throughout PSYCH:  Cognitively intact, oriented to person place and time  Assessment & Plan .     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  Postoperative ileus requiring NG tube placement.  Cardiology consulted for atrial fibrillation with RVR.  # Atrial fibrillation with RVR:  Transitioned from IV diltiazem  to metoprolol .  Rates were in the 90s-110s yesterday.  Today rates are more elevated 90s-130s.  There is concern that he isn't absorbing oral medications due to his persistent SBO.  Agree with switching back to IV agents.  Unfortunately IV metoprolol  will not last long enough to consistently well-controlled his rates.  Blood pressures are too low to start back diltiazem  and it was not particularly effective to begin with.  Will start digoxin  0.25 mg IV x 1 then 0.125 mg IV every 8 hours for 2 doses.  Will check a digoxin  level in a few days if we need to continue using this medication.  Avoid amiodarone  if possible given that he has not been anticoagulated.  Agree with IV heparin .  Recommend switching to Eliquis  once stable per surgical team.    This patients CHA2DS2-VASc Score and unadjusted Ischemic Stroke Rate (%  per year) is equal to 4.8 % stroke rate/year from a score of 4  Above score calculated as 1 point each if present [CHF, HTN, DM, Vascular=MI/PAD/Aortic Plaque, Age if 65-74, or Male] Above score calculated as 2 points each if present [Age > 75, or Stroke/TIA/TE]  # CAD s/p CABG:  # Hyperlipidemia:  Not an active issue.  Resume simvastatin  and aspirin  once tolerating oral medications.    For questions or updates, please contact [facility] Please consult [email or web address] for contact info under        Signed, Annabella Scarce, MD

## 2024-01-04 NOTE — Assessment & Plan Note (Signed)
 Holding aspirin and statin therapy for now, will resume once patient is tolerating oral intake Chest pain-free Monitoring patient on telemetry

## 2024-01-04 NOTE — Progress Notes (Addendum)
 01/04/2024  Hunter [name] [id number] 07/09/1946  CARE TEAM: PCP: Paola Greig CROME, FNP  Outpatient Care Team: Patient Care Team: Paola Greig CROME, FNP as PCP - General (Family Medicine) Croitoru, Jerel, MD as PCP - Cardiology (Cardiology) Croitoru, Jerel, MD as Attending Physician (Cardiology) Mayo, Darice LABOR, FNP as WOC Nurse (Nurse Practitioner) Rogers Hai, MD as Medical Oncologist (Medical Oncology) Alvaro Ricardo KATHEE Raddle., MD as Consulting Physician (Urology) Colon Shove, MD as Consulting Physician (Neurosurgery) Sheldon Standing, MD as Consulting Physician (General Surgery)  Inpatient Treatment Team: Treatment Team:  Kenard Zachary PARAS, MD Lbcardiology, Rounding, MD Jadine Toribio SQUIBB, MD Aurelia Jeffie DASEN, RN Bobbette Life, MD Apickup-Ot, LABOR, OT Hillery Nest, OT Sheldon Standing, MD   Problem List:   Principal Problem:   Incarcerated incisional hernia Active Problems:   Hyperlipidemia   GOUT   MONONEURITIS OF UNSPECIFIED SITE   Essential hypertension   GERD   CAD s/p CABG   S/P CABG x 4, 12/12/12, LIMA-LAD;VG-OM1,OM2;VG-PD   Paroxysmal atrial fibrillation (HCC)   NSVT (nonsustained ventricular tachycardia), 9 beats 12/23   Port-A-Cath in place   Parastomal hernia of ileal conduit   History of poliomyelitis   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   CKD stage 3b, GFR 30-44 ml/min (HCC)   AKI (acute kidney injury) (HCC)   Normocytic anemia   Protein-calorie malnutrition, severe (HCC)   12/29/2023  POST-OPERATIVE DIAGNOSIS:   Ventral incisional abdominal wall hernias 13x5cm region - incarcerated Parastomal ileal conduit hernia - incarcerated    PROCEDURE:   ROBOTIC TRANSVERSUS ABDOMINIS RELEASE (TAR) COMPONENT SEPARATION - UNILATERAL ROBOTIC REPAIR OF ABDOMINAL HERNIA WITH MESH  ROBOTIC PARASTOMAL HERNIA REPAIR OF ILEAL CONDUIT WITH MESH (SUGARBAKER) ROBOTIC LYSIS OD ADHESIONS COLON REPAIR TAP BLOCK - BILATERAL   SURGEON:  Standing KYM Sheldon, MD  OR FINDINGS: Parastomal hernia around ileal conduit incarcerated with omentum.  5 x 4 cm region.  Primarily closed.   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  13 x 5 cm region.  Right sided component separation TAR release done to allow largest 8x5cm defect to be primarily closed   Type of ventral wall repair:  Laparoscopic underlay repair .with Primary repair of largest hernia Placement of mesh: Retrorectus / Preperitoneal underlay repair Name of mesh: Bard Ventralight dual sided (polypropylene / Seprafilm) Size of mesh: 33x27cm Orientation:  Mostly vertical with slightly oblique to right lower quadrant Mesh overlap:  5-7cm   Assessment [facility] Stay = 6 days) 6 Days Post-Op    A-fib rate persistent - rate  Ileus appearing to resolve clinically but now with increased NG tube output.  Plan:  Having some bowel movements with contrast in colon on 8 hour SBO protocol film which argues that his ileus questionable partial obstruction has resolved.  However he suddenly had a large volume out of his NG tube through the night.  Nursing and wife hesitant to remove the tube just yet.  Follow with daily films and exam.  Will retry clamping trials and regroup.  His ileus is not consistently resolving.  He needs nutrition.  Will start TPN.  Paroxysmal atrial fibrillation.  Most likely due to ileus and inability to absorb beta-blockers, withdrawing with from his home regimen = recurrence.  Other specialties seem to feel like the order will work but he is not well rate controlled this morning and his NG tube is very high on output, so again I do not trust his gut.  I wrote for scheduled IV metoprolol   with breakthrough and see what they think since diltiazem  does not seem to work as well and causes more hypotension.   Cardiology recommends full anticoagulation given recurrent episodes of A-fib.  He still has A-fib but not well-controlled.  I am starting the  heparin  drip since they seem to want a wait until he could just get on Eliquis .  AKI in the setting of chronic kidney disease (creatinine baseline 1.5-1.8) from prior cystoprostatectomy and uretero-ileal urostomy conduit.  Cr peak of 2.1 coming down to baseline range of 1.68.  He is nonoliguric and that seems to be stabilizing.  Keeping on the dry side for now.  Try to avoid dehydration with PRN (as needed) backups.  Follow electrolytes.  Keep potassium above 4 and magnesium  above 2.    Still with some occasional hiccups according to nurse and wife but seems less today.  Do not trust enteral route.  Will switch from Compazine  to Thorazine PRN (as needed)  No evidence of peritonitis and I doubt any active infection at this time.  Minimal soreness.  If does not get better in 48 hours may do CT scan to rule out abscess or collection.  I think he is starting a little confused delirious with some sundowning.  Will offer some low-dose lorazepam  since he is in the stepdown unit to help him out.  History of polio with chronic left upper extremity weakness.  Right upper extremity weakness postop solved.  Same with edema.  Have therapies to be involved and follow.  He gives me no other signs of neurological decline so I do not think he is having a stroke or other issue.  Follow closely.  Should improve.  History of gout.  Allopurinol  once ileus resolved.  History of allergies.  Continue Flonase   History of hypertension.  I do not trust an enteral route.  Would give all medications through IV.  VTE prophylaxis- SCDs.  Enoxaparin .  Okay to do heparin  drip as needed given atrial fibrillation.  -mobilize as tolerated to help recovery   -Disposition:  Disposition: TBD T    I reviewed nursing notes, last 24 h vitals and pain scores, last 48 h intake and output, last 24 h labs and trends, and last 24 h imaging results.  I have reviewed this patient's available data, including medical history, events  of note, test results, etc as part of my evaluation.   A significant portion of that time was spent in counseling. Care during the described time interval was provided by me.  This care required moderate level of medical decision making.  01/04/2024    Subjective: (Chief complaint)  Patient a little confused.  Joking and smiling.  Wants to fall asleep.  Denies any abdominal pain.  Not asking for pain or nausea meds.  Still with some occasional hiccuping.  Had 3 small bowel movements.  Stepdown/ICU nursing and wife in room.  They all note NG tube output suddenly was 1400 the last shift.  Patient has maybe drank 400 mL and they have been diligent with strict I&O.  Objective:  Vital signs:  Vitals:   01/04/24 0100 01/04/24 0200 01/04/24 0405 01/04/24 0600  BP: 124/81 132/89 127/76 100/60  Pulse: 91 98 90 86  Resp: 16 10 18 10   Temp:      TempSrc:      SpO2: 92% 100% 100% 100%  Weight:      Height:        Last BM Date : 01/03/24  Intake/Output  Yesterday:  01/07 0701 - 01/08 0700 In: 770 [P.O.:640; NG/GT:130] Out: 3051 [Urine:1500; Emesis/NG output:1551] This shift:  No intake/output data recorded.  Bowel function:  Flatus:  scant  BM:  YES  Drain: NG tube:  Very thin with light green bile tinge   Physical Exam:  General: Pt awake/alert in no acute distress.  Seems less confused/agitated.  Still oriented not toxic/sickly. Eyes: PERRL, normal EOM.  Sclera clear.  No icterus Neuro: CN II-XII intact w/o focal sensory/motor deficits.  Chronic left upper extremity weakness this is baseline.  Right upper extremity with better active range of motion.  No problem with passive motion though.  Good handgrip. Lymph: No head/neck/groin lymphadenopathy Psych:  No delerium/psychosis/paranoia.  Oriented x 4 HENT: Normocephalic, Mucus membranes moist.  No thrush Neck: Supple, No tracheal deviation.  No obvious thyromegaly Chest: No pain to chest wall compression.  Good  respiratory excursion.  No audible wheezing CV:  Pulses intact.  Regular rhythm.  No major extremity edema MS: Normal AROM mjr joints.  No obvious deformity  Abdomen: Soft.  Mildy distended.  Incisions clean dry and intact.  No cellulitis.  Nontender.  No guarding to cough or bed shake.  No rebound or percussive tenderness.  No evidence of peritonitis.  Right lower quadrant ileal conduit urostomy pink with clear light yellow urine in bag  Ext:   No deformity.  Edema resolved.  No cyanosis Skin: No petechiae / purpurea.  No major sores.  Warm and dry    Results:   Cultures: Recent Results (from the past 720 hours)  MRSA Next Gen by PCR, Nasal     Status: None   Collection Time: 01/03/24 11:39 AM   Specimen: Nasal Mucosa; Nasal Swab  Result Value Ref Range Status   MRSA by PCR Next Gen NOT DETECTED NOT DETECTED Final    Comment: (NOTE) The GeneXpert MRSA Assay (FDA approved for NASAL specimens only), is one component of a comprehensive MRSA colonization surveillance program. It is not intended to diagnose MRSA infection nor to guide or monitor treatment for MRSA infections. Test performance is not FDA approved in patients less than 25 years old. Performed at [facility], [address] [place], [place] [address]     Labs: Results for orders placed or performed during the hospital encounter of 12/29/23 (from the past 48 hours)  CBC     Status: Abnormal   Collection Time: 01/03/24  3:09 AM  Result Value Ref Range   WBC 8.4 4.0 - 10.5 K/uL   RBC 3.22 (L) 4.22 - 5.81 MIL/uL   Hemoglobin 10.0 (L) 13.0 - 17.0 g/dL   HCT 68.3 (L) 60.9 - 47.9 %   MCV 98.1 80.0 - 100.0 fL   MCH 31.1 26.0 - 34.0 pg   MCHC 31.6 30.0 - 36.0 g/dL   RDW 85.5 88.4 - 84.4 %   Platelets 207 150 - 400 K/uL   nRBC 0.0 0.0 - 0.2 %    Comment: Performed at [facility], [address], [place], [place] [address]  Basic metabolic panel     Status: Abnormal   Collection  Time: 01/03/24  3:09 AM  Result Value Ref Range   Sodium 136 135 - 145 mmol/L   Potassium 3.7 3.5 - 5.1 mmol/L   Chloride 107 98 - 111 mmol/L   CO2 21 (L) 22 - 32 mmol/L   Glucose, Bld 120 (H) 70 - 99 mg/dL    Comment: Glucose reference range  applies only to samples taken after fasting for at least 8 hours.   BUN 41 (H) 8 - 23 mg/dL   Creatinine, Ser 8.31 (H) 0.61 - 1.24 mg/dL   Calcium  8.2 (L) 8.9 - 10.3 mg/dL   GFR, Estimated 42 (L) >60 mL/min    Comment: (NOTE) Calculated using the CKD-EPI Creatinine Equation (2021)    Anion gap 8 5 - 15    Comment: Performed at [facility], [address], [place], [place] [address]  MRSA Next Gen by PCR, Nasal     Status: None   Collection Time: 01/03/24 11:39 AM   Specimen: Nasal Mucosa; Nasal Swab  Result Value Ref Range   MRSA by PCR Next Gen NOT DETECTED NOT DETECTED    Comment: (NOTE) The GeneXpert MRSA Assay (FDA approved for NASAL specimens only), is one component of a comprehensive MRSA colonization surveillance program. It is not intended to diagnose MRSA infection nor to guide or monitor treatment for MRSA infections. Test performance is not FDA approved in patients less than 55 years old. Performed at [facility], [address], [place], [place] [address]   Basic metabolic panel     Status: Abnormal   Collection Time: 01/04/24  4:29 AM  Result Value Ref Range   Sodium 140 135 - 145 mmol/L   Potassium 3.6 3.5 - 5.1 mmol/L   Chloride 108 98 - 111 mmol/L   CO2 21 (L) 22 - 32 mmol/L   Glucose, Bld 141 (H) 70 - 99 mg/dL    Comment: Glucose reference range applies only to samples taken after fasting for at least 8 hours.   BUN 53 (H) 8 - 23 mg/dL   Creatinine, Ser 8.30 (H) 0.61 - 1.24 mg/dL   Calcium  8.1 (L) 8.9 - 10.3 mg/dL   GFR, Estimated 41 (L) >60 mL/min    Comment: (NOTE) Calculated using the CKD-EPI Creatinine Equation (2021)    Anion gap 11 5 - 15    Comment: Performed at  [facility], [address], [place], [place] [address]    Imaging / Studies: DG Abd Portable 1V-Small Bowel Obstruction Protocol-initial, 8 hr delay Result Date: 01/03/2024 CLINICAL DATA:  8 hour small-bowel follow-up film EXAM: PORTABLE ABDOMEN - 1 VIEW COMPARISON:  01/01/2024 FINDINGS: Gastric catheter is noted within the stomach. Administered contrast now lie is primarily within the mildly dilated small bowel. Some colonic contrast is seen indicating a partial small bowel obstruction. IMPRESSION: Dilated small bowel with contrast within. Some contrast reaches stomach consistent with a partial small bowel obstruction. Electronically Signed   By: Oneil Devonshire M.D.   On: 01/03/2024 19:45    Medications / Allergies: per chart  Antibiotics: Anti-infectives (From admission, onward)    Start     Dose/Rate Route Frequency Ordered Stop   12/29/23 1600  ceFAZolin  (ANCEF ) IVPB 2g/100 mL premix        2 g 200 mL/hr over 30 Minutes Intravenous Every 8 hours 12/29/23 1401 12/30/23 0128   12/29/23 1600  metroNIDAZOLE  (FLAGYL ) IVPB 500 mg        500 mg 100 mL/hr over 60 Minutes Intravenous Every 8 hours 12/29/23 1401 12/30/23 0844   12/29/23 0600  ceFAZolin  (ANCEF ) IVPB 2g/100 mL premix       Placed in And Linked Group   2 g 200 mL/hr over 30 Minutes Intravenous On call to O.R. 12/29/23 9470 12/29/23 0734   12/29/23 0600  metroNIDAZOLE  (FLAGYL ) IVPB 500 mg  Placed in And Linked Group   500 mg 100 mL/hr over 60 Minutes Intravenous On call to O.R. 12/29/23 9470 12/29/23 0746         Note: Portions of this report may have been transcribed using voice recognition software. Every effort was made to ensure accuracy; however, inadvertent computerized transcription errors may be present.   Any transcriptional errors that result from this process are unintentional.    Elspeth KYM Schultze, MD, FACS, MASCRS Esophageal, Gastrointestinal & Colorectal Surgery Robotic and  Minimally Invasive Surgery  [facility] [address] [place], [place] [address] [phone] Fax [phone] Main  CONTACT INFORMATION: Weekday (9AM-5PM): Call [facility] main office at [phone] Weeknight (5PM-9AM) or Weekend/Holiday: Check EPIC Web Links tab & use AMION (password  [email or web address]) for General Surgery [facility] coverage  Please, DO NOT use SecureChat  (it is not reliable communication to reach operating surgeons & will lead to a delay in care).   Epic staff messaging available for outptient concerns needing 1-2 business day response.      01/04/2024  7:20 AM

## 2024-01-04 NOTE — Assessment & Plan Note (Signed)
 Will resume statin therapy once tolerating oral intake

## 2024-01-04 NOTE — Assessment & Plan Note (Signed)
 Management per general surgery General Surgery recommending initiation of TPN, PICC line ordered Daily abdominal x-rays Serial abdominal exams Continuing clear liquid diet

## 2024-01-04 NOTE — Assessment & Plan Note (Signed)
 Status post operative intervention as noted above Postoperative management per general surgery

## 2024-01-04 NOTE — Assessment & Plan Note (Signed)
 Continued poor rate control despite transition from diltiazem  to scheduled intravenous metoprolol   Cardiology following, recommending low-dose intravenous digoxin  load  Considering elevated CHA2DS2-VASc or patient is now on heparin  infusion TSH unremarkable.  Echocardiogram reveals preserved ejection fraction with no significant valvular abnormality

## 2024-01-04 NOTE — Assessment & Plan Note (Signed)
 No recent episodes

## 2024-01-04 NOTE — Assessment & Plan Note (Signed)
 Baseline creatinine 1.5-1.7 Creatinine now 1.69, down from 2.14 during this hospitalization Continuing to monitor renal function with serial chemistries

## 2024-01-04 NOTE — Assessment & Plan Note (Signed)
 Due to to continued inability to resume enteral feeding, PICC line order placed for initiation of TPN Pharmacy consult additionally placed for TPN recommendation

## 2024-01-04 NOTE — Assessment & Plan Note (Signed)
 Patient has exhibited bouts of confusion and agitation as of late in the setting of prolonged hospitalization and acute illness Today, symptoms of confusion are extremely minimal Advised against routine use of benzodiazepine use in this patient population as it can have a paradoxical effect in some patients Would first recommend minimizing noxious stimuli by minimizing medical interventions If patient continues to exhibit bouts of confusion would try to get patient to commit to a regular sleep cycle with melatonin nightly Finally, if some degree of chemical sedation must be used then extremely low-dose Haldol can be effective when used sparingly if QTc is acceptable

## 2024-01-04 NOTE — Progress Notes (Signed)
 PHARMACY - ANTICOAGULATION CONSULT NOTE  Pharmacy Consult for heparin  Indication: atrial fibrillation  Allergies  Allergen Reactions   Amiodarone      Hallucinations    Oxycodone  Other (See Comments)    Bad dreams and hallucinations    Patient Measurements: Height: 5' 9 (175.3 cm) Weight: 82.1 kg (181 lb) IBW/kg (Calculated) : 70.7 Heparin  Dosing Weight: 82.1  Vital Signs: Temp: 98.2 F (36.8 C) (01/07 2301) Temp Source: Oral (01/07 2301) BP: 100/60 (01/08 0600) Pulse Rate: 86 (01/08 0600)  Labs: Recent Labs    01/01/24 0903 01/01/24 1302 01/02/24 0408 01/03/24 0309 01/04/24 0429  HGB  --   --  9.4* 10.0*  --   HCT  --   --  28.5* 31.6*  --   PLT  --   --  169 207  --   APTT  --  40*  --   --   --   LABPROT  --  15.6*  --   --   --   INR  --  1.2  --   --   --   CREATININE 2.14*  --  1.94* 1.68* 1.69*  TROPONINIHS 24* 23*  --   --   --     Estimated Creatinine Clearance: 36.6 mL/min (A) (by C-G formula based on SCr of 1.69 mg/dL (H)).   Medical History: Past Medical History:  Diagnosis Date   Atrial fibrillation, rapid (HCC)    With high ventricular rates. Intol amio, was on Sotalol , now on BB   Bladder cancer (HCC) dx'd 10/2019   Bursitis of right shoulder    CAD (coronary artery disease) 11/2012   CABG x 4 using left internal mammary artery and right endovein harvest. (LIMA-LAD; VG-OM1, OM2, VG-PD), EF 55-60% at cath   Degenerative disc disease, lumbar    Dysrhythmia    Epigastric pain    Myoview 11/09/12 Showed evidence of reversible ischemia in both the inferior wall & the anteroapical distribution.    Erectile dysfunction    H/O ED while taking a beta-blocker   GERD (gastroesophageal reflux disease)    Gout    Hiatal hernia    History of kidney stones    Hyperlipidemia    Hypertension    Systemic HTN. Pt has H/O of ED when taking a beta-blocker. ECHO 11/26/08 Showed no evidence of any significant valvular disease, Estimated EF = 50-60%.    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 Amia intol. Was on Sotalol , now on Lopressor    Polio    Age 41. 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   Past Week   allopurinol  (ZYLOPRIM ) 300 MG tablet Take 300 mg by mouth daily.   12/28/2023   ferrous sulfate  325 (65 FE) MG tablet Take 325 mg by mouth 3 (three) times a week.   12/28/2023  fluticasone  (FLONASE ) 50 MCG/ACT nasal spray Place 2 sprays into both nostrils daily as needed for allergies.   12/28/2023 Morning   metoprolol  tartrate (LOPRESSOR ) 50 MG tablet Take 1 tablet (50 mg total) by mouth 2 (two) times daily. 180 tablet 3 12/29/2023 at  3:30 AM   simvastatin  (ZOCOR ) 40 MG tablet TAKE 1 TABLET BY MOUTH EVERYDAY AT BEDTIME 90 tablet 3 12/28/2023    Assessment: 78 yo M s/p cystoprostatectomy with ileal, incisional and parastomal ileal conduit hernia repairs on 12/29/2023.  Pharmacy consulted to dose heparin  for Afib. No anticoagulants PTA.  On 1/7 Hg 10, PLT 207 SCr 1.69  Goal of Therapy:  Heparin  level 0.3-0.7 units/ml Monitor platelets by anticoagulation protocol: Yes   Plan:  DC LMWH 40 Heparin  4000 unit IV bolus then heparin  drip 1150 units/hr Check 8 hr heparin  level Daily CBC & heparin  level Plan to transition to Eliquis  once gut working  Rosaline IVAR Edison, Pharm.D Use secure chat for questions 01/04/2024 8:15 AM

## 2024-01-04 NOTE — Assessment & Plan Note (Signed)
 No current clinical evidence of bleeding Hemoglobin now stable Considering near 3 g drop in hemoglobin over the past 3 weeks will obtain iron panel, folate, vitamin B12

## 2024-01-04 NOTE — Plan of Care (Signed)
 At 21:00 NGT residual was , restarted LIS and total output thru the night was 1,434ml. Patient was anxious but not complaining of abdominal pain, nausea and no vomiting. JINNY Kipper NP was made aware.   All night, HR raising from 120-150's even when patient was asleep but not sustaining. Gave Metoprolol  5mg  x 2 with no improvement. J.Daniels NP aware and ordered to monitor and report when HR sustaining.    Problem: Clinical Measurements: Goal: Cardiovascular complication will be avoided Outcome: Not Progressing   Problem: Coping: Goal: Level of anxiety will decrease Outcome: Not Progressing   Problem: Elimination: Goal: Will not experience complications related to bowel motility Outcome: Not Progressing

## 2024-01-04 NOTE — Progress Notes (Signed)
 PHARMACY - ANTICOAGULATION CONSULT NOTE  Pharmacy Consult for heparin  Indication: atrial fibrillation  Allergies  Allergen Reactions   Amiodarone      Hallucinations    Oxycodone  Other (See Comments)    Bad dreams and hallucinations    Patient Measurements: Height: 5' 9 (175.3 cm) Weight: 82.1 kg (181 lb) IBW/kg (Calculated) : 70.7 Heparin  Dosing Weight: 82.1  Vital Signs: Temp: 97.7 F (36.5 C) (01/08 2000) Temp Source: Oral (01/08 2000) BP: 139/72 (01/08 2000) Pulse Rate: 55 (01/08 2000)  Labs: Recent Labs    01/02/24 0408 01/03/24 0309 01/04/24 0429 01/04/24 1618  HGB 9.4* 10.0*  --   --   HCT 28.5* 31.6*  --   --   PLT 169 207  --   --   HEPARINUNFRC  --   --   --  >1.10*  CREATININE 1.94* 1.68* 1.69*  --     Estimated Creatinine Clearance: 36.6 mL/min (A) (by C-G formula based on SCr of 1.69 mg/dL (H)).   Medical History: Past Medical History:  Diagnosis Date   Atrial fibrillation, rapid (HCC)    With high ventricular rates. Intol amio, was on Sotalol , now on BB   Bladder cancer (HCC) dx'd 10/2019   Bursitis of right shoulder    CAD (coronary artery disease) 11/2012   CABG x 4 using left internal mammary artery and right endovein harvest. (LIMA-LAD; VG-OM1, OM2, VG-PD), EF 55-60% at cath   Degenerative disc disease, lumbar    Dysrhythmia    Epigastric pain    Myoview 11/09/12 Showed evidence of reversible ischemia in both the inferior wall & the anteroapical distribution.    Erectile dysfunction    H/O ED while taking a beta-blocker   GERD (gastroesophageal reflux disease)    Gout    Hiatal hernia    History of kidney stones    Hyperlipidemia    Hypertension    Systemic HTN. Pt has H/O of ED when taking a beta-blocker. ECHO 11/26/08 Showed no evidence of any significant valvular disease, Estimated EF = 50-60%.   Mononeuritis of unspecified site    Nocturia    NSVT (nonsustained ventricular tachycardia) (HCC)    Obesity    Osteoarthritis  08/2019   right knee; cortisone shot    PAF (paroxysmal atrial fibrillation) (HCC)    Post CABG. Amia intol. 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   Past Week   allopurinol  (ZYLOPRIM ) 300 MG tablet Take 300 mg by mouth daily.   12/28/2023   ferrous sulfate  325 (65 FE) MG tablet Take 325 mg by mouth 3 (three) times a week.   12/28/2023   fluticasone  (FLONASE ) 50 MCG/ACT nasal spray Place 2 sprays into both nostrils daily as needed for allergies.   12/28/2023 Morning   metoprolol  tartrate (LOPRESSOR ) 50 MG tablet Take 1 tablet (50 mg total) by mouth 2 (two) times daily. 180 tablet 3 12/29/2023 at  3:30 AM   simvastatin  (ZOCOR ) 40 MG tablet  TAKE 1 TABLET BY MOUTH EVERYDAY AT BEDTIME 90 tablet 3 12/28/2023    Assessment: 78 yo M s/p cystoprostatectomy with ileal, incisional and parastomal ileal conduit hernia repairs on 12/29/2023.  Pharmacy consulted to dose heparin  for Afib. No anticoagulants PTA.  On 1/7 Hg 10, PLT 207, SCr 1.69  01/04/2024: Heparin  level >1.; elevated on IV heparin  1150 units/hr.  Initial level drawn from same line as heparin  so heparin  was switched to PIV site and re-drawn.  Level still elevated. No bleeding or infusion related concerns reported by RN  Goal of Therapy:  Heparin  level 0.3-0.7 units/ml Monitor platelets by anticoagulation protocol: Yes   Plan:  Hold IV heparin  x 90 min then resume IV heparin  infusion at 900 units/hr Check 8 hr heparin  level ~7a Daily CBC & heparin  level Plan to transition to Eliquis  once gut  working  Rosaline Millet, PharmD, BCPS 01/04/2024 9:17 PM

## 2024-01-04 NOTE — Assessment & Plan Note (Signed)
 Resume Allopurinol once taking p.o. consistently

## 2024-01-04 NOTE — Progress Notes (Signed)
 PHARMACY - TOTAL PARENTERAL NUTRITION CONSULT NOTE   Indication: Prolonged ileus  Patient Measurements: Height: 5' 9 (175.3 cm) Weight: 82.1 kg (181 lb) IBW/kg (Calculated) : 70.7 TPN AdjBW (KG): 82.1 Body mass index is 26.73 kg/m. 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  Postoperative ileus requiring NG tube placement. Pharmacy consulted 01/04/2024 for TPN due to prolonged ileus.  Glucose / Insulin : no hx DM Electrolytes: K 3.6- 4 runs K ordered by TRH, phos 3.1 Mag 2.5 Renal: CKD3a Hepatic: LFTs WNL on 1/6 Intake / Output; MIVF:  1/7 640 mls CLD 1/7 NGO 1550 1/7 UOP 1500 1/7 3 stools GI Imaging: 1/7 PM KUB: pSBO GI Surgeries / Procedures: 1/2: cystoprostatectomy with ileal, incisional and parastomal ileal conduit hernia repairs   Central access: has single lumen PAC with heparin  infusing, PICC ordered for TPN 01/04/2024 TPN start date: 01/04/2024  Nutritional Goals: Goal TPN rate is 83 mL/hr (provides ~ 110 g of protein and ~ 2120 kcals per day)  RD Assessment:  2100-2300 kcal, 100-115g protein and 2L fluid.  Current Nutrition:  Clear liquids  Plan:  Start TPN at 40 mL/hr at 1800 and assess tolerance Electrolytes in TPN: Na 12mEq/L, K 48mEq/L, Ca 76mEq/L, Mg 46mEq/L, and Phos 15mmol/L. Cl:Ac 1:1 Add standard MVI and trace elements to TPN Pepcid  10 mg IV x 1 then add pepcid  20 mg IV to TPN bag Thiamine  100 mg IV x 5 days (LD 1/12) Initiate Sensitive q6h SSI and adjust as needed  Currently no IVFs per [facility] Monitor TPN labs on Mon/Thurs   Rosaline IVAR Edison, Pharm.D Use secure chat for questions 01/04/2024 11:34 AM

## 2024-01-04 NOTE — Progress Notes (Signed)
 Initial Nutrition Assessment  DOCUMENTATION CODES:   Severe malnutrition in context of acute illness/injury  INTERVENTION:   -Monitor magnesium , potassium, and phosphorus  for at least 3 days, MD to replete as needed, as pt is at risk for refeeding syndrome. -Thiamine  100 mg daily for 5 days   -TPN management per Pharmacy -Daily weights while on TPN  NUTRITION DIAGNOSIS:   Severe Malnutrition related to acute illness as evidenced by severe fat depletion, moderate muscle depletion, energy intake < or equal to 50% for > or equal to 5 days.  GOAL:   Patient will meet greater than or equal to 90% of their needs  MONITOR:   Labs, Weight trends, I & O's, PO intake (TPN)  REASON FOR ASSESSMENT:   Consult New TPN/TNA  ASSESSMENT:   78 year old man PMH including PAF perioperative and during hospitalizations, CAD status post CABG, NSVT, status post cystoprostatectomy with ileal, incisional and parastomal ileal conduit hernia repairs, on attending service of [facility], who developed postoperative atrial fibrillation with RVR.  1/2: admitted, CLD->FLD, s/p abdominal and parastomal hernia repairs, LOA 1/3: Soft diet 1/4: heart healthy diet 1/5: NPO, NGT placed 1/6: CLD  Patient in room, wife at bedside. Pt able to answer questions but seems pretty sleepy. Pt states he is sipping on soda. Pt wanted a cookie last night but only on clears. Had a large amount of output from his NGT overnight, >1L. Pt to now start TPN while still advancing diet.  Per wife pt was having vomiting 1/1, prior to that he was eating normally with no issues. Had surgery 1/2 and initially tried to eat something afterwards but ended up vomiting up bile. Pt tolerated jello and broth last night.   TPN to begin tonight at 40 ml/hr, providing 1021 kcals and 52g protein.  Per weight records, no weights taken since 1/2. Needs updated weight. Daily weights ordered while on TPN.  Medications: Dulcolax, Thiamine , Pepcid ,  KCl  Labs reviewed: Elevated Mg   NUTRITION - FOCUSED PHYSICAL EXAM:  Flowsheet Row Most Recent Value  Orbital Region Severe depletion  Upper Arm Region Moderate depletion  Thoracic and Lumbar Region Unable to assess  Buccal Region Severe depletion  Temple Region Moderate depletion  Clavicle Bone Region Moderate depletion  Clavicle and Acromion Bone Region Mild depletion  Scapular Bone Region Mild depletion  Dorsal Hand No depletion  [swelling]  Patellar Region Mild depletion  Anterior Thigh Region Mild depletion  Posterior Calf Region Mild depletion  Edema (RD Assessment) Mild  Hair Reviewed  Eyes Reviewed  Mouth Reviewed  Skin Reviewed  Nails Reviewed       Diet Order:   Diet Order             Diet clear liquid Room service appropriate? Yes; Fluid consistency: Thin  Diet effective now           Diet - low sodium heart healthy                   EDUCATION NEEDS:   Education needs have been addressed  Skin:  Skin Assessment: Reviewed RN Assessment  Last BM:  1/7 -type 7  Height:   Ht Readings from Last 1 Encounters:  12/29/23 5' 9 (1.753 m)    Weight:   Wt Readings from Last 1 Encounters:  12/29/23 82.1 kg    BMI:  Body mass index is 26.73 kg/m.  Estimated Nutritional Needs:   Kcal:  2100-2300  Protein:  100-115g  Fluid:  2L/day  Morna Lee, MS, RD, LDN Inpatient Clinical Dietitian Contact via Secure chat

## 2024-01-04 NOTE — Progress Notes (Signed)
 Occupational Therapy Treatment Patient Details Name: Hunter [name] MRN: [id number] DOB: 1946/11/11 Today's Date: 01/04/2024   History of present illness 78 yo male presents to therapy following hospital admission on 12/28/2022 for abdominal hernia repair. 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   OT comments  Today's OT session focused on RUE rehab as pt reports severe set back in RUE function since his bladder surgery, and given chronic limitations to LUE from polio, pt asked to prioritize this. Pt completed exercises as described below with positive results and end AROM of RUE to 90 degrees shoulder flexion with scapulae stabilized on reclined chair.   Pt and wife very actively involved and wife supportive of cuing pt to perform all exercises from neck to RT shoulder and scapulae as able.        If plan is discharge home, recommend the following:  A little help with walking and/or transfers;A little help with bathing/dressing/bathroom;A lot of help with bathing/dressing/bathroom;Assistance with cooking/housework;Assist for transportation;Help with stairs or ramp for entrance;Supervision due to cognitive status   Equipment Recommendations  None recommended by OT    Recommendations for Other Services      Precautions / Restrictions Precautions Precautions: Fall Precaution Comments: L U weakness-chronic frmo polio, monitor HR Restrictions Weight Bearing Restrictions Per Provider Order: No       Mobility Bed Mobility               General bed mobility comments: Pt received in recliner.    Transfers                         Balance                                           ADL either performed or assessed with clinical judgement   ADL                                          General ADL Comments: Session focused on RUE rehab in support of RT arm function for ADLs and mobility as pt with chronic atrophy to LUE from polio as child.    Extremity/Trunk Assessment Upper Extremity Assessment RUE Deficits / Details: RUE edetamous with min blistering to dorsum of forearm; possible IV infiltrate per wife. Weakness since ACDF with pt and wife reporting exacerbated weakness with admission and abd surgery. PROM WFL; AROM shoulder to ~15 degrees. 3/5 grip. pronation/supination WFL. Elbow flexion to ~110 degrees. (See Exercises) LUE Deficits / Details: impaired at baseline, length discrepancy from polio since 8 mos of age. weakness. unable to supinate. profoud weakness at shoulder. PROM not formally assessed. Noted acute dorsal hand swelling and LUE elevated on 2 pillows.   Lower Extremity Assessment Lower Extremity Assessment: Defer to PT evaluation   Cervical / Trunk Assessment Cervical / Trunk Assessment: Neck Surgery    Vision Ability to See in Adequate Light: 0 Adequate Patient Visual Report: No change from baseline     Perception     Praxis      Cognition Arousal: Alert Behavior During Therapy: [facility] for tasks assessed/performed,  Impulsive Overall Cognitive Status: Within Functional Limits for tasks assessed                                 General Comments: Easily redirected to task.        Exercises Other Exercises Other Exercises: Recliner sitting upright: Gentle neck rotation, lateral flexion (very limited), and neck ext/flexion with instructions to stop with any pain. Pt completed 10 reps of assisted scapular retractions with 2-3 sec holds with breaks as needed for tachycardia. Pt then perform 7 reps closed chain Shoulder flexion with scapular add and abd.  Pt then reclined and performed AAROM of RT sh flex to 90*. Pt tried with AROM and unable. Pt reclined further for scapular stabilization and was able to perform AROM Sh flex to 90 x 5 reps.   Pt then assisted with holding 90 degree position and performed ceiling punches x 5 for serratus ant stregnthening.   Wife present and observing and stated that she can assist with carry over and continuing exercises.    Shoulder Instructions       General Comments      Pertinent Vitals/ Pain       Pain Assessment Pain Location: Pt denied pain with all exercises. Pain Intervention(s): Monitored during session  Home Living                                          Prior Functioning/Environment              Frequency  Min 1X/week        Progress Toward Goals  OT Goals(current goals can now be found in the care plan section)  Progress towards OT goals: Progressing toward goals  Acute Rehab OT Goals Patient Stated Goal: Work on my right arm some more. OT Goal Formulation: With patient/family Time For Goal Achievement: 01/14/24 Potential to Achieve Goals: Good  Plan      Co-evaluation                 AM-PAC OT 6 Clicks Daily Activity     Outcome Measure   Help from another person eating meals?: None Help from another person taking care of personal grooming?: A Little Help from another person toileting, which includes using toliet, bedpan, or urinal?: A Little Help from another person bathing (including washing, rinsing, drying)?: A Little Help from another person to put on and taking off regular upper body clothing?: A Little Help from another person to put on and taking off regular lower body clothing?: A Lot 6 Click Score: 18    End of Session    OT Visit Diagnosis: Unsteadiness on feet (R26.81);Muscle weakness (generalized) (M62.81)   Activity Tolerance Patient tolerated treatment well   Patient Left in chair;with chair alarm set;with nursing/sitter in room;with family/visitor present   Nurse Communication Other (comment) (End of session per RN request for NGT)        Time: 1350-1435 OT Time Calculation (min): 45  min  Charges: OT General Charges $OT Visit: 1 Visit OT Treatments $Neuromuscular Re-education: 8-22 mins $Therapeutic Exercise: 23-37 mins  Delon, OT Acute Rehab Services Office: [phone] 01/04/2024   Delon Falter 01/04/2024, 2:59 PM

## 2024-01-05 ENCOUNTER — Inpatient Hospital Stay (HOSPITAL_COMMUNITY): Payer: Medicare Other

## 2024-01-05 DIAGNOSIS — K430 Incisional hernia with obstruction, without gangrene: Secondary | ICD-10-CM | POA: Diagnosis not present

## 2024-01-05 DIAGNOSIS — N172 Acute kidney failure with medullary necrosis: Secondary | ICD-10-CM

## 2024-01-05 DIAGNOSIS — E782 Mixed hyperlipidemia: Secondary | ICD-10-CM

## 2024-01-05 DIAGNOSIS — I480 Paroxysmal atrial fibrillation: Secondary | ICD-10-CM | POA: Diagnosis not present

## 2024-01-05 DIAGNOSIS — G9341 Metabolic encephalopathy: Secondary | ICD-10-CM | POA: Diagnosis not present

## 2024-01-05 DIAGNOSIS — K56609 Unspecified intestinal obstruction, unspecified as to partial versus complete obstruction: Secondary | ICD-10-CM

## 2024-01-05 DIAGNOSIS — I4729 Other ventricular tachycardia: Secondary | ICD-10-CM | POA: Diagnosis not present

## 2024-01-05 LAB — VITAMIN B12: Vitamin B-12: 390 pg/mL (ref 180–914)

## 2024-01-05 LAB — HEPARIN LEVEL (UNFRACTIONATED): Heparin Unfractionated: 0.19 [IU]/mL — ABNORMAL LOW (ref 0.30–0.70)

## 2024-01-05 LAB — GLUCOSE, CAPILLARY
Glucose-Capillary: 109 mg/dL — ABNORMAL HIGH (ref 70–99)
Glucose-Capillary: 112 mg/dL — ABNORMAL HIGH (ref 70–99)
Glucose-Capillary: 115 mg/dL — ABNORMAL HIGH (ref 70–99)
Glucose-Capillary: 128 mg/dL — ABNORMAL HIGH (ref 70–99)

## 2024-01-05 LAB — COMPREHENSIVE METABOLIC PANEL
ALT: 12 U/L (ref 0–44)
AST: 17 U/L (ref 15–41)
Albumin: 2.9 g/dL — ABNORMAL LOW (ref 3.5–5.0)
Alkaline Phosphatase: 50 U/L (ref 38–126)
Anion gap: 8 (ref 5–15)
BUN: 50 mg/dL — ABNORMAL HIGH (ref 8–23)
CO2: 22 mmol/L (ref 22–32)
Calcium: 8.1 mg/dL — ABNORMAL LOW (ref 8.9–10.3)
Chloride: 112 mmol/L — ABNORMAL HIGH (ref 98–111)
Creatinine, Ser: 1.6 mg/dL — ABNORMAL HIGH (ref 0.61–1.24)
GFR, Estimated: 44 mL/min — ABNORMAL LOW (ref >60)
Glucose, Bld: 127 mg/dL — ABNORMAL HIGH (ref 70–99)
Potassium: 4.3 mmol/L (ref 3.5–5.1)
Sodium: 142 mmol/L (ref 135–145)
Total Bilirubin: 0.7 mg/dL (ref 0.0–1.2)
Total Protein: 5.9 g/dL — ABNORMAL LOW (ref 6.5–8.1)

## 2024-01-05 LAB — MAGNESIUM: Magnesium: 2.2 mg/dL (ref 1.7–2.4)

## 2024-01-05 LAB — PHOSPHORUS: Phosphorus: 2.7 mg/dL (ref 2.5–4.6)

## 2024-01-05 LAB — CBC
HCT: 34.7 % — ABNORMAL LOW (ref 39.0–52.0)
Hemoglobin: 10.9 g/dL — ABNORMAL LOW (ref 13.0–17.0)
MCH: 31.6 pg (ref 26.0–34.0)
MCHC: 31.4 g/dL (ref 30.0–36.0)
MCV: 100.6 fL — ABNORMAL HIGH (ref 80.0–100.0)
Platelets: 255 10*3/uL (ref 150–400)
RBC: 3.45 MIL/uL — ABNORMAL LOW (ref 4.22–5.81)
RDW: 14 % (ref 11.5–15.5)
WBC: 7.5 10*3/uL (ref 4.0–10.5)
nRBC: 0 % (ref 0.0–0.2)

## 2024-01-05 LAB — IRON AND TIBC
Iron: 42 ug/dL — ABNORMAL LOW (ref 45–182)
Saturation Ratios: 19 % (ref 17.9–39.5)
TIBC: 227 ug/dL — ABNORMAL LOW (ref 250–450)
UIBC: 185 ug/dL

## 2024-01-05 LAB — FOLATE: Folate: 8.1 ng/mL (ref >5.9)

## 2024-01-05 LAB — FERRITIN: Ferritin: 191 ng/mL (ref 24–336)

## 2024-01-05 LAB — TROPONIN I (HIGH SENSITIVITY): Troponin I (High Sensitivity): 10 ng/L (ref <18)

## 2024-01-05 MED ORDER — SIMVASTATIN 40 MG PO TABS
40.0000 mg | ORAL_TABLET | Freq: Every day | ORAL | Status: DC
  Filled 2024-01-05: qty 1

## 2024-01-05 MED ORDER — METOPROLOL TARTRATE 50 MG PO TABS
50.0000 mg | ORAL_TABLET | Freq: Two times a day (BID) | ORAL | Status: DC
  Administered 2024-01-05 – 2024-01-08 (×7): 50 mg via ORAL
  Filled 2024-01-05: qty 1
  Filled 2024-01-05 (×3): qty 2
  Filled 2024-01-05 (×3): qty 1

## 2024-01-05 MED ORDER — ENSURE ENLIVE PO LIQD
237.0000 mL | Freq: Two times a day (BID) | ORAL | Status: DC
  Administered 2024-01-05 – 2024-01-08 (×3): 237 mL via ORAL

## 2024-01-05 MED ORDER — ACETAMINOPHEN 650 MG RE SUPP: 650.0000 mg | Freq: Four times a day (QID) | RECTAL | Status: DC | PRN

## 2024-01-05 MED ORDER — ALLOPURINOL 300 MG PO TABS
300.0000 mg | ORAL_TABLET | Freq: Every day | ORAL | Status: DC
  Administered 2024-01-05 – 2024-01-08 (×4): 300 mg via ORAL
  Filled 2024-01-05 (×2): qty 1
  Filled 2024-01-05 (×2): qty 3

## 2024-01-05 MED ORDER — CALCIUM POLYCARBOPHIL 625 MG PO TABS
625.0000 mg | ORAL_TABLET | Freq: Two times a day (BID) | ORAL | Status: DC
  Administered 2024-01-05 – 2024-01-07 (×5): 625 mg via ORAL
  Filled 2024-01-05 (×7): qty 1

## 2024-01-05 MED ORDER — DIGOXIN 0.25 MG/ML IJ SOLN
INTRAMUSCULAR | Status: AC
  Filled 2024-01-05: qty 2

## 2024-01-05 MED ORDER — HYDROCODONE-ACETAMINOPHEN 5-325 MG PO TABS
1.0000 | ORAL_TABLET | ORAL | Status: DC | PRN
Start: 2024-01-05 — End: 2024-01-08

## 2024-01-05 MED ORDER — AMIODARONE LOAD VIA INFUSION
150.0000 mg | Freq: Once | INTRAVENOUS | Status: AC
  Administered 2024-01-05: 150 mg via INTRAVENOUS
  Filled 2024-01-05: qty 83.34

## 2024-01-05 MED ORDER — BISACODYL 10 MG RE SUPP
10.0000 mg | Freq: Every day | RECTAL | Status: DC
  Filled 2024-01-05 (×3): qty 1

## 2024-01-05 MED ORDER — AMIODARONE HCL IN DEXTROSE 360-4.14 MG/200ML-% IV SOLN
30.0000 mg/h | INTRAVENOUS | Status: DC
  Administered 2024-01-05: 30 mg/h via INTRAVENOUS
  Filled 2024-01-05 (×2): qty 200

## 2024-01-05 MED ORDER — SODIUM CHLORIDE 0.9 % IV BOLUS: 500.0000 mL | Freq: Once | INTRAVENOUS | Status: DC

## 2024-01-05 MED ORDER — ACETAMINOPHEN 325 MG PO TABS: 325.0000 mg | ORAL_TABLET | Freq: Four times a day (QID) | ORAL | Status: DC | PRN

## 2024-01-05 MED ORDER — SODIUM CHLORIDE 0.9 % IV BOLUS
500.0000 mL | Freq: Once | INTRAVENOUS | Status: AC
  Administered 2024-01-05: 500 mL via INTRAVENOUS

## 2024-01-05 MED ORDER — ENOXAPARIN SODIUM 100 MG/ML IJ SOSY
1.0000 mg/kg | PREFILLED_SYRINGE | Freq: Two times a day (BID) | INTRAMUSCULAR | Status: DC
  Administered 2024-01-05 – 2024-01-06 (×2): 85 mg via SUBCUTANEOUS
  Filled 2024-01-05 (×2): qty 1

## 2024-01-05 MED ORDER — DILTIAZEM HCL 25 MG/5ML IV SOLN: 10.0000 mg | Freq: Once | INTRAVENOUS | Status: DC

## 2024-01-05 MED ORDER — AMIODARONE HCL IN DEXTROSE 360-4.14 MG/200ML-% IV SOLN
60.0000 mg/h | INTRAVENOUS | Status: AC
  Administered 2024-01-05 (×2): 60 mg/h via INTRAVENOUS
  Filled 2024-01-05 (×2): qty 200

## 2024-01-05 MED ORDER — DIGOXIN 0.25 MG/ML IJ SOLN
0.1250 mg | Freq: Once | INTRAMUSCULAR | Status: AC
  Administered 2024-01-05: 0.125 mg via INTRAVENOUS

## 2024-01-05 MED ORDER — SODIUM CHLORIDE 0.9 % IV SOLN: Freq: Once | INTRAVENOUS | Status: AC

## 2024-01-05 MED ORDER — METHOCARBAMOL 500 MG PO TABS: 1000.0000 mg | ORAL_TABLET | Freq: Four times a day (QID) | ORAL | Status: DC | PRN

## 2024-01-05 NOTE — Assessment & Plan Note (Signed)
 Due to improving ileus, attempting to advance diet today, management per general surgery Pharmacy consult additionally placed for TPN recommendation

## 2024-01-05 NOTE — Assessment & Plan Note (Signed)
 No recent episodes

## 2024-01-05 NOTE — Assessment & Plan Note (Signed)
 Resumed statin therapy

## 2024-01-05 NOTE — Progress Notes (Addendum)
 PROGRESS NOTE   Hunter [name]  FMW:[id number] DOB: [birth year] DOA: 12/29/2023 PCP: Paola Greig CROME, FNP   Date of Service: the patient was seen and examined on 01/05/2024  Brief Narrative:  78 year old man PMH CAD (s/p CABG in 2013 with LIMA-LAD, SVG-OM1-OM2 and SVG-PDA), paroxysmal atrial fibrillation, NSVT, chronic kidney disease stage IIIb, hypertension, hyperlipidemia, gastroesophageal reflux disease, gout who presented to [facility] on 1/2 for robotic assisted ventral incisional abdominal wall hernia and parastomal hernia repair admitted postoperatively to the general surgery service with Dr. Sheldon.    In the days following the surgery the patient began to develop multiple complications including acute kidney injury superimposed upon his chronic kidney disease stage IIIb for which the hospitalist group was consulted as well as developing rapid atrial fibrillation for which cardiology was consulted.   Atrial fibrillation had proven to be difficult to manage, initially not responding well to diltiazem  or metoprolol .  Attempts were then made to load patient with digoxin  on 1/8 and after that was unsuccessful patient and patient began to convert into rapid atrial flutter they were placed on an amiodarone  infusion morning of 1/9 which proved to be effective.  Patient was initially anticoagulated with heparin  and later transitioned to Lovenox  due to elevated CHA2DS2-VASc score.  Patient's acute kidney injury gradually did improve with improving clinical condition and intravenous hydration.  Due to prolonged postoperative ileus patient was initiated on TPN on 1/8 for a brief period of time but with improving bowel function diet was advanced on 1/9 and TPN was discontinued.    Assessment & Plan Ileus (HCC) Management per general surgery TPN was initiated on 1/8 but with what seems to be improving ileus today diet has been advanced to a soft diet. If patient tolerates soft diet well,  TPN may likely be able to be discontinued. Acute renal failure superimposed on stage 3b chronic kidney disease (HCC) Baseline creatinine 1.5-1.7 Creatinine now 1.6, down from 2.14 during this hospitalization Continuing to monitor renal function with serial chemistries Paroxysmal atrial fibrillation (HCC) Patient exhibited extremely rapid rates this morning, intermittently between fibrillation and flutter.  After discussion with cardiology, patient was initiated on amiodarone  which has proven to be effective Transitioning patient from heparin  infusion to Lovenox  due to limited intravenous access.  Continued poor rate control despite transition from diltiazem  to scheduled intravenous metoprolol   Digoxin  discontinued Metoprolol  50 mg twice daily Cardiology following TSH unremarkable.  Echocardiogram reveals preserved ejection fraction with no significant valvular abnormality  Acute metabolic encephalopathy In the hospitalization patient exhibited bouts of confusion and agitation as of late in the setting of prolonged hospitalization and acute illness This is to have since resolved If confusion recurs, would first recommend minimizing noxious stimuli by minimizing medical interventions If patient continues to exhibit bouts of confusion despite this intervention would try to get patient to commit to a regular sleep cycle with melatonin nightly Finally, if some degree of chemical sedation must be used then extremely low-dose Haldol can be effective when used sparingly if QTc is acceptable Normocytic anemia No current clinical evidence of bleeding Hemoglobin now stable Considering near 3 g drop in hemoglobin over the past 3 weeks will obtain iron panel, folate, vitamin B12 CAD s/p CABG Back on statin therapy now the patient is tolerating oral intake  We will inquire with cardiology about resumption of aspirin  Chest pain-free Monitoring patient on telemetry NSVT (nonsustained ventricular  tachycardia), 9 beats 12/23 No recent episodes Hyperlipidemia Resumed statin therapy Incarcerated incisional hernia Status post  operative intervention as noted above Postoperative management per general surgery Parastomal hernia of ileal conduit Status post operative intervention as noted above Postoperative management per general surgery Protein-calorie malnutrition, severe (HCC) Due to improving ileus, attempting to advance diet today, management per general surgery Pharmacy consult additionally placed for TPN recommendation GOUT Allopurinol  resumed     Subjective:  Patient currently denying any abdominal pain or chest pain.  Patient denies shortness of breath.  Wife reports no further episodes of confusion.    Physical Exam:  Vitals:   01/05/24 1900 01/05/24 1930 01/05/24 1940 01/05/24 1952  BP:  (!) 132/27 (!) 163/82   Pulse: (!) 55 85 81   Resp: (!) 22 (!) 23 (!) 23   Temp:    97.9 F (36.6 C)  TempSrc:    Oral  SpO2: 96% 96% 97%   Weight:      Height:        Constitutional: Awake alert and oriented x3, no associated distress.   Skin: no rashes, no lesions, poor skin turgor noted. Eyes: Pupils are equally reactive to light.  No evidence of scleral icterus or conjunctival pallor.  ENMT: Moist mucous membranes noted.  Posterior pharynx clear of any exudate or lesions.   Respiratory: clear to auscultation bilaterally, no wheezing, no crackles. Normal respiratory effort. No accessory muscle use.  Cardiovascular: Tachycardic and irregularly irregular.  No murmurs / rubs / gallops. No extremity edema. 2+ pedal pulses. No carotid bruits.  Abdomen: Soft abdomen.  Normal active bowel sounds.  Ostomy revealing pink mucosa with no evidence of blood. Musculoskeletal: No joint deformity upper and lower extremities. Good ROM, no contractures. Normal muscle tone.    Data Reviewed:  I have personally reviewed and interpreted labs, imaging.  Significant findings are    CBC: Recent Labs  Lab 12/31/23 0425 01/01/24 0402 01/02/24 0408 01/03/24 0309 01/05/24 0818  WBC 11.4* 10.9* 8.3 8.4 7.5  HGB 10.6* 11.5* 9.4* 10.0* 10.9*  HCT 32.1* 35.7* 28.5* 31.6* 34.7*  MCV 97.3 97.5 96.9 98.1 100.6*  PLT 153 177 169 207 255   Basic Metabolic Panel: Recent Labs  Lab 01/01/24 0402 01/01/24 0903 01/02/24 0408 01/03/24 0309 01/04/24 0428 01/04/24 0429 01/05/24 0818  NA  --  134* 136 136  --  140 142  K 5.0 4.9 4.1 3.7  --  3.6 4.3  CL  --  106 107 107  --  108 112*  CO2  --  20* 20* 21*  --  21* 22  GLUCOSE  --  142* 134* 120*  --  141* 127*  BUN  --  40* 40* 41*  --  53* 50*  CREATININE 2.10* 2.14* 1.94* 1.68*  --  1.69* 1.60*  CALCIUM   --  8.0* 8.3* 8.2*  --  8.1* 8.1*  MG 2.1  --  2.2  --  2.5*  --  2.2  PHOS  --   --   --   --  3.1  --  2.7   GFR: Estimated Creatinine Clearance: 41.9 mL/min (A) (by C-G formula based on SCr of 1.6 mg/dL (H)). Liver Function Tests: Recent Labs  Lab 01/01/24 0903 01/02/24 0408 01/05/24 0818  AST 21 19 17   ALT 7 8 12   ALKPHOS 58 47 50  BILITOT 0.6 0.8 0.7  PROT 5.5* 5.8* 5.9*  ALBUMIN  2.4* 3.2* 2.9*    Coagulation Profile: Recent Labs  Lab 01/01/24 1302  INR 1.2     Telemetry: Personally reviewed.  Rhythm is rapid  atrial fibrillation with heart rate of 110 bpm.  No dynamic ST segment changes appreciated.   Code Status:  Full code.  Code status decision has been confirmed with: patient Family Communication: Plan of care discussed with wife at the bedside   Severity of Illness:  The appropriate patient status for this patient is INPATIENT. Inpatient status is judged to be reasonable and necessary in order to provide the required intensity of service to ensure the patient's safety. The patient's presenting symptoms, physical exam findings, and initial radiographic and laboratory data in the context of their chronic comorbidities is felt to place them at high risk for further clinical  deterioration. Furthermore, it is not anticipated that the patient will be medically stable for discharge from the hospital within 2 midnights of admission.   * I certify that at the point of admission it is my clinical judgment that the patient will require inpatient hospital care spanning beyond 2 midnights from the point of admission due to high intensity of service, high risk for further deterioration and high frequency of surveillance required.*  Time spent:  60 minutes  Author:  Zachary JINNY Ba MD  01/05/2024 8:27 PM

## 2024-01-05 NOTE — Plan of Care (Signed)

## 2024-01-05 NOTE — Assessment & Plan Note (Signed)
 Status post operative intervention as noted above Postoperative management per general surgery

## 2024-01-05 NOTE — Assessment & Plan Note (Signed)
 No current clinical evidence of bleeding Hemoglobin now stable Considering near 3 g drop in hemoglobin over the past 3 weeks will obtain iron panel, folate, vitamin B12

## 2024-01-05 NOTE — Assessment & Plan Note (Signed)
 Allopurinol resumed

## 2024-01-05 NOTE — Assessment & Plan Note (Signed)
 Patient exhibited extremely rapid rates this morning, intermittently between fibrillation and flutter.  After discussion with cardiology, patient was initiated on amiodarone  which has proven to be effective Transitioning patient from heparin  infusion to Lovenox  due to limited intravenous access.  Continued poor rate control despite transition from diltiazem  to scheduled intravenous metoprolol   Digoxin  discontinued Metoprolol  50 mg twice daily Cardiology following TSH unremarkable.  Echocardiogram reveals preserved ejection fraction with no significant valvular abnormality

## 2024-01-05 NOTE — Progress Notes (Signed)
 01/05/2024  Hunter [name] [id number] 09-11-1946  CARE TEAM: PCP: Paola Greig CROME, FNP  Outpatient Care Team: Patient Care Team: Paola Greig CROME, FNP as PCP - General (Family Medicine) Croitoru, Jerel, MD as PCP - Cardiology (Cardiology) Croitoru, Jerel, MD as Attending Physician (Cardiology) Mayo, Darice LABOR, FNP as WOC Nurse (Nurse Practitioner) Rogers Hai, MD as Medical Oncologist (Medical Oncology) Alvaro Ricardo KATHEE Raddle., MD as Consulting Physician (Urology) Colon Shove, MD as Consulting Physician (Neurosurgery) Sheldon Standing, MD as Consulting Physician (General Surgery)  Inpatient Treatment Team: Treatment Team:  Sheldon Standing, MD Lbcardiology, Rande, MD Bobbette Life, MD Sheldon Standing, MD Laurice Ernst, [place] Porter Olam HERO, RN Shalhoub, Zachary PARAS, MD Judythe Lorrayne SAILOR, RN Leigh Darice BRAVO, PT   Problem List:   Principal Problem:   Incarcerated incisional hernia Active Problems:   Paroxysmal atrial fibrillation with rapid ventricular response (HCC)   Hyperlipidemia   GOUT   MONONEURITIS OF UNSPECIFIED SITE   Essential hypertension   GERD   CAD s/p CABG   S/P CABG x 4, 12/12/12, LIMA-LAD;VG-OM1,OM2;VG-PD   NSVT (nonsustained ventricular tachycardia), 9 beats 12/23   Port-A-Cath in place   Parastomal hernia of ileal conduit   History of poliomyelitis   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   CKD stage 3b, GFR 30-44 ml/min (HCC)   Acute renal failure superimposed on stage 3b chronic kidney disease (HCC)   Normocytic anemia   Protein-calorie malnutrition, severe (HCC)   Ileus (HCC)   Acute metabolic encephalopathy   12/29/2023  POST-OPERATIVE DIAGNOSIS:   Ventral incisional abdominal wall hernias 13x5cm region - incarcerated Parastomal ileal conduit hernia - incarcerated    PROCEDURE:   ROBOTIC TRANSVERSUS ABDOMINIS RELEASE (TAR) COMPONENT SEPARATION - UNILATERAL ROBOTIC REPAIR OF ABDOMINAL HERNIA WITH MESH  ROBOTIC PARASTOMAL HERNIA  REPAIR OF ILEAL CONDUIT WITH MESH (SUGARBAKER) ROBOTIC LYSIS OD ADHESIONS COLON REPAIR TAP BLOCK - BILATERAL   SURGEON:  Standing KYM Sheldon, MD  OR FINDINGS: Parastomal hernia around ileal conduit incarcerated with omentum.  5 x 4 cm region.  Primarily closed.   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  13 x 5 cm region.  Right sided component separation TAR release done to allow largest 8x5cm defect to be primarily closed   Type of ventral wall repair:  Laparoscopic underlay repair .with Primary repair of largest hernia Placement of mesh: Retrorectus / Preperitoneal underlay repair Name of mesh: Bard Ventralight dual sided (polypropylene / Seprafilm) Size of mesh: 33x27cm Orientation:  Mostly vertical with slightly oblique to right lower quadrant Mesh overlap:  5-7cm   Assessment [facility] Stay = 7 days) 7 Days Post-Op    A-fib rate persistent - rate poorly controlled  Ileus appearing to resolve   Plan:  Tolerating clamping trial in 840 in with only 1025 out and another bowel movement.  X-rays show contrast fully in colon now.  Wife and nurses agree he is doing much better without any complaints of bloating, belching, nor hiccups  I removed the NG tube.    Try full liquid diet.  If things go well and may be gradually advance.  Consider supplemental shakes.  I would keep him on TPN until he is tolerating solid food consistently for more than a day since he has had a rather erratic bowel function for the past week.   Paroxysmal atrial fibrillation.  Most likely due to ileus and inability to absorb beta-blockers, withdrawing with from his home regimen = recurrence.  Tried to do IV control  but issues of hypotension and he is heart rate is very high again today.  He is asymptomatic though.  My instinct is he probably can get started back on his metoprolol  but I am going to defer to cardiology on this since the patient has been challenging.  Hopefully if his  gut is working truly, he can get back to his home regimen and that will help resolve things.  I would keep an on a heparin  drip today.  If he can improve that his rate control is possible with pills only, then switch to oral Eliquis  anticoagulation.  AKI in the setting of chronic kidney disease (creatinine baseline 1.5-1.8) from prior cystoprostatectomy and uretero-ileal urostomy conduit.  Cr peak of 2.1 coming down to baseline range of 1.68.  He is nonoliguric and that seems to be stabilizing.  Keeping on the dry side for now.  Try to avoid dehydration with PRN (as needed) backups.  Follow electrolytes.  Keep potassium above 4 and magnesium  above 2.    No evidence of peritonitis and I doubt any active infection at this time.  Minimal soreness.  If does not get better in 48 hours may do CT scan to rule out abscess or collection.  Much more alert and joking back to his baseline.  No delirium or sundowning.  Low-dose as needed lorazepam  for night just in case.  History of polio with chronic left upper extremity weakness.  Right upper extremity weakness postop solved.  Same with edema.  Have therapies to be involved and follow.  He gives me no other signs of neurological decline so I do not think he is having a stroke or other issue.  Follow closely.  Should improve.  History of gout.  Allopurinol  restarted 1/9   History of allergies.  Continue Flonase   History of hypertension.  Hypotension with his atrial fibrillation so therefore no aggressive treatment.    VTE prophylaxis- SCDs. Okay to do heparin  drip as needed given atrial fibrillation.  -mobilize as tolerated to help recovery   -Disposition:  Disposition: TBD T    I reviewed nursing notes, last 24 h vitals and pain scores, last 48 h intake and output, last 24 h labs and trends, and last 24 h imaging results.  I have reviewed this patient's available data, including medical history, events of note, test results, etc as part of my  evaluation.   A significant portion of that time was spent in counseling. Care during the described time interval was provided by me.  This care required moderate level of medical decision making.  01/05/2024    Subjective: (Chief complaint)  Patient feels much better overall.  No more hiccups.  Had a bowel movement.  Drank a lot of liquids and kept it down.  Net output from NG tube only 200 mL (840 in, 1025 out)  Denies ever having much flatus but does note he had a much better bowel movement.  Denies any abdominal pain or nausea.  Nursing just outside room.  Try to help control heart rate which is up into the 170s this morning despite digoxin  etc.  Objective:  Vital signs:  Vitals:   01/05/24 0620 01/05/24 0621 01/05/24 0622 01/05/24 0700  BP:   (!) 116/93 92/72  Pulse:    (!) 172  Resp: 15 18 (!) 22 (!) 30  Temp:      TempSrc:      SpO2:    99%  Weight:      Height:  Last BM Date : 01/04/24  Intake/Output   Yesterday:  01/08 0701 - 01/09 0700 In: 1641 [P.O.:840; I.V.:606.4; IV Piggyback:194.6] Out: 1776 [Urine:750; Emesis/NG output:1025; Stool:1] This shift:  No intake/output data recorded.  Bowel function:  Flatus:  scant  BM:  YES  Drain: NG tube:  Very thin with light green bile tinge   Physical Exam:  General: Pt awake/alert in no acute distress.  Alert and chatty and joking.   Eyes: PERRL, normal EOM.  Sclera clear.  No icterus Neuro: CN II-XII intact w/o focal sensory/motor deficits.  Chronic left upper extremity weakness this is baseline.  Right upper extremity back to only mildly decreased function/active range of motion.  No problem with passive motion though.  Good handgrip. Lymph: No head/neck/groin lymphadenopathy Psych:  No delerium/psychosis/paranoia.  Oriented x 4 HENT: Normocephalic, Mucus membranes moist.  No thrush Neck: Supple, No tracheal deviation.  No obvious thyromegaly Chest: No pain to chest wall compression.  Good  respiratory excursion.  No audible wheezing CV:  Pulses intact.  Regular rhythm.  No major extremity edema MS: Normal AROM mjr joints.  No obvious deformity  Abdomen: Soft.  Mildy distended.  Incisions clean dry and intact.  No cellulitis.  Nontender.  No guarding to cough or bed shake.  No rebound or percussive tenderness.  No evidence of peritonitis.  Right lower quadrant ileal conduit urostomy pink with clear light yellow urine in bag  Ext:   No deformity.  Edema resolved.  No cyanosis Skin: No petechiae / purpurea.  No major sores.  Warm and dry    Results:   Cultures: Recent Results (from the past 720 hours)  MRSA Next Gen by PCR, Nasal     Status: None   Collection Time: 01/03/24 11:39 AM   Specimen: Nasal Mucosa; Nasal Swab  Result Value Ref Range Status   MRSA by PCR Next Gen NOT DETECTED NOT DETECTED Final    Comment: (NOTE) The GeneXpert MRSA Assay (FDA approved for NASAL specimens only), is one component of a comprehensive MRSA colonization surveillance program. It is not intended to diagnose MRSA infection nor to guide or monitor treatment for MRSA infections. Test performance is not FDA approved in patients less than 25 years old. Performed at [facility], [address] [place], [place] [address]     Labs: Results for orders placed or performed during the hospital encounter of 12/29/23 (from the past 48 hours)  MRSA Next Gen by PCR, Nasal     Status: None   Collection Time: 01/03/24 11:39 AM   Specimen: Nasal Mucosa; Nasal Swab  Result Value Ref Range   MRSA by PCR Next Gen NOT DETECTED NOT DETECTED    Comment: (NOTE) The GeneXpert MRSA Assay (FDA approved for NASAL specimens only), is one component of a comprehensive MRSA colonization surveillance program. It is not intended to diagnose MRSA infection nor to guide or monitor treatment for MRSA infections. Test performance is not FDA approved in patients less than 37 years old. Performed  at [facility], [address] [place], [place] [address]   Magnesium      Status: Abnormal   Collection Time: 01/04/24  4:28 AM  Result Value Ref Range   Magnesium  2.5 (H) 1.7 - 2.4 mg/dL    Comment: Performed at [facility], [address] [place], [place] [address]  Phosphorus     Status: None   Collection Time: 01/04/24  4:28 AM  Result Value Ref Range  Phosphorus 3.1 2.5 - 4.6 mg/dL    Comment: Performed at [facility], [address], [place], [place] [address]  Basic metabolic panel     Status: Abnormal   Collection Time: 01/04/24  4:29 AM  Result Value Ref Range   Sodium 140 135 - 145 mmol/L   Potassium 3.6 3.5 - 5.1 mmol/L   Chloride 108 98 - 111 mmol/L   CO2 21 (L) 22 - 32 mmol/L   Glucose, Bld 141 (H) 70 - 99 mg/dL    Comment: Glucose reference range applies only to samples taken after fasting for at least 8 hours.   BUN 53 (H) 8 - 23 mg/dL   Creatinine, Ser 8.30 (H) 0.61 - 1.24 mg/dL   Calcium  8.1 (L) 8.9 - 10.3 mg/dL   GFR, Estimated 41 (L) >60 mL/min    Comment: (NOTE) Calculated using the CKD-EPI Creatinine Equation (2021)    Anion gap 11 5 - 15    Comment: Performed at [facility], [address] [place], [place] [address]  Heparin  level (unfractionated)     Status: Abnormal   Collection Time: 01/04/24  4:18 PM  Result Value Ref Range   Heparin  Unfractionated >1.10 (H) 0.30 - 0.70 IU/mL    Comment: (NOTE) The clinical reportable range upper limit is being lowered to >1.10 to align with the FDA approved guidance for the current laboratory assay.  If heparin  results are below expected values, and patient dosage has  been confirmed, suggest follow up testing of antithrombin III levels. Performed at [facility], [address] [place], [place] [address]   Heparin  level (unfractionated)     Status: Abnormal   Collection Time: 01/04/24  8:31 PM  Result Value  Ref Range   Heparin  Unfractionated >1.10 (H) 0.30 - 0.70 IU/mL    Comment: (NOTE) The clinical reportable range upper limit is being lowered to >1.10 to align with the FDA approved guidance for the current laboratory assay.  If heparin  results are below expected values, and patient dosage has  been confirmed, suggest follow up testing of antithrombin III levels. Performed at [facility], [address] [place], [place] [address]   Glucose, capillary     Status: Abnormal   Collection Time: 01/05/24 12:06 AM  Result Value Ref Range   Glucose-Capillary 128 (H) 70 - 99 mg/dL    Comment: Glucose reference range applies only to samples taken after fasting for at least 8 hours.   Comment 1 Notify RN    Comment 2 Document in Chart   Glucose, capillary     Status: Abnormal   Collection Time: 01/05/24  5:09 AM  Result Value Ref Range   Glucose-Capillary 109 (H) 70 - 99 mg/dL    Comment: Glucose reference range applies only to samples taken after fasting for at least 8 hours.   Comment 1 Notify RN    Comment 2 Document in Chart     Imaging / Studies: DG Abd Portable 1V-Small Bowel Obstruction Protocol-24 hr delay Result Date: 01/04/2024 CLINICAL DATA:  NG tube placement, 24 hour contrast delay EXAM: PORTABLE ABDOMEN - 1 VIEW COMPARISON:  01/03/2024 4:42 p.m. FINDINGS: Enteric contrast is present throughout the colon to the rectum. Mildly gas distended loops of small bowel present in the left hemiabdomen, measuring up to 4.8 cm in caliber. No obvious free air. No enteric tube identified on this examination, which excludes the upper abdomen and hemidiaphragms. No acute osseous findings. IMPRESSION:  1. Enteric contrast is present throughout the colon to the rectum. 2. Mildly gas distended loops of small bowel present in the left hemiabdomen, measuring up to 4.8 cm in caliber. 3. No enteric tube identified on this examination, which excludes the upper abdomen and hemidiaphragms.  Electronically Signed   By: Marolyn JONETTA Jaksch M.D.   On: 01/04/2024 12:47   US  EKG SITE RITE Result Date: 01/04/2024 If Site Rite image not attached, placement could not be confirmed due to current cardiac rhythm.  DG Abd Portable 1V-Small Bowel Obstruction Protocol-initial, 8 hr delay Result Date: 01/03/2024 CLINICAL DATA:  8 hour small-bowel follow-up film EXAM: PORTABLE ABDOMEN - 1 VIEW COMPARISON:  01/01/2024 FINDINGS: Gastric catheter is noted within the stomach. Administered contrast now lie is primarily within the mildly dilated small bowel. Some colonic contrast is seen indicating a partial small bowel obstruction. IMPRESSION: Dilated small bowel with contrast within. Some contrast reaches stomach consistent with a partial small bowel obstruction. Electronically Signed   By: Oneil Devonshire M.D.   On: 01/03/2024 19:45    Medications / Allergies: per chart  Antibiotics: Anti-infectives (From admission, onward)    Start     Dose/Rate Route Frequency Ordered Stop   12/29/23 1600  ceFAZolin  (ANCEF ) IVPB 2g/100 mL premix        2 g 200 mL/hr over 30 Minutes Intravenous Every 8 hours 12/29/23 1401 12/30/23 0128   12/29/23 1600  metroNIDAZOLE  (FLAGYL ) IVPB 500 mg        500 mg 100 mL/hr over 60 Minutes Intravenous Every 8 hours 12/29/23 1401 12/30/23 0844   12/29/23 0600  ceFAZolin  (ANCEF ) IVPB 2g/100 mL premix       Placed in And Linked Group   2 g 200 mL/hr over 30 Minutes Intravenous On call to O.R. 12/29/23 9470 12/29/23 0734   12/29/23 0600  metroNIDAZOLE  (FLAGYL ) IVPB 500 mg       Placed in And Linked Group   500 mg 100 mL/hr over 60 Minutes Intravenous On call to O.R. 12/29/23 0529 12/29/23 0746         Note: Portions of this report may have been transcribed using voice recognition software. Every effort was made to ensure accuracy; however, inadvertent computerized transcription errors may be present.   Any transcriptional errors that result from this process are  unintentional.    Elspeth KYM Schultze, MD, FACS, MASCRS Esophageal, Gastrointestinal & Colorectal Surgery Robotic and Minimally Invasive Surgery  [facility] [address] [place], [place] [address] [phone] Fax [phone] Main  CONTACT INFORMATION: Weekday (9AM-5PM): Call [facility] main office at [phone] Weeknight (5PM-9AM) or Weekend/Holiday: Check EPIC Web Links tab & use AMION (password  [email or web address]) for General Surgery [facility] coverage  Please, DO NOT use SecureChat  (it is not reliable communication to reach operating surgeons & will lead to a delay in care).   Epic staff messaging available for outptient concerns needing 1-2 business day response.      01/05/2024  7:17 AM

## 2024-01-05 NOTE — Assessment & Plan Note (Signed)
 Baseline creatinine 1.5-1.7 Creatinine now 1.6, down from 2.14 during this hospitalization Continuing to monitor renal function with serial chemistries

## 2024-01-05 NOTE — Assessment & Plan Note (Signed)
 In the hospitalization patient exhibited bouts of confusion and agitation as of late in the setting of prolonged hospitalization and acute illness This is to have since resolved If confusion recurs, would first recommend minimizing noxious stimuli by minimizing medical interventions If patient continues to exhibit bouts of confusion despite this intervention would try to get patient to commit to a regular sleep cycle with melatonin nightly Finally, if some degree of chemical sedation must be used then extremely low-dose Haldol can be effective when used sparingly if QTc is acceptable

## 2024-01-05 NOTE — Progress Notes (Signed)
 TPN stopped and patient made NPO at this time per Cyndi Bender, cardiology NP. Patient needed port for amio bolus and infusion d/t HR sustaining in 170's with hypotension. Heparin gtt and amio are incompatible. Dr Lennie Odor and Dr Michaell Cowing notified by this RN.

## 2024-01-05 NOTE — Progress Notes (Signed)
 Patient Name: Hunter [name] Date of Encounter: 01/05/2024 [facility] Cardiologist: Jerel Balding, MD   Interval Summary  .    78 year old male with PMH of CABG 2013 with LIMA to LAD, SVG to OM1-OM2, SVG to PDA, paroxysmal atrial fibrillation (postop CABG and recurrence 10/2020 in the setting of critical illness), hypertension, hyperlipidemia, CKD stage III, polio with chronic left upper extremity weakness, bladder cancer status post TURBT/2021 and chemotherapy, cardiology is following for atrial fibrillation RVR, currently admitted for postop ileus following robotic assisted ventral incisional abdominal wall hernia and parastomal hernia repair on 12/29/2023.   Patient is examined urgently this morning due to tachycardia sustaining at 180s.  His NG tube has been removed and has been tolerating liquid diet.  He denied any nausea or vomiting since Saturday, denied passing any gas, denied any abdominal pain.  He states he is feeling great, denied any chest pain, palpitation, dizziness, syncope.  He complained that his blood vessel are frail since his cardiac surgery which limits blood draw and IV access.   Vital Signs .    Vitals:   01/05/24 0621 01/05/24 0622 01/05/24 0700 01/05/24 0800  BP:  (!) 116/93 92/72 112/79  Pulse:   (!) 172   Resp: 18 (!) 22 (!) 30 19  Temp:      TempSrc:      SpO2:   99% 98%  Weight:      Height:        Intake/Output Summary (Last 24 hours) at 01/05/2024 0834 Last data filed at 01/05/2024 0813 Gross per 24 hour  Intake 1641.01 ml  Output 2151 ml  Net -509.99 ml      01/05/2024    3:41 AM 12/29/2023    5:30 AM 12/14/2023   10:58 AM  Last 3 Weights  Weight (lbs) 188 lb 7.9 oz 181 lb 181 lb  Weight (kg) 85.5 kg 82.101 kg 82.101 kg      Telemetry/ECG    Converted from atrial fibrillation to atrial flutter with RVR this morning sustaining 170-180s- Personally Reviewed  Physical Exam .   GEN: No acute distress.  Laying in bed not toxic  appearing.  Alert and oriented x 3.  Maintaining conversation appropriately.  Follows commands appropriately.  No gross focal neurodeficit Neck: No JVD Cardiac: Regular rhythm, tachycardic, no murmur Respiratory: Clear to auscultation bilaterally.  On room air, speak full sentence GI: Abdominal mildly distended, soft, nontender MS: No leg edema noted Right chest port running TPN Left forearm IV running heparin  gtt  Assessment & Plan .     Paroxysmal atrial fibrillation Paroxysmal atrial flutter RVR -Called to the bedside urgently this morning due to sustained tachycardia to 180s, telemetry reviewed appears to be consistent with atrial flutter with RVR 180s, blood pressure borderline soft 86-114 systolic, he is neurologically intact, no signs of CHF decompensation, and denied any shortness of breath, chest pain, heart palpitation, dizzy, syncope -He has received digoxin  loading since yesterday, blood pressure has been low which limiting the use of AV nodal blocking agent, currently IV metoprolol  5 mg every 6 hours and received IV digoxin  0.125 mg x3 doses  -Given he is hemodynamically stable, will attempt to chemically convert with amiodarone .  Lengthy discussion occurred at bedside with patient and his wife, they do not recall significant allergic/anaphylactic reaction to amiodarone , wife recalls patient had hallucination when he was acutely ill after his CABG in the ICU and received oxycodone  and amiodarone .  Amiodarone  therefore has been listed as intolerant  drug for him due to hallucination .  Given significant tachyarrhythmia, benefit outweighs risk using amiodarone , patient and wife agreed to retry amiodarone  for rate/rhythm control.  If this is not effective or patient become hemodynamically unstable, will proceed with TEE/DCCV.  If hallucination reoccurs, will stop amiodarone .  - He has very limited IV access, discussed with primary team to hold TPN, use Chemo-Port for 1 L normal saline bolus  and amiodarone  load,  while continue IV heparin  through peripheral IV access.  If PICC line is not an option, consider insert CVL. -Continue anticoagulation with heparin  gtt due to elevated CHA2DS2-VASc score of 4 and potential for cardioversion, ultimately will need to transition to Eliquis  upon surgery clearance -Labs pending this morning, keep magnesium  above 2 and potassium above 4, transfuse to keep hemoglobin above 7  CAD with history of CABG -No angina symptoms -May resume PTA simvastatin  and metoprolol  when p.o. intake is allowed, may stop aspirin  given initiation of anticoagulation with Eliquis       For questions or updates, please contact [facility] Please consult [email or web address] for contact info under        Signed, Biance Moncrief, NP

## 2024-01-05 NOTE — Progress Notes (Signed)
 PT Cancellation Note  Patient Details Name: Hunter [name] MRN: [id number] DOB: [birth year]   Cancelled Treatment:    Reason Eval/Treat Not Completed: Medical issues which prohibited therapy  HR has been elevated. Hold  per RN.  Darice Potters PT Acute Rehabilitation Services Office [phone] Weekend pager-[phone]  Potters Darice Norris 01/05/2024, 2:06 PM

## 2024-01-05 NOTE — Assessment & Plan Note (Signed)
 Management per general surgery TPN was initiated on 1/8 but with what seems to be improving ileus today diet has been advanced to a soft diet. If patient tolerates soft diet well, TPN may likely be able to be discontinued.

## 2024-01-05 NOTE — Assessment & Plan Note (Signed)
 Back on statin therapy now the patient is tolerating oral intake  We will inquire with cardiology about resumption of aspirin Chest pain-free Monitoring patient on telemetry

## 2024-01-05 NOTE — Progress Notes (Signed)
 Dr Raford notified of HR sustaining in 170's after Digoxin  given. BP stable at 113/84. Per Dr Raford, her partner will be headed to assess him shortly. Cardiology has already reviewed EKG. This RN will continue to carefully monitor patient for changes in hemodynamic status.

## 2024-01-05 NOTE — Progress Notes (Signed)
 PHARMACY - ANTICOAGULATION CONSULT NOTE  Pharmacy Consult for heparin  Indication: atrial fibrillation  Allergies  Allergen Reactions   Amiodarone      Hallucinations    Oxycodone  Other (See Comments)    Bad dreams and hallucinations    Patient Measurements: Height: 5' 9 (175.3 cm) Weight: 85.5 kg (188 lb 7.9 oz) IBW/kg (Calculated) : 70.7 Heparin  Dosing Weight: 82.1  Vital Signs: Temp: 98 F (36.7 C) (01/09 0400) Temp Source: Axillary (01/09 0400) BP: 130/67 (01/09 1000) Pulse Rate: 90 (01/09 1000)  Labs: Recent Labs    01/03/24 0309 01/04/24 0429 01/04/24 1618 01/04/24 2031 01/05/24 0818  HGB 10.0*  --   --   --  10.9*  HCT 31.6*  --   --   --  34.7*  PLT 207  --   --   --  255  HEPARINUNFRC  --   --  >1.10* >1.10* 0.19*  CREATININE 1.68* 1.69*  --   --  1.60*  TROPONINIHS  --   --   --   --  10    Estimated Creatinine Clearance: 41.9 mL/min (A) (by C-G formula based on SCr of 1.6 mg/dL (H)).   Medical History: Past Medical History:  Diagnosis Date   Atrial fibrillation, rapid (HCC)    With high ventricular rates. Intol amio, was on Sotalol , now on BB   Bladder cancer (HCC) dx'd 10/2019   Bursitis of right shoulder    CAD (coronary artery disease) 11/2012   CABG x 4 using left internal mammary artery and right endovein harvest. (LIMA-LAD; VG-OM1, OM2, VG-PD), EF 55-60% at cath   Degenerative disc disease, lumbar    Dysrhythmia    Epigastric pain    Myoview 11/09/12 Showed evidence of reversible ischemia in both the inferior wall & the anteroapical distribution.    Erectile dysfunction    H/O ED while taking a beta-blocker   GERD (gastroesophageal reflux disease)    Gout    Hiatal hernia    History of kidney stones    Hyperlipidemia    Hypertension    Systemic HTN. Pt has H/O of ED when taking a beta-blocker. ECHO 11/26/08 Showed no evidence of any significant valvular disease, Estimated EF = 50-60%.   Mononeuritis of unspecified site    Nocturia     NSVT (nonsustained ventricular tachycardia) (HCC)    Obesity    Osteoarthritis 08/2019   right knee; cortisone shot    PAF (paroxysmal atrial fibrillation) (HCC)    Post CABG. Amia intol. 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   Past Week   allopurinol  (ZYLOPRIM ) 300 MG tablet Take 300 mg by mouth daily.   12/28/2023   ferrous sulfate  325 (65 FE) MG tablet Take 325 mg by mouth 3 (three) times a week.   12/28/2023   fluticasone  (FLONASE ) 50 MCG/ACT nasal spray Place 2 sprays into both nostrils daily as needed for allergies.   12/28/2023 Morning   metoprolol  tartrate (LOPRESSOR )  50 MG tablet Take 1 tablet (50 mg total) by mouth 2 (two) times daily. 180 tablet 3 12/29/2023 at  3:30 AM   simvastatin  (ZOCOR ) 40 MG tablet TAKE 1 TABLET BY MOUTH EVERYDAY AT BEDTIME 90 tablet 3 12/28/2023    Assessment: 78 yo M s/p cystoprostatectomy with ileal, incisional and parastomal ileal conduit hernia repairs on 12/29/2023.  Pharmacy consulted to dose heparin  for Afib. No anticoagulants PTA.   01/05/2024: Heparin  level now SUBtherapeutic on current heparin  rate of 900 units/hr CBC: Hgb and plts stable No bleeding or infusion related concerns reported by RN Of note, patient went into sustained tachycardia this AM consistent with Aflutter and started on amiodarone . Patient with limited IV access, only with 1 peripheral site along with chemo-port that was running TPN. IV heparin  and amiodarone  are not compatible so IV heparin  was continued in peripheral IV site and TPN  was stopped and amiodarone  started via chemo-port due to urgent need. Plan is for patient to get PICC if able  Goal of Therapy:  Heparin  level 0.3-0.7 units/ml Monitor platelets by anticoagulation protocol: Yes   Plan:  Increase IV heparin  from 900 to 1000 units/hr Recheck heparin  level in 8 hours Daily CBC & heparin  level Plan to transition to Eliquis  once gut working when ok per surgery   Eva CHRISTELLA Allis, PharmD, BCPS Secure Chat if ?s 01/05/2024 10:07 AM

## 2024-01-05 NOTE — Plan of Care (Signed)
  Problem: Clinical Measurements: Goal: Respiratory complications will improve Outcome: Progressing Goal: Cardiovascular complication will be avoided Outcome: Progressing   Problem: Nutrition: Goal: Adequate nutrition will be maintained Outcome: Progressing   Problem: Coping: Goal: Level of anxiety will decrease Outcome: Progressing   Problem: Elimination: Goal: Will not experience complications related to bowel motility Outcome: Progressing   Problem: Pain Management: Goal: General experience of comfort will improve Outcome: Progressing   Problem: Skin Integrity: Goal: Risk for impaired skin integrity will decrease Outcome: Progressing

## 2024-01-06 DIAGNOSIS — I480 Paroxysmal atrial fibrillation: Secondary | ICD-10-CM | POA: Diagnosis not present

## 2024-01-06 DIAGNOSIS — K430 Incisional hernia with obstruction, without gangrene: Secondary | ICD-10-CM | POA: Diagnosis not present

## 2024-01-06 DIAGNOSIS — K56609 Unspecified intestinal obstruction, unspecified as to partial versus complete obstruction: Secondary | ICD-10-CM

## 2024-01-06 DIAGNOSIS — I4729 Other ventricular tachycardia: Secondary | ICD-10-CM | POA: Diagnosis not present

## 2024-01-06 DIAGNOSIS — G9341 Metabolic encephalopathy: Secondary | ICD-10-CM | POA: Diagnosis not present

## 2024-01-06 LAB — PREALBUMIN: Prealbumin: 12 mg/dL — ABNORMAL LOW (ref 18–38)

## 2024-01-06 LAB — CBC
HCT: 34.8 % — ABNORMAL LOW (ref 39.0–52.0)
Hemoglobin: 10.9 g/dL — ABNORMAL LOW (ref 13.0–17.0)
MCH: 31.1 pg (ref 26.0–34.0)
MCHC: 31.3 g/dL (ref 30.0–36.0)
MCV: 99.1 fL (ref 80.0–100.0)
Platelets: 305 10*3/uL (ref 150–400)
RBC: 3.51 MIL/uL — ABNORMAL LOW (ref 4.22–5.81)
RDW: 14.4 % (ref 11.5–15.5)
WBC: 10.3 10*3/uL (ref 4.0–10.5)
nRBC: 0 % (ref 0.0–0.2)

## 2024-01-06 LAB — GLUCOSE, CAPILLARY
Glucose-Capillary: 102 mg/dL — ABNORMAL HIGH (ref 70–99)
Glucose-Capillary: 96 mg/dL (ref 70–99)

## 2024-01-06 LAB — COMPREHENSIVE METABOLIC PANEL WITH GFR
ALT: 22 U/L (ref 0–44)
AST: 37 U/L (ref 15–41)
Albumin: 3 g/dL — ABNORMAL LOW (ref 3.5–5.0)
Alkaline Phosphatase: 57 U/L (ref 38–126)
Anion gap: 7 (ref 5–15)
BUN: 43 mg/dL — ABNORMAL HIGH (ref 8–23)
CO2: 20 mmol/L — ABNORMAL LOW (ref 22–32)
Calcium: 8.3 mg/dL — ABNORMAL LOW (ref 8.9–10.3)
Chloride: 111 mmol/L (ref 98–111)
Creatinine, Ser: 1.44 mg/dL — ABNORMAL HIGH (ref 0.61–1.24)
GFR, Estimated: 50 mL/min — ABNORMAL LOW
Glucose, Bld: 111 mg/dL — ABNORMAL HIGH (ref 70–99)
Potassium: 4 mmol/L (ref 3.5–5.1)
Sodium: 138 mmol/L (ref 135–145)
Total Bilirubin: 0.9 mg/dL (ref 0.0–1.2)
Total Protein: 6.3 g/dL — ABNORMAL LOW (ref 6.5–8.1)

## 2024-01-06 MED ORDER — MAGIC MOUTHWASH: 15.0000 mL | Freq: Four times a day (QID) | ORAL | Status: DC | PRN

## 2024-01-06 MED ORDER — PANTOPRAZOLE SODIUM 40 MG PO TBEC
40.0000 mg | DELAYED_RELEASE_TABLET | Freq: Every day | ORAL | Status: DC
  Administered 2024-01-06 – 2024-01-07 (×2): 40 mg via ORAL
  Filled 2024-01-06 (×2): qty 1

## 2024-01-06 MED ORDER — FERROUS SULFATE 325 (65 FE) MG PO TABS
325.0000 mg | ORAL_TABLET | Freq: Every day | ORAL | Status: DC
  Administered 2024-01-07 – 2024-01-08 (×2): 325 mg via ORAL
  Filled 2024-01-06 (×2): qty 1

## 2024-01-06 MED ORDER — AMIODARONE HCL 200 MG PO TABS
200.0000 mg | ORAL_TABLET | Freq: Two times a day (BID) | ORAL | Status: DC
  Administered 2024-01-06 – 2024-01-08 (×5): 200 mg via ORAL
  Filled 2024-01-06 (×5): qty 1

## 2024-01-06 MED ORDER — MAGIC MOUTHWASH
15.0000 mL | Freq: Three times a day (TID) | ORAL | Status: AC
  Administered 2024-01-06: 15 mL via ORAL
  Filled 2024-01-06 (×2): qty 15

## 2024-01-06 MED ORDER — HYDROMORPHONE HCL 1 MG/ML IJ SOLN
0.5000 mg | INTRAMUSCULAR | Status: DC | PRN
Start: 2024-01-06 — End: 2024-01-08

## 2024-01-06 MED ORDER — THIAMINE MONONITRATE 100 MG PO TABS
100.0000 mg | ORAL_TABLET | Freq: Every day | ORAL | Status: AC
  Administered 2024-01-06 – 2024-01-08 (×3): 100 mg via ORAL
  Filled 2024-01-06 (×3): qty 1

## 2024-01-06 MED ORDER — ATORVASTATIN CALCIUM 20 MG PO TABS
20.0000 mg | ORAL_TABLET | Freq: Every day | ORAL | Status: DC
  Administered 2024-01-06 – 2024-01-07 (×2): 20 mg via ORAL
  Filled 2024-01-06 (×2): qty 1

## 2024-01-06 MED ORDER — AMIODARONE HCL 200 MG PO TABS: 200.0000 mg | ORAL_TABLET | Freq: Every day | ORAL | Status: DC

## 2024-01-06 MED ORDER — APIXABAN 5 MG PO TABS
5.0000 mg | ORAL_TABLET | Freq: Two times a day (BID) | ORAL | Status: DC
  Administered 2024-01-06 – 2024-01-08 (×5): 5 mg via ORAL
  Filled 2024-01-06 (×5): qty 1

## 2024-01-06 NOTE — Plan of Care (Signed)
   Problem: Education: Goal: Knowledge of General Education information will improve Description Including pain rating scale, medication(s)/side effects and non-pharmacologic comfort measures Outcome: Progressing

## 2024-01-06 NOTE — Assessment & Plan Note (Addendum)
 Baseline creatinine 1.5-1.7 Creatinine now 1.44, down from 2.14 during this hospitalization Continuing to monitor renal function with serial chemistries

## 2024-01-06 NOTE — Assessment & Plan Note (Signed)
 No recent episodes

## 2024-01-06 NOTE — Assessment & Plan Note (Addendum)
 Patient has cardioverted overnight since the initiation of amiodarone  1/9.  Theology now recommending transitioning patient from intravenous amiodarone  infusion to oral load.  The anticipate patient will be on amiodarone  for 1 month with eventual discontinuation as an outpatient. Patient is being transitioned to Eliquis  for likely lifelong anticoagulation for CHA2DS2-VASc of 4. Digoxin  discontinued Metoprolol  50 mg twice daily Cardiology following TSH unremarkable.  Echocardiogram reveals preserved ejection fraction with no significant valvular abnormality

## 2024-01-06 NOTE — Progress Notes (Signed)
 Report called to Rodney Booze, 4E RN. All questions answered at this time. All patient belongings and paper chart transported with patient. Patient was transported upstairs in the wheelchair by NT.

## 2024-01-06 NOTE — Assessment & Plan Note (Signed)
 Back on statin therapy now the patient is tolerating oral intake  We will inquire with cardiology about resumption of aspirin Chest pain-free Monitoring patient on telemetry

## 2024-01-06 NOTE — Assessment & Plan Note (Addendum)
 Advancing diet, encouraging oral intake. TPN discontinued.

## 2024-01-06 NOTE — Assessment & Plan Note (Addendum)
 Resumed statin therapy with atorvastatin 20 mg daily.

## 2024-01-06 NOTE — Progress Notes (Signed)
 Occupational Therapy Treatment Patient Details Name: Hunter [name] MRN: [id number] DOB: 1946-05-22 Today's Date: 01/06/2024   History of present illness 78 yo male presents to therapy following hospital admission on 12/28/2022 for abdominal hernia repair. 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   OT comments  Patient progressing, despite therapy holding yesterday due to A-fib per spouse. Pt showed improved RUE AROM compared to previous session, but fatigues quickly and changes to compensatory movement patterns with need of cues and coaching to promote proper GH joint and scapular gliding.  Pt was able to increase amount of RUE exercises in sidelying and supine positions and required less assistance to perform exercises than during last session.  Patient remains limited by generalized weakness and decreased activity tolerance along with deficits noted below. Pt continues to demonstrate very good rehab potential and would benefit from continued skilled OT to increase safety and independence with ADLs and functional transfers to allow pt to return home safely and reduce caregiver burden and fall risk.       If plan is discharge home, recommend the following:  A little help with walking and/or transfers;A little help with bathing/dressing/bathroom;A lot of help with bathing/dressing/bathroom;Assistance with cooking/housework;Assist for transportation;Help with stairs or ramp for entrance;Supervision due to cognitive status   Equipment Recommendations  None recommended by OT    Recommendations for Other Services      Precautions / Restrictions Precautions Precautions: Fall Precaution Comments: L U weakness-chronic from polio as a baby, monitor HR Restrictions Weight Bearing Restrictions Per Provider Order: No       Mobility Bed Mobility      Rolling: Supervision         General bed mobility comments: Pt recevied in supine and able to roll to LT side to allow OT to mobilize RT scapula. Pt rolled back to supine for RUE exercises with scapular stabuilization provided by mattress.    Transfers                         Balance                                           ADL either performed or assessed with clinical judgement   ADL Overall ADL's : Needs assistance/impaired   Eating/Feeding Details (indicate cue type and reason): Noted pt reaching more easily with RUE to gather items off bedside table.                   Lower Body Dressing Details (indicate cue type and reason): Pt and spouse shown dressing stick for duel purpose of RUE rehab as well as compensatory strategies for ease and energy conservation with LB dressing. OT demonstrated common uses.               General ADL Comments: Session focused on progressing RUE rehab in support of RT arm function for ADLs and mobility as pt with chronic atrophy to LUE from polio as child.    Extremity/Trunk Assessment Upper Extremity Assessment Upper Extremity Assessment: Right hand dominant;RUE deficits/detail;LUE deficits/detail RUE Deficits / Details: RUE edetamous with min blistering to dorsum of forearm; possible IV infiltrate per wife. Weakness since ACDF with pt  and wife reporting exacerbated weakness with admission and abd surgery. PROM WFL; AROM shoulder to ~15 degrees. 3/5 grip. pronation/supination WFL. Elbow flexion to ~110 degrees. (See Exercises) LUE Deficits / Details: impaired at baseline, length discrepancy from polio since 8 mos of age. weakness. unable to supinate. profoud weakness at shoulder. PROM not formally assessed. Noted acute dorsal hand swelling and LUE elevated on 2 pillows.   Lower Extremity Assessment Lower Extremity Assessment: Defer to PT evaluation   Cervical / Trunk Assessment Cervical / Trunk  Assessment: Neck Surgery    Vision Patient Visual Report: No change from baseline     Perception     Praxis      Cognition Arousal: Alert Behavior During Therapy: WFL for tasks assessed/performed Overall Cognitive Status: Within Functional Limits for tasks assessed                                          Exercises Other Exercises Other Exercises: Pt performed sidelying RUE ER in low position x10. Then supine, pt performed 10 reps RUE miliatary press, followed by 5 reps of straight arm raises but noted after 5th rep, pt began to compensate with elbow and upport trap and cued for rest break. Pt completed RUE horizon abd/add x 4 then required assistanec at elbow to keep UE up to continue to 10th rep.  Pt completed 10 reps of ceiling punch for for serratus ant stregnthening.  Pt performed AAROM for abduction with cues for hand position to prevent shoulder impingement. Other Exercises: Green and red T-Bands tied to pt's HOB and pt shown how to use bands for assistance with shoulder flexion to promote smooth GH gliding as well as eccentric strengthening.  Pt performed sevearl reps with both green, then doubled red for increased challenge with good progress noted. Spouse agred to take bands with them as they anticipate move to floors today.  Increased time educating pt and spouse on importance of stopping exercises if movements become compensatory and giving self a rest break to ensure optimal GH joint movement. Other Exercises: Manual therapy: Sidelying RT scapular glides and upper trap stretches.    Shoulder Instructions       General Comments      Pertinent Vitals/ Pain       Pain Assessment Pain Assessment: No/denies pain  Home Living                                          Prior Functioning/Environment              Frequency  Min 1X/week        Progress Toward Goals  OT Goals(current goals can now be found in the care plan  section)  Progress towards OT goals: Progressing toward goals  Acute Rehab OT Goals Patient Stated Goal: Work on my right arm some more OT Goal Formulation: With patient/family Time For Goal Achievement: 01/14/24 Potential to Achieve Goals: Good  Plan      Co-evaluation                 AM-PAC OT 6 Clicks Daily Activity     Outcome Measure   Help from another person eating meals?: None Help from another person taking care of personal grooming?: A Little Help from another person toileting,  which includes using toliet, bedpan, or urinal?: A Little Help from another person bathing (including washing, rinsing, drying)?: A Little Help from another person to put on and taking off regular upper body clothing?: A Little Help from another person to put on and taking off regular lower body clothing?: A Lot 6 Click Score: 18    End of Session    OT Visit Diagnosis: Unsteadiness on feet (R26.81);Muscle weakness (generalized) (M62.81)   Activity Tolerance Patient tolerated treatment well   Patient Left in bed;with family/visitor present   Nurse Communication  (Rn in room and approved OT visit)        Time: 8682-8650 OT Time Calculation (min): 32 min  Charges: OT General Charges $OT Visit: 1 Visit OT Treatments $Therapeutic Exercise: 23-37 mins  Delon, OT Acute Rehab Services Office: [phone] 01/06/2024   Delon Falter 01/06/2024, 2:16 PM

## 2024-01-06 NOTE — Assessment & Plan Note (Signed)
 Status post operative intervention as noted above Postoperative management per general surgery

## 2024-01-06 NOTE — Progress Notes (Signed)
 Physical Therapy Treatment Patient Details Name: Hunter [name] MRN: [id number] DOB: [birth year] Today's Date: 01/06/2024   History of Present Illness 78 yo male presents to therapy following hospital admission on 12/28/2022 for abdominal hernia repair. 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    PT Comments  Pt is progressing well with mobility, he tolerated increased ambulation distance of 350' with RW, HR 80s walking, no dyspnea, no loss of balance.     If plan is discharge home, recommend the following: A little help with walking and/or transfers;A little help with bathing/dressing/bathroom;Assistance with cooking/housework;Help with stairs or ramp for entrance;Assist for transportation   Can travel by private vehicle        Equipment Recommendations  None recommended by PT    Recommendations for Other Services       Precautions / Restrictions Precautions Precautions: Fall Precaution Comments: L U weakness-chronic from polio as a baby, monitor HR Restrictions Weight Bearing Restrictions Per Provider Order: No     Mobility  Bed Mobility Overal bed mobility: Needs Assistance Bed Mobility: Rolling, Sidelying to Sit Rolling: Supervision Sidelying to sit: Min assist       General bed mobility comments: min A to raise trunk    Transfers Overall transfer level: Needs assistance Equipment used: Rolling walker (2 wheels) Transfers: Sit to/from Stand Sit to Stand: Contact guard assist, From elevated surface           General transfer comment: VCs hand placement    Ambulation/Gait Ambulation/Gait assistance: Contact guard assist Gait Distance (Feet): 350 Feet Assistive device: Rolling walker (2 wheels) Gait Pattern/deviations: Step-through pattern, Trunk flexed, Decreased stride length Gait velocity: WFL     General  Gait Details: steady, no loss of balance, HR 80s walking   Stairs             Wheelchair Mobility     Tilt Bed    Modified Rankin (Stroke Patients Only)       Balance Overall balance assessment: Mild deficits observed, not formally tested                                          Cognition Arousal: Alert Behavior During Therapy: WFL for tasks assessed/performed Overall Cognitive Status: Within Functional Limits for tasks assessed                                          Exercises      General Comments        Pertinent Vitals/Pain Pain Assessment Pain Score: 0-No pain    Home Living                          Prior Function            PT Goals (current goals can now be found in the care plan section) Acute Rehab PT Goals Patient Stated Goal: get back out on the golf course PT Goal Formulation: With patient/family Time For Goal Achievement: 01/13/24 Potential to Achieve Goals: Good Progress towards PT goals: Progressing toward goals    Frequency    Min 1X/week  PT Plan      Co-evaluation              AM-PAC PT 6 Clicks Mobility   Outcome Measure  Help needed turning from your back to your side while in a flat bed without using bedrails?: A Little Help needed moving from lying on your back to sitting on the side of a flat bed without using bedrails?: A Little Help needed moving to and from a bed to a chair (including a wheelchair)?: A Little Help needed standing up from a chair using your arms (e.g., wheelchair or bedside chair)?: A Little Help needed to walk in hospital room?: None Help needed climbing 3-5 steps with a railing? : A Little 6 Click Score: 19    End of Session Equipment Utilized During Treatment: Gait belt Activity Tolerance: Patient tolerated treatment well Patient left: in chair;with call bell/phone within reach;with family/visitor present Nurse Communication:  Mobility status PT Visit Diagnosis: Other abnormalities of gait and mobility (R26.89);Difficulty in walking, not elsewhere classified (R26.2);Pain     Time: 8976-8952 PT Time Calculation (min) (ACUTE ONLY): 24 min  Charges:    $Therapeutic Activity: 8-22 mins PT General Charges $$ ACUTE PT VISIT: 1 Visit                     Hunter [name] PT 01/06/2024  Acute Rehabilitation Services  Office [phone]

## 2024-01-06 NOTE — Progress Notes (Signed)
 01/06/2024  Hunter [name] [id number] 09-05-1946  CARE TEAM: PCP: Paola Greig CROME, FNP  Outpatient Care Team: Patient Care Team: Paola Greig CROME, FNP as PCP - General (Family Medicine) Croitoru, Jerel, MD as PCP - Cardiology (Cardiology) Croitoru, Jerel, MD as Attending Physician (Cardiology) Mayo, Darice LABOR, FNP as WOC Nurse (Nurse Practitioner) Rogers Hai, MD as Medical Oncologist (Medical Oncology) Alvaro Ricardo KATHEE Raddle., MD as Consulting Physician (Urology) Colon Shove, MD as Consulting Physician (Neurosurgery) Sheldon Standing, MD as Consulting Physician (General Surgery)  Inpatient Treatment Team: Treatment Team:  Sheldon Standing, MD Lbcardiology, Rande, MD Bobbette Life, MD Sheldon Standing, MD Kenard Zachary PARAS, MD Viverito, Steva LABOR, RN Laurice Ernst, [place] Porter Olam HERO, RN Covil, Vernell LABOR, NT Hillery Nest, OT   Problem List:   Principal Problem:   Incarcerated incisional hernia Active Problems:   Paroxysmal atrial fibrillation (HCC)   Hyperlipidemia   GOUT   MONONEURITIS OF UNSPECIFIED SITE   Essential hypertension   GERD   CAD s/p CABG   S/P CABG x 4, 12/12/12, LIMA-LAD;VG-OM1,OM2;VG-PD   NSVT (nonsustained ventricular tachycardia), 9 beats 12/23   Port-A-Cath in place   Parastomal hernia of ileal conduit   History of poliomyelitis   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   CKD stage 3b, GFR 30-44 ml/min (HCC)   Acute renal failure superimposed on stage 3b chronic kidney disease (HCC)   Normocytic anemia   Protein-calorie malnutrition, severe (HCC)   Ileus (HCC)   Acute metabolic encephalopathy   Small bowel obstruction (HCC)   12/29/2023  POST-OPERATIVE DIAGNOSIS:   Ventral incisional abdominal wall hernias 13x5cm region - incarcerated Parastomal ileal conduit hernia - incarcerated    PROCEDURE:   ROBOTIC TRANSVERSUS ABDOMINIS RELEASE (TAR) COMPONENT SEPARATION - UNILATERAL ROBOTIC REPAIR OF ABDOMINAL HERNIA WITH MESH   ROBOTIC PARASTOMAL HERNIA REPAIR OF ILEAL CONDUIT WITH MESH (SUGARBAKER) ROBOTIC LYSIS OD ADHESIONS COLON REPAIR TAP BLOCK - BILATERAL   SURGEON:  Standing KYM Sheldon, MD  OR FINDINGS: Parastomal hernia around ileal conduit incarcerated with omentum.  5 x 4 cm region.  Primarily closed.   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  13 x 5 cm region.  Right sided component separation TAR release done to allow largest 8x5cm defect to be primarily closed   Type of ventral wall repair:  Laparoscopic underlay repair .with Primary repair of largest hernia Placement of mesh: Retrorectus / Preperitoneal underlay repair Name of mesh: Bard Ventralight dual sided (polypropylene / Seprafilm) Size of mesh: 33x27cm Orientation:  Mostly vertical with slightly oblique to right lower quadrant Mesh overlap:  5-7cm   Assessment [facility] Stay = 8 days) 8 Days Post-Op    A-fib controlled better with amiodarone .  Now in sinus.  Ileus resolved   Plan:  Tolerate dysphagia 3 diet and hungry.  Will advance diet.  Some increased phlegm most likely related to irritation of lines and tubes and NG tube.  Will give a few scheduled doses of Magic mouthwash and follow-up.  Keep on the dry side.  Hold off on TPN for now  Switch to oral anticoagulation since he is tolerating p.o.  I wrote for Eliquis .  Can stop Lovenox  after that.  Defer to medicine/cardiology if they wish to adjust.  Still on IV amiodarone .  This first time he is back in sinus.  He is already on oral metoprolol .  Defer to them on switching over to oral medications.  Stable to transfer to telemetry from my standpoint see if medicine  and cardiology agree.    AKI resolved.  Cr peak of 2.1 coming down to baseline range of CKD.  He is nonoliguric and that seems to be stabilizing.  Keeping on the dry side for now.  Try to avoid dehydration with PRN (as needed) backups.  Follow electrolytes.  Keep potassium above 4 and  magnesium  above 2.    No evidence of peritonitis and I doubt any active infection at this time.  No soreness.  Able to do good strong cough without any complaints.    Much more alert and joking back to his baseline.  No delirium or sundowning.  Low-dose as needed lorazepam  for night just in case.  History of polio with chronic left upper extremity weakness.  Right upper extremity weakness postop solved.  Same with edema.  Have therapies to be involved and follow.  He gives me no other signs of neurological decline so I do not think he is having a stroke or other issue.  Follow closely.  Should improve.  History of gout.  Allopurinol  restarted 1/9   History of allergies.  Continue Flonase   History of hypertension.  Hypotension with his atrial fibrillation so therefore no aggressive treatment.    VTE prophylaxis- SCDs. Okay to do heparin  drip as needed given atrial fibrillation.  -mobilize as tolerated to help recovery   -Disposition:  Disposition: TBD T    I reviewed nursing notes, last 24 h vitals and pain scores, last 48 h intake and output, last 24 h labs and trends, and last 24 h imaging results.  I have reviewed this patient's available data, including medical history, events of note, test results, etc as part of my evaluation.   A significant portion of that time was spent in counseling. Care during the described time interval was provided by me.  This care required moderate level of medical decision making.  01/06/2024    Subjective: (Chief complaint)  Patient started on amiodarone .  Has converted is in sinus rhythm with better rate control.  Also on oral metoprolol .  Some mild bruising on subcu Lovenox .  Tolerated full liquid diet and wants solid food.  Coughing up a little bit of phlegm but no pain with coughing and no worsening discharge.  On room air.  Wife in room.  Nursing just outside room.  Objective:  Vital signs:  Vitals:   01/06/24 0305 01/06/24  0400 01/06/24 0500 01/06/24 0600  BP: (!) 110/44 (!) 109/55 129/63 (!) 108/52  Pulse: 73 69 72 70  Resp: 19 16 18 17   Temp:      TempSrc:      SpO2: 96% 95% 97% 95%  Weight:      Height:        Last BM Date : 01/06/24  Intake/Output   Yesterday:  01/09 0701 - 01/10 0700 In: 1073.6 [P.O.:480; I.V.:593.6] Out: 1952 [Urine:1950; Stool:2] This shift:  No intake/output data recorded.  Bowel function:  Flatus: YES  BM:  YES  Drain: none   Physical Exam:  General: Pt awake/alert in no acute distress.  Alert and chatty and joking.   Eyes: PERRL, normal EOM.  Sclera clear.  No icterus Neuro: CN II-XII intact w/o focal sensory/motor deficits.  Chronic left upper extremity weakness this is baseline.  Right upper extremity back to only mildly decreased function/active range of motion.  No problem with passive motion though.  Good handgrip. Lymph: No head/neck/groin lymphadenopathy Psych:  No delerium/psychosis/paranoia.  Oriented x 4 HENT: Normocephalic, Mucus membranes moist.  No thrush Neck: Supple, No tracheal deviation.  No obvious thyromegaly Chest: No pain to chest wall compression.  Good respiratory excursion.  No audible wheezing CV:  Pulses intact.  Regular rhythm.  No major extremity edema MS: Normal AROM mjr joints.  No obvious deformity  Abdomen: Soft.  Nondistended.  Incisions clean dry and intact.  No cellulitis.  Nontender.  He gave a good good strong cough without any guarding or discomfort.  No peritonitis.  Minimal ecchymosis on mons pubis.    Right lower quadrant ileal conduit urostomy pink with clear light yellow urine in bag  Ext:   No deformity.  Edema resolved.  No cyanosis Skin: No petechiae / purpurea.  No major sores.  Warm and dry    Results:   Cultures: Recent Results (from the past 720 hours)  MRSA Next Gen by PCR, Nasal     Status: None   Collection Time: 01/03/24 11:39 AM   Specimen: Nasal Mucosa; Nasal Swab  Result Value Ref Range Status    MRSA by PCR Next Gen NOT DETECTED NOT DETECTED Final    Comment: (NOTE) The GeneXpert MRSA Assay (FDA approved for NASAL specimens only), is one component of a comprehensive MRSA colonization surveillance program. It is not intended to diagnose MRSA infection nor to guide or monitor treatment for MRSA infections. Test performance is not FDA approved in patients less than 25 years old. Performed at [facility], [address] [place], [place] [address]     Labs: Results for orders placed or performed during the hospital encounter of 12/29/23 (from the past 48 hours)  Heparin  level (unfractionated)     Status: Abnormal   Collection Time: 01/04/24  4:18 PM  Result Value Ref Range   Heparin  Unfractionated >1.10 (H) 0.30 - 0.70 IU/mL    Comment: (NOTE) The clinical reportable range upper limit is being lowered to >1.10 to align with the FDA approved guidance for the current laboratory assay.  If heparin  results are below expected values, and patient dosage has  been confirmed, suggest follow up testing of antithrombin III levels. Performed at [facility], [address] [place], [place] [address]   Heparin  level (unfractionated)     Status: Abnormal   Collection Time: 01/04/24  8:31 PM  Result Value Ref Range   Heparin  Unfractionated >1.10 (H) 0.30 - 0.70 IU/mL    Comment: (NOTE) The clinical reportable range upper limit is being lowered to >1.10 to align with the FDA approved guidance for the current laboratory assay.  If heparin  results are below expected values, and patient dosage has  been confirmed, suggest follow up testing of antithrombin III levels. Performed at [facility] [place], [address], [place], [place] [address]   Glucose, capillary     Status: Abnormal   Collection Time: 01/05/24 12:06 AM  Result Value Ref Range   Glucose-Capillary 128 (H) 70 - 99 mg/dL    Comment: Glucose reference range applies only  to samples taken after fasting for at least 8 hours.   Comment 1 Notify RN    Comment 2 Document in Chart   Glucose, capillary     Status: Abnormal   Collection Time: 01/05/24  5:09 AM  Result Value Ref Range   Glucose-Capillary 109 (H) 70 - 99 mg/dL    Comment: Glucose reference range applies only to samples taken after fasting for at least 8 hours.   Comment 1 Notify RN    Comment 2 Document  in Chart   Comprehensive metabolic panel     Status: Abnormal   Collection Time: 01/05/24  8:18 AM  Result Value Ref Range   Sodium 142 135 - 145 mmol/L   Potassium 4.3 3.5 - 5.1 mmol/L   Chloride 112 (H) 98 - 111 mmol/L   CO2 22 22 - 32 mmol/L   Glucose, Bld 127 (H) 70 - 99 mg/dL    Comment: Glucose reference range applies only to samples taken after fasting for at least 8 hours.   BUN 50 (H) 8 - 23 mg/dL   Creatinine, Ser 8.39 (H) 0.61 - 1.24 mg/dL   Calcium  8.1 (L) 8.9 - 10.3 mg/dL   Total Protein 5.9 (L) 6.5 - 8.1 g/dL   Albumin  2.9 (L) 3.5 - 5.0 g/dL   AST 17 15 - 41 U/L   ALT 12 0 - 44 U/L   Alkaline Phosphatase 50 38 - 126 U/L   Total Bilirubin 0.7 0.0 - 1.2 mg/dL   GFR, Estimated 44 (L) >60 mL/min    Comment: (NOTE) Calculated using the CKD-EPI Creatinine Equation (2021)    Anion gap 8 5 - 15    Comment: Performed at [facility], [address] [place], [place] [address]  Magnesium      Status: None   Collection Time: 01/05/24  8:18 AM  Result Value Ref Range   Magnesium  2.2 1.7 - 2.4 mg/dL    Comment: Performed at [facility], [address] [place], [place] [address]  Phosphorus     Status: None   Collection Time: 01/05/24  8:18 AM  Result Value Ref Range   Phosphorus 2.7 2.5 - 4.6 mg/dL    Comment: Performed at [facility], [address], [place], [place] [address]  CBC     Status: Abnormal   Collection Time: 01/05/24  8:18 AM  Result Value Ref Range   WBC 7.5 4.0 - 10.5 K/uL   RBC 3.45 (L) 4.22 - 5.81  MIL/uL   Hemoglobin 10.9 (L) 13.0 - 17.0 g/dL   HCT 65.2 (L) 60.9 - 47.9 %   MCV 100.6 (H) 80.0 - 100.0 fL   MCH 31.6 26.0 - 34.0 pg   MCHC 31.4 30.0 - 36.0 g/dL   RDW 85.9 88.4 - 84.4 %   Platelets 255 150 - 400 K/uL   nRBC 0.0 0.0 - 0.2 %    Comment: Performed at [facility], [address] [place], [place] [address]  Heparin  level (unfractionated)     Status: Abnormal   Collection Time: 01/05/24  8:18 AM  Result Value Ref Range   Heparin  Unfractionated 0.19 (L) 0.30 - 0.70 IU/mL    Comment: (NOTE) The clinical reportable range upper limit is being lowered to >1.10 to align with the FDA approved guidance for the current laboratory assay.  If heparin  results are below expected values, and patient dosage has  been confirmed, suggest follow up testing of antithrombin III levels. Performed at [facility], [address] [place], [place] [address]   Ferritin     Status: None   Collection Time: 01/05/24  8:18 AM  Result Value Ref Range   Ferritin 191 24 - 336 ng/mL    Comment: Performed at [facility], [address], [place], [place] [address]  Folate     Status: None   Collection Time: 01/05/24  8:18 AM  Result Value Ref Range   Folate 8.1 >5.9  ng/mL    Comment: Performed at [facility], [address], [place], [place] [address]  Iron and TIBC     Status: Abnormal   Collection Time: 01/05/24  8:18 AM  Result Value Ref Range   Iron 42 (L) 45 - 182 ug/dL   TIBC 772 (L) 749 - 549 ug/dL   Saturation Ratios 19 17.9 - 39.5 %   UIBC 185 ug/dL    Comment: Performed at [facility], [address], [place], [place] [address]  Vitamin B12     Status: None   Collection Time: 01/05/24  8:18 AM  Result Value Ref Range   Vitamin B-12 390 180 - 914 pg/mL    Comment: (NOTE) This assay is not validated for testing neonatal or myeloproliferative syndrome specimens for Vitamin B12  levels. Performed at [facility], [address], [place], [place] [address]   Troponin I (High Sensitivity)     Status: None   Collection Time: 01/05/24  8:18 AM  Result Value Ref Range   Troponin I (High Sensitivity) 10 <18 ng/L    Comment: Performed at [facility], [address] [place], [place] [address]  Glucose, capillary     Status: Abnormal   Collection Time: 01/05/24 11:42 AM  Result Value Ref Range   Glucose-Capillary 112 (H) 70 - 99 mg/dL    Comment: Glucose reference range applies only to samples taken after fasting for at least 8 hours.  Glucose, capillary     Status: Abnormal   Collection Time: 01/05/24  5:48 PM  Result Value Ref Range   Glucose-Capillary 115 (H) 70 - 99 mg/dL    Comment: Glucose reference range applies only to samples taken after fasting for at least 8 hours.  Glucose, capillary     Status: Abnormal   Collection Time: 01/06/24 12:34 AM  Result Value Ref Range   Glucose-Capillary 102 (H) 70 - 99 mg/dL    Comment: Glucose reference range applies only to samples taken after fasting for at least 8 hours.  CBC     Status: Abnormal   Collection Time: 01/06/24  5:36 AM  Result Value Ref Range   WBC 10.3 4.0 - 10.5 K/uL   RBC 3.51 (L) 4.22 - 5.81 MIL/uL   Hemoglobin 10.9 (L) 13.0 - 17.0 g/dL   HCT 65.1 (L) 60.9 - 47.9 %   MCV 99.1 80.0 - 100.0 fL   MCH 31.1 26.0 - 34.0 pg   MCHC 31.3 30.0 - 36.0 g/dL   RDW 85.5 88.4 - 84.4 %   Platelets 305 150 - 400 K/uL   nRBC 0.0 0.0 - 0.2 %    Comment: Performed at [facility], [address], [place], [place] [address]  Glucose, capillary     Status: None   Collection Time: 01/06/24  5:43 AM  Result Value Ref Range   Glucose-Capillary 96 70 - 99 mg/dL    Comment: Glucose reference range applies only to samples taken after fasting for at least 8 hours.    Imaging / Studies: DG Abd Portable 2V Result Date: 01/05/2024 CLINICAL DATA:  Ileus EXAM:  PORTABLE ABDOMEN - 2 VIEW COMPARISON:  01/04/2024 FINDINGS: No significant change in abdominal radiographs. Similar gas distended loops of small bowel in the left hemiabdomen measuring up to 4.5 cm in caliber. Enteric contrast remains throughout the colon to the rectum. Esophagogastric tube with tip and side port below the diaphragm. No free air. No  acute osseous findings. IMPRESSION: No significant change in abdominal radiographs. Similar gas distended loops of small bowel in the left hemiabdomen measuring up to 4.5 cm in caliber. Enteric contrast remains throughout the colon to the rectum. Electronically Signed   By: Marolyn JONETTA Jaksch M.D.   On: 01/05/2024 09:22   DG Abd Portable 1V-Small Bowel Obstruction Protocol-24 hr delay Result Date: 01/04/2024 CLINICAL DATA:  NG tube placement, 24 hour contrast delay EXAM: PORTABLE ABDOMEN - 1 VIEW COMPARISON:  01/03/2024 4:42 p.m. FINDINGS: Enteric contrast is present throughout the colon to the rectum. Mildly gas distended loops of small bowel present in the left hemiabdomen, measuring up to 4.8 cm in caliber. No obvious free air. No enteric tube identified on this examination, which excludes the upper abdomen and hemidiaphragms. No acute osseous findings. IMPRESSION: 1. Enteric contrast is present throughout the colon to the rectum. 2. Mildly gas distended loops of small bowel present in the left hemiabdomen, measuring up to 4.8 cm in caliber. 3. No enteric tube identified on this examination, which excludes the upper abdomen and hemidiaphragms. Electronically Signed   By: Marolyn JONETTA Jaksch M.D.   On: 01/04/2024 12:47   US  EKG SITE RITE Result Date: 01/04/2024 If Site Rite image not attached, placement could not be confirmed due to current cardiac rhythm.   Medications / Allergies: per chart  Antibiotics: Anti-infectives (From admission, onward)    Start     Dose/Rate Route Frequency Ordered Stop   12/29/23 1600  ceFAZolin  (ANCEF ) IVPB 2g/100 mL premix        2  g 200 mL/hr over 30 Minutes Intravenous Every 8 hours 12/29/23 1401 12/30/23 0128   12/29/23 1600  metroNIDAZOLE  (FLAGYL ) IVPB 500 mg        500 mg 100 mL/hr over 60 Minutes Intravenous Every 8 hours 12/29/23 1401 12/30/23 0844   12/29/23 0600  ceFAZolin  (ANCEF ) IVPB 2g/100 mL premix       Placed in And Linked Group   2 g 200 mL/hr over 30 Minutes Intravenous On call to O.R. 12/29/23 9470 12/29/23 0734   12/29/23 0600  metroNIDAZOLE  (FLAGYL ) IVPB 500 mg       Placed in And Linked Group   500 mg 100 mL/hr over 60 Minutes Intravenous On call to O.R. 12/29/23 0529 12/29/23 0746         Note: Portions of this report may have been transcribed using voice recognition software. Every effort was made to ensure accuracy; however, inadvertent computerized transcription errors may be present.   Any transcriptional errors that result from this process are unintentional.    Elspeth KYM Schultze, MD, FACS, MASCRS Esophageal, Gastrointestinal & Colorectal Surgery Robotic and Minimally Invasive Surgery  [facility] [address] [place], [place] [address] [phone] Fax [phone] Main  CONTACT INFORMATION: Weekday (9AM-5PM): Call [facility] main office at [phone] Weeknight (5PM-9AM) or Weekend/Holiday: Check EPIC Web Links tab & use AMION (password  [email or web address]) for General Surgery [facility] coverage  Please, DO NOT use SecureChat  (it is not reliable communication to reach operating surgeons & will lead to a delay in care).   Epic staff messaging available for outptient concerns needing 1-2 business day response.      01/06/2024  7:01 AM

## 2024-01-06 NOTE — Progress Notes (Signed)
 Pharmacy: Drug Interaction  Pt on simvastatin  40 mg po daily New start amiodarone  200 mg po bid. Risk of rhabdo with this combination  Plan: change simvastatin  40 mg daily to atorvastatin  20 mg daily to avoid drug interaction with amio.  Rosaline IVAR Edison, Pharm.D Use secure chat for questions 01/06/2024 2:25 PM

## 2024-01-06 NOTE — Progress Notes (Signed)
 PROGRESS NOTE   Hunter [name]  FMW:[id number] DOB: 15-Jul-1946 DOA: 12/29/2023 PCP: Paola Greig CROME, FNP   Date of Service: the patient was seen and examined on 01/06/2024  Brief Narrative:  78 year old man PMH CAD (s/p CABG in 2013 with LIMA-LAD, SVG-OM1-OM2 and SVG-PDA), paroxysmal atrial fibrillation, NSVT, chronic kidney disease stage IIIb, hypertension, hyperlipidemia, gastroesophageal reflux disease, gout who presented to [facility] on 1/2 for robotic assisted ventral incisional abdominal wall hernia and parastomal hernia repair admitted postoperatively to the general surgery service with Dr. Sheldon.    In the days following the surgery the patient began to develop multiple complications including acute kidney injury superimposed upon his chronic kidney disease stage IIIb for which the hospitalist group was consulted as well as developing rapid atrial fibrillation for which cardiology was consulted.   Atrial fibrillation had proven to be difficult to manage, initially not responding well to diltiazem  or metoprolol .  Attempts were then made to load patient with digoxin  on 1/8 and after that was unsuccessful patient and patient began to convert into rapid atrial flutter they were placed on an amiodarone  infusion morning of 1/9 which proved to be effective.  Patient was initially anticoagulated with heparin  and later transitioned to Lovenox  due to elevated CHA2DS2-VASc score.  Patient's acute kidney injury gradually did improve with improving clinical condition and intravenous hydration.  Due to prolonged postoperative ileus patient was initiated on TPN on 1/8 for a brief period of time but with improving bowel function diet was advanced on 1/9 and TPN was discontinued.    Assessment & Plan Ileus (HCC) Management per general surgery TPN was initiated on 1/8 but was later discontinued on 1/9 with improving ileus and tolerance of food intake.    Acute renal failure superimposed on  stage 3b chronic kidney disease (HCC) Baseline creatinine 1.5-1.7 Creatinine now 1.44, down from 2.14 during this hospitalization Continuing to monitor renal function with serial chemistries Paroxysmal atrial fibrillation (HCC) Patient has cardioverted overnight since the initiation of amiodarone  1/9.  Theology now recommending transitioning patient from intravenous amiodarone  infusion to oral load.  The anticipate patient will be on amiodarone  for 1 month with eventual discontinuation as an outpatient. Patient is being transitioned to Eliquis  for likely lifelong anticoagulation for CHA2DS2-VASc of 4. Digoxin  discontinued Metoprolol  50 mg twice daily Cardiology following TSH unremarkable.  Echocardiogram reveals preserved ejection fraction with no significant valvular abnormality  Acute metabolic encephalopathy Resolved Normocytic anemia No current clinical evidence of bleeding Hemoglobin now stable Work up revealing mild iron deficiency, will initiate low-dose daily iron therapy. CAD s/p CABG Back on statin therapy now the patient is tolerating oral intake  We will inquire with cardiology about resumption of aspirin  Chest pain-free Monitoring patient on telemetry NSVT (nonsustained ventricular tachycardia), 9 beats 12/23 No recent episodes Hyperlipidemia Resumed statin therapy with atorvastatin  20 mg daily. Incarcerated incisional hernia Status post operative intervention as noted above Postoperative management per general surgery Parastomal hernia of ileal conduit Status post operative intervention as noted above Postoperative management per general surgery Protein-calorie malnutrition, severe (HCC) Advancing diet, encouraging oral intake. TPN discontinued. GOUT Allopurinol  resumed     Subjective:  Patient currently denying any abdominal pain or chest pain.  Patient denies shortness of breath.    Physical Exam:  Vitals:   01/06/24 0500 01/06/24 0600 01/06/24 0700  01/06/24 0800  BP: 129/63 (!) 108/52 (!) 120/57 108/62  Pulse: 72 70 69 68  Resp: 18 17 13 18   Temp:  TempSrc:      SpO2: 97% 95% 98% 99%  Weight:      Height:        Constitutional: Awake alert and oriented x3, no associated distress.   Skin: no rashes, no lesions, poor skin turgor noted. Eyes: Pupils are equally reactive to light.  No evidence of scleral icterus or conjunctival pallor.  ENMT: Moist mucous membranes noted.  Posterior pharynx clear of any exudate or lesions.   Respiratory: clear to auscultation bilaterally, no wheezing, no crackles. Normal respiratory effort. No accessory muscle use.  Cardiovascular: Regular rate and rhythm, no murmurs / rubs / gallops. No extremity edema. 2+ pedal pulses. No carotid bruits.  Abdomen: Soft abdomen.  Normal active bowel sounds.  Ostomy revealing pink mucosa with no evidence of blood. Musculoskeletal: No joint deformity upper and lower extremities. Good ROM, no contractures. Normal muscle tone.    Data Reviewed:  I have personally reviewed and interpreted labs, imaging.  Significant findings are   CBC: Recent Labs  Lab 01/01/24 0402 01/02/24 0408 01/03/24 0309 01/05/24 0818 01/06/24 0536  WBC 10.9* 8.3 8.4 7.5 10.3  HGB 11.5* 9.4* 10.0* 10.9* 10.9*  HCT 35.7* 28.5* 31.6* 34.7* 34.8*  MCV 97.5 96.9 98.1 100.6* 99.1  PLT 177 169 207 255 305   Basic Metabolic Panel: Recent Labs  Lab 01/01/24 0402 01/01/24 0903 01/02/24 0408 01/03/24 0309 01/04/24 0428 01/04/24 0429 01/05/24 0818 01/06/24 0536  NA  --    < > 136 136  --  140 142 138  K 5.0   < > 4.1 3.7  --  3.6 4.3 4.0  CL  --    < > 107 107  --  108 112* 111  CO2  --    < > 20* 21*  --  21* 22 20*  GLUCOSE  --    < > 134* 120*  --  141* 127* 111*  BUN  --    < > 40* 41*  --  53* 50* 43*  CREATININE 2.10*   < > 1.94* 1.68*  --  1.69* 1.60* 1.44*  CALCIUM   --    < > 8.3* 8.2*  --  8.1* 8.1* 8.3*  MG 2.1  --  2.2  --  2.5*  --  2.2  --   PHOS  --   --   --    --  3.1  --  2.7  --    < > = values in this interval not displayed.   GFR: Estimated Creatinine Clearance: 46.7 mL/min (A) (by C-G formula based on SCr of 1.44 mg/dL (H)). Liver Function Tests: Recent Labs  Lab 01/01/24 0903 01/02/24 0408 01/05/24 0818 01/06/24 0536  AST 21 19 17  37  ALT 7 8 12 22   ALKPHOS 58 47 50 57  BILITOT 0.6 0.8 0.7 0.9  PROT 5.5* 5.8* 5.9* 6.3*  ALBUMIN  2.4* 3.2* 2.9* 3.0*    Coagulation Profile: Recent Labs  Lab 01/01/24 1302  INR 1.2     Telemetry: Personally reviewed.  Rhythm is normal sinus rhythm heart rate of 80 bpm.  Code Status:  Full code.  Code status decision has been confirmed with: patient Family Communication: Plan of care discussed with wife at the bedside   Severity of Illness:  The appropriate patient status for this patient is INPATIENT. Inpatient status is judged to be reasonable and necessary in order to provide the required intensity of service to ensure the patient's safety. The patient's presenting symptoms, physical  exam findings, and initial radiographic and laboratory data in the context of their chronic comorbidities is felt to place them at high risk for further clinical deterioration. Furthermore, it is not anticipated that the patient will be medically stable for discharge from the hospital within 2 midnights of admission.   * I certify that at the point of admission it is my clinical judgment that the patient will require inpatient hospital care spanning beyond 2 midnights from the point of admission due to high intensity of service, high risk for further deterioration and high frequency of surveillance required.*  Time spent:  39 minutes  Author:  Zachary JINNY Ba MD  01/06/2024 8:50 AM

## 2024-01-06 NOTE — Progress Notes (Signed)
 Patient Name: Hunter [name] Date of Encounter: 01/06/2024 [facility] Cardiologist: Jerel Balding, MD   Interval Summary  .    Feeling well.  Ambulated without difficulty.  Converted to sinus rhythm.   Vital Signs .    Vitals:   01/06/24 1000 01/06/24 1100 01/06/24 1129 01/06/24 1200  BP: (!) 118/56 102/62  97/85  Pulse: 66 67 68 66  Resp: 16 18 19 14   Temp:      TempSrc:      SpO2: 98% 99% 98% 100%  Weight:      Height:        Intake/Output Summary (Last 24 hours) at 01/06/2024 1258 Last data filed at 01/06/2024 1254 Gross per 24 hour  Intake 916.62 ml  Output 1602 ml  Net -685.38 ml      01/06/2024    1:05 AM 01/06/2024   12:42 AM 01/05/2024    3:41 AM  Last 3 Weights  Weight (lbs) 190 lb 0.6 oz 190 lb 0.6 oz 188 lb 7.9 oz  Weight (kg) 86.2 kg 86.2 kg 85.5 kg      Telemetry/ECG    Atrial fibrillation. Rate 90s-110s.  - Personally Reviewed  Physical Exam .    VS:  BP 97/85   Pulse 66   Temp 97.9 F (36.6 C) (Oral)   Resp 14   Ht 5' 9 (1.753 m)   Wt 86.2 kg   SpO2 100%   BMI 28.06 kg/m  , BMI Body mass index is 28.06 kg/m. GENERAL: Ill-appearing HEENT: Pupils equal round and reactive, fundi not visualized, oral mucosa unremarkable.   NECK:  No jugular venous distention, waveform within normal limits, carotid upstroke brisk and symmetric, no bruits, no thyromegaly LUNGS:  Clear to auscultation bilaterally HEART:  RRR.  PMI not displaced or sustained,S1 and S2 within normal limits, no S3, no S4, no clicks, no rubs, no murmurs ABD:  Soft, NT, ND.  +BS EXT:  2 plus pulses throughout, trace LE edema to the ankles, no cyanosis no clubbing SKIN:  No rashes no nodules NEURO:  Cranial nerves II through XII grossly intact, motor grossly intact throughout PSYCH:  Cognitively intact, oriented to person place and time  Assessment & Plan .     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  Postoperative ileus requiring NG tube placement.  Cardiology consulted for atrial fibrillation with RVR.  # Atrial fibrillation with RVR:  Post-operative atrial fibrillation.  Now converted to sinus rhythm on IV amiodarone .  He has transition from IV heparin  to Eliquis .  This is a second episode of atrial fibrillation postoperatively.  Recommend continuing Eliquis  indefinitely.  We will treat with amiodarone  for 1 month and then discontinue as an outpatient.  Continue loading with 200 mg twice daily for 10 days then 200 mg daily.  Continue oral metoprolol .  This patients CHA2DS2-VASc Score and unadjusted Ischemic Stroke Rate (% per year) is equal to 4.8 % stroke rate/year from a score of 4  Above score calculated as 1 point each if present [CHF, HTN, DM, Vascular=MI/PAD/Aortic Plaque, Age if 65-74, or Male] Above score calculated as 2 points each if present [Age > 75, or Stroke/TIA/TE]  # CAD s/p CABG:  # Hyperlipidemia:  Not an active issue.  Continue simvastatin .  No longer on aspirin  as he is now on Eliquis .  For questions or updates, please contact [facility] Please consult [email or web address] for contact info under        Signed, Annabella Scarce, MD

## 2024-01-06 NOTE — Assessment & Plan Note (Addendum)
 No current clinical evidence of bleeding Hemoglobin now stable Work up revealing mild iron deficiency, will initiate low-dose daily iron therapy.

## 2024-01-06 NOTE — Assessment & Plan Note (Addendum)
 Resolved

## 2024-01-06 NOTE — Assessment & Plan Note (Signed)
 Allopurinol resumed

## 2024-01-06 NOTE — Assessment & Plan Note (Addendum)
 Management per general surgery TPN was initiated on 1/8 but was later discontinued on 1/9 with improving ileus and tolerance of food intake.

## 2024-01-07 DIAGNOSIS — I4729 Other ventricular tachycardia: Secondary | ICD-10-CM | POA: Diagnosis not present

## 2024-01-07 DIAGNOSIS — K430 Incisional hernia with obstruction, without gangrene: Secondary | ICD-10-CM | POA: Diagnosis not present

## 2024-01-07 DIAGNOSIS — I480 Paroxysmal atrial fibrillation: Secondary | ICD-10-CM | POA: Diagnosis not present

## 2024-01-07 DIAGNOSIS — G9341 Metabolic encephalopathy: Secondary | ICD-10-CM | POA: Diagnosis not present

## 2024-01-07 LAB — COMPREHENSIVE METABOLIC PANEL
ALT: 20 U/L (ref 0–44)
AST: 25 U/L (ref 15–41)
Albumin: 2.6 g/dL — ABNORMAL LOW (ref 3.5–5.0)
Alkaline Phosphatase: 52 U/L (ref 38–126)
Anion gap: 7 (ref 5–15)
BUN: 42 mg/dL — ABNORMAL HIGH (ref 8–23)
CO2: 20 mmol/L — ABNORMAL LOW (ref 22–32)
Calcium: 7.9 mg/dL — ABNORMAL LOW (ref 8.9–10.3)
Chloride: 110 mmol/L (ref 98–111)
Creatinine, Ser: 1.39 mg/dL — ABNORMAL HIGH (ref 0.61–1.24)
GFR, Estimated: 52 mL/min — ABNORMAL LOW (ref >60)
Glucose, Bld: 103 mg/dL — ABNORMAL HIGH (ref 70–99)
Potassium: 3.8 mmol/L (ref 3.5–5.1)
Sodium: 137 mmol/L (ref 135–145)
Total Bilirubin: 0.8 mg/dL (ref 0.0–1.2)
Total Protein: 5.3 g/dL — ABNORMAL LOW (ref 6.5–8.1)

## 2024-01-07 LAB — CBC WITH DIFFERENTIAL/PLATELET
Abs Immature Granulocytes: 0.16 10*3/uL — ABNORMAL HIGH (ref 0.00–0.07)
Basophils Absolute: 0 10*3/uL (ref 0.0–0.1)
Basophils Relative: 0 %
Eosinophils Absolute: 0.3 10*3/uL (ref 0.0–0.5)
Eosinophils Relative: 3 %
HCT: 28.2 % — ABNORMAL LOW (ref 39.0–52.0)
Hemoglobin: 9.2 g/dL — ABNORMAL LOW (ref 13.0–17.0)
Immature Granulocytes: 2 %
Lymphocytes Relative: 14 %
Lymphs Abs: 1.3 10*3/uL (ref 0.7–4.0)
MCH: 32.1 pg (ref 26.0–34.0)
MCHC: 32.6 g/dL (ref 30.0–36.0)
MCV: 98.3 fL (ref 80.0–100.0)
Monocytes Absolute: 0.6 10*3/uL (ref 0.1–1.0)
Monocytes Relative: 6 %
Neutro Abs: 7.1 10*3/uL (ref 1.7–7.7)
Neutrophils Relative %: 75 %
Platelets: 250 10*3/uL (ref 150–400)
RBC: 2.87 MIL/uL — ABNORMAL LOW (ref 4.22–5.81)
RDW: 14.6 % (ref 11.5–15.5)
WBC: 9.6 10*3/uL (ref 4.0–10.5)
nRBC: 0 % (ref 0.0–0.2)

## 2024-01-07 LAB — MAGNESIUM: Magnesium: 2 mg/dL (ref 1.7–2.4)

## 2024-01-07 MED ORDER — POTASSIUM CHLORIDE CRYS ER 20 MEQ PO TBCR
20.0000 meq | EXTENDED_RELEASE_TABLET | Freq: Once | ORAL | Status: AC
  Administered 2024-01-07: 20 meq via ORAL
  Filled 2024-01-07: qty 1

## 2024-01-07 NOTE — Assessment & Plan Note (Signed)
 Allopurinol resumed

## 2024-01-07 NOTE — Progress Notes (Signed)
 Physical Therapy Treatment Patient Details Name: Hunter [name] MRN: [id number] DOB: [birth year] Today's Date: 01/07/2024   History of Present Illness 78 yo male presents to therapy following hospital admission on 12/28/2022 for abdominal hernia repair. 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    PT Comments  Pt doing well with RW with gait on unit.  HR in 80s-lower 90s with gait.  Education provided on safety at home.  They live in tri-level home, but he declined stair training, but demonstrated enough hip flexion with march in place and educated on how to use the rails with sideways stepping( due to weakness in L UE). Pt and wife appreciative of suggestions.     If plan is discharge home, recommend the following: A little help with walking and/or transfers;A little help with bathing/dressing/bathroom;Assistance with cooking/housework;Help with stairs or ramp for entrance;Assist for transportation   Can travel by private vehicle        Equipment Recommendations  None recommended by PT    Recommendations for Other Services       Precautions / Restrictions Precautions Precautions: Fall Precaution Comments: L U weakness-chronic from polio as a baby, monitor HR Restrictions Weight Bearing Restrictions Per Provider Order: No     Mobility  Bed Mobility Overal bed mobility: Needs Assistance Bed Mobility: Supine to Sit, Sit to Supine     Supine to sit: Contact guard, HOB elevated, Used rails Sit to supine: Min assist   General bed mobility comments: Pt able to use rails with HOB elevated to get to EOB.  Wife assisted with legs to return supine, but feel he probably could have done it himself.    Transfers Overall transfer level: Needs assistance Equipment used: Rolling walker (2 wheels) Transfers: Sit to/from Stand Sit to Stand:  Contact guard assist Stand pivot transfers: Contact guard assist         General transfer comment: wants to pull on RW instead of pushing up.  Sat in recliner after gait and then transferred back to bed after discussion regarding d/c planning.    Ambulation/Gait Ambulation/Gait assistance: Contact guard assist Gait Distance (Feet): 500 Feet Assistive device: Rolling walker (2 wheels) Gait Pattern/deviations: Step-through pattern, Decreased stride length Gait velocity: WFL     General Gait Details: HR 80s- lower 90s with gait.  Cadence steady thorughout.   Stairs             Wheelchair Mobility     Tilt Bed    Modified Rankin (Stroke Patients Only)       Balance                                            Cognition Arousal: Alert Behavior During Therapy: WFL for tasks assessed/performed Overall Cognitive Status: Within Functional Limits for tasks assessed                                          Exercises      General Comments General comments (skin integrity, edema, etc.): Pt has 3 levels in his split level home, so spent time with pt and wife coming up with  strategies for d/c home and discussed if he needed any DME.      Pertinent Vitals/Pain Pain Assessment Pain Assessment: No/denies pain    Home Living                          Prior Function            PT Goals (current goals can now be found in the care plan section) Acute Rehab PT Goals PT Goal Formulation: With patient/family Time For Goal Achievement: 01/13/24 Potential to Achieve Goals: Good Progress towards PT goals: Progressing toward goals    Frequency    Min 1X/week      PT Plan      Co-evaluation              AM-PAC PT 6 Clicks Mobility   Outcome Measure  Help needed turning from your back to your side while in a flat bed without using bedrails?: A Little Help needed moving from lying on your back to sitting on  the side of a flat bed without using bedrails?: A Little Help needed moving to and from a bed to a chair (including a wheelchair)?: A Little Help needed standing up from a chair using your arms (e.g., wheelchair or bedside chair)?: A Little Help needed to walk in hospital room?: A Little Help needed climbing 3-5 steps with a railing? : A Little 6 Click Score: 18    End of Session Equipment Utilized During Treatment: Gait belt Activity Tolerance: Patient tolerated treatment well Patient left: in bed;with call bell/phone within reach;with bed alarm set;with family/visitor present Nurse Communication: Mobility status PT Visit Diagnosis: Other abnormalities of gait and mobility (R26.89);Difficulty in walking, not elsewhere classified (R26.2);Pain     Time: 8472-8446 PT Time Calculation (min) (ACUTE ONLY): 26 min  Charges:    $Gait Training: 8-22 mins $Therapeutic Activity: 8-22 mins PT General Charges $$ ACUTE PT VISIT: 1 Visit                     Darice RAMAN., PT Office [phone] Acute Rehab 01/07/2024    Darice LITTIE Sharps 01/07/2024, 4:04 PM

## 2024-01-07 NOTE — Assessment & Plan Note (Addendum)
 Patient has cardioverted since the initiation of amiodarone  1/9.  Patient now receiving oral amiodarone  per cardiology's recommendation. Patient is being transitioned to Eliquis  for likely lifelong anticoagulation for CHA2DS2-VASc of 4. Digoxin  discontinued Metoprolol  50 mg twice daily Cardiology following TSH unremarkable.  Echocardiogram reveals preserved ejection fraction with no significant valvular abnormality

## 2024-01-07 NOTE — Assessment & Plan Note (Signed)
 Resolved

## 2024-01-07 NOTE — Assessment & Plan Note (Signed)
 Resumed statin therapy with atorvastatin 20 mg daily.

## 2024-01-07 NOTE — Progress Notes (Signed)
    9 Days Post-Op  Subjective: Feels well this morning. Having loose stools. Tolerating solid food. Wants to go home. HR remains well-controlled. Creatinine continues to improve, good UOP. Ambulated twice yesterday.   Objective: Vital signs in last 24 hours: Temp:  [97.4 F (36.3 C)-98.6 F (37 C)] 98.4 F (36.9 C) (01/11 0453) Pulse Rate:  [63-80] 68 (01/11 0453) Resp:  [14-24] 18 (01/11 0453) BP: (97-135)/(56-85) 108/62 (01/11 0453) SpO2:  [95 %-100 %] 100 % (01/11 0453) Weight:  [83.7 kg] 83.7 kg (01/11 0500) Last BM Date : 01/06/24  Intake/Output from previous day: 01/10 0701 - 01/11 0700 In: 168.8 [I.V.:168.8] Out: 1300 [Urine:1300] Intake/Output this shift: No intake/output data recorded.  PE: General: resting comfortably, NAD Neuro: alert and oriented, no focal deficits Resp: normal work of breathing Abdomen: soft, nondistended, nontender to palpation. Incisions clean and dry. Ileal conduit productive of clear yellow urine. Extremities: warm and well-perfused   Lab Results:  Recent Labs    01/06/24 0536 01/07/24 0126  WBC 10.3 9.6  HGB 10.9* 9.2*  HCT 34.8* 28.2*  PLT 305 250   BMET Recent Labs    01/06/24 0536 01/07/24 0126  NA 138 137  K 4.0 3.8  CL 111 110  CO2 20* 20*  GLUCOSE 111* 103*  BUN 43* 42*  CREATININE 1.44* 1.39*  CALCIUM  8.3* 7.9*   PT/INR No results for input(s): LABPROT, INR in the last 72 hours. CMP     Component Value Date/Time   NA 137 01/07/2024 0126   NA 142 08/24/2019 1415   K 3.8 01/07/2024 0126   CL 110 01/07/2024 0126   CO2 20 (L) 01/07/2024 0126   GLUCOSE 103 (H) 01/07/2024 0126   BUN 42 (H) 01/07/2024 0126   BUN 36 (H) 08/24/2019 1415   CREATININE 1.39 (H) 01/07/2024 0126   CREATININE 1.76 (H) 01/17/2023 1153   CREATININE 1.25 09/26/2014 0921   CALCIUM  7.9 (L) 01/07/2024 0126   CALCIUM  8.6 12/03/2020 0935   PROT 5.3 (L) 01/07/2024 0126   ALBUMIN  2.6 (L) 01/07/2024 0126   AST 25 01/07/2024 0126    AST 22 01/17/2023 1153   ALT 20 01/07/2024 0126   ALT 21 01/17/2023 1153   ALKPHOS 52 01/07/2024 0126   BILITOT 0.8 01/07/2024 0126   BILITOT 0.5 01/17/2023 1153   GFRNONAA 52 (L) 01/07/2024 0126   GFRNONAA 40 (L) 01/17/2023 1153   GFRAA >60 09/23/2020 1149   Lipase  No results found for: LIPASE        Assessment/Plan 78 yo male with ventral incisional hernia, POD9 s/p robotic TAR and parastomal hernia repair.  - Continue regular diet - AKI improved, repeat BMP tomorrow morning - A-fib: on oral amiodarone , metoprolol  and Eliquis  per cardiology - Ambulate - VTE: Eliquis , SCDs - Dispo: inpatient, possible discharge home tomorrow    LOS: 9 days    Leonor Dawn, MD [facility] General, Hepatobiliary and Pancreatic Surgery 01/07/24 9:59 AM

## 2024-01-07 NOTE — Assessment & Plan Note (Addendum)
 Baseline creatinine 1.5-1.7 Creatinine now 1.39, down from 2.14 during this hospitalization Continuing to monitor renal function with serial chemistries

## 2024-01-07 NOTE — Progress Notes (Signed)
 PT Note: Noted new PT order. Pt already on caseload and ambulated yesterday with RW and CGA 350'. Please see yesterday's PT note for further details. Will attempt to see patient today as schedule permits.  Darice RAMAN., PT Office [phone] Acute Rehab 01/07/2024

## 2024-01-07 NOTE — Assessment & Plan Note (Signed)
 Status post operative intervention as noted above Postoperative management per general surgery

## 2024-01-07 NOTE — Assessment & Plan Note (Addendum)
 Management per general surgery TPN was initiated on 1/8 but was later discontinued on 1/9 with improving ileus and tolerance of food intake.   From a medicine standpoint, all of patient's medical issues are essentially improved enough for discharge at surgery's discretion.

## 2024-01-07 NOTE — Assessment & Plan Note (Signed)
 Back on statin therapy now the patient is tolerating oral intake  We will inquire with cardiology about resumption of aspirin Chest pain-free Monitoring patient on telemetry

## 2024-01-07 NOTE — Progress Notes (Signed)
 PROGRESS NOTE   Hunter [name]  FMW:[id number] DOB: October 27, 1946 DOA: 12/29/2023 PCP: Paola Greig CROME, FNP   Date of Service: the patient was seen and examined on 01/07/2024  Brief Narrative:  78 year old man PMH CAD (s/p CABG in 2013 with LIMA-LAD, SVG-OM1-OM2 and SVG-PDA), paroxysmal atrial fibrillation, NSVT, chronic kidney disease stage IIIb, hypertension, hyperlipidemia, gastroesophageal reflux disease, gout who presented to [facility] on 1/2 for robotic assisted ventral incisional abdominal wall hernia and parastomal hernia repair admitted postoperatively to the general surgery service with Dr. Sheldon.    In the days following the surgery the patient began to develop multiple complications including acute kidney injury superimposed upon his chronic kidney disease stage IIIb for which the hospitalist group was consulted as well as developing rapid atrial fibrillation for which cardiology was consulted.   Atrial fibrillation had proven to be difficult to manage, initially not responding well to diltiazem  or metoprolol .  Attempts were then made to load patient with digoxin  on 1/8 and after that was unsuccessful patient and patient began to convert into rapid atrial flutter they were placed on an amiodarone  infusion morning of 1/9 which proved to be effective.  Patient was initially anticoagulated with heparin  and later transitioned to Lovenox  due to elevated CHA2DS2-VASc score.  Patient's acute kidney injury gradually did improve with improving clinical condition and intravenous hydration.  Due to prolonged postoperative ileus patient was initiated on TPN on 1/8 for a brief period of time but with improving bowel function diet was advanced on 1/9 and TPN was discontinued.    Assessment & Plan Ileus (HCC) Management per general surgery TPN was initiated on 1/8 but was later discontinued on 1/9 with improving ileus and tolerance of food intake.   From a medicine standpoint, all of  patient's medical issues are essentially improved enough for discharge at surgery's discretion.  Protein-calorie malnutrition, severe (HCC) Patient's biggest challenge now is maintaining adequate oral intake due to poor appetite  Continuing to encourage oral intake  Would recommend discontinuing dietary restrictions and just witching to a regular diet going forward so that patient is more likely to eat. Ensure supplements twice daily. TPN discontinued 1/9. Acute renal failure superimposed on stage 3b chronic kidney disease (HCC) Baseline creatinine 1.5-1.7 Creatinine now 1.39, down from 2.14 during this hospitalization Continuing to monitor renal function with serial chemistries Paroxysmal atrial fibrillation [facility]) Patient has cardioverted since the initiation of amiodarone  1/9.  Patient now receiving oral amiodarone  per cardiology's recommendation. Patient is being transitioned to Eliquis  for likely lifelong anticoagulation for CHA2DS2-VASc of 4. Digoxin  discontinued Metoprolol  50 mg twice daily Cardiology following TSH unremarkable.  Echocardiogram reveals preserved ejection fraction with no significant valvular abnormality  Normocytic anemia No current clinical evidence of bleeding Hemoglobin now stable Work up revealing mild iron deficiency, now receiving low-dose daily iron therapy which can be continued at time of discharge  CAD s/p CABG Back on statin therapy now the patient is tolerating oral intake  We will inquire with cardiology about resumption of aspirin  Chest pain-free Monitoring patient on telemetry NSVT (nonsustained ventricular tachycardia), 9 beats 12/23 No recent episodes Hyperlipidemia Resumed statin therapy with atorvastatin  20 mg daily. Incarcerated incisional hernia Status post operative intervention as noted above Postoperative management per general surgery Parastomal hernia of ileal conduit Status post operative intervention as noted  above Postoperative management per general surgery GOUT Allopurinol  resumed Acute metabolic encephalopathy Resolved     Subjective:  Patient reports poor appetite, stating that the food he is receiving is  not flavorful enough and that he gets full very easily.  Patient denies any abdominal pain preventing him from eating.  Physical Exam:  Vitals:   01/07/24 0500 01/07/24 1306 01/07/24 2052 01/07/24 2210  BP:  92/73 (!) 94/58 115/64  Pulse:  63 80 78  Resp:  20 20   Temp:  98.1 F (36.7 C) 98 F (36.7 C)   TempSrc:  Oral Oral   SpO2:  100% 100% 97%  Weight: 83.7 kg     Height:        Constitutional: Awake alert and oriented x3, no associated distress.   Skin: no rashes, no lesions, poor skin turgor noted. Eyes: Pupils are equally reactive to light.  No evidence of scleral icterus or conjunctival pallor.  ENMT: Moist mucous membranes noted.  Posterior pharynx clear of any exudate or lesions.   Respiratory: clear to auscultation bilaterally, no wheezing, no crackles. Normal respiratory effort. No accessory muscle use.  Cardiovascular: Regular rate and rhythm, no murmurs / rubs / gallops. No extremity edema. 2+ pedal pulses. No carotid bruits.  Abdomen: Soft abdomen.  Normal active bowel sounds.  Ostomy revealing pink mucosa with no evidence of blood. Musculoskeletal: No joint deformity upper and lower extremities. Good ROM, no contractures. Normal muscle tone.    Data Reviewed:  I have personally reviewed and interpreted labs, imaging.  Significant findings are   CBC: Recent Labs  Lab 01/02/24 0408 01/03/24 0309 01/05/24 0818 01/06/24 0536 01/07/24 0126  WBC 8.3 8.4 7.5 10.3 9.6  NEUTROABS  --   --   --   --  7.1  HGB 9.4* 10.0* 10.9* 10.9* 9.2*  HCT 28.5* 31.6* 34.7* 34.8* 28.2*  MCV 96.9 98.1 100.6* 99.1 98.3  PLT 169 207 255 305 250   Basic Metabolic Panel: Recent Labs  Lab 01/01/24 0402 01/01/24 0903 01/02/24 0408 01/03/24 0309 01/04/24 0428  01/04/24 0429 01/05/24 0818 01/06/24 0536 01/07/24 0126  NA  --    < > 136 136  --  140 142 138 137  K 5.0   < > 4.1 3.7  --  3.6 4.3 4.0 3.8  CL  --    < > 107 107  --  108 112* 111 110  CO2  --    < > 20* 21*  --  21* 22 20* 20*  GLUCOSE  --    < > 134* 120*  --  141* 127* 111* 103*  BUN  --    < > 40* 41*  --  53* 50* 43* 42*  CREATININE 2.10*   < > 1.94* 1.68*  --  1.69* 1.60* 1.44* 1.39*  CALCIUM   --    < > 8.3* 8.2*  --  8.1* 8.1* 8.3* 7.9*  MG 2.1  --  2.2  --  2.5*  --  2.2  --  2.0  PHOS  --   --   --   --  3.1  --  2.7  --   --    < > = values in this interval not displayed.   GFR: Estimated Creatinine Clearance: 44.5 mL/min (A) (by C-G formula based on SCr of 1.39 mg/dL (H)). Liver Function Tests: Recent Labs  Lab 01/01/24 0903 01/02/24 0408 01/05/24 0818 01/06/24 0536 01/07/24 0126  AST 21 19 17  37 25  ALT 7 8 12 22 20   ALKPHOS 58 47 50 57 52  BILITOT 0.6 0.8 0.7 0.9 0.8  PROT 5.5* 5.8* 5.9* 6.3* 5.3*  ALBUMIN  2.4*  3.2* 2.9* 3.0* 2.6*    Coagulation Profile: Recent Labs  Lab 01/01/24 1302  INR 1.2     Telemetry: Personally reviewed.  Rhythm is normal sinus rhythm.  Code Status:  Full code.  Code status decision has been confirmed with: patient Family Communication: Plan of care discussed with wife at the bedside   Severity of Illness:  The appropriate patient status for this patient is INPATIENT. Inpatient status is judged to be reasonable and necessary in order to provide the required intensity of service to ensure the patient's safety. The patient's presenting symptoms, physical exam findings, and initial radiographic and laboratory data in the context of their chronic comorbidities is felt to place them at high risk for further clinical deterioration. Furthermore, it is not anticipated that the patient will be medically stable for discharge from the hospital within 2 midnights of admission.   * I certify that at the point of admission it is my  clinical judgment that the patient will require inpatient hospital care spanning beyond 2 midnights from the point of admission due to high intensity of service, high risk for further deterioration and high frequency of surveillance required.*  Time spent:  36 minutes  Author:  Zachary JINNY Ba MD  01/07/2024 10:31 PM

## 2024-01-07 NOTE — Assessment & Plan Note (Signed)
 No recent episodes

## 2024-01-07 NOTE — Assessment & Plan Note (Addendum)
 No current clinical evidence of bleeding Hemoglobin now stable Work up revealing mild iron deficiency, now receiving low-dose daily iron therapy which can be continued at time of discharge

## 2024-01-07 NOTE — Assessment & Plan Note (Addendum)
 Patient's biggest challenge now is maintaining adequate oral intake due to poor appetite  Continuing to encourage oral intake  Would recommend discontinuing dietary restrictions and just witching to a regular diet going forward so that patient is more likely to eat. Ensure supplements twice daily. TPN discontinued 1/9.

## 2024-01-07 NOTE — Plan of Care (Signed)
  Problem: Education: Goal: Knowledge of General Education information will improve Description: Including pain rating scale, medication(s)/side effects and non-pharmacologic comfort measures Outcome: Progressing   Problem: Clinical Measurements: Goal: Ability to maintain clinical measurements within normal limits will improve Outcome: Progressing   Problem: Activity: Goal: Risk for activity intolerance will decrease Outcome: Progressing   Problem: Nutrition: Goal: Adequate nutrition will be maintained Outcome: Progressing   Problem: Elimination: Goal: Will not experience complications related to bowel motility Outcome: Progressing   

## 2024-01-07 NOTE — Progress Notes (Signed)
   Patient Name: Hunter [name] Date of Encounter: 01/07/2024 [facility] Cardiologist: Jerel Balding, MD   Interval Summary  .    Denies CP  Breathing is OK   Vital Signs .    Vitals:   01/06/24 2053 01/07/24 0150 01/07/24 0453 01/07/24 0500  BP: 112/62 112/60 108/62   Pulse: 80 65 68   Resp: 18 20 18    Temp: 98.6 F (37 C) (!) 97.4 F (36.3 C) 98.4 F (36.9 C)   TempSrc: Oral Oral Oral   SpO2: 97% 100% 100%   Weight:    83.7 kg  Height:        Intake/Output Summary (Last 24 hours) at 01/07/2024 0835 Last data filed at 01/07/2024 0544 Gross per 24 hour  Intake 168.76 ml  Output 1300 ml  Net -1131.24 ml      01/07/2024    5:00 AM 01/06/2024    1:05 AM 01/06/2024   12:42 AM  Last 3 Weights  Weight (lbs) 184 lb 8.4 oz 190 lb 0.6 oz 190 lb 0.6 oz  Weight (kg) 83.7 kg 86.2 kg 86.2 kg      Telemetry/ECG    SR - Personally Reviewed  Physical Exam .    VS:  BP 108/62 (BP Location: Right Arm)   Pulse 68   Temp 98.4 F (36.9 C) (Oral)   Resp 18   Ht 5' 9 (1.753 m)   Wt 83.7 kg   SpO2 100%   BMI 27.25 kg/m  , BMI Body mass index is 27.25 kg/m. GENERALPt in NAD   NECK:  No jugular venous distention, LUNGS:  Clear to auscultation bilaterally HEART:  RRR. No murmurs  EXT:  NO LE edema  Assessment & Plan .     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  Postoperative ileus requiring NG tube placement.  Cardiology consulted for atrial fibrillation with RVR.  # Atrial fibrillation with RVR:  Pt remains in SR   On amiodarone      This is a second episode of atrial fibrillation postoperatively.  Recommend continuing Eliquis  indefinitely.  We will treat with amiodarone  for 1 month and then discontinue as an outpatient.  Continue loading with 200 mg twice daily for 10 days then 200 mg daily.   Continue oral metoprolol .  This patients CHA2DS2-VASc Score and unadjusted Ischemic Stroke Rate (% per year) is equal to 4.8 % stroke rate/year from a score of 4  Above score calculated as 1 point each if present [CHF, HTN, DM, Vascular=MI/PAD/Aortic Plaque, Age if 65-74, or Male] Above score calculated as 2 points each if present [Age > 75, or Stroke/TIA/TE]  # CAD s/p CABG:  No symptoms of angina  Not on asa since on Eliquis   # Hyperlipidemia:   Continue simvastatin .      For questions or updates, please contact [facility] Please consult [email or web address] for contact info under        Signed, Vina Gull, MD

## 2024-01-08 LAB — BASIC METABOLIC PANEL
Anion gap: 6 (ref 5–15)
BUN: 34 mg/dL — ABNORMAL HIGH (ref 8–23)
CO2: 21 mmol/L — ABNORMAL LOW (ref 22–32)
Calcium: 7.6 mg/dL — ABNORMAL LOW (ref 8.9–10.3)
Chloride: 110 mmol/L (ref 98–111)
Creatinine, Ser: 1.88 mg/dL — ABNORMAL HIGH (ref 0.61–1.24)
GFR, Estimated: 36 mL/min — ABNORMAL LOW (ref >60)
Glucose, Bld: 104 mg/dL — ABNORMAL HIGH (ref 70–99)
Potassium: 3.6 mmol/L (ref 3.5–5.1)
Sodium: 137 mmol/L (ref 135–145)

## 2024-01-08 MED ORDER — HEPARIN SOD (PORK) LOCK FLUSH 100 UNIT/ML IV SOLN
500.0000 [IU] | INTRAVENOUS | Status: DC | PRN
  Filled 2024-01-08: qty 5

## 2024-01-08 MED ORDER — AMIODARONE HCL 200 MG PO TABS: ORAL_TABLET | ORAL | 0 refills | Status: DC

## 2024-01-08 MED ORDER — CALCIUM POLYCARBOPHIL 625 MG PO TABS: 625.0000 mg | ORAL_TABLET | Freq: Two times a day (BID) | ORAL | 0 refills | Status: DC

## 2024-01-08 MED ORDER — METHOCARBAMOL 1000 MG PO TABS: 1000.0000 mg | ORAL_TABLET | Freq: Three times a day (TID) | ORAL | 0 refills | Status: DC | PRN

## 2024-01-08 MED ORDER — APIXABAN 5 MG PO TABS: 5.0000 mg | ORAL_TABLET | Freq: Two times a day (BID) | ORAL | 11 refills | Status: DC

## 2024-01-08 NOTE — TOC Transition Note (Signed)
 Transition of Care [facility]) - Discharge Note   Patient Details  Name: Hunter [name] MRN: [id number] Date of Birth: 1946/06/13  Transition of Care [facility]) CM/SW Contact:  Dalila Camellia SAUNDERS, LCSW Phone Number: 01/08/2024, 10:24 AM   Clinical Narrative:     Patient will be going home with home health through [facility].  TOC signing off please reconsult with any other TOC needs, home health agency has been notified of planned discharge.   Final next level of care: Home w Home Health Services Barriers to Discharge: Barriers Resolved   Patient Goals and CMS Choice Patient states their goals for this hospitalization and ongoing recovery are:: To return back home. CMS Medicare.gov Compare Post Acute Care list provided to:: Patient Choice offered to / list presented to : Patient [place] ownership interest in [facility].provided to:: Patient    Discharge Placement                       Discharge Plan and Services Additional resources added to the After Visit Summary for                            [facility] Arranged: PT [facility] Agency: [facility] Date [facility] Agency Contacted: 01/08/24 Time HH Agency Contacted: 1022 Representative spoke with at [facility] Agency: [place]  Social Drivers of Health (SDOH) Interventions SDOH Screenings   Food Insecurity: No Food Insecurity (12/29/2023)  Housing: Low Risk  (12/29/2023)  Transportation Needs: No Transportation Needs (12/29/2023)  Utilities: Not At Risk (12/29/2023)  Depression (PHQ2-9): Low Risk  (02/28/2023)  Social Connections: Unknown (12/29/2023)  Tobacco Use: Low Risk  (12/29/2023)     Readmission Risk Interventions     No data to display

## 2024-01-08 NOTE — Progress Notes (Signed)
 AVS and discharge instructions reviewed w/ patient and patient's spouse. Both parties verbalized understanding.

## 2024-01-08 NOTE — Progress Notes (Addendum)
    10 Days Post-Op  Subjective: Tolerating solid food. Reports he did not sleep well. HR remains well-controlled. Creatinine up slightly to 1.8, which on chart review is near his baseline. Patient requesting to go home today.   Objective: Vital signs in last 24 hours: Temp:  [97.9 F (36.6 C)-98.1 F (36.7 C)] 97.9 F (36.6 C) (01/12 0431) Pulse Rate:  [63-80] 70 (01/12 0431) Resp:  [20] 20 (01/12 0431) BP: (92-115)/(54-73) 99/54 (01/12 0431) SpO2:  [97 %-100 %] 98 % (01/12 0431) Weight:  [83.7 kg] 83.7 kg (01/12 0431) Last BM Date : 01/07/24  Intake/Output from previous day: 01/11 0701 - 01/12 0700 In: 243 [P.O.:240; I.V.:3] Out: 1300 [Urine:1300] Intake/Output this shift: No intake/output data recorded.  PE: General: resting comfortably, NAD Neuro: alert and oriented, no focal deficits Resp: normal work of breathing on room air Abdomen: soft, nondistended, nontender to palpation. Incisions clean and dry. Ileal conduit productive of clear yellow urine. Extremities: warm and well-perfused   Lab Results:  Recent Labs    01/06/24 0536 01/07/24 0126  WBC 10.3 9.6  HGB 10.9* 9.2*  HCT 34.8* 28.2*  PLT 305 250   BMET Recent Labs    01/07/24 0126 01/08/24 0335  NA 137 137  K 3.8 3.6  CL 110 110  CO2 20* 21*  GLUCOSE 103* 104*  BUN 42* 34*  CREATININE 1.39* 1.88*  CALCIUM  7.9* 7.6*   PT/INR No results for input(s): LABPROT, INR in the last 72 hours. CMP     Component Value Date/Time   NA 137 01/08/2024 0335   NA 142 08/24/2019 1415   K 3.6 01/08/2024 0335   CL 110 01/08/2024 0335   CO2 21 (L) 01/08/2024 0335   GLUCOSE 104 (H) 01/08/2024 0335   BUN 34 (H) 01/08/2024 0335   BUN 36 (H) 08/24/2019 1415   CREATININE 1.88 (H) 01/08/2024 0335   CREATININE 1.76 (H) 01/17/2023 1153   CREATININE 1.25 09/26/2014 0921   CALCIUM  7.6 (L) 01/08/2024 0335   CALCIUM  8.6 12/03/2020 0935   PROT 5.3 (L) 01/07/2024 0126   ALBUMIN  2.6 (L) 01/07/2024 0126   AST  25 01/07/2024 0126   AST 22 01/17/2023 1153   ALT 20 01/07/2024 0126   ALT 21 01/17/2023 1153   ALKPHOS 52 01/07/2024 0126   BILITOT 0.8 01/07/2024 0126   BILITOT 0.5 01/17/2023 1153   GFRNONAA 36 (L) 01/08/2024 0335   GFRNONAA 40 (L) 01/17/2023 1153   GFRAA >60 09/23/2020 1149   Lipase  No results found for: LIPASE        Assessment/Plan 78 yo male with ventral incisional hernia, POD10 s/p robotic TAR and parastomal hernia repair.  - Continue regular diet - AKI resolved, creatinine at baseline - A-fib: on oral amiodarone , metoprolol  and Eliquis  per cardiology - Ambulate - VTE: Eliquis , SCDs - Dispo: Patient is medically stable for discharge. Having bowel function and tolerating regular diet. Will discharge home today. Will contact TOC to arrange HHPT.    LOS: 10 days    Leonor Dawn, MD [facility] General, Hepatobiliary and Pancreatic Surgery 01/08/24 8:52 AM

## 2024-01-08 NOTE — Plan of Care (Signed)

## 2024-01-08 NOTE — Progress Notes (Signed)
   Patient Name: Zae Kirtz Date of Encounter: 01/08/2024 [facility] Cardiologist: Jerel Balding, MD   Interval Summary  .   Breathing is OK  No CP    Vital Signs .    Vitals:   01/07/24 1306 01/07/24 2052 01/07/24 2210 01/08/24 0431  BP: 92/73 (!) 94/58 115/64 (!) 99/54  Pulse: 63 80 78 70  Resp: 20 20  20   Temp: 98.1 F (36.7 C) 98 F (36.7 C)  97.9 F (36.6 C)  TempSrc: Oral Oral  Oral  SpO2: 100% 100% 97% 98%  Weight:    83.7 kg  Height:        Intake/Output Summary (Last 24 hours) at 01/08/2024 0651 Last data filed at 01/08/2024 9367 Gross per 24 hour  Intake 243 ml  Output 1300 ml  Net -1057 ml      01/08/2024    4:31 AM 01/07/2024    5:00 AM 01/06/2024    1:05 AM  Last 3 Weights  Weight (lbs) 184 lb 8.4 oz 184 lb 8.4 oz 190 lb 0.6 oz  Weight (kg) 83.7 kg 83.7 kg 86.2 kg      Telemetry/ECG    SR and 1 hour of atrial fibrillation  Rates overall 60s to 100s  - Personally Reviewed  Physical Exam .    VS:  BP (!) 99/54 (BP Location: Right Arm)   Pulse 70   Temp 97.9 F (36.6 C) (Oral)   Resp 20   Ht 5' 9 (1.753 m)   Wt 83.7 kg   SpO2 98%   BMI 27.25 kg/m  , BMI Body mass index is 27.25 kg/m. GENERALPt in NAD   NECK: JVP is normal  LUNGS:  Clear to auscultation  HEART:  RRR. No murmurs  EXT:  NO LE edema  Assessment & Plan .     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  Postoperative ileus requiring NG tube placement.  Cardiology consulted for atrial fibrillation with RVR.  # Atrial fibrillation with RVR:  Pt had 1 hour of afib today  Rates oK    Keep on amiodarone    200 bid for 10 days then 200 daily     Keep on metoprolol  Keep on Eliquis    I will make sure he has appt in afib    Goal to get off of amiodarone  Recurrence today suggests he may go in and out in  long term, which would be ok if rates not bad and if remains on Eliquis      # CAD s/p CABG:  No symptoms of angina  Not on asa since on Eliquis    # Hyperlipidemia:   Continue simvastatin .    I will arrange outpt follow up   OK to dc from cardiac standpoint  For questions or updates, please contact [facility] Please consult [email or web address] for contact info under        Signed, Vina Gull, MD

## 2024-01-09 NOTE — Discharge Summary (Signed)
 Physician Discharge Summary    Hunter [name] MRN: [id number] DOB/AGE: 78/18/1947 = 78 y.o.  Patient Care Team: Paola Greig CROME, FNP as PCP - General (Family Medicine) Croitoru, Jerel, MD as PCP - Cardiology (Cardiology) Croitoru, Jerel, MD as Attending Physician (Cardiology) Mayo, Darice LABOR, FNP as WOC Nurse (Nurse Practitioner) Rogers Hai, MD as Medical Oncologist (Medical Oncology) Alvaro Ricardo KATHEE Mickey., MD as Consulting Physician (Urology) Colon Shove, MD as Consulting Physician (Neurosurgery) Sheldon Standing, MD as Consulting Physician (General Surgery)  Admit date: 12/29/2023  Discharge date: 01/08/2024 Hospital Stay = 9 days    Discharge Diagnoses:  Principal Problem:   Incarcerated incisional hernia Active Problems:   Paroxysmal atrial fibrillation (HCC)   Hyperlipidemia   GOUT   MONONEURITIS OF UNSPECIFIED SITE   Essential hypertension   GERD   CAD s/p CABG   S/P CABG x 4, 12/12/12, LIMA-LAD;VG-OM1,OM2;VG-PD   NSVT (nonsustained ventricular tachycardia), 9 beats 12/23   Port-A-Cath in place   Parastomal hernia of ileal conduit   History of poliomyelitis   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   CKD stage 3b, GFR 30-44 ml/min (HCC)   Acute renal failure superimposed on stage 3b chronic kidney disease (HCC)   Normocytic anemia   Protein-calorie malnutrition, severe (HCC)   Ileus (HCC)   Acute metabolic encephalopathy   Small bowel obstruction (HCC)  SURGERY 12/29/2023   POST-OPERATIVE DIAGNOSIS:   Ventral incisional abdominal wall hernias 13x5cm region - incarcerated Parastomal ileal conduit hernia - incarcerated    PROCEDURE:   ROBOTIC TRANSVERSUS ABDOMINIS RELEASE (TAR) COMPONENT SEPARATION - UNILATERAL ROBOTIC REPAIR OF ABDOMINAL HERNIA WITH MESH  ROBOTIC PARASTOMAL HERNIA REPAIR OF ILEAL CONDUIT WITH MESH (SUGARBAKER) ROBOTIC LYSIS OD ADHESIONS COLON REPAIR TAP BLOCK - BILATERAL   SURGEON:  Standing KYM Sheldon, MD   OR FINDINGS:  Parastomal hernia around ileal conduit incarcerated with omentum.  5 x 4 cm region.  Primarily closed.   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  13 x 5 cm region.  Right sided component separation TAR release done to allow largest 8x5cm defect to be primarily closed   Type of ventral wall repair:  Laparoscopic underlay repair .with Primary repair of largest hernia Placement of mesh: Retrorectus / Preperitoneal underlay repair Name of mesh: Bard Ventralight dual sided (polypropylene / Seprafilm) Size of mesh: 33x27cm Orientation:  Mostly vertical with slightly oblique to right lower quadrant Mesh overlap:  5-7cm     Consults: Case Management / Social Work, Physical Therapy, Occupational Therapy, Pharmacy, Nutrition, Wound ostomy consult nurse (WOCN), Internal Medicine (Hospitalist), Critical Care Medicine, and Cardiology  Hospital Course:   Pleasant patient with history of cancer requiring cystoprostatectomy and ureteral ileal conduit urostomy.  Developed hernias at his midline incision as well as stoma containing small bowel.  Surgical consultation made.  The day of admission, the patient underwent the surgery above.    Patient had some right upper extremity weakness given his history of postpolio syndrome that gradually resolved.  Had some mild sleepiness and confusion.  Unfortunately had some nausea and popped into atrial fibrillation which has happened before.  He was transferred to stepdown unit.  Attempts were made to control this via enteral and IV routes but eventually was able to be controlled with IV amiodarone .  Patient developed ileus requires a nasogastric tube decompression.  Patient did have elevated creatinine from his baseline of chronic kidney disease but was never oliguric.  That normalized.  Patient began to have some bowel function.  Some confusion resolved as well.    Eventually tolerated nasogastric tube clamping trial.  The patient  gradually mobilized and advanced to a solid diet.  Pain and other symptoms were treated aggressively.  Given the recurrent episode of postoperative paroxysmal atrial fibrillation, cardiology recommended anticoagulation.  Initially on heparin  drip that was switched to Lovenox  injections given IV access.  Eventually switched over to oral Eliquis .  By the time of discharge, the patient was walking well the hallways, eating food, having flatus.  Pain was well-controlled on an oral medications.  Based on meeting discharge criteria and continuing to recover; medicine, cardiology and surgery felt it was safe for the patient to be discharged from the hospital to further recover with close followup. Postoperative recommendations were discussed in detail.  They are written as well.  Discharged Condition: good  Discharge Exam: Blood pressure (!) 112/55, pulse 69, temperature 97.9 F (36.6 C), temperature source Oral, resp. rate 20, height 5' 9 (1.753 m), weight 83.7 kg, SpO2 98%.  General: Pt awake/alert/oriented x4 in No acute distress Eyes: PERRL, normal EOM.  Sclera clear.  No icterus Neuro: CN II-XII intact w/o focal sensory/motor deficits. Lymph: No head/neck/groin lymphadenopathy Psych:  No delerium/psychosis/paranoia HENT: Normocephalic, Mucus membranes moist.  No thrush Neck: Supple, No tracheal deviation Chest:  No chest wall pain w good excursion CV:  Pulses intact.  Regular rhythm MS: Normal AROM mjr joints.  No obvious deformity Abdomen: Soft.  Nondistended.  Mildly tender at incisions only.  Urostomy right lower quadrant pink with clear lightly yellow urine in bag.  Minimal ecchymosis on mons pubis.  Incisions with normal healing ridges.  No evidence of peritonitis.  No incarcerated hernias. Ext:  SCDs BLE.  No mjr edema.  No cyanosis Skin: No petechiae / purpura   Disposition:    Follow-up Information     Sheldon Standing, MD Follow up on 01/23/2024.   Specialties: General Surgery,  Colon and Rectal Surgery Contact information: [address] [place] [address] [phone]         Care, [facility] Follow up.   Why: Home Health Contact information: [address] [place] [address] [phone]         Terra Fairy PARAS, PA-C Follow up.   Specialty: Cardiology Why: Hospital Follow Up at [facility] at 8:30 AM - Please arrive 15 mins early Contact information: [address] [place] [address] [phone]                 Discharge disposition: 06-Home-Health Care Svc       Discharge Instructions     Amb referral to [facility]   Complete by: As directed    Call MD for:   Complete by: As directed    FEVER > 101.5 F  (temperatures < 101.5 F are not significant)   Call MD for:  extreme fatigue   Complete by: As directed    Call MD for:  persistant dizziness or light-headedness   Complete by: As directed    Call MD for:  persistant nausea and vomiting   Complete by: As directed    Call MD for:  persistant nausea and vomiting   Complete by: As directed    Call MD for:  redness, tenderness, or signs of infection (pain, swelling, redness, odor or green/yellow discharge around incision site)   Complete by: As directed    Call MD for:  redness, tenderness, or signs of infection (pain, swelling,  redness, odor or green/yellow discharge around incision site)   Complete by: As directed    Call MD for:  severe uncontrolled pain   Complete by: As directed    Call MD for:  severe uncontrolled pain   Complete by: As directed    Call MD for:  temperature >100.4   Complete by: As directed    Diet - low sodium heart healthy   Complete by: As directed    Start with a bland diet such as soups, liquids, starchy foods, low fat foods, etc. the first few days at home. Gradually advance to a solid, low-fat, high fiber diet by the end of the first week at home.   Add a fiber supplement to your diet (Metamucil,  etc) If you feel full, bloated, or constipated, stay on a full liquid or pureed/blenderized diet for a few days until you feel better and are no longer constipated.   Diet - low sodium heart healthy   Complete by: As directed    Discharge instructions   Complete by: As directed    See Discharge Instructions If you are not getting better after two weeks or are noticing you are getting worse, contact our office [phone] for further advice.  We may need to adjust your medications, re-evaluate you in the office, send you to the emergency room, or see what other things we can do to help. The clinic staff is available to answer your questions during regular business hours (8:30am-5pm).  Please don't hesitate to call and ask to speak to one of our nurses for clinical concerns.    A surgeon from [facility] is always on call at the hospitals 24 hours/day If you have a medical emergency, go to the nearest emergency room or call 911.   Discharge wound care:   Complete by: As directed    It is good for closed incisions and even open wounds to be washed every day.  Shower every day.  Short baths are fine.  Wash the incisions and wounds clean with soap & water .    You may leave closed incisions open to air if it is dry.   You may cover the incision with clean gauze & replace it after your daily shower for comfort.  TEGADERM:  You have clear gauze band-aid dressings over your closed incision(s).  Remove the dressings 2 days after surgery = 12/31/2023   Driving Restrictions   Complete by: As directed    You may drive when: - you are no longer taking narcotic prescription pain medication - you can comfortably wear a seatbelt - you can safely make sudden turns/stops without pain.   Increase activity slowly   Complete by: As directed    Start light daily activities --- self-care, walking, climbing stairs- beginning the day after surgery.  Gradually increase activities as tolerated.  Control  your pain to be active.  Stop when you are tired.  Ideally, walk several times a day, eventually an hour a day.   Most people are back to most day-to-day activities in a few weeks.  It takes 4-6 weeks to get back to unrestricted, intense activity. If you can walk 30 minutes without difficulty, it is safe to try more intense activity such as jogging, treadmill, bicycling, low-impact aerobics, swimming, etc. Save the most intensive and strenuous activity for last (Usually 4-8 weeks after surgery) such as sit-ups, heavy lifting, contact sports, etc.  Refrain from any intense heavy lifting or straining until you  are off narcotics for pain control.  You will have off days, but things should improve week-by-week. DO NOT PUSH THROUGH PAIN.  Let pain be your guide: If it hurts to do something, don't do it.   Increase activity slowly   Complete by: As directed    Lifting restrictions   Complete by: As directed    If you can walk 30 minutes without difficulty, it is safe to try more intense activity such as jogging, treadmill, bicycling, low-impact aerobics, swimming, etc. Save the most intensive and strenuous activity for last (Usually 4-8 weeks after surgery) such as sit-ups, heavy lifting, contact sports, etc.   Refrain from any intense heavy lifting or straining until you are off narcotics for pain control.  You will have off days, but things should improve week-by-week. DO NOT PUSH THROUGH PAIN.  Let pain be your guide: If it hurts to do something, don't do it.  Pain is your body warning you to avoid that activity for another week until the pain goes down.   May shower / Bathe   Complete by: As directed    May walk up steps   Complete by: As directed    Remove dressing in 48 hours   Complete by: As directed    Make sure all dressings have been removed on the second day after surgery = 1/4 Saturday Leave incisions open to air.  OK to cover incisions with gauze or bandages as desired   Sexual Activity  Restrictions   Complete by: As directed    You may have sexual intercourse when it is comfortable. If it hurts to do something, stop.       Allergies as of 01/08/2024       Reactions   Oxycodone  Other (See Comments)   Bad dreams and hallucinations        Medication List     TAKE these medications    acetaminophen  500 MG tablet Commonly known as: TYLENOL  Take 1,000 mg by mouth every 8 (eight) hours as needed.   allopurinol  300 MG tablet Commonly known as: ZYLOPRIM  Take 300 mg by mouth daily.   amiodarone  200 MG tablet Commonly known as: PACERONE  Take 1 tablet (200 mg total) by mouth 2 (two) times daily for 8 days, THEN 1 tablet (200 mg total) daily for 21 days. Start taking on: January 08, 2024   apixaban  5 MG Tabs tablet Commonly known as: ELIQUIS  Take 1 tablet (5 mg total) by mouth 2 (two) times daily.   ferrous sulfate  325 (65 FE) MG tablet Take 325 mg by mouth 3 (three) times a week.   fluticasone  50 MCG/ACT nasal spray Commonly known as: FLONASE  Place 2 sprays into both nostrils daily as needed for allergies.   HYDROcodone -acetaminophen  5-325 MG tablet Commonly known as: NORCO/VICODIN Take 1-2 tablets by mouth every 4 (four) hours as needed for moderate pain (pain score 4-6) or severe pain (pain score 7-10).   Methocarbamol  1000 MG Tabs Take 1,000 mg by mouth every 8 (eight) hours as needed for muscle spasms.   metoprolol  tartrate 50 MG tablet Commonly known as: LOPRESSOR  Take 1 tablet (50 mg total) by mouth 2 (two) times daily.   polycarbophil 625 MG tablet Commonly known as: FIBERCON Take 1 tablet (625 mg total) by mouth 2 (two) times daily.   simvastatin  40 MG tablet Commonly known as: ZOCOR  TAKE 1 TABLET BY MOUTH EVERYDAY AT BEDTIME  Discharge Care Instructions  (From admission, onward)           Start     Ordered   12/30/23 0000  Discharge wound care:       Comments: It is good for closed incisions and even open  wounds to be washed every day.  Shower every day.  Short baths are fine.  Wash the incisions and wounds clean with soap & water .    You may leave closed incisions open to air if it is dry.   You may cover the incision with clean gauze & replace it after your daily shower for comfort.  TEGADERM:  You have clear gauze band-aid dressings over your closed incision(s).  Remove the dressings 2 days after surgery = 12/31/2023   12/30/23 1148            Significant Diagnostic Studies:  Results for orders placed or performed during the hospital encounter of 12/29/23 (from the past 72 hours)  CBC with Differential/Platelet     Status: Abnormal   Collection Time: 01/07/24  1:26 AM  Result Value Ref Range   WBC 9.6 4.0 - 10.5 K/uL   RBC 2.87 (L) 4.22 - 5.81 MIL/uL   Hemoglobin 9.2 (L) 13.0 - 17.0 g/dL   HCT 71.7 (L) 60.9 - 47.9 %   MCV 98.3 80.0 - 100.0 fL   MCH 32.1 26.0 - 34.0 pg   MCHC 32.6 30.0 - 36.0 g/dL   RDW 85.3 88.4 - 84.4 %   Platelets 250 150 - 400 K/uL   nRBC 0.0 0.0 - 0.2 %   Neutrophils Relative % 75 %   Neutro Abs 7.1 1.7 - 7.7 K/uL   Lymphocytes Relative 14 %   Lymphs Abs 1.3 0.7 - 4.0 K/uL   Monocytes Relative 6 %   Monocytes Absolute 0.6 0.1 - 1.0 K/uL   Eosinophils Relative 3 %   Eosinophils Absolute 0.3 0.0 - 0.5 K/uL   Basophils Relative 0 %   Basophils Absolute 0.0 0.0 - 0.1 K/uL   Immature Granulocytes 2 %   Abs Immature Granulocytes 0.16 (H) 0.00 - 0.07 K/uL    Comment: Performed at [facility], [address] [place], [place] [address]  Comprehensive metabolic panel     Status: Abnormal   Collection Time: 01/07/24  1:26 AM  Result Value Ref Range   Sodium 137 135 - 145 mmol/L   Potassium 3.8 3.5 - 5.1 mmol/L   Chloride 110 98 - 111 mmol/L   CO2 20 (L) 22 - 32 mmol/L   Glucose, Bld 103 (H) 70 - 99 mg/dL    Comment: Glucose reference range applies only to samples taken after fasting for at least 8 hours.   BUN 42 (H) 8 - 23 mg/dL    Creatinine, Ser 8.60 (H) 0.61 - 1.24 mg/dL   Calcium  7.9 (L) 8.9 - 10.3 mg/dL   Total Protein 5.3 (L) 6.5 - 8.1 g/dL   Albumin  2.6 (L) 3.5 - 5.0 g/dL   AST 25 15 - 41 U/L   ALT 20 0 - 44 U/L   Alkaline Phosphatase 52 38 - 126 U/L   Total Bilirubin 0.8 0.0 - 1.2 mg/dL   GFR, Estimated 52 (L) >60 mL/min    Comment: (NOTE) Calculated using the CKD-EPI Creatinine Equation (2021)    Anion gap 7 5 - 15    Comment: Performed at [facility], [address] [place], [place] [address]  Magnesium   Status: None   Collection Time: 01/07/24  1:26 AM  Result Value Ref Range   Magnesium  2.0 1.7 - 2.4 mg/dL    Comment: Performed at [facility], [address] [place], [place] [address]  Basic metabolic panel     Status: Abnormal   Collection Time: 01/08/24  3:35 AM  Result Value Ref Range   Sodium 137 135 - 145 mmol/L   Potassium 3.6 3.5 - 5.1 mmol/L   Chloride 110 98 - 111 mmol/L   CO2 21 (L) 22 - 32 mmol/L   Glucose, Bld 104 (H) 70 - 99 mg/dL    Comment: Glucose reference range applies only to samples taken after fasting for at least 8 hours.   BUN 34 (H) 8 - 23 mg/dL   Creatinine, Ser 8.11 (H) 0.61 - 1.24 mg/dL   Calcium  7.6 (L) 8.9 - 10.3 mg/dL   GFR, Estimated 36 (L) >60 mL/min    Comment: (NOTE) Calculated using the CKD-EPI Creatinine Equation (2021)    Anion gap 6 5 - 15    Comment: Performed at [facility], [address], [place], [place] [address]    No results found.  Past Medical History:  Diagnosis Date   Atrial fibrillation, rapid (HCC)    With high ventricular rates. Intol amio, was on Sotalol , now on BB   Bladder cancer (HCC) dx'd 10/2019   Bursitis of right shoulder    CAD (coronary artery disease) 11/2012   CABG x 4 using left internal mammary artery and right endovein harvest. (LIMA-LAD; VG-OM1, OM2, VG-PD), EF 55-60% at cath   Degenerative disc disease, lumbar    Dysrhythmia    Epigastric pain     Myoview 11/09/12 Showed evidence of reversible ischemia in both the inferior wall & the anteroapical distribution.    Erectile dysfunction    H/O ED while taking a beta-blocker   GERD (gastroesophageal reflux disease)    Gout    Hiatal hernia    History of kidney stones    Hyperlipidemia    Hypertension    Systemic HTN. Pt has H/O of ED when taking a beta-blocker. ECHO 11/26/08 Showed no evidence of any significant valvular disease, Estimated EF = 50-60%.   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 Amia intol. Was on Sotalol , now on Lopressor    Polio    Age 57. 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, LIMA-LAD;VG-OM1,OM2;VG-PD 12/15/2012   Seasonal allergies    Skin cancer (melanoma) (HCC) 08/04/2015   left side of face    Skin lesion of face    hx of    Past Surgical History:  Procedure Laterality Date   ANTERIOR CERVICAL DECOMPRESSION/DISCECTOMY FUSION 4 LEVELS N/A 04/11/2023   Procedure: Cervical three-four Cervical four-five Cervical five-six Cervical six-seven Anterior Cervical Decompression Fusion;  Surgeon: Colon Shove, MD;  Location: [facility] OR;  Service: Neurosurgery;  Laterality: N/A;   COLONOSCOPY     CORONARY ARTERY BYPASS GRAFT  12/12/2012   CABG x 4 using left internal mammary artery and right endovein harvest. (LIMA-LAD; VG-OM1, OM2, VG-PD)   CYSTOSCOPY WITH INJECTION N/A 02/04/2021   Procedure: CYSTOSCOPY WITH INJECTION OF INDOCYANINE GREEN DYE;  Surgeon: Alvaro Hummer, MD;  Location: [facility]S;  Service: Urology;  Laterality: N/A;   EYE SURGERY  1996   had lasik surgery bilaterally   IR IMAGING GUIDED PORT INSERTION  09/10/2020   LEFT HEART CATHETERIZATION WITH CORONARY ANGIOGRAM N/A 12/08/2012   Procedure: LEFT HEART CATHETERIZATION  WITH CORONARY ANGIOGRAM;  Surgeon: Jerel Balding, MD;  Location: [facility] CATH LAB;  Service: Cardiovascular;  Laterality: N/A;   LYMPHADENECTOMY Bilateral 02/04/2021   Procedure: LYMPHADENECTOMY;  Surgeon: Alvaro Hummer, MD;  Location: [facility];  Service: Urology;  Laterality: Bilateral;   ROBOT ASSISTED LAPAROSCOPIC COMPLETE CYSTECT ILEAL CONDUIT N/A 02/04/2021   Procedure: XI ROBOTIC ASSISTED LAPAROSCOPIC COMPLETE CYSTOPROSTATECTOMY ILEAL CONDUIT;  Surgeon: Alvaro Hummer, MD;  Location: [facility];  Service: Urology;  Laterality: N/A;  6 HRS   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   SKIN CANCER EXCISION  2016   left side of face    TRANSURETHRAL RESECTION OF BLADDER TUMOR N/A 11/16/2019   Procedure: TRANSURETHRAL RESECTION OF BLADDER TUMOR (TURBT) WITH INSTILLATION OF POST OPERATIVE CHEMOTHERAPY;  Surgeon: Ottelin, Mark, MD;  Location: [facility];  Service: Urology;  Laterality: N/A;   TRANSURETHRAL RESECTION OF BLADDER TUMOR WITH MITOMYCIN -C N/A 08/12/2020   Procedure: TRANSURETHRAL RESECTION OF BLADDER TUMOR WITH GEMCITABINE ;  Surgeon: Elisabeth Valli BIRCH, MD;  Location: [facility];  Service: Urology;  Laterality: N/A;  1 HR   UPPER GI ENDOSCOPY     XI ROBOTIC ASSISTED PARASTOMAL HERNIA REPAIR N/A 12/29/2023   Procedure: XI ROBOTIC ASSISTED PARASTOMAL HERNIA REPAIR WITH MESH, INCISIONAL AND ILEAL CONDUIT, LYSIS OF ADHESIONS, BILATERAL TAP BLOCK;  Surgeon: Sheldon Standing, MD;  Location: [facility];  Service: General;  Laterality: N/A;    Social History   Socioeconomic History   Marital status: Married    Spouse name: Not on file   Number of children: 3   Years of education: Not on file   Highest education level: Not on file  Occupational History   Occupation: Musician (RETIRED)    Employer: [employer]  Tobacco Use   Smoking status: Never   Smokeless tobacco: Never  Vaping Use   Vaping status: Never Used  Substance and Sexual Activity   Alcohol use: Yes    Comment: 1 wine or 1  margarita once a month   Drug use: No   Sexual activity: Not on file  Other Topics Concern   Not on file  Social History Narrative   Not on file   Social Drivers of Health   Financial Resource Strain: Not on file  Food Insecurity: No Food Insecurity (12/29/2023)   Hunger Vital Sign    Worried About Running Out of Food in the Last Year: Never true    Ran Out of Food in the Last Year: Never true  Transportation Needs: No Transportation Needs (12/29/2023)   PRAPARE - Administrator, Civil Service (Medical): No    Lack of Transportation (Non-Medical): No  Physical Activity: Not on file  Stress: Not on file  Social Connections: Unknown (12/29/2023)   Social Connection and Isolation Panel [NHANES]    Frequency of Communication with Friends and Family: More than three times a week    Frequency of Social Gatherings with Friends and Family: Once a week    Attends Religious Services: Patient unable to answer    Active Member of Clubs or Organizations: No    Attends Banker Meetings: Never    Marital Status: Married  Catering Manager Violence: Not At Risk (12/29/2023)  Humiliation, Afraid, Rape, and Kick questionnaire    Fear of Current or Ex-Partner: No    Emotionally Abused: No    Physically Abused: No    Sexually Abused: No    Family History  Problem Relation Age of Onset   CAD Father    Hypertension Father    AAA (abdominal aortic aneurysm) Father    Aneurysm Father        Cause of death   Heart attack Paternal Grandfather    Cancer Mother        Colon. Cause of death    No current facility-administered medications for this encounter.   Current Outpatient Medications  Medication Sig Dispense Refill   acetaminophen  (TYLENOL ) 500 MG tablet Take 1,000 mg by mouth every 8 (eight) hours as needed.     allopurinol  (ZYLOPRIM ) 300 MG tablet Take 300 mg by mouth daily.     ferrous sulfate  325 (65 FE) MG tablet Take 325 mg by mouth 3 (three) times a week.      fluticasone  (FLONASE ) 50 MCG/ACT nasal spray Place 2 sprays into both nostrils daily as needed for allergies.     metoprolol  tartrate (LOPRESSOR ) 50 MG tablet Take 1 tablet (50 mg total) by mouth 2 (two) times daily. 180 tablet 3   simvastatin  (ZOCOR ) 40 MG tablet TAKE 1 TABLET BY MOUTH EVERYDAY AT BEDTIME 90 tablet 3   amiodarone  (PACERONE ) 200 MG tablet Take 1 tablet (200 mg total) by mouth 2 (two) times daily for 8 days, THEN 1 tablet (200 mg total) daily for 21 days. 37 tablet 0   apixaban  (ELIQUIS ) 5 MG TABS tablet Take 1 tablet (5 mg total) by mouth 2 (two) times daily. 60 tablet 11   HYDROcodone -acetaminophen  (NORCO/VICODIN) 5-325 MG tablet Take 1-2 tablets by mouth every 4 (four) hours as needed for moderate pain (pain score 4-6) or severe pain (pain score 7-10). 30 tablet 0   methocarbamol  1000 MG TABS Take 1,000 mg by mouth every 8 (eight) hours as needed for muscle spasms. 20 tablet 0   polycarbophil (FIBERCON) 625 MG tablet Take 1 tablet (625 mg total) by mouth 2 (two) times daily. 60 tablet 0     Allergies  Allergen Reactions   Oxycodone  Other (See Comments)    Bad dreams and hallucinations    Signed:   Elspeth KYM Schultze, MD, FACS, MASCRS Esophageal, Gastrointestinal & Colorectal Surgery Robotic and Minimally Invasive Surgery  [facility] [address], [address] [place], [place] [address] [phone] Fax [phone] Main  CONTACT INFORMATION: Weekday (9AM-5PM): Call [facility] main office at [phone] Weeknight (5PM-9AM) or Weekend/Holiday: Check EPIC Web Links tab & use AMION (password  [email or web address]) for General Surgery [facility] coverage  Please, DO NOT use SecureChat  (it is not reliable communication to reach operating surgeons & will lead to a delay in care).   Epic staff messaging available for outptient concerns needing 1-2 business day response.      01/09/2024, 7:25 AM  01/09/2024

## 2024-01-25 ENCOUNTER — Encounter: Payer: Self-pay | Admitting: Cardiovascular Disease

## 2024-01-25 ENCOUNTER — Telehealth: Payer: Self-pay | Admitting: Cardiovascular Disease

## 2024-01-25 NOTE — Telephone Encounter (Signed)
Patient identification verified by 2 forms. Marilynn Rail, RN    Called and spoke to patients wife Dolores Patty states:   -Patient had a procedure on 12/29/23 and went into A.Fib   -patient was discharged with one month of Amiodarone and Eliquis  -would like to know if Dr. Royann Shivers agrees with Dr. Michaell Cowing referral to A.fib clinic  Informed Darl Pikes message sent to Dr. Royann Shivers for input/advisement    Per Chart review:

## 2024-01-25 NOTE — Telephone Encounter (Signed)
Wife Darl Pikes) stated patient was referred to the Afib Clinic and wants to know if this referral is strictly necessary.

## 2024-01-25 NOTE — Telephone Encounter (Signed)
Can we please get him an appointment with me in February please?

## 2024-01-26 NOTE — Telephone Encounter (Signed)
Please see documentation in 1/29 telephone encounter

## 2024-01-30 ENCOUNTER — Ambulatory Visit (HOSPITAL_COMMUNITY): Payer: Medicare Other | Admitting: Internal Medicine

## 2024-01-31 NOTE — Telephone Encounter (Signed)
Left message for patient to call, held slot in dr croitoru's schedule, 02/22/24 @ 8:20 am. Patient asked to call and confirm that is a good date and time for him.

## 2024-02-01 ENCOUNTER — Ambulatory Visit (HOSPITAL_COMMUNITY): Payer: Medicare Other | Admitting: Internal Medicine

## 2024-02-02 ENCOUNTER — Other Ambulatory Visit: Payer: Self-pay | Admitting: Cardiovascular Disease

## 2024-02-07 ENCOUNTER — Inpatient Hospital Stay: Payer: Medicare Other | Attending: Oncology

## 2024-02-07 VITALS — BP 141/74 | HR 77 | Temp 98.1°F | Resp 18

## 2024-02-07 DIAGNOSIS — C679 Malignant neoplasm of bladder, unspecified: Secondary | ICD-10-CM

## 2024-02-07 DIAGNOSIS — C61 Malignant neoplasm of prostate: Secondary | ICD-10-CM | POA: Insufficient documentation

## 2024-02-07 DIAGNOSIS — E611 Iron deficiency: Secondary | ICD-10-CM | POA: Diagnosis present

## 2024-02-07 DIAGNOSIS — D508 Other iron deficiency anemias: Secondary | ICD-10-CM

## 2024-02-07 LAB — COMPREHENSIVE METABOLIC PANEL
ALT: 12 U/L (ref 0–44)
AST: 18 U/L (ref 15–41)
Albumin: 3.2 g/dL — ABNORMAL LOW (ref 3.5–5.0)
Alkaline Phosphatase: 79 U/L (ref 38–126)
Anion gap: 11 (ref 5–15)
BUN: 24 mg/dL — ABNORMAL HIGH (ref 8–23)
CO2: 21 mmol/L — ABNORMAL LOW (ref 22–32)
Calcium: 8.7 mg/dL — ABNORMAL LOW (ref 8.9–10.3)
Chloride: 107 mmol/L (ref 98–111)
Creatinine, Ser: 1.79 mg/dL — ABNORMAL HIGH (ref 0.61–1.24)
GFR, Estimated: 39 mL/min — ABNORMAL LOW (ref >60)
Glucose, Bld: 97 mg/dL (ref 70–99)
Potassium: 3.8 mmol/L (ref 3.5–5.1)
Sodium: 139 mmol/L (ref 135–145)
Total Bilirubin: 0.5 mg/dL (ref 0.0–1.2)
Total Protein: 7 g/dL (ref 6.5–8.1)

## 2024-02-07 LAB — CBC WITH DIFFERENTIAL/PLATELET
Abs Immature Granulocytes: 0.02 10*3/uL (ref 0.00–0.07)
Basophils Absolute: 0 10*3/uL (ref 0.0–0.1)
Basophils Relative: 1 %
Eosinophils Absolute: 0.3 10*3/uL (ref 0.0–0.5)
Eosinophils Relative: 4 %
HCT: 29.6 % — ABNORMAL LOW (ref 39.0–52.0)
Hemoglobin: 9.4 g/dL — ABNORMAL LOW (ref 13.0–17.0)
Immature Granulocytes: 0 %
Lymphocytes Relative: 18 %
Lymphs Abs: 1.3 10*3/uL (ref 0.7–4.0)
MCH: 30.9 pg (ref 26.0–34.0)
MCHC: 31.8 g/dL (ref 30.0–36.0)
MCV: 97.4 fL (ref 80.0–100.0)
Monocytes Absolute: 0.4 10*3/uL (ref 0.1–1.0)
Monocytes Relative: 6 %
Neutro Abs: 5 10*3/uL (ref 1.7–7.7)
Neutrophils Relative %: 71 %
Platelets: 219 10*3/uL (ref 150–400)
RBC: 3.04 MIL/uL — ABNORMAL LOW (ref 4.22–5.81)
RDW: 14.7 % (ref 11.5–15.5)
WBC: 7 10*3/uL (ref 4.0–10.5)
nRBC: 0 % (ref 0.0–0.2)

## 2024-02-07 LAB — MAGNESIUM: Magnesium: 2.1 mg/dL (ref 1.7–2.4)

## 2024-02-07 LAB — IRON AND TIBC
Iron: 38 ug/dL — ABNORMAL LOW (ref 45–182)
Saturation Ratios: 14 % — ABNORMAL LOW (ref 17.9–39.5)
TIBC: 266 ug/dL (ref 250–450)
UIBC: 228 ug/dL

## 2024-02-07 LAB — FERRITIN: Ferritin: 228 ng/mL (ref 24–336)

## 2024-02-07 MED ORDER — SODIUM CHLORIDE 0.9% FLUSH
10.0000 mL | INTRAVENOUS | Status: DC | PRN
  Administered 2024-02-07: 10 mL via INTRAVENOUS

## 2024-02-07 MED ORDER — HEPARIN SOD (PORK) LOCK FLUSH 100 UNIT/ML IV SOLN
500.0000 [IU] | Freq: Once | INTRAVENOUS | Status: AC
  Administered 2024-02-07: 500 [IU] via INTRAVENOUS

## 2024-02-07 NOTE — Progress Notes (Signed)
Hunter [name] presented for Portacath access and flush. Proper placement of portacath confirmed by CXR. Portacath located right chest wall accessed with  H 20 needle. Good blood return present. Portacath flushed with 20ml NS and 500U/42ml Heparin and needle removed intact. Procedure without incident. Patient tolerated procedure well.

## 2024-02-07 NOTE — Patient Instructions (Signed)
[facility] - A DEPT OF [facility]  Discharge Instructions: Thank you for choosing [facility] to provide your oncology and hematology care.  If you have a lab appointment with the Cancer Center - please note that after April 8th, 2024, all labs will be drawn in the cancer center.  You do not have to check in or register with the main entrance as you have in the past but will complete your check-in in the cancer center.  Wear comfortable clothing and clothing appropriate for easy access to any Portacath or PICC line.   We strive to give you quality time with your provider. You may need to reschedule your appointment if you arrive late (15 or more minutes).  Arriving late affects you and other patients whose appointments are after yours.  Also, if you miss three or more appointments without notifying the office, you may be dismissed from the clinic at the provider's discretion.      For prescription refill requests, have your pharmacy contact our office and allow 72 hours for refills to be completed.    Today you received the following PF with labs    To help prevent nausea and vomiting after your treatment, we encourage you to take your nausea medication as directed.  BELOW ARE SYMPTOMS THAT SHOULD BE REPORTED IMMEDIATELY: *FEVER GREATER THAN 100.4 F (38 C) OR HIGHER *CHILLS OR SWEATING *NAUSEA AND VOMITING THAT IS NOT CONTROLLED WITH YOUR NAUSEA MEDICATION *UNUSUAL SHORTNESS OF BREATH *UNUSUAL BRUISING OR BLEEDING *URINARY PROBLEMS (pain or burning when urinating, or frequent urination) *BOWEL PROBLEMS (unusual diarrhea, constipation, pain near the anus) TENDERNESS IN MOUTH AND THROAT WITH OR WITHOUT PRESENCE OF ULCERS (sore throat, sores in mouth, or a toothache) UNUSUAL RASH, SWELLING OR PAIN  UNUSUAL VAGINAL DISCHARGE OR ITCHING   Items with * indicate a potential emergency and should be followed up as soon as possible or go to the  Emergency Department if any problems should occur.  Please show the CHEMOTHERAPY ALERT CARD or IMMUNOTHERAPY ALERT CARD at check-in to the Emergency Department and triage nurse.  Should you have questions after your visit or need to cancel or reschedule your appointment, please contact [facility] [place] - A DEPT OF Eligha Brideg[facility] [phone]  and follow the prompts.  Office hours are 8:00 a.m. to 4:30 p.m. Monday - Friday. Please note that voicemails left after 4:00 p.m. may not be returned until the following business day.  We are closed weekends and major holidays. You have access to a nurse at all times for urgent questions. Please call the main number to the clinic [phone] and follow the prompts.  For any non-urgent questions, you may also contact your provider using MyChart. We now offer e-Visits for anyone 69 and older to request care online for non-urgent symptoms. For details visit [email or web address].   Also download the MyChart app! Go to the app store, search "MyChart", open the app, select [facility], and log in with your MyChart username and password.

## 2024-02-08 LAB — PSA: Prostatic Specific Antigen: 0.01 ng/mL (ref 0.00–4.00)

## 2024-02-22 ENCOUNTER — Ambulatory Visit: Payer: Medicare Other | Attending: Cardiovascular Disease | Admitting: Cardiovascular Disease

## 2024-02-22 ENCOUNTER — Encounter: Payer: Self-pay | Admitting: Cardiovascular Disease

## 2024-02-22 VITALS — BP 96/60 | HR 61 | Ht 69.0 in | Wt 172.0 lb

## 2024-02-22 DIAGNOSIS — E785 Hyperlipidemia, unspecified: Secondary | ICD-10-CM

## 2024-02-22 DIAGNOSIS — Z8739 Personal history of other diseases of the musculoskeletal system and connective tissue: Secondary | ICD-10-CM | POA: Diagnosis present

## 2024-02-22 DIAGNOSIS — M109 Gout, unspecified: Secondary | ICD-10-CM

## 2024-02-22 DIAGNOSIS — I10 Essential (primary) hypertension: Secondary | ICD-10-CM

## 2024-02-22 DIAGNOSIS — I480 Paroxysmal atrial fibrillation: Secondary | ICD-10-CM | POA: Diagnosis present

## 2024-02-22 DIAGNOSIS — N1832 Chronic kidney disease, stage 3b: Secondary | ICD-10-CM

## 2024-02-22 DIAGNOSIS — I2510 Atherosclerotic heart disease of native coronary artery without angina pectoris: Secondary | ICD-10-CM

## 2024-02-22 MED ORDER — METOPROLOL SUCCINATE ER 50 MG PO TB24: 50.0000 mg | ORAL_TABLET | Freq: Every day | ORAL | 3 refills | Status: AC

## 2024-02-22 MED ORDER — ALLOPURINOL 100 MG PO TABS: 100.0000 mg | ORAL_TABLET | Freq: Every day | ORAL | 3 refills | Status: AC

## 2024-02-22 NOTE — Patient Instructions (Signed)
 Medication Instructions:  - START taking allopurinol 100mg  (1 tab) daily - START taking metoprolol succinate 50mg  (1 tab) daily - STOP taking metoprolol tartrate  *If you need a refill on your cardiac medications before your next appointment, please call your pharmacy*   Lab Work: - None ordered   Testing/Procedures: - None ordered   Follow-Up: At [facility], you and your health needs are our priority.  As part of our continuing mission to provide you with exceptional heart care, we have created designated Provider Care Teams.  These Care Teams include your primary Cardiologist (physician) and Advanced Practice Providers (APPs -  Physician Assistants and Nurse Practitioners) who all work together to provide you with the care you need, when you need it.  We recommend signing up for the patient portal called "MyChart".  Sign up information is provided on this After Visit Summary.  MyChart is used to connect with patients for Virtual Visits (Telemedicine).  Patients are able to view lab/test results, encounter notes, upcoming appointments, etc.  Non-urgent messages can be sent to your provider as well.   To learn more about what you can do with MyChart, go to [email or web address].    Your next appointment:   1 year(s)  Provider:   Thurmon Fair, MD

## 2024-02-22 NOTE — Progress Notes (Signed)
 Date:  02/26/2024   ID:  Hunter [name], DOB 08/06/1946, MRN [id number]  PCP:  Vanessa Remer, FNP  Cardiologist:   Thurmon Fair, MD   Chief Complaint  Patient presents with   Atrial Fibrillation   Coronary Artery Disease    History of Present Illness:  Hunter [name] is a 78 y.o. male with coronary artery disease s/p CABG in 2013, complicated by transitory postoperative atrial fibrillation, with subsequent recurrence of atrial fibrillation during critical illness and electrolyte imbalances in November 2021 and again in January 2025. He has hypertension and hyperlipidemia CKD stage IIIb, and a history of urinary cystectomy for bladder cancer, with right lower quadrant urostomy.  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  During hospitalization following emergency surgery for incarcerated incisional hernia with small bowel obstruction in early January 2025 he developed transient atrial fibrillation with rapid ventricular response.  He was prescribed amiodarone and Eliquis, but stopped taking both medications on hospital discharge.  He has never been aware of the atrial arrhythmia (not in 2013, 2021 or 2025).  He has a previous history of hematuria while undergoing chemotherapy for bladder cancer.  He has not had hematuria recently.  He lost about 50 pounds gradually over the last few years by walking on a regular basis and pain attention to his diet.  He has managed to keep the extra weight off.  His blood pressure today is borderline low at 96/60 he describes occasional orthostatic dizziness, but he has not experienced syncope.  His only antihypertensive medication at this time is metoprolol 50 mg twice daily  He is lost a lot of weight gradually over the last few years, even before having his urological problems.  He is now very close to ideal body mass index at 25.  He walks on a regular basis.  He watches his diet carefully.  The patient  specifically denies any chest pain at rest or with exertion, dyspnea at rest or with exertion, orthopnea, paroxysmal nocturnal dyspnea, syncope, palpitations, focal neurological deficits, intermittent claudication, lower extremity edema, unexplained weight gain, cough, hemoptysis or wheezing.  Echocardiogram performed 01/01/2024 during his last hospitalization shows normal left ventricular systolic function and regional wall motion and no significant valve abnormalities.  Past Medical History:  Diagnosis Date   Atrial fibrillation, rapid (HCC)    With high ventricular rates. 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 (LIMA-LAD; VG-OM1, OM2, VG-PD), EF 55-60% at cath   Degenerative disc disease, lumbar    Dysrhythmia    Epigastric pain    Myoview 11/09/12 Showed evidence of reversible ischemia in both the inferior wall & the anteroapical distribution.    Erectile dysfunction    H/O ED while taking a beta-blocker   GERD (gastroesophageal reflux disease)    Gout    Hiatal hernia    History of kidney stones    Hyperlipidemia    Hypertension    Systemic HTN. Pt has H/O of ED when taking a beta-blocker. ECHO 11/26/08 Showed no evidence of any significant valvular disease, Estimated EF = 50-60%.   Mononeuritis of unspecified site    Nocturia    NSVT (nonsustained ventricular tachycardia) (HCC)    Obesity    Osteoarthritis 08/2019   right knee; cortisone  shot    PAF (paroxysmal atrial fibrillation) (HCC)    Post CABG. Amia intol. 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, LIMA-LAD;VG-OM1,OM2;VG-PD 12/15/2012   Seasonal allergies    Skin cancer (melanoma) (HCC) 08/04/2015   left side of face    Skin lesion of face     hx of    Past Surgical History:  Procedure Laterality Date   ANTERIOR CERVICAL DECOMPRESSION/DISCECTOMY FUSION 4 LEVELS N/A 04/11/2023   Procedure: Cervical three-four Cervical four-five Cervical five-six Cervical six-seven Anterior Cervical Decompression Fusion;  Surgeon: Barnett Abu, MD;  Location: [facility] OR;  Service: Neurosurgery;  Laterality: N/A;   COLONOSCOPY     CORONARY ARTERY BYPASS GRAFT  12/12/2012   CABG x 4 using left internal mammary artery and right endovein harvest. (LIMA-LAD; VG-OM1, OM2, VG-PD)   CYSTOSCOPY WITH INJECTION N/A 02/04/2021   Procedure: CYSTOSCOPY WITH INJECTION OF INDOCYANINE GREEN DYE;  Surgeon: Sebastian Ache, MD;  Location: [facility];  Service: Urology;  Laterality: N/A;   EYE SURGERY  1996   had lasik surgery bilaterally   IR IMAGING GUIDED PORT INSERTION  09/10/2020   LEFT HEART CATHETERIZATION WITH CORONARY ANGIOGRAM N/A 12/08/2012   Procedure: LEFT HEART CATHETERIZATION WITH CORONARY ANGIOGRAM;  Surgeon: Thurmon Fair, MD;  Location: [facility] CATH LAB;  Service: Cardiovascular;  Laterality: N/A;   LYMPHADENECTOMY Bilateral 02/04/2021   Procedure: LYMPHADENECTOMY;  Surgeon: Sebastian Ache, MD;  Location: [facility];  Service: Urology;  Laterality: Bilateral;   ROBOT ASSISTED LAPAROSCOPIC COMPLETE CYSTECT ILEAL CONDUIT N/A 02/04/2021   Procedure: XI ROBOTIC ASSISTED LAPAROSCOPIC COMPLETE CYSTOPROSTATECTOMY ILEAL CONDUIT;  Surgeon: Sebastian Ache, MD;  Location: [facility];  Service: Urology;  Laterality: N/A;  6 HRS   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   SKIN CANCER EXCISION  2016   left side of face    TRANSURETHRAL RESECTION OF BLADDER TUMOR N/A 11/16/2019   Procedure: TRANSURETHRAL RESECTION OF BLADDER TUMOR (TURBT) WITH INSTILLATION OF POST OPERATIVE CHEMOTHERAPY;  Surgeon: Ihor Gully, MD;  Location: [facility];  Service: Urology;  Laterality: N/A;   TRANSURETHRAL RESECTION OF BLADDER TUMOR WITH MITOMYCIN-C N/A 08/12/2020   Procedure: TRANSURETHRAL  RESECTION OF BLADDER TUMOR WITH GEMCITABINE;  Surgeon: Noel Christmas, MD;  Location: [facility];  Service: Urology;  Laterality: N/A;  1 HR   UPPER GI ENDOSCOPY     XI ROBOTIC ASSISTED PARASTOMAL HERNIA REPAIR N/A 12/29/2023   Procedure: XI ROBOTIC ASSISTED PARASTOMAL HERNIA REPAIR WITH MESH, INCISIONAL AND ILEAL CONDUIT, LYSIS OF ADHESIONS, BILATERAL TAP BLOCK;  Surgeon: Karie Soda, MD;  Location: [facility];  Service: General;  Laterality: N/A;    Current Medications: Outpatient Medications Prior to Visit  Medication Sig Dispense Refill   ferrous sulfate 325 (65 FE) MG tablet Take 325 mg by mouth 3 (three) times a week.     polycarbophil (FIBERCON) 625 MG tablet Take 1 tablet (625 mg total) by mouth 2 (two) times daily. 60 tablet 0   simvastatin (ZOCOR) 40 MG tablet TAKE 1 TABLET BY MOUTH EVERYDAY AT BEDTIME 90 tablet 1   allopurinol (ZYLOPRIM) 300 MG tablet Take 300 mg by mouth daily.     metoprolol tartrate (LOPRESSOR) 50 MG tablet Take 1 tablet (50 mg total) by mouth 2 (two) times daily. 180 tablet 3   acetaminophen (TYLENOL) 500 MG tablet Take 1,000 mg by  mouth every 8 (eight) hours as needed. (Patient not taking: Reported on 02/22/2024)     fluticasone (FLONASE) 50 MCG/ACT nasal spray Place 2 sprays into both nostrils daily as needed for allergies. (Patient not taking: Reported on 02/22/2024)     amiodarone (PACERONE) 200 MG tablet Take 1 tablet (200 mg total) by mouth 2 (two) times daily for 8 days, THEN 1 tablet (200 mg total) daily for 21 days. 37 tablet 0   apixaban (ELIQUIS) 5 MG TABS tablet Take 1 tablet (5 mg total) by mouth 2 (two) times daily. (Patient not taking: Reported on 02/22/2024) 60 tablet 11   HYDROcodone-acetaminophen (NORCO/VICODIN) 5-325 MG tablet Take 1-2 tablets by mouth every 4 (four) hours as needed for moderate pain (pain score 4-6) or severe pain (pain score 7-10). (Patient not taking: Reported on 02/22/2024) 30 tablet 0   methocarbamol 1000 MG TABS Take 1,000 mg by  mouth every 8 (eight) hours as needed for muscle spasms. (Patient not taking: Reported on 02/22/2024) 20 tablet 0   No facility-administered medications prior to visit.     Allergies:   Oxycodone  Family History:  The patient's family history includes AAA (abdominal aortic aneurysm) in his father; Aneurysm in his father; CAD in his father; Cancer in his mother; Heart attack in his paternal grandfather; Hypertension in his father.    PHYSICAL EXAM:   VS:  BP 96/60 (BP Location: Left Arm, Patient Position: Sitting, Cuff Size: Normal)   Pulse 61   Ht 5\' 9"  (1.753 m)   Wt 172 lb (78 kg)   SpO2 98%   BMI 25.40 kg/m      General: Alert, oriented x3, no distress Head: no evidence of trauma, PERRL, EOMI, no exophtalmos or lid lag, no myxedema, no xanthelasma; normal ears, nose and oropharynx Neck: normal jugular venous pulsations and no hepatojugular reflux; brisk carotid pulses without delay and no carotid bruits Chest: clear to auscultation, no signs of consolidation by percussion or palpation, normal fremitus, symmetrical and full respiratory excursions Cardiovascular: normal position and quality of the apical impulse, regular rhythm, normal first and second heart sounds, no murmurs, rubs or gallops Abdomen: no tenderness or distention, no masses by palpation, no abnormal pulsatility or arterial bruits, normal bowel sounds, no hepatosplenomegaly Extremities: no clubbing, cyanosis or edema; 2+ radial, ulnar and brachial pulses bilaterally; 2+ right femoral, posterior tibial and dorsalis pedis pulses; 2+ left femoral, posterior tibial and dorsalis pedis pulses; no subclavian or femoral bruits Neurological: Paretic and hypertrophic left upper extremity postpolio Psych: Normal mood and affect    Wt Readings from Last 3 Encounters:  02/22/24 172 lb (78 kg)  01/08/24 184 lb 8.4 oz (83.7 kg)  12/14/23 181 lb (82.1 kg)      Studies/Labs Reviewed:   EKG:    EKG  Interpretation Date/Time:  Wednesday February 22 2024 08:32:23 EST Ventricular Rate:  61 PR Interval:  172 QRS Duration:  82 QT Interval:  410 QTC Calculation: 412 R Axis:   -24  Text Interpretation: Normal sinus rhythm Low voltage QRS When compared with ECG of 05-Jan-2024 06:57, Sinus rhythm has replaced Atrial flutter Confirmed by Marjory Meints ([id number]) on 02/22/2024 8:38:32 AM         Recent Labs: 01/01/2024: B Natriuretic Peptide 107.4 01/02/2024: TSH 1.454 02/07/2024: ALT 12; BUN 24; Creatinine, Ser 1.79; Hemoglobin 9.4; Magnesium 2.1; Platelets 219; Potassium 3.8; Sodium 139   Lipid Panel    Component Value Date/Time   CHOL 128 09/26/2014 0921   TRIG  146 09/26/2014 0921   HDL 46 09/26/2014 0921   CHOLHDL 2.8 09/26/2014 0921   VLDL 29 09/26/2014 0921   LDLCALC 53 09/26/2014 0921   LDLDIRECT 83.9 10/26/2010 1056      ASSESSMENT:    1. Coronary artery disease involving native coronary artery of native heart without angina pectoris   2. Paroxysmal atrial fibrillation (HCC)   3. Essential hypertension   4. Gout, unspecified cause, unspecified chronicity, unspecified site   5. Dyslipidemia (high LDL; low HDL)   6. Stage 3b chronic kidney disease (HCC)   7. History of gout      PLAN:  In order of problems listed above:  CAD s/p CABG: He remains very active and is asymptomatic.  On statin.  He should restart aspirin 81 mg daily if he is not taking anticoagulants. AFib: We have documented this arrhythmia 3 times.  It occurred immediately after bypass surgery during acute illness in November 2021 and again during his most recent hospitalization for hernia and small bowel obstruction.  He is not aware of the arrhythmia and it is possible that it is happening more frequently.  He has had problems with hematuria in the past, but not recently CHA2DS2-VASc 4-5 (age 27, hypertension, +/-prediabetes, CAD).  No history of CVA/TIA.  We discussed the high risk that he may be having  asymptomatic atrial fibrillation and the need for some type of monitoring device.  He is not eager to have an implantable loop recorder, but agrees to get a smart watch that he will wear every day. HTN: Blood pressure is relatively low.  Will reduce the dose of metoprolol.  He will take metoprolol succinate 50 mg once daily HLP: On statin, his most recent lipid profile was very favorable with LDL at target (62) and borderline HDL (42) CKD 3b: Most recent creatinine 1.79, corresponding to GFR of approximately 40.  Will reduce his allopurinol to 100 mg daily. Gout: Reduce allopurinol to 100 mg daily based on his renal dysfunction.   Medication Adjustments/Labs and Tests Ordered: Current medicines are reviewed at length with the patient today.  Concerns regarding medicines are outlined above.  Medication changes, Labs and Tests ordered today are listed in the Patient Instructions below. Patient Instructions  Medication Instructions:  - START taking allopurinol 100mg  (1 tab) daily - START taking metoprolol succinate 50mg  (1 tab) daily - STOP taking metoprolol tartrate  *If you need a refill on your cardiac medications before your next appointment, please call your pharmacy*   Lab Work: - None ordered   Testing/Procedures: - None ordered   Follow-Up: At [facility], you and your health needs are our priority.  As part of our continuing mission to provide you with exceptional heart care, we have created designated Provider Care Teams.  These Care Teams include your primary Cardiologist (physician) and Advanced Practice Providers (APPs -  Physician Assistants and Nurse Practitioners) who all work together to provide you with the care you need, when you need it.  We recommend signing up for the patient portal called "MyChart".  Sign up information is provided on this After Visit Summary.  MyChart is used to connect with patients for Virtual Visits (Telemedicine).  Patients are able to  view lab/test results, encounter notes, upcoming appointments, etc.  Non-urgent messages can be sent to your provider as well.   To learn more about what you can do with MyChart, go to [email or web address].    Your next appointment:   1 year(s)  Provider:  Thurmon Fair, MD      Signed, Thurmon Fair, MD  02/26/2024 6:44 PM    [facility] [address] [place], [place], [place]  [address] Phone: [phone]; Fax: [phone]

## 2024-02-26 ENCOUNTER — Encounter: Payer: Self-pay | Admitting: Cardiovascular Disease

## 2024-02-27 ENCOUNTER — Ambulatory Visit (HOSPITAL_COMMUNITY)
Admission: RE | Admit: 2024-02-27 | Discharge: 2024-02-27 | Disposition: A | Discharge status: Home / Self Care | Payer: Medicare Other | Source: Ambulatory Visit | Attending: Hematology | Admitting: Hematology

## 2024-02-27 ENCOUNTER — Inpatient Hospital Stay: Payer: Medicare Other

## 2024-02-27 DIAGNOSIS — C679 Malignant neoplasm of bladder, unspecified: Secondary | ICD-10-CM | POA: Insufficient documentation

## 2024-02-27 LAB — POCT I-STAT CREATININE: Creatinine, Ser: 1.9 mg/dL — ABNORMAL HIGH (ref 0.61–1.24)

## 2024-02-27 MED ORDER — IOHEXOL 300 MG/ML  SOLN
75.0000 mL | Freq: Once | INTRAMUSCULAR | Status: AC | PRN
  Administered 2024-02-27: 75 mL via INTRAVENOUS

## 2024-03-05 ENCOUNTER — Inpatient Hospital Stay: Payer: Medicare Other | Admitting: Hematology

## 2024-03-08 ENCOUNTER — Inpatient Hospital Stay: Payer: Medicare Other | Attending: Oncology | Admitting: Hematology

## 2024-03-08 VITALS — BP 117/71 | HR 64 | Temp 98.3°F | Resp 19 | Ht 69.0 in | Wt 174.2 lb

## 2024-03-08 DIAGNOSIS — Z85828 Personal history of other malignant neoplasm of skin: Secondary | ICD-10-CM | POA: Insufficient documentation

## 2024-03-08 DIAGNOSIS — D508 Other iron deficiency anemias: Secondary | ICD-10-CM

## 2024-03-08 DIAGNOSIS — C679 Malignant neoplasm of bladder, unspecified: Secondary | ICD-10-CM

## 2024-03-08 DIAGNOSIS — Z8551 Personal history of malignant neoplasm of bladder: Secondary | ICD-10-CM | POA: Diagnosis present

## 2024-03-08 DIAGNOSIS — C61 Malignant neoplasm of prostate: Secondary | ICD-10-CM | POA: Diagnosis not present

## 2024-03-08 NOTE — Progress Notes (Signed)
 [facility] [address] [place] [address]    Clinic Day:  03/08/2024  Referring physician: Garald Braver, FNP  Patient Care Team: Vanessa [place], FNP as PCP - General (Family Medicine) Croitoru, Rachelle Hora, MD as PCP - Cardiology (Cardiology) Croitoru, Rachelle Hora, MD as Attending Physician (Cardiology) Mayo, Eugenio Hoes, FNP as WOC Nurse (Nurse Practitioner) Doreatha Massed, MD as Medical Oncologist (Medical Oncology) Berneice Heinrich Delbert Phenix., MD as Consulting Physician (Urology) Barnett Abu, MD as Consulting Physician (Neurosurgery) Karie Soda, MD as Consulting Physician (General Surgery)   ASSESSMENT & PLAN:   Assessment: 1.  T2 N0 high-grade urothelial bladder cancer: - TURBT in August 2021 - Neoadjuvant chemotherapy with cisplatin and gemcitabine from 09/17/2020 through November 2021, 3 cycles - Radical cystoprostatectomy and ileal conduit formation and lymph node dissection on 02/04/2021.  Pathology Y pTis ypN0   2.  Social/family history: - He lives at home with his wife.  He plays golf twice weekly.  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  Non-smoker. - Mother had colon cancer in her 79s.   3.  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  All margins of resection are negative for carcinoma.    Plan: 1.  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 Berneice Heinrich on Monday. - He denies any blood in the urine bag.  No new pains reported. - Labs from 02/07/2024: Normal LFTs.  Allman improved to 3.2.  CBC grossly normal. - CT CAP (02/27/2024): No evidence of recurrence or metastatic disease.  Nonobstructive left nephrolithiasis.  Other benign findings discussed. - We will switch him scans to once a year. - Will see him back in 6 months.   2.  Prostate cancer: - We reviewed PSA from labs on 02/07/2024, which was 0.01.   3.   Chronic renal insufficiency: - Baseline creatinine between 1.5-2.0.  Latest 1 is 1.7.   4.  Normocytic anemia: - He has normocytic anemia from CKD and functional iron deficiency. - Labs today with hemoglobin 9.4, MCV 97.  Ferritin is 228 and percent saturation 14.  Ferritin has improved.  Continue iron tablet daily. - Also discussed the role of ESA should his hemoglobin drop below 9.   5.  Port-A-Cath: - He will continue port flushes every 3 months.    Orders Placed This Encounter  Procedures   CBC with Differential    Standing Status:   Future    Expected Date:   09/03/2024    Expiration Date:   03/08/2025   Comprehensive metabolic panel    Standing Status:   Future    Expected Date:   09/03/2024    Expiration Date:   03/08/2025   PSA    Standing Status:   Future    Expected Date:   09/03/2024    Expiration Date:   03/08/2025   Iron and TIBC ([facility] ONLY)    Standing Status:   Future    Expected Date:   09/03/2024    Expiration Date:   03/08/2025   Ferritin    Standing Status:   Future    Expected Date:   09/03/2024    Expiration Date:   03/08/2025      [name],acting as a scribe for Doreatha Massed, MD.,have documented all relevant documentation on the behalf of Doreatha Massed, MD,as directed by  Doreatha Massed, MD while  in the presence of Doreatha Massed, MD.  I, Doreatha Massed MD, have reviewed the above documentation for accuracy and completeness, and I agree with the above.    Doreatha Massed, MD   3/13/202511:30 AM  CHIEF COMPLAINT:   Diagnosis: bladder cancer    Cancer Staging  No matching staging information was found for the patient.    Prior Therapy: 1. TURBT, 07/2020 2. Neoadjuvant cisplatin and gemcitabine, 3 cycles 09/17/20 - 10/2020 3. Cystoprostatectomy 02/04/21  Current Therapy:  Surveillance    HISTORY OF PRESENT ILLNESS:   Oncology History  Malignant neoplasm of urinary bladder (HCC)  09/03/2020  Initial Diagnosis   Malignant neoplasm of urinary bladder (HCC)   09/17/2020 - 11/05/2020 Chemotherapy   The patient had palonosetron (ALOXI) injection 0.25 mg, 0.25 mg, Intravenous,  Once, 3 of 3 cycles Administration: 0.25 mg (09/17/2020), 0.25 mg (10/07/2020), 0.25 mg (10/29/2020) CISplatin (PLATINOL) 100 mg in sodium chloride 0.9 % 250 mL chemo infusion, 49.5 mg/m2 = 101 mg (100 % of original dose 50 mg/m2), Intravenous,  Once, 3 of 3 cycles Dose modification: 50 mg/m2 (original dose 50 mg/m2, Cycle 1, Reason: Provider Judgment) Administration: 100 mg (09/17/2020), 100 mg (10/29/2020) gemcitabine (GEMZAR) 2,000 mg in sodium chloride 0.9 % 250 mL chemo infusion, 2,014 mg (100 % of original dose 1,000 mg/m2), Intravenous,  Once, 3 of 3 cycles Dose modification: 1,000 mg/m2 (original dose 1,000 mg/m2, Cycle 1, Reason: Provider Judgment), 800 mg/m2 (original dose 1,000 mg/m2, Cycle 2, Reason: Provider Judgment) Administration: 2,000 mg (09/17/2020), 2,000 mg (09/23/2020), 1,634 mg (10/07/2020), 1,634 mg (10/15/2020), 1,634 mg (10/29/2020), 1,634 mg (11/05/2020) fosaprepitant (EMEND) 150 mg in sodium chloride 0.9 % 145 mL IVPB, 150 mg, Intravenous,  Once, 3 of 3 cycles Administration: 150 mg (09/17/2020), 150 mg (10/07/2020), 150 mg (10/29/2020)  for chemotherapy treatment.       INTERVAL HISTORY:   Hunter [name] is a 78 y.o. male presenting to clinic today for follow up of bladder cancer. He was last seen by me on 09/05/23.  Since his last visit, Hunter [name] underwent CT C/A/P on 02/27/24 that found: Status post cystoprostatectomy with right lower quadrant ileal conduit urinary diversion. No evidence of recurrent or metastatic disease in the chest, abdomen, or pelvis. Nonobstructive left nephrolithiasis. 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 Cholelithiasis. Coronary artery disease.  He was admitted to the hospital from 12/29/23 to 01/08/24 for  incarcerated incisional hernia and small bowel obstruction. On 12/29/23, Hunter [name] underwent tranversus abdominis release component separation with unilateral mesh repair of abdominal hernia, parastomal hernia mesh repair of ileal conduit, lysis of adhesions, colon repair, and bilateral tap block. Post-op, he developed A-fib eventually controlled with IV amiodarone. Travez was then transitioned to IV heparin, then Lovenox injections, and eventually oral Eliquis. He also developed ileus requiring NG tube decompression before advancing to solid diet.   Today, he states that he is doing well overall. His appetite level is at  75%. His energy level is at 70%. Kaston saw Dr. Kathrynn Running on 03/05/24 who stated he is in remission. He is taking iron supplements daily and denies any constipation. Braden denies any chest pain.   PAST MEDICAL HISTORY:   Past Medical History: Past Medical History:  Diagnosis Date   Atrial fibrillation, rapid (HCC)    With high ventricular rates. Intol amio, was on Sotalol, now on BB   Bladder cancer (HCC) dx'd 10/2019   Bursitis of right shoulder    CAD (coronary  artery disease) 11/2012   CABG x 4 using left internal mammary artery and right endovein harvest. (LIMA-LAD; VG-OM1, OM2, VG-PD), EF 55-60% at cath   Degenerative disc disease, lumbar    Dysrhythmia    Epigastric pain    Myoview 11/09/12 Showed evidence of reversible ischemia in both the inferior wall & the anteroapical distribution.    Erectile dysfunction    H/O ED while taking a beta-blocker   GERD (gastroesophageal reflux disease)    Gout    Hiatal hernia    History of kidney stones    Hyperlipidemia    Hypertension    Systemic HTN. Pt has H/O of ED when taking a beta-blocker. ECHO 11/26/08 Showed no evidence of any significant valvular disease, Estimated EF = 50-60%.   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 Amia intol. 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, LIMA-LAD;VG-OM1,OM2;VG-PD 12/15/2012   Seasonal allergies    Skin cancer (melanoma) (HCC) 08/04/2015   left side of face    Skin lesion of face    hx of    Surgical History: Past Surgical History:  Procedure Laterality Date   ANTERIOR CERVICAL DECOMPRESSION/DISCECTOMY FUSION 4 LEVELS N/A 04/11/2023   Procedure: Cervical three-four Cervical four-five Cervical five-six Cervical six-seven Anterior Cervical Decompression Fusion;  Surgeon: Barnett Abu, MD;  Location: [facility] OR;  Service: Neurosurgery;  Laterality: N/A;   COLONOSCOPY     CORONARY ARTERY BYPASS GRAFT  12/12/2012   CABG x 4 using left internal mammary artery and right endovein harvest. (LIMA-LAD; VG-OM1, OM2, VG-PD)   CYSTOSCOPY WITH INJECTION N/A 02/04/2021   Procedure: CYSTOSCOPY WITH INJECTION OF INDOCYANINE GREEN DYE;  Surgeon: Sebastian Ache, MD;  Location: [facility];  Service: Urology;  Laterality: N/A;   EYE SURGERY  1996   had lasik surgery bilaterally   IR IMAGING GUIDED PORT INSERTION  09/10/2020   LEFT HEART CATHETERIZATION WITH CORONARY ANGIOGRAM N/A 12/08/2012   Procedure: LEFT HEART CATHETERIZATION WITH CORONARY ANGIOGRAM;  Surgeon: Thurmon Fair, MD;  Location: [facility] CATH LAB;  Service: Cardiovascular;  Laterality: N/A;   LYMPHADENECTOMY Bilateral 02/04/2021   Procedure: LYMPHADENECTOMY;  Surgeon: Sebastian Ache, MD;  Location: [facility];  Service: Urology;  Laterality: Bilateral;   ROBOT ASSISTED LAPAROSCOPIC COMPLETE CYSTECT ILEAL CONDUIT N/A 02/04/2021   Procedure: XI ROBOTIC ASSISTED LAPAROSCOPIC COMPLETE CYSTOPROSTATECTOMY ILEAL CONDUIT;  Surgeon: Sebastian Ache, MD;  Location: [facility];  Service: Urology;  Laterality: N/A;  6 HRS   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    SKIN CANCER EXCISION  2016   left side of face    TRANSURETHRAL RESECTION OF BLADDER TUMOR N/A 11/16/2019   Procedure: TRANSURETHRAL RESECTION OF BLADDER TUMOR (TURBT) WITH INSTILLATION OF POST OPERATIVE CHEMOTHERAPY;  Surgeon: Ihor Gully, MD;  Location: [facility];  Service: Urology;  Laterality: N/A;   TRANSURETHRAL RESECTION OF BLADDER TUMOR WITH MITOMYCIN-C N/A 08/12/2020   Procedure: TRANSURETHRAL RESECTION OF BLADDER TUMOR WITH GEMCITABINE;  Surgeon: Noel Christmas, MD;  Location: [facility];  Service: Urology;  Laterality: N/A;  1 HR   UPPER GI ENDOSCOPY     XI ROBOTIC ASSISTED PARASTOMAL  HERNIA REPAIR N/A 12/29/2023   Procedure: XI ROBOTIC ASSISTED PARASTOMAL HERNIA REPAIR WITH MESH, INCISIONAL AND ILEAL CONDUIT, LYSIS OF ADHESIONS, BILATERAL TAP BLOCK;  Surgeon: Karie Soda, MD;  Location: [facility];  Service: General;  Laterality: N/A;    Social History: Social History   Socioeconomic History   Marital status: Married    Spouse name: Not on file   Number of children: 3   Years of education: Not on file   Highest education level: Not on file  Occupational History   Occupation: Musician (RETIRED)    Employer: [employer]  Tobacco Use   Smoking status: Never   Smokeless tobacco: Never  Vaping Use   Vaping status: Never Used  Substance and Sexual Activity   Alcohol use: Yes    Comment: 1 wine or 1 margarita once a month   Drug use: No   Sexual activity: Not on file  Other Topics Concern   Not on file  Social History Narrative   Not on file   Social Drivers of Health   Financial Resource Strain: Not on file  Food Insecurity: No Food Insecurity (12/29/2023)   Hunger Vital Sign    Worried About Running Out of Food in the Last Year: Never true    Ran Out of Food in the Last Year: Never true  Transportation Needs: No Transportation Needs (12/29/2023)   PRAPARE - Administrator, Civil Service (Medical): No    Lack of Transportation (Non-Medical): No   Physical Activity: Not on file  Stress: Not on file  Social Connections: Unknown (12/29/2023)   Social Connection and Isolation Panel [NHANES]    Frequency of Communication with Friends and Family: More than three times a week    Frequency of Social Gatherings with Friends and Family: Once a week    Attends Religious Services: Patient unable to answer    Active Member of Clubs or Organizations: No    Attends Banker Meetings: Never    Marital Status: Married  Catering manager Violence: Not At Risk (12/29/2023)   Humiliation, Afraid, Rape, and Kick questionnaire    Fear of Current or Ex-Partner: No    Emotionally Abused: No    Physically Abused: No    Sexually Abused: No    Family History: Family History  Problem Relation Age of Onset   CAD Father    Hypertension Father    AAA (abdominal aortic aneurysm) Father    Aneurysm Father        Cause of death   Heart attack Paternal Grandfather    Cancer Mother        Colon. Cause of death    Current Medications:  Current Outpatient Medications:    allopurinol (ZYLOPRIM) 100 MG tablet, Take 1 tablet (100 mg total) by mouth daily., Disp: 90 tablet, Rfl: 3   ferrous sulfate 325 (65 FE) MG tablet, Take 325 mg by mouth daily., Disp: , Rfl:    metoprolol succinate (TOPROL XL) 50 MG 24 hr tablet, Take 1 tablet (50 mg total) by mouth daily. Take with or immediately following a meal., Disp: 90 tablet, Rfl: 3   simvastatin (ZOCOR) 40 MG tablet, TAKE 1 TABLET BY MOUTH EVERYDAY AT BEDTIME, Disp: 90 tablet, Rfl: 1   Allergies: Allergies  Allergen Reactions   Oxycodone Other (See Comments)    Bad dreams and hallucinations    REVIEW OF SYSTEMS:   Review of Systems  Constitutional:  Negative for chills, fatigue and fever.  HENT:  Negative for lump/mass, mouth sores, nosebleeds, sore throat and trouble swallowing.   Eyes:  Negative for eye problems.  Respiratory:  Negative for cough and shortness of breath.   Cardiovascular:   Negative for chest pain, leg swelling and palpitations.       +right foot swelling  Gastrointestinal:  Negative for abdominal pain, constipation, diarrhea, nausea and vomiting.  Genitourinary:  Negative for bladder incontinence, difficulty urinating, dysuria, frequency, hematuria and nocturia.   Musculoskeletal:  Negative for arthralgias, back pain, flank pain, myalgias and neck pain.  Skin:  Negative for itching and rash.  Neurological:  Negative for dizziness, headaches and numbness.  Hematological:  Does not bruise/bleed easily.  Psychiatric/Behavioral:  Negative for depression, sleep disturbance and suicidal ideas. The patient is not nervous/anxious.   All other systems reviewed and are negative.    VITALS:   Blood pressure 117/71, pulse 64, temperature 98.3 F (36.8 C), temperature source Oral, resp. rate 19, height 5\' 9"  (1.753 m), weight 174 lb 3.2 oz (79 kg), SpO2 99%.  Wt Readings from Last 3 Encounters:  03/08/24 174 lb 3.2 oz (79 kg)  02/22/24 172 lb (78 kg)  01/08/24 184 lb 8.4 oz (83.7 kg)    Body mass index is 25.72 kg/m.  Performance status (ECOG): 1 - Symptomatic but completely ambulatory  PHYSICAL EXAM:   Physical Exam Vitals and nursing note reviewed. Exam conducted with a chaperone present.  Constitutional:      Appearance: Normal appearance.  Cardiovascular:     Rate and Rhythm: Normal rate and regular rhythm.     Pulses: Normal pulses.     Heart sounds: Normal heart sounds.  Pulmonary:     Effort: Pulmonary effort is normal.     Breath sounds: Normal breath sounds.  Abdominal:     Palpations: Abdomen is soft. There is no hepatomegaly, splenomegaly or mass.     Tenderness: There is no abdominal tenderness.  Musculoskeletal:     Right lower leg: No edema.     Left lower leg: No edema.  Lymphadenopathy:     Cervical: No cervical adenopathy.     Right cervical: No superficial, deep or posterior cervical adenopathy.    Left cervical: No superficial,  deep or posterior cervical adenopathy.     Upper Body:     Right upper body: No supraclavicular or axillary adenopathy.     Left upper body: No supraclavicular or axillary adenopathy.  Neurological:     General: No focal deficit present.     Mental Status: He is alert and oriented to person, place, and time.  Psychiatric:        Mood and Affect: Mood normal.        Behavior: Behavior normal.     LABS:      Latest Ref Rng & Units 02/07/2024    1:51 PM 01/07/2024    1:26 AM 01/06/2024    5:36 AM  CBC  WBC 4.0 - 10.5 K/uL 7.0  9.6  10.3   Hemoglobin 13.0 - 17.0 g/dL 9.4  9.2  16.1   Hematocrit 39.0 - 52.0 % 29.6  28.2  34.8   Platelets 150 - 400 K/uL 219  250  305       Latest Ref Rng & Units 02/27/2024   11:02 AM 02/07/2024    1:51 PM 01/08/2024    3:35 AM  CMP  Glucose 70 - 99 mg/dL  97  096   BUN 8 - 23 mg/dL  24  34  Creatinine 0.61 - 1.24 mg/dL 4.09  8.11  9.14   Sodium 135 - 145 mmol/L  139  137   Potassium 3.5 - 5.1 mmol/L  3.8  3.6   Chloride 98 - 111 mmol/L  107  110   CO2 22 - 32 mmol/L  21  21   Calcium 8.9 - 10.3 mg/dL  8.7  7.6   Total Protein 6.5 - 8.1 g/dL  7.0    Total Bilirubin 0.0 - 1.2 mg/dL  0.5    Alkaline Phos 38 - 126 U/L  79    AST 15 - 41 U/L  18    ALT 0 - 44 U/L  12       No results found for: "CEA1", "CEA" / No results found for: "CEA1", "CEA" Lab Results  Component Value Date   PSA1 <0.1 01/17/2023   No results found for: "NWG956" No results found for: "CAN125"  No results found for: "TOTALPROTELP", "ALBUMINELP", "A1GS", "A2GS", "BETS", "BETA2SER", "GAMS", "MSPIKE", "SPEI" Lab Results  Component Value Date   TIBC 266 02/07/2024   TIBC 227 (L) 01/05/2024   TIBC 324 09/05/2023   FERRITIN 228 02/07/2024   FERRITIN 191 01/05/2024   FERRITIN 64 09/05/2023   IRONPCTSAT 14 (L) 02/07/2024   IRONPCTSAT 19 01/05/2024   IRONPCTSAT 24 09/05/2023   No results found for: "LDH"   STUDIES:   CT CHEST ABDOMEN PELVIS W CONTRAST Result Date:  03/03/2024 CLINICAL DATA:  Bladder cancer restaging, status post cystoprostatectomy and right lower quadrant ileal conduit urinary diversion * Tracking Code: BO * EXAM: CT CHEST, ABDOMEN, AND PELVIS WITH CONTRAST TECHNIQUE: Multidetector CT imaging of the chest, abdomen and pelvis was performed following the standard protocol during bolus administration of intravenous contrast. RADIATION DOSE REDUCTION: This exam was performed according to the departmental dose-optimization program which includes automated exposure control, adjustment of the mA and/or kV according to patient size and/or use of iterative reconstruction technique. CONTRAST:  75mL OMNIPAQUE IOHEXOL 300 MG/ML  SOLN COMPARISON:  08/09/2022 FINDINGS: CT CHEST FINDINGS Cardiovascular: Right chest port catheter. Aortic atherosclerosis. Normal heart size. Three-vessel coronary artery calcifications status post median sternotomy and CABG. No pericardial effusion. Mediastinum/Nodes: No enlarged mediastinal, hilar, or axillary lymph nodes. Small hiatal hernia. Thyroid gland, trachea, and esophagus demonstrate no significant findings. Lungs/Pleura: Diffuse bilateral bronchial wall thickening. No pleural effusion or pneumothorax. Musculoskeletal: No chest wall abnormality. No acute osseous findings. CT ABDOMEN PELVIS FINDINGS Hepatobiliary: No solid liver abnormality is seen. Small gallstones. No gallbladder wall thickening or biliary ductal dilatation. Pancreas: Unremarkable. No pancreatic ductal dilatation or surrounding inflammatory changes. Spleen: Normal in size without significant abnormality. Adrenals/Urinary Tract: Adrenal glands are unremarkable. Status post cystoprostatectomy with right lower quadrant ileal conduit urinary diversion. Small nonobstructive left renal calculi. Simple, benign bilateral renal cortical cysts. No hydronephrosis. Stomach/Bowel: Stomach is within normal limits. Appendix appears normal. No evidence of bowel wall thickening,  distention, or inflammatory changes. Sigmoid diverticulosis. Vascular/Lymphatic: Aortic atherosclerosis. 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 No enlarged abdominal or pelvic lymph nodes. Reproductive: No status post prostatectomy. Other: Evidence of prior midline ventral hernia repair with small postoperative seroma (series 2, image 63). Small postoperative seroma associated with right lower quadrant stoma (series 2, image 93). No ascites. Musculoskeletal: No acute osseous findings. Disc degenerative disease and bridging osteophytosis throughout the thoracic spine, in keeping with DISH. IMPRESSION: 1. Status post cystoprostatectomy with right lower quadrant ileal  conduit urinary diversion. 2. No evidence of recurrent or metastatic disease in the chest, abdomen, or pelvis. 3. Nonobstructive left nephrolithiasis. 4. 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 5. Cholelithiasis. 6. Coronary artery disease. Aortic Atherosclerosis (ICD10-I70.0). Electronically Signed   By: Jearld Lesch M.D.   On: 03/03/2024 15:43

## 2024-03-08 NOTE — Patient Instructions (Signed)
 [facility] at [facility] Discharge Instructions   You were seen and examined today by Dr. Ellin Saba.  He reviewed the results of your lab work which are normal/stable.   He reviewed the results of your scan which did not show any evidence of cancer.   We will see you back in 6 months. We will repeat lab work prior to this visit.   Return as scheduled.    Thank you for choosing [facility] at [facility] to provide your oncology and hematology care.  To afford each patient quality time with our provider, please arrive at least 15 minutes before your scheduled appointment time.   If you have a lab appointment with the Cancer Center please come in thru the Main Entrance and check in at the main information desk.  You need to re-schedule your appointment should you arrive 10 or more minutes late.  We strive to give you quality time with our providers, and arriving late affects you and other patients whose appointments are after yours.  Also, if you no show three or more times for appointments you may be dismissed from the clinic at the providers discretion.     Again, thank you for choosing [facility].  Our hope is that these requests will decrease the amount of time that you wait before being seen by our physicians.       _____________________________________________________________  Should you have questions after your visit to [facility], please contact our office at [phone] and follow the prompts.  Our office hours are 8:00 a.m. and 4:30 p.m. Monday - Friday.  Please note that voicemails left after 4:00 p.m. may not be returned until the following business day.  We are closed weekends and major holidays.  You do have access to a nurse 24-7, just call the main number to the clinic [phone] and do not press any options, hold on the line and a nurse will answer the phone.    For prescription refill requests,  have your pharmacy contact our office and allow 72 hours.    Due to Covid, you will need to wear a mask upon entering the hospital. If you do not have a mask, a mask will be given to you at the Main Entrance upon arrival. For doctor visits, patients may have 1 support person age 69 or older with them. For treatment visits, patients can not have anyone with them due to social distancing guidelines and our immunocompromised population.

## 2024-03-28 ENCOUNTER — Telehealth: Payer: Self-pay | Admitting: Emergency Medicine

## 2024-03-28 MED ORDER — ASPIRIN 81 MG PO TBEC: 81.0000 mg | DELAYED_RELEASE_TABLET | Freq: Every day | ORAL | Status: DC

## 2024-03-28 NOTE — Telephone Encounter (Signed)
 Croitoru, Rachelle Hora, MD  Scheryl Marten, RN Can we please make sure that aspirin 81 mg daily is on his list of medications and that he knows he needs to take it?  It was stopped when he was started on Eliquis, but he has discontinued the anticoagulant.  Thank you  Left the information above on voicemail.  Also spoke with spouse- will update dpr at next appointment. But told of message sent on mychart and left on voicemail  She reports that her spouse has been wearing an Apple watch and has not had any irregular heart rates.

## 2024-05-28 ENCOUNTER — Inpatient Hospital Stay: Attending: Oncology

## 2024-05-28 DIAGNOSIS — Z8551 Personal history of malignant neoplasm of bladder: Secondary | ICD-10-CM | POA: Insufficient documentation

## 2024-05-28 MED ORDER — HEPARIN SOD (PORK) LOCK FLUSH 100 UNIT/ML IV SOLN
500.0000 [IU] | Freq: Once | INTRAVENOUS | Status: AC
  Administered 2024-05-28: 500 [IU] via INTRAVENOUS

## 2024-05-28 MED ORDER — SODIUM CHLORIDE 0.9% FLUSH
10.0000 mL | Freq: Once | INTRAVENOUS | Status: AC
  Administered 2024-05-28: 10 mL via INTRAVENOUS

## 2024-05-28 NOTE — Progress Notes (Signed)
 Patients port flushed without difficulty.  Good blood return noted with no bruising or swelling noted at site.  Band aid applied.  VSS with discharge and left in satisfactory condition with no s/s of distress noted.

## 2024-07-22 ENCOUNTER — Other Ambulatory Visit: Payer: Self-pay | Admitting: Cardiovascular Disease

## 2024-07-30 ENCOUNTER — Telehealth: Payer: Self-pay | Admitting: Cardiovascular Disease

## 2024-07-30 NOTE — Telephone Encounter (Signed)
 Pt c/o of Chest Pain: STAT if active (IN THIS MOMENT) CP, including tightness, pressure, jaw pain, shoulder/upper arm/back pain, SOB, nausea, and vomiting.  1. Are you having CP right now (tightness, pressure, or discomfort)? No   2. Are you experiencing any other symptoms (ex. SOB, nausea, vomiting, sweating)? No   3. How long have you been experiencing CP? No   4. Is your CP continuous or coming and going? Coming and going   5. Have you taken Nitroglycerin ? No   6. If CP returns before callback, please consider calling 911. ?

## 2024-07-30 NOTE — Telephone Encounter (Signed)
 He is 12 years out from CABG, could well have graft disease. He should be seen. Thanks!

## 2024-07-30 NOTE — Telephone Encounter (Signed)
 Pt reports chest tightness- He had hernia repair in January. Wondering if it could be related to that?  Chest tightness on and off since March- this morning it got a little worse, but once he started moving around it seemed to get better? Tightness/discomfort is worse when he first starts activity, but then seems to ease up after moving for a bit. He denies any other symptoms that go along with the pain.   App appt made.   If you experience active Chest Pain, including tightness, pressure, jaw pain, radiating pain to shoulder/upper arm/back,  Shortness Of Breath, nausea, vomiting, and/or sweating- THEN CALL 911 AND GO TO THE NEAREST EMERGENCY ROOM  He verbalized understanding of all information. Informed of new address.

## 2024-08-09 ENCOUNTER — Other Ambulatory Visit: Payer: Self-pay

## 2024-08-09 NOTE — Progress Notes (Signed)
 Cardiology Office Note   Date:  08/10/2024  ID:  Linc Renne, DOB Oct 08, 1946, MRN [id number] PCP: Paola Greig CROME, FNP  [facility] Providers Cardiologist:  Jerel Balding, MD   History of Present Illness Hunter [name] is a 78 y.o. male with coronary artery disease status post CABG in 2013, complicated by transitory postoperative atrial fibrillation with subsequent recurrence of atrial fibrillation during critical illness and electrolyte imbalances in November 2021 and again in January 2025.  He has hypertension and hyperlipidemia, CKD stage IIIb, and history of urinary cystectomy for bladder cancer with right lower quadrant urostomy.  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  During hospitalization following emergent surgery for incarcerated incisional hernia with small bowel obstruction in early January 2025 he developed transient A-fib with rapid ventricular response.  Prescribed amiodarone  and Eliquis  but stopped taking both medications on hospital discharge.  Has not been aware of his atrial arrhythmia.  Previous history of hematuria while undergoing chemotherapy for bladder cancer.  Has not had any hematuria recently.  Has lost about 50 pounds gradually over the last few years by walking on regular basis and pain attention to his diet.  Has managed to keep the weight off.  Blood pressure was borderline 96/60 and he was explaining occasional orthostatic dizziness but no syncope.  His only antihypertensive medications were metoprolol  50 mg twice daily.  His gradual weight loss over the last few years has resulted in an ideal body mass index of 25.  He walks on a regular basis and watches his diet carefully.  Patient specifically denies any chest pain on exertion, dyspnea at rest or with exertion, orthopnea, PND, syncope, palpitations, focal neurologic deficits, intermittent claudication, lower extremity edema, unexplained weight loss,  cough, hemoptysis, or wheezing.  Echo performed 01/01/2024 during last hospitalization showed normal LVEF and regional wall motion with no significant valvular abnormalities.  Today, he presents with coronary artery disease and atrial fibrillation for cardiovascular follow-up.  He underwent a coronary bypass graft in December 2013 and has not experienced recent chest pain or dyspnea, except for an episode of increased heart rate in June while in [place] . His heart rate returned to normal shortly after. He is on metoprolol  succinate 50 mg once daily, aspirin , simvastatin  at bedtime, and allopurinol , which was reduced from 300 mg to 100 mg daily.  He has atrial fibrillation with a noted episode in June where his heart rate increased to 88 bpm but resolved quickly. He continues on metoprolol  succinate 50 mg daily.  He experiences weekly swelling in his foot, more pronounced on one side, without recent orthopedic injuries. 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 His right knee appears larger than the left.  Reports no shortness of breath nor dyspnea on exertion. Reports no chest pain, pressure, or tightness. No edema, orthopnea, PND. Reports no palpitations.   Discussed the use of AI scribe software for clinical note transcription with the patient, who gave verbal consent to proceed.  ROS: pertinent ROS in HPI  Studies Reviewed EKG Interpretation Date/Time:  Friday August 10 2024 14:34:14 EDT Ventricular Rate:  67 PR Interval:  166 QRS Duration:  86 QT Interval:  380 QTC Calculation: 401 R Axis:   -26  Text Interpretation: Normal sinus rhythm Cannot rule out Inferior infarct (cited on or before 10-Aug-2024) When compared with ECG of 22-Feb-2024 08:32, No significant change was found Confirmed by Lucien Blanc [phone]) on 08/10/2024 2:47:16 PM   Echo 01/01/24 IMPRESSIONS  1. Left ventricular ejection fraction, by estimation, is 60 to 65%. The  left ventricle has normal  function. Left ventricular endocardial border  not optimally defined to evaluate regional wall motion. Left ventricular  diastolic parameters are  indeterminate.   2. Right ventricular systolic function was not well visualized. The right  ventricular size is not well visualized.   3. The mitral valve is normal in structure. No evidence of mitral valve  regurgitation. No evidence of mitral stenosis.   4. The aortic valve is tricuspid. Aortic valve regurgitation is not  visualized. No aortic stenosis is present.   5. Aortic dilatation noted. There is mild dilatation of the ascending  aorta, measuring 37 mm.   Risk Assessment/Calculations  CHA2DS2-VASc Score = 4  This indicates a 4.8% annual risk of stroke. The patient's score is based upon: CHF History: 0 HTN History: 1 Diabetes History: 0 Stroke History: 0 Vascular Disease History: 1 Age Score: 2 Gender Score: 0       Physical Exam VS:  BP 120/76   Pulse 67   Ht 5' 9 (1.753 m)   Wt 181 lb (82.1 kg)   SpO2 97%   BMI 26.73 kg/m        Wt Readings from Last 3 Encounters:  08/10/24 181 lb (82.1 kg)  05/28/24 177 lb (80.3 kg)  03/08/24 174 lb 3.2 oz (79 kg)    GEN: Well nourished, well developed in no acute distress NECK: No JVD; No carotid bruits CARDIAC: RRR, no murmurs, rubs, gallops RESPIRATORY:  Clear to auscultation without rales, wheezing or rhonchi  ABDOMEN: Soft, non-tender, non-distended EXTREMITIES:  No edema; No deformity   ASSESSMENT AND PLAN  Atrial fibrillation, status post coronary artery bypass grafting Normal sinus rhythm on EKG. Recent episode resolved spontaneously. Metoprolol  effectively controls heart rate. Echocardiogram showed normal cardiac function. - Continue metoprolol  succinate 50 mg daily. - Continue aspirin  daily. - Continue simvastatin  at bedtime.  Chronic right lower extremity swelling Chronic swelling likely due to fluid retention. No immediate concern for thrombosis. - Prescribe  Lasix  20 mg as needed for swelling, especially during periods of increased humidity.  Gout, controlled on allopurinol  Gout well-controlled on allopurinol  with no recent flare-ups. - Continue allopurinol  100 mg daily.  CAD status post CABG -continue current medications -no chest pains  HTN -well controlled continue current medications  CKD 3B -avoid nephrotoxic medications     Dispo: She can follow-up in a year  Signed, Orren LOISE Fabry, PA-C

## 2024-08-10 ENCOUNTER — Ambulatory Visit: Attending: Physician Assistant | Admitting: Physician Assistant

## 2024-08-10 VITALS — BP 120/76 | HR 67 | Ht 69.0 in | Wt 181.0 lb

## 2024-08-10 DIAGNOSIS — I480 Paroxysmal atrial fibrillation: Secondary | ICD-10-CM | POA: Diagnosis present

## 2024-08-10 DIAGNOSIS — I10 Essential (primary) hypertension: Secondary | ICD-10-CM | POA: Diagnosis present

## 2024-08-10 DIAGNOSIS — E785 Hyperlipidemia, unspecified: Secondary | ICD-10-CM | POA: Diagnosis present

## 2024-08-10 DIAGNOSIS — I2510 Atherosclerotic heart disease of native coronary artery without angina pectoris: Secondary | ICD-10-CM | POA: Diagnosis present

## 2024-08-10 DIAGNOSIS — M109 Gout, unspecified: Secondary | ICD-10-CM | POA: Insufficient documentation

## 2024-08-10 DIAGNOSIS — N1832 Chronic kidney disease, stage 3b: Secondary | ICD-10-CM | POA: Insufficient documentation

## 2024-08-10 MED ORDER — FUROSEMIDE 20 MG PO TABS: ORAL_TABLET | ORAL | 3 refills | Status: AC

## 2024-08-10 NOTE — Patient Instructions (Signed)
 Medication Instructions:  Lasix  20 mg daily as needed only.  *If you need a refill on your cardiac medications before your next appointment, please call your pharmacy*  Lab Work: NONE ordered at this time of appointment   Testing/Procedures: NONE ordered at this time of appointment   Follow-Up: At [facility], you and your health needs are our priority.  As part of our continuing mission to provide you with exceptional heart care, our providers are all part of one team.  This team includes your primary Cardiologist (physician) and Advanced Practice Providers or APPs (Physician Assistants and Nurse Practitioners) who all work together to provide you with the care you need, when you need it.  Your next appointment:   1 year(s)  Provider:   Jerel Balding, MD    We recommend signing up for the patient portal called MyChart.  Sign up information is provided on this After Visit Summary.  MyChart is used to connect with patients for Virtual Visits (Telemedicine).  Patients are able to view lab/test results, encounter notes, upcoming appointments, etc.  Non-urgent messages can be sent to your provider as well.   To learn more about what you can do with MyChart, go to [email or web address].

## 2024-08-31 ENCOUNTER — Other Ambulatory Visit: Payer: Self-pay | Admitting: *Deleted

## 2024-09-04 ENCOUNTER — Inpatient Hospital Stay

## 2024-09-05 ENCOUNTER — Inpatient Hospital Stay: Attending: Oncology

## 2024-09-05 DIAGNOSIS — G8929 Other chronic pain: Secondary | ICD-10-CM | POA: Insufficient documentation

## 2024-09-05 DIAGNOSIS — C679 Malignant neoplasm of bladder, unspecified: Secondary | ICD-10-CM

## 2024-09-05 DIAGNOSIS — N189 Chronic kidney disease, unspecified: Secondary | ICD-10-CM | POA: Insufficient documentation

## 2024-09-05 DIAGNOSIS — D508 Other iron deficiency anemias: Secondary | ICD-10-CM | POA: Insufficient documentation

## 2024-09-05 DIAGNOSIS — C61 Malignant neoplasm of prostate: Secondary | ICD-10-CM | POA: Insufficient documentation

## 2024-09-05 DIAGNOSIS — Z8551 Personal history of malignant neoplasm of bladder: Secondary | ICD-10-CM | POA: Insufficient documentation

## 2024-09-05 DIAGNOSIS — M25552 Pain in left hip: Secondary | ICD-10-CM | POA: Insufficient documentation

## 2024-09-05 NOTE — Addendum Note (Signed)
 Addended by: JENELLE DOROTHYANN PARAS on: 09/05/2024 12:01 PM   Modules accepted: Orders

## 2024-09-05 NOTE — Progress Notes (Signed)
 Patient here today for PF labs. Accessed port, no blood return noted, no resistance met. Patient wants to reschedule for next week, and if not blood return will alteplase per protocol.    Vitals stable and discharged home from clinic ambulatory. Follow up as scheduled.

## 2024-09-10 ENCOUNTER — Inpatient Hospital Stay

## 2024-09-10 DIAGNOSIS — N189 Chronic kidney disease, unspecified: Secondary | ICD-10-CM | POA: Diagnosis not present

## 2024-09-10 DIAGNOSIS — M25552 Pain in left hip: Secondary | ICD-10-CM | POA: Diagnosis not present

## 2024-09-10 DIAGNOSIS — C679 Malignant neoplasm of bladder, unspecified: Secondary | ICD-10-CM

## 2024-09-10 DIAGNOSIS — D508 Other iron deficiency anemias: Secondary | ICD-10-CM | POA: Diagnosis not present

## 2024-09-10 DIAGNOSIS — G8929 Other chronic pain: Secondary | ICD-10-CM | POA: Diagnosis not present

## 2024-09-10 DIAGNOSIS — Z8551 Personal history of malignant neoplasm of bladder: Secondary | ICD-10-CM | POA: Diagnosis present

## 2024-09-10 DIAGNOSIS — C61 Malignant neoplasm of prostate: Secondary | ICD-10-CM

## 2024-09-10 LAB — COMPREHENSIVE METABOLIC PANEL WITH GFR
ALT: 19 U/L (ref 0–44)
AST: 20 U/L (ref 15–41)
Albumin: 3.5 g/dL (ref 3.5–5.0)
Alkaline Phosphatase: 89 U/L (ref 38–126)
Anion gap: 6 (ref 5–15)
BUN: 28 mg/dL — ABNORMAL HIGH (ref 8–23)
CO2: 20 mmol/L — ABNORMAL LOW (ref 22–32)
Calcium: 8.5 mg/dL — ABNORMAL LOW (ref 8.9–10.3)
Chloride: 109 mmol/L (ref 98–111)
Creatinine, Ser: 1.77 mg/dL — ABNORMAL HIGH (ref 0.61–1.24)
GFR, Estimated: 39 mL/min — ABNORMAL LOW (ref >60)
Glucose, Bld: 96 mg/dL (ref 70–99)
Potassium: 4.2 mmol/L (ref 3.5–5.1)
Sodium: 135 mmol/L (ref 135–145)
Total Bilirubin: 0.6 mg/dL (ref 0.0–1.2)
Total Protein: 6.6 g/dL (ref 6.5–8.1)

## 2024-09-10 LAB — IRON AND TIBC
Iron: 80 ug/dL (ref 45–182)
Saturation Ratios: 24 % (ref 17.9–39.5)
TIBC: 331 ug/dL (ref 250–450)
UIBC: 251 ug/dL

## 2024-09-10 LAB — CBC WITH DIFFERENTIAL/PLATELET
Abs Immature Granulocytes: 0.01 K/uL (ref 0.00–0.07)
Basophils Absolute: 0 K/uL (ref 0.0–0.1)
Basophils Relative: 1 %
Eosinophils Absolute: 0.3 K/uL (ref 0.0–0.5)
Eosinophils Relative: 4 %
HCT: 38.5 % — ABNORMAL LOW (ref 39.0–52.0)
Hemoglobin: 12.9 g/dL — ABNORMAL LOW (ref 13.0–17.0)
Immature Granulocytes: 0 %
Lymphocytes Relative: 23 %
Lymphs Abs: 1.5 K/uL (ref 0.7–4.0)
MCH: 31.8 pg (ref 26.0–34.0)
MCHC: 33.5 g/dL (ref 30.0–36.0)
MCV: 94.8 fL (ref 80.0–100.0)
Monocytes Absolute: 0.4 K/uL (ref 0.1–1.0)
Monocytes Relative: 7 %
Neutro Abs: 4.2 K/uL (ref 1.7–7.7)
Neutrophils Relative %: 65 %
Platelets: 144 K/uL — ABNORMAL LOW (ref 150–400)
RBC: 4.06 MIL/uL — ABNORMAL LOW (ref 4.22–5.81)
RDW: 13.7 % (ref 11.5–15.5)
WBC: 6.4 K/uL (ref 4.0–10.5)
nRBC: 0 % (ref 0.0–0.2)

## 2024-09-10 LAB — FERRITIN: Ferritin: 61 ng/mL (ref 24–336)

## 2024-09-10 NOTE — Progress Notes (Signed)
 Patients port flushed without difficulty.  Good blood return noted with no bruising or swelling noted at site. Labs drawn per orders. Gazue dressing applied.  VSS with discharge and left in satisfactory condition with no s/s of distress noted. All follow ups as scheduled.       Jadelyn Elks

## 2024-09-11 ENCOUNTER — Inpatient Hospital Stay: Admitting: Oncology

## 2024-09-25 ENCOUNTER — Inpatient Hospital Stay (HOSPITAL_BASED_OUTPATIENT_CLINIC_OR_DEPARTMENT_OTHER): Admitting: Oncology

## 2024-09-25 VITALS — BP 139/70 | HR 68 | Temp 98.0°F | Resp 18 | Wt 180.0 lb

## 2024-09-25 DIAGNOSIS — C679 Malignant neoplasm of bladder, unspecified: Secondary | ICD-10-CM

## 2024-09-25 DIAGNOSIS — C61 Malignant neoplasm of prostate: Secondary | ICD-10-CM

## 2024-09-25 DIAGNOSIS — N189 Chronic kidney disease, unspecified: Secondary | ICD-10-CM | POA: Diagnosis not present

## 2024-09-25 DIAGNOSIS — D508 Other iron deficiency anemias: Secondary | ICD-10-CM

## 2024-09-25 DIAGNOSIS — Z8551 Personal history of malignant neoplasm of bladder: Secondary | ICD-10-CM | POA: Diagnosis not present

## 2024-09-25 NOTE — Progress Notes (Signed)
 [facility] OFFICE PROGRESS NOTE  Paola Greig CROME, FNP  ASSESSMENT & PLAN:    Assessment & Plan Malignant neoplasm of urinary bladder, unspecified site [facility]) - He has seen Dr. Alvaro on Monday. - He denies any blood in the urine bag.  No new pains reported. - Labs from 02/07/2024: Normal LFTs.  Allman improved to 3.2.  CBC grossly normal. - CT CAP (02/27/2024): No evidence of recurrence or metastatic disease.  Nonobstructive left nephrolithiasis.  Other benign findings discussed. - We will switch him scans to once a year. - Will see him back in 6 months. Chronic kidney disease, unspecified CKD stage He is currently not followed by nephrology.  Baseline kidney function over the past 6 months been between 1.77-1.9. Will refer to nephrology for evaluation.  Patient is agreeable. Other iron deficiency anemia He is currently taking iron supplements daily. There has been a significant improvement of his hemoglobin, ferritin and iron saturations since his last visit. Recommend he continue iron supplements but may reduce to every other day.  With vitamin C He denies any melena, hematochezia or bright red blood per rectum. Prostate cancer [facility]) Followed by urology.  Most recent PSA was 0.01.  Orders Placed This Encounter  Procedures   CT CHEST ABDOMEN PELVIS W CONTRAST    Standing Status:   Future    Expected Date:   03/25/2025    Expiration Date:   09/25/2025    If indicated for the ordered procedure, I authorize the administration of contrast media per Radiology protocol:   Yes    Does the patient have a contrast media/X-ray dye allergy?:   No    Preferred imaging location?:   [facility]    If indicated for the ordered procedure, I authorize the administration of oral contrast media per Radiology protocol:   Yes   Comprehensive metabolic panel    Standing Status:   Future    Expected Date:   03/25/2025    Expiration Date:   06/23/2025   CBC with Differential     Standing Status:   Future    Expected Date:   03/25/2025    Expiration Date:   06/23/2025   Ferritin    Standing Status:   Future    Expected Date:   03/25/2025    Expiration Date:   06/23/2025   Iron and TIBC ([facility] ONLY)    Standing Status:   Future    Expected Date:   03/25/2025    Expiration Date:   06/23/2025    INTERVAL HISTORY: Patient returns for follow-up for history of bladder cancer.  Reports overall doing well and 100% energy levels are 75%.  Patient was hospitalized for 9 days in January due to incarcerated incisional hernia and required surgery at [facility].  Laparoscopic underlay repair with mesh placement Surgeon was Dr. Sheldon.  Surgery was complicated by ileus requiring NG tube decompression.  Reports hip pain 5 out of 10 on his left side which is chronic.  He still has his urostomy blood in his urine.  He is followed by urology annually.  We reviewed CMP, CBC, ferritin and iron panel.  SUMMARY OF HEMATOLOGIC HISTORY: Oncology History  Malignant neoplasm of urinary bladder (HCC)  09/03/2020 Initial Diagnosis   Malignant neoplasm of urinary bladder (HCC)   09/17/2020 - 11/05/2020 Chemotherapy   The patient had palonosetron  (ALOXI ) injection 0.25 mg, 0.25 mg, Intravenous,  Once, 3 of 3 cycles Administration: 0.25 mg (09/17/2020),  0.25 mg (10/07/2020), 0.25 mg (10/29/2020) CISplatin  (PLATINOL ) 100 mg in sodium chloride  0.9 % 250 mL chemo infusion, 49.5 mg/m2 = 101 mg (100 % of original dose 50 mg/m2), Intravenous,  Once, 3 of 3 cycles Dose modification: 50 mg/m2 (original dose 50 mg/m2, Cycle 1, Reason: Provider Judgment) Administration: 100 mg (09/17/2020), 100 mg (10/29/2020) gemcitabine  (GEMZAR ) 2,000 mg in sodium chloride  0.9 % 250 mL chemo infusion, 2,014 mg (100 % of original dose 1,000 mg/m2), Intravenous,  Once, 3 of 3 cycles Dose modification: 1,000 mg/m2 (original dose 1,000 mg/m2, Cycle 1, Reason: Provider Judgment), 800 mg/m2  (original dose 1,000 mg/m2, Cycle 2, Reason: Provider Judgment) Administration: 2,000 mg (09/17/2020), 2,000 mg (09/23/2020), 1,634 mg (10/07/2020), 1,634 mg (10/15/2020), 1,634 mg (10/29/2020), 1,634 mg (11/05/2020) fosaprepitant  (EMEND) 150 mg in sodium chloride  0.9 % 145 mL IVPB, 150 mg, Intravenous,  Once, 3 of 3 cycles Administration: 150 mg (09/17/2020), 150 mg (10/07/2020), 150 mg (10/29/2020)  for chemotherapy treatment.       CBC    Component Value Date/Time   WBC 6.4 09/10/2024 1024   RBC 4.06 (L) 09/10/2024 1024   HGB 12.9 (L) 09/10/2024 1024   HGB 12.2 (L) 09/10/2022 1429   HCT 38.5 (L) 09/10/2024 1024   PLT 144 (L) 09/10/2024 1024   PLT 137 (L) 09/10/2022 1429   MCV 94.8 09/10/2024 1024   MCH 31.8 09/10/2024 1024   MCHC 33.5 09/10/2024 1024   RDW 13.7 09/10/2024 1024   LYMPHSABS 1.5 09/10/2024 1024   MONOABS 0.4 09/10/2024 1024   EOSABS 0.3 09/10/2024 1024   BASOSABS 0.0 09/10/2024 1024       Latest Ref Rng & Units 09/10/2024   10:24 AM 02/27/2024   11:02 AM 02/07/2024    1:51 PM  CMP  Glucose 70 - 99 mg/dL 96   97   BUN 8 - 23 mg/dL 28   24   Creatinine 9.38 - 1.24 mg/dL 8.22  8.09  8.20   Sodium 135 - 145 mmol/L 135   139   Potassium 3.5 - 5.1 mmol/L 4.2   3.8   Chloride 98 - 111 mmol/L 109   107   CO2 22 - 32 mmol/L 20   21   Calcium  8.9 - 10.3 mg/dL 8.5   8.7   Total Protein 6.5 - 8.1 g/dL 6.6   7.0   Total Bilirubin 0.0 - 1.2 mg/dL 0.6   0.5   Alkaline Phos 38 - 126 U/L 89   79   AST 15 - 41 U/L 20   18   ALT 0 - 44 U/L 19   12      Lab Results  Component Value Date   FERRITIN 61 09/10/2024   VITAMINB12 390 01/05/2024    Vitals:   09/25/24 1113  BP: 139/70  Pulse: 68  Resp: 18  Temp: 98 F (36.7 C)  SpO2: 100%    Review of System:  Review of Systems  Constitutional:  Positive for malaise/fatigue.    Physical Exam: Physical Exam Constitutional:      Appearance: Normal appearance.  HENT:     Head: Normocephalic and atraumatic.  Eyes:      Pupils: Pupils are equal, round, and reactive to light.  Cardiovascular:     Rate and Rhythm: Normal rate and regular rhythm.     Heart sounds: Normal heart sounds. No murmur heard. Pulmonary:     Effort: Pulmonary effort is normal.     Breath sounds: Normal breath sounds.  No wheezing.  Abdominal:     General: Bowel sounds are normal. There is no distension.     Palpations: Abdomen is soft.     Tenderness: There is no abdominal tenderness.      Comments: Urostomy in place.  Stoma is beefy red.  Musculoskeletal:        General: Normal range of motion.     Cervical back: Normal range of motion.  Skin:    General: Skin is warm and dry.     Findings: No rash.  Neurological:     Mental Status: He is alert and oriented to person, place, and time.     Gait: Gait is intact.  Psychiatric:        Mood and Affect: Mood and affect normal.        Cognition and Memory: Memory normal.        Judgment: Judgment normal.      I spent 25 minutes dedicated to the care of this patient (face-to-face and non-face-to-face) on the date of the encounter to include what is described in the assessment and plan.,  Delon Hope, NP 09/25/2024 12:46 PM

## 2024-09-25 NOTE — Assessment & Plan Note (Addendum)
-   He has seen Dr. Alvaro on Monday. - He denies any blood in the urine bag.  No new pains reported. - Labs from 02/07/2024: Normal LFTs.  Allman improved to 3.2.  CBC grossly normal. - CT CAP (02/27/2024): No evidence of recurrence or metastatic disease.  Nonobstructive left nephrolithiasis.  Other benign findings discussed. - We will switch him scans to once a year. - Will see him back in 6 months.

## 2024-09-25 NOTE — Assessment & Plan Note (Signed)
 He is currently not followed by nephrology.  Baseline kidney function over the past 6 months been between 1.77-1.9. Will refer to nephrology for evaluation.  Patient is agreeable.

## 2024-11-29 ENCOUNTER — Inpatient Hospital Stay: Attending: Oncology

## 2024-11-29 NOTE — Progress Notes (Signed)
 Patients port flushed without difficulty.  Good blood return noted with no bruising or swelling noted at site.  Band aid applied.  VSS with discharge and left in satisfactory condition with no s/s of distress noted.

## 2024-12-24 ENCOUNTER — Encounter: Payer: Self-pay | Admitting: *Deleted

## 2024-12-29 ENCOUNTER — Other Ambulatory Visit: Payer: Self-pay

## 2024-12-29 ENCOUNTER — Emergency Department (HOSPITAL_COMMUNITY)

## 2024-12-29 ENCOUNTER — Emergency Department (HOSPITAL_COMMUNITY)
Admission: EM | Admit: 2024-12-29 | Discharge: 2024-12-29 | Disposition: A | Discharge status: Home / Self Care | Attending: Emergency Medicine | Admitting: Emergency Medicine

## 2024-12-29 ENCOUNTER — Encounter (HOSPITAL_COMMUNITY): Payer: Self-pay | Admitting: *Deleted

## 2024-12-29 DIAGNOSIS — R310 Gross hematuria: Secondary | ICD-10-CM | POA: Insufficient documentation

## 2024-12-29 DIAGNOSIS — R319 Hematuria, unspecified: Secondary | ICD-10-CM | POA: Diagnosis present

## 2024-12-29 LAB — COMPREHENSIVE METABOLIC PANEL WITH GFR
ALT: 18 U/L (ref 0–44)
AST: 22 U/L (ref 15–41)
Albumin: 3.9 g/dL (ref 3.5–5.0)
Alkaline Phosphatase: 98 U/L (ref 38–126)
Anion gap: 10 (ref 5–15)
BUN: 31 mg/dL — ABNORMAL HIGH (ref 8–23)
CO2: 25 mmol/L (ref 22–32)
Calcium: 8.8 mg/dL — ABNORMAL LOW (ref 8.9–10.3)
Chloride: 104 mmol/L (ref 98–111)
Creatinine, Ser: 1.8 mg/dL — ABNORMAL HIGH (ref 0.61–1.24)
GFR, Estimated: 38 mL/min — ABNORMAL LOW
Glucose, Bld: 78 mg/dL (ref 70–99)
Potassium: 3.7 mmol/L (ref 3.5–5.1)
Sodium: 138 mmol/L (ref 135–145)
Total Bilirubin: 0.5 mg/dL (ref 0.0–1.2)
Total Protein: 7.1 g/dL (ref 6.5–8.1)

## 2024-12-29 LAB — CBC WITH DIFFERENTIAL/PLATELET
Abs Immature Granulocytes: 0.15 K/uL — ABNORMAL HIGH (ref 0.00–0.07)
Basophils Absolute: 0.1 K/uL (ref 0.0–0.1)
Basophils Relative: 1 %
Eosinophils Absolute: 0.2 K/uL (ref 0.0–0.5)
Eosinophils Relative: 2 %
HCT: 42.6 % (ref 39.0–52.0)
Hemoglobin: 14.2 g/dL (ref 13.0–17.0)
Immature Granulocytes: 1 %
Lymphocytes Relative: 21 %
Lymphs Abs: 2.2 K/uL (ref 0.7–4.0)
MCH: 31.4 pg (ref 26.0–34.0)
MCHC: 33.3 g/dL (ref 30.0–36.0)
MCV: 94.2 fL (ref 80.0–100.0)
Monocytes Absolute: 0.7 K/uL (ref 0.1–1.0)
Monocytes Relative: 7 %
Neutro Abs: 7.1 K/uL (ref 1.7–7.7)
Neutrophils Relative %: 68 %
Platelets: 216 K/uL (ref 150–400)
RBC: 4.52 MIL/uL (ref 4.22–5.81)
RDW: 13.3 % (ref 11.5–15.5)
WBC: 10.5 K/uL (ref 4.0–10.5)
nRBC: 0 % (ref 0.0–0.2)

## 2024-12-29 LAB — URINALYSIS, ROUTINE W REFLEX MICROSCOPIC
Bilirubin Urine: NEGATIVE
Glucose, UA: NEGATIVE mg/dL
Ketones, ur: NEGATIVE mg/dL
Nitrite: NEGATIVE
Protein, ur: 100 mg/dL — AB
Specific Gravity, Urine: 1.012 (ref 1.005–1.030)
pH: 8 (ref 5.0–8.0)

## 2024-12-29 MED ORDER — SODIUM CHLORIDE 0.9 % IV SOLN
2.0000 g | Freq: Once | INTRAVENOUS | Status: AC
  Administered 2024-12-29: 2 g via INTRAVENOUS
  Filled 2024-12-29: qty 20

## 2024-12-29 MED ORDER — CEPHALEXIN 500 MG PO CAPS: 500.0000 mg | ORAL_CAPSULE | Freq: Four times a day (QID) | ORAL | 0 refills | Status: AC

## 2024-12-29 NOTE — Discharge Instructions (Signed)
Follow-up with your urologist this week. °

## 2024-12-29 NOTE — ED Triage Notes (Signed)
 Pt with blood in urine, noted this morning, denies any pain. Denies any N/V or chills.

## 2024-12-30 NOTE — ED Provider Notes (Signed)
 " [place] EMERGENCY DEPARTMENT AT [facility] Provider Note   CSN: [id number] Arrival date & time: 12/29/24  1504     Patient presents with: Hematuria   Hunter [name] is a 79 y.o. male.   Patient complains of blood in his urostomy bag  The history is provided by the patient and medical records. No language interpreter was used.  Hematuria This is a new problem. The current episode started 6 to 12 hours ago. The problem occurs constantly. The problem has not changed since onset.Pertinent negatives include no chest pain, no abdominal pain and no headaches. Nothing aggravates the symptoms. Nothing relieves the symptoms. He has tried nothing for the symptoms.       Prior to Admission medications  Medication Sig Start Date End Date Taking? Authorizing Provider  cephALEXin  (KEFLEX ) 500 MG capsule Take 1 capsule (500 mg total) by mouth 4 (four) times daily. 12/29/24  Yes Syona Wroblewski, MD  allopurinol  (ZYLOPRIM ) 100 MG tablet Take 1 tablet (100 mg total) by mouth daily. 02/22/24   Croitoru, Mihai, MD  clobetasol cream (TEMOVATE) 0.05 % Apply topically. 08/15/24   [provider]  furosemide  (LASIX ) 20 MG tablet Take 1 tablet daily as needed only for weight gain or swelling 08/10/24   Lucien Orren SAILOR, PA-C  metoprolol  succinate (TOPROL  XL) 50 MG 24 hr tablet Take 1 tablet (50 mg total) by mouth daily. Take with or immediately following a meal. 02/22/24   Croitoru, Mihai, MD  simvastatin  (ZOCOR ) 40 MG tablet TAKE 1 TABLET BY MOUTH EVERYDAY AT BEDTIME 07/23/24   Croitoru, Mihai, MD    Allergies: Oxycodone     Review of Systems  Constitutional:  Negative for appetite change and fatigue.  HENT:  Negative for congestion, ear discharge and sinus pressure.   Eyes:  Negative for discharge.  Respiratory:  Negative for cough.   Cardiovascular:  Negative for chest pain.  Gastrointestinal:  Negative for abdominal pain and diarrhea.  Genitourinary:  Positive for hematuria. Negative  for frequency.  Musculoskeletal:  Negative for back pain.  Skin:  Negative for rash.  Neurological:  Negative for seizures and headaches.  Psychiatric/Behavioral:  Negative for hallucinations.     Updated Vital Signs BP 123/62   Pulse 61   Temp 98.2 F (36.8 C) (Oral)   Resp 20   Ht 5' 9 (1.753 m)   Wt 80.3 kg   SpO2 99%   BMI 26.14 kg/m   Physical Exam Vitals and nursing note reviewed.  Constitutional:      Appearance: He is well-developed.  HENT:     Head: Normocephalic.     Mouth/Throat:     Mouth: Mucous membranes are moist.  Eyes:     General: No scleral icterus.    Conjunctiva/sclera: Conjunctivae normal.  Neck:     Thyroid: No thyromegaly.  Cardiovascular:     Rate and Rhythm: Normal rate and regular rhythm.     Heart sounds: No murmur heard.    No friction rub. No gallop.  Pulmonary:     Breath sounds: No stridor. No wheezing or rales.  Chest:     Chest wall: No tenderness.  Abdominal:     General: There is no distension.     Tenderness: There is no abdominal tenderness. There is no rebound.  Musculoskeletal:        General: Normal range of motion.     Cervical back: Neck supple.  Lymphadenopathy:     Cervical: No cervical adenopathy.  Skin:  Findings: No erythema or rash.  Neurological:     Mental Status: He is alert and oriented to person, place, and time.     Motor: No abnormal muscle tone.     Coordination: Coordination normal.  Psychiatric:        Behavior: Behavior normal.     (all labs ordered are listed, but only abnormal results are displayed) Labs Reviewed  CBC WITH DIFFERENTIAL/PLATELET - Abnormal; Notable for the following components:      Result Value   Abs Immature Granulocytes 0.15 (*)    All other components within normal limits  COMPREHENSIVE METABOLIC PANEL WITH GFR - Abnormal; Notable for the following components:   BUN 31 (*)    Creatinine, Ser 1.80 (*)    Calcium  8.8 (*)    GFR, Estimated 38 (*)    All other  components within normal limits  URINALYSIS, ROUTINE W REFLEX MICROSCOPIC - Abnormal; Notable for the following components:   Color, Urine AMBER (*)    APPearance HAZY (*)    Hgb urine dipstick LARGE (*)    Protein, ur 100 (*)    Leukocytes,Ua MODERATE (*)    Bacteria, UA RARE (*)    All other components within normal limits  URINE CULTURE    EKG: None  Radiology: DG Chest 2 View Result Date: 12/29/2024 EXAM: 2 VIEW(S) XRAY OF THE CHEST 12/29/2024 04:33:00 PM COMPARISON: Comparison with 01/01/2024. CLINICAL HISTORY: cough cough FINDINGS: LINES, TUBES AND DEVICES: Power port-type central venous catheter with tip over the lower SVC region. LUNGS AND PLEURA: Infiltration or atelectasis in the left lung base is improving since the prior study. Right lung is clear. Emphysematous changes in the lungs. No pleural effusion or pneumothorax. HEART AND MEDIASTINUM: Postoperative changes in the mediastinum. Heart size and pulmonary vascularity are normal. Calcified and tortuous aorta. BONES AND SOFT TISSUES: Postoperative changes in the cervical spine and left shoulder. Degenerative changes in the spine. No acute osseous abnormality. IMPRESSION: 1. Improving infiltration or atelectasis in the left lung base since the prior study. 2. Emphysematous changes in the lungs. Electronically signed by: Elsie Gravely MD 12/29/2024 05:08 PM EST RP Workstation: [id number]   CT Renal Stone Study Result Date: 12/29/2024 CLINICAL DATA:  Hematuria. EXAM: CT ABDOMEN AND PELVIS WITHOUT CONTRAST TECHNIQUE: Multidetector CT imaging of the abdomen and pelvis was performed following the standard protocol without IV contrast. RADIATION DOSE REDUCTION: This exam was performed according to the departmental dose-optimization program which includes automated exposure control, adjustment of the mA and/or kV according to patient size and/or use of iterative reconstruction technique. COMPARISON:  02/27/2024. FINDINGS: Lower chest: No  acute findings. Heart is at the upper limits of normal in size. Atherosclerotic calcification of the aorta. No pericardial or pleural effusion. Distal esophagus is grossly unremarkable. Hepatobiliary: Liver is grossly unremarkable. Small gallstones. No biliary ductal dilatation. Pancreas: Negative. Spleen: Negative. Adrenals/Urinary Tract: Adrenal glands are unremarkable. Low-attenuation lesions in the kidneys. No specific follow-up necessary. Left renal stones measure up to 7 mm. Ureters are decompressed. Cysto prostatectomy with a right lower quadrant ileal conduit. Stomach/Bowel: Tiny hiatal hernia. Ileal resection for urinary diversion. Moderate stool burden. Stomach, small bowel, appendix and colon are otherwise unremarkable. Vascular/Lymphatic: Atherosclerotic calcification of the aorta. No pathologically enlarged lymph nodes. Reproductive: Cysto prostatectomy. Other: Small bilateral inguinal hernias contain fat.  No free fluid. Musculoskeletal: Degenerative changes in the spine. IMPRESSION: 1. No findings to explain the patient's hematuria. 2. 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 3. Left renal stones. 4. Cholelithiasis. 5.  Aortic atherosclerosis (ICD10-I70.0). Electronically Signed   By: Newell Eke M.D.   On: 12/29/2024 16:44     Procedures   Medications Ordered in the ED  cefTRIAXone  (ROCEPHIN ) 2 g in sodium chloride  0.9 % 100 mL IVPB (0 g Intravenous Stopped 12/29/24 1826)                                    Medical Decision Making Amount and/or Complexity of Data Reviewed Labs: ordered. Radiology: ordered.  Risk Prescription drug management.   Patient with urinary tract infection.  He is started on Keflex  and will follow-up with his urologist     Final diagnoses:  Gross hematuria    ED Discharge Orders          Ordered    cephALEXin  (KEFLEX ) 500 MG capsule  4 times daily        12/29/24 1900               Suzette Pac, MD 12/30/24 1634  "

## 2024-12-31 LAB — URINE CULTURE

## 2025-03-19 ENCOUNTER — Other Ambulatory Visit (HOSPITAL_COMMUNITY)

## 2025-03-19 ENCOUNTER — Inpatient Hospital Stay

## 2025-03-19 ENCOUNTER — Other Ambulatory Visit

## 2025-03-26 ENCOUNTER — Inpatient Hospital Stay: Admitting: Oncology

## 2025-03-26 ENCOUNTER — Ambulatory Visit: Admitting: Oncology
